# Patient Record
Sex: Female | Born: 1990
Health system: Southern US, Community
[De-identification: ages and names within clinical notes are randomized; demographics above are authoritative.]

## PROBLEM LIST (undated history)

## (undated) ENCOUNTER — Inpatient Hospital Stay (HOSPITAL_COMMUNITY): Payer: Self-pay

## (undated) DIAGNOSIS — G43909 Migraine, unspecified, not intractable, without status migrainosus: Secondary | ICD-10-CM

## (undated) DIAGNOSIS — F329 Major depressive disorder, single episode, unspecified: Secondary | ICD-10-CM

## (undated) DIAGNOSIS — R51 Headache: Secondary | ICD-10-CM

## (undated) DIAGNOSIS — D219 Benign neoplasm of connective and other soft tissue, unspecified: Secondary | ICD-10-CM

## (undated) DIAGNOSIS — R160 Hepatomegaly, not elsewhere classified: Secondary | ICD-10-CM

## (undated) DIAGNOSIS — O139 Gestational [pregnancy-induced] hypertension without significant proteinuria, unspecified trimester: Secondary | ICD-10-CM

## (undated) DIAGNOSIS — F32A Depression, unspecified: Secondary | ICD-10-CM

## (undated) DIAGNOSIS — E0500 Thyrotoxicosis with diffuse goiter without thyrotoxic crisis or storm: Secondary | ICD-10-CM

## (undated) DIAGNOSIS — R519 Headache, unspecified: Secondary | ICD-10-CM

## (undated) DIAGNOSIS — F419 Anxiety disorder, unspecified: Secondary | ICD-10-CM

## (undated) HISTORY — DX: Gestational (pregnancy-induced) hypertension without significant proteinuria, unspecified trimester: O13.9

## (undated) HISTORY — DX: Depression, unspecified: F32.A

## (undated) HISTORY — DX: Benign neoplasm of connective and other soft tissue, unspecified: D21.9

## (undated) HISTORY — PX: INDUCED ABORTION: SHX677

## (undated) HISTORY — DX: Major depressive disorder, single episode, unspecified: F32.9

## (undated) HISTORY — PX: CHOLECYSTECTOMY: SHX55

## (undated) HISTORY — DX: Anxiety disorder, unspecified: F41.9

## (undated) HISTORY — PX: RESECTION LIVER: SUR1244

## (undated) HISTORY — DX: Headache: R51

## (undated) HISTORY — DX: Hepatomegaly, not elsewhere classified: R16.0

## (undated) HISTORY — DX: Headache, unspecified: R51.9

---
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
## 2011-03-25 ENCOUNTER — Emergency Department (HOSPITAL_COMMUNITY)
Admission: EM | Admit: 2011-03-25 | Discharge: 2011-03-25 | Disposition: A | Discharge status: Home / Self Care | Payer: Medicaid - Out of State | Attending: Emergency Medicine | Admitting: Emergency Medicine

## 2011-03-25 ENCOUNTER — Encounter (HOSPITAL_COMMUNITY): Payer: Self-pay | Admitting: *Deleted

## 2011-03-25 DIAGNOSIS — K5289 Other specified noninfective gastroenteritis and colitis: Secondary | ICD-10-CM | POA: Insufficient documentation

## 2011-03-25 DIAGNOSIS — R51 Headache: Secondary | ICD-10-CM | POA: Insufficient documentation

## 2011-03-25 DIAGNOSIS — R000 Tachycardia, unspecified: Secondary | ICD-10-CM | POA: Insufficient documentation

## 2011-03-25 DIAGNOSIS — K529 Noninfective gastroenteritis and colitis, unspecified: Secondary | ICD-10-CM

## 2011-03-25 DIAGNOSIS — E860 Dehydration: Secondary | ICD-10-CM | POA: Insufficient documentation

## 2011-03-25 LAB — POCT I-STAT, CHEM 8
BUN: 7 mg/dL (ref 6–23)
Calcium, Ion: 1.21 mmol/L (ref 1.12–1.32)
HCT: 40 % (ref 36.0–46.0)
Hemoglobin: 13.6 g/dL (ref 12.0–15.0)
Sodium: 139 mEq/L (ref 135–145)
TCO2: 24 mmol/L (ref 0–100)

## 2011-03-25 LAB — URINALYSIS, ROUTINE W REFLEX MICROSCOPIC
Bilirubin Urine: NEGATIVE
Glucose, UA: NEGATIVE mg/dL
Ketones, ur: NEGATIVE mg/dL
Leukocytes, UA: NEGATIVE
Nitrite: NEGATIVE
Protein, ur: NEGATIVE mg/dL
Specific Gravity, Urine: 1.013 (ref 1.005–1.030)
Urobilinogen, UA: 0.2 mg/dL (ref 0.0–1.0)
pH: 5.5 (ref 5.0–8.0)

## 2011-03-25 LAB — URINE MICROSCOPIC-ADD ON

## 2011-03-25 LAB — POCT PREGNANCY, URINE: Preg Test, Ur: NEGATIVE

## 2011-03-25 MED ORDER — ACETAMINOPHEN 500 MG PO TABS
1000.0000 mg | ORAL_TABLET | Freq: Once | ORAL | Status: AC
Start: 2011-03-25 — End: 2011-03-25
  Administered 2011-03-25: 1000 mg via ORAL
  Filled 2011-03-25: qty 2

## 2011-03-25 MED ORDER — SODIUM CHLORIDE 0.9 % IV BOLUS (SEPSIS)
2000.0000 mL | Freq: Once | INTRAVENOUS | Status: AC
  Administered 2011-03-25: 2000 mL via INTRAVENOUS

## 2011-03-25 MED ORDER — ONDANSETRON HCL 4 MG PO TABS: 8.0000 mg | ORAL_TABLET | Freq: Two times a day (BID) | ORAL | Status: AC | PRN

## 2011-03-25 NOTE — ED Provider Notes (Signed)
History     CSN: [id number]  Arrival date & time 03/25/11  1224   First MD Initiated Contact with Patient 03/25/11 1306      Chief Complaint  Patient presents with  . Emesis  . Weakness  . Dizziness  . Headache    (Consider location/radiation/quality/duration/timing/severity/associated sxs/prior treatment) HPI Complains of vomiting and diarrhea diffuse myalgias lightheadedness onset 3 days ago. No known fever. Admits to mild headache no other complaint patient reports "I think I have a stomach virus". No treatment prior to coming here. No known fever admits to mild headache and slight diffuse crampy abdominal pain. Patient vomited one time in the emergency department not nauseated as I examine her. Vomitus is yellowish she denies blood per him    History reviewed. No pertinent past medical history. Past medical history negative History reviewed. No pertinent past surgical history.  No family history on file.  History  Substance Use Topics  . Smoking status: Never Smoker   . Smokeless tobacco: Not on file  . Alcohol Use: No    OB History    Grav Para Term Preterm Abortions TAB SAB Ect Mult Living                  Review of Systems  Constitutional: Positive for fatigue.  Respiratory: Negative.   Cardiovascular: Negative.   Gastrointestinal: Positive for nausea, vomiting, abdominal pain and diarrhea. Negative for blood in stool.  Musculoskeletal: Negative.   Skin: Negative.   Neurological: Positive for light-headedness and headaches.  Hematological: Negative.   Psychiatric/Behavioral: Negative.   All other systems reviewed and are negative.    Allergies  Review of patient's allergies indicates no known allergies.  Home Medications  No current outpatient prescriptions on file.  BP 139/66  Pulse 123  Temp(Src) 99.7 F (37.6 C) (Oral)  Resp 24  Wt 191 lb (86.637 kg)  SpO2 99%  LMP 03/22/2011  Physical Exam  Nursing note and vitals  reviewed. Constitutional: She appears well-developed and well-nourished.  HENT:  Head: Normocephalic and atraumatic.  Eyes: Conjunctivae are normal. Pupils are equal, round, and reactive to light.  Neck: Neck supple. No tracheal deviation present. No thyromegaly present.  Cardiovascular: Normal rate and regular rhythm.   No murmur heard.      Tachycardic  Pulmonary/Chest: Effort normal and breath sounds normal.  Abdominal: Soft. Bowel sounds are normal. She exhibits no distension. There is no tenderness.  Musculoskeletal: Normal range of motion. She exhibits no edema and no tenderness.  Neurological: She is alert. Coordination normal.  Skin: Skin is warm and dry. No rash noted.  Psychiatric: She has a normal mood and affect.    ED Course  Procedures (including critical care time) 4:30 PM patient alert ambulates without difficulty feels well not lightheaded on standing no longer nauseated Is able to drink without difficulty.  Labs Reviewed - No data to display No results found.   No diagnosis found. Results for orders placed during the hospital encounter of 03/25/11  URINALYSIS, ROUTINE W REFLEX MICROSCOPIC      Component Value Range   Color, Urine YELLOW  YELLOW    APPearance CLEAR  CLEAR    Specific Gravity, Urine 1.013  1.005 - 1.030    pH 5.5  5.0 - 8.0    Glucose, UA NEGATIVE  NEGATIVE (mg/dL)   Hgb urine dipstick SMALL (*) NEGATIVE    Bilirubin Urine NEGATIVE  NEGATIVE    Ketones, ur NEGATIVE  NEGATIVE (mg/dL)   Protein, ur  NEGATIVE  NEGATIVE (mg/dL)   Urobilinogen, UA 0.2  0.0 - 1.0 (mg/dL)   Nitrite NEGATIVE  NEGATIVE    Leukocytes, UA NEGATIVE  NEGATIVE   POCT I-STAT, CHEM 8      Component Value Range   Sodium 139  135 - 145 (mEq/L)   Potassium 3.8  3.5 - 5.1 (mEq/L)   Chloride 103  96 - 112 (mEq/L)   BUN 7  6 - 23 (mg/dL)   Creatinine, Ser 1.61  0.50 - 1.10 (mg/dL)   Glucose, Bld 99  70 - 99 (mg/dL)   Calcium, Ion 0.96  0.45 - 1.32 (mmol/L)   TCO2 24  0 -  100 (mmol/L)   Hemoglobin 13.6  12.0 - 15.0 (g/dL)   HCT 40.9  81.1 - 91.4 (%)  POCT PREGNANCY, URINE      Component Value Range   Preg Test, Ur NEGATIVE  NEGATIVE   URINE MICROSCOPIC-ADD ON      Component Value Range   Squamous Epithelial / LPF FEW (*) RARE    WBC, UA 0-2  <3 (WBC/hpf)   RBC / HPF 3-6  <3 (RBC/hpf)   Bacteria, UA FEW (*) RARE    Urine-Other MUCOUS PRESENT     No results found.    MDM  Resting tachycardia likely due to mild dehydration and or fever Plan prescriptions Zofran Imodium right ear as needed for diarrhea encouraged by mouth fluids; return if condition worsens or if unable to hold down fluids without vomiting Diagnosis #1 gastroenteritis #2 dehydration        Doug Sou, MD 03/25/11 [phone]

## 2011-03-25 NOTE — ED Notes (Signed)
Dr. Shela Commons at bedside speaking with pt. Reports that he is okay to discharge pt home with elevated hr.

## 2011-03-25 NOTE — Discharge Instructions (Signed)
Diet for Diarrhea, Adult Having frequent, runny stools (diarrhea) has many causes. Diarrhea may be caused or worsened by food or drink. Diarrhea may be relieved by changing your diet. IF YOU ARE NOT TOLERATING SOLID FOODS:  Drink enough water and fluids to keep your urine clear or pale yellow.   Avoid sugary drinks and sodas as well as milk-based beverages.   Avoid beverages containing caffeine and alcohol.   You may try rehydrating beverages. You can make your own by following this recipe:    tsp table salt.    tsp baking soda.   ? tsp salt substitute (potassium chloride).   1 tbs + 1 tsp sugar.   1 qt water.  As your stools become more solid, you can start eating solid foods. Add foods one at a time. If a certain food causes your diarrhea to get worse, avoid that food and try other foods. A low fiber, low-fat, and lactose-free diet is recommended. Small, frequent meals may be better tolerated.  Starches  Allowed:  White, French, and pita breads, plain rolls, buns, bagels. Plain muffins, matzo. Soda, saltine, or graham crackers. Pretzels, melba toast, zwieback. Cooked cereals made with water: cornmeal, farina, cream cereals. Dry cereals: refined corn, wheat, rice. Potatoes prepared any way without skins, refined macaroni, spaghetti, noodles, refined rice.   Avoid:  Bread, rolls, or crackers made with whole wheat, multi-grains, rye, bran seeds, nuts, or coconut. Corn tortillas or taco shells. Cereals containing whole grains, multi-grains, bran, coconut, nuts, or raisins. Cooked or dry oatmeal. Coarse wheat cereals, granola. Cereals advertised as "high-fiber." Potato skins. Whole grain pasta, wild or brown rice. Popcorn. Sweet potatoes/yams. Sweet rolls, doughnuts, waffles, pancakes, sweet breads.  Vegetables  Allowed: Strained tomato and vegetable juices. Most well-cooked and canned vegetables without seeds. Fresh: Tender lettuce, cucumber without the skin, cabbage, spinach, bean  sprouts.   Avoid: Fresh, cooked, or canned: Artichokes, baked beans, beet greens, broccoli, Brussels sprouts, corn, kale, legumes, peas, sweet potatoes. Cooked: Green or red cabbage, spinach. Avoid large servings of any vegetables, because vegetables shrink when cooked, and they contain more fiber per serving than fresh vegetables.  Fruit  Allowed: All fruit juices except prune juice. Cooked or canned: Apricots, applesauce, cantaloupe, cherries, fruit cocktail, grapefruit, grapes, kiwi, mandarin oranges, peaches, pears, plums, watermelon. Fresh: Apples without skin, ripe banana, grapes, cantaloupe, cherries, grapefruit, peaches, oranges, plums. Keep servings limited to  cup or 1 piece.   Avoid: Fresh: Apple with skin, apricots, mango, pears, raspberries, strawberries. Prune juice, stewed or dried prunes. Dried fruits, raisins, dates. Large servings of all fresh fruits.  Meat and Meat Substitutes  Allowed: Ground or well-cooked tender beef, ham, veal, lamb, pork, or poultry. Eggs, plain cheese. Fish, oysters, shrimp, lobster, other seafoods. Liver, organ meats.   Avoid: Tough, fibrous meats with gristle. Peanut butter, smooth or chunky. Cheese, nuts, seeds, legumes, dried peas, beans, lentils.  Milk  Allowed: Yogurt, lactose-free milk, kefir, drinkable yogurt, buttermilk, soy milk.   Avoid: Milk, chocolate milk, beverages made with milk, such as milk shakes.  Soups  Allowed: Bouillon, broth, or soups made from allowed foods. Any strained soup.   Avoid: Soups made from vegetables that are not allowed, cream or milk-based soups.  Desserts and Sweets  Allowed: Sugar-free gelatin, sugar-free frozen ice pops made without sugar alcohol.   Avoid: Plain cakes and cookies, pie made with allowed fruit, pudding, custard, cream pie. Gelatin, fruit, ice, sherbet, frozen ice pops. Ice cream, ice milk without nuts. Plain hard candy,   honey, jelly, molasses, syrup, sugar, chocolate syrup, gumdrops,  marshmallows.  Fats and Oils  Allowed: Avoid any fats and oils.   Avoid: Seeds, nuts, olives, avocados. Margarine, butter, cream, mayonnaise, salad oils, plain salad dressings made from allowed foods. Plain gravy, crisp bacon without rind.  Beverages  Allowed: Water, decaffeinated teas, oral rehydration solutions, sugar-free beverages.   Avoid: Fruit juices, caffeinated beverages (coffee, tea, soda or pop), alcohol, sports drinks, or lemon-lime soda or pop.  Condiments  Allowed: Ketchup, mustard, horseradish, vinegar, cream sauce, cheese sauce, cocoa powder. Spices in moderation: allspice, basil, bay leaves, celery powder or leaves, cinnamon, cumin powder, curry powder, ginger, mace, marjoram, onion or garlic powder, oregano, paprika, parsley flakes, ground pepper, rosemary, sage, savory, tarragon, thyme, turmeric.   Avoid: Coconut, honey.  Weight Monitoring: Weigh yourself every day. You should weigh yourself in the morning after you urinate and before you eat breakfast. Wear the same amount of clothing when you weigh yourself. Record your weight daily. Bring your recorded weights to your clinic visits. Tell your caregiver right away if you have gained 3 lb/1.4 kg or more in 1 day, 5 lb/2.3 kg in a week, or whatever amount you were told to report. SEEK IMMEDIATE MEDICAL CARE IF:   You are unable to keep fluids down.   You start to throw up (vomit) or diarrhea keeps coming back (persistent).   Abdominal pain develops, increases, or can be felt in one place (localizes).   You have an oral temperature above 102 F (38.9 C), not controlled by medicine.   Diarrhea contains blood or mucus.   You develop excessive weakness, dizziness, fainting, or extreme thirst.  MAKE SURE YOU:   Understand these instructions.   Will watch your condition.   Will get help right away if you are not doing well or get worse.  Document Released: 03/29/2003 Document Revised: 12/26/2010 Document Reviewed:  07/20/2008 [facility] Patient Information 2012 [place], [place].  Take Imodium right ear for diarrhea. Make sure you drink lots of fluids we feel that you're mildly dehydrated today. Return if  your condition worsens or if you're unable to hold down fluids without vomiting

## 2011-03-25 NOTE — ED Notes (Signed)
Pt states "went to [facility], been vomiting, feeling weak, having headache, dizziness x 2 days, they told me to come here & get IV fluids"

## 2011-03-25 NOTE — ED Notes (Signed)
MD at bedside. 

## 2011-03-25 NOTE — ED Notes (Signed)
Dr. Shela Commons notified of pt vomiting.

## 2011-12-19 ENCOUNTER — Encounter (HOSPITAL_COMMUNITY): Payer: Self-pay | Admitting: Emergency Medicine

## 2011-12-19 ENCOUNTER — Emergency Department (HOSPITAL_COMMUNITY): Payer: Self-pay

## 2011-12-19 ENCOUNTER — Emergency Department (HOSPITAL_COMMUNITY)
Admission: EM | Admit: 2011-12-19 | Discharge: 2011-12-19 | Disposition: A | Discharge status: Home / Self Care | Payer: Self-pay | Attending: Emergency Medicine | Admitting: Emergency Medicine

## 2011-12-19 DIAGNOSIS — S99929A Unspecified injury of unspecified foot, initial encounter: Secondary | ICD-10-CM | POA: Insufficient documentation

## 2011-12-19 DIAGNOSIS — Z87828 Personal history of other (healed) physical injury and trauma: Secondary | ICD-10-CM | POA: Insufficient documentation

## 2011-12-19 DIAGNOSIS — S8990XA Unspecified injury of unspecified lower leg, initial encounter: Secondary | ICD-10-CM | POA: Insufficient documentation

## 2011-12-19 DIAGNOSIS — Y9389 Activity, other specified: Secondary | ICD-10-CM | POA: Insufficient documentation

## 2011-12-19 DIAGNOSIS — S99912A Unspecified injury of left ankle, initial encounter: Secondary | ICD-10-CM

## 2011-12-19 DIAGNOSIS — Y929 Unspecified place or not applicable: Secondary | ICD-10-CM | POA: Insufficient documentation

## 2011-12-19 DIAGNOSIS — X58XXXA Exposure to other specified factors, initial encounter: Secondary | ICD-10-CM | POA: Insufficient documentation

## 2011-12-19 MED ORDER — OXYCODONE-ACETAMINOPHEN 5-325 MG PO TABS: 1.0000 | ORAL_TABLET | Freq: Four times a day (QID) | ORAL | Status: DC | PRN

## 2011-12-19 MED ORDER — IBUPROFEN 400 MG PO TABS
800.0000 mg | ORAL_TABLET | Freq: Once | ORAL | Status: AC
  Administered 2011-12-19: 800 mg via ORAL
  Filled 2011-12-19: qty 2

## 2011-12-19 MED ORDER — TRAMADOL HCL 50 MG PO TABS
50.0000 mg | ORAL_TABLET | Freq: Once | ORAL | Status: AC
Start: 2011-12-19 — End: 2011-12-19
  Administered 2011-12-19: 50 mg via ORAL
  Filled 2011-12-19: qty 1

## 2011-12-19 NOTE — ED Notes (Signed)
PA at bedside.

## 2011-12-19 NOTE — ED Notes (Addendum)
C/o L ankle pain and swelling for years.  States she tore ligaments in L ankle several years ago and has continued to have problems when standing on feet for a long time.  Denies recent injury. CMS intact.

## 2011-12-19 NOTE — ED Notes (Signed)
Ortho at bedside.

## 2011-12-19 NOTE — Progress Notes (Signed)
Orthopedic Tech Progress Note Patient Details:  Misty Murillo 1990/06/29 [id number]  Ortho Devices Type of Ortho Device: Ace wrap;Crutches Ortho Device/Splint Location: left ankle Ortho Device/Splint Interventions: Application   [name] 12/19/2011, 9:47 PM

## 2011-12-19 NOTE — ED Provider Notes (Signed)
History     CSN: [id number]  Arrival date & time 12/19/11  1907   First MD Initiated Contact with Patient 12/19/11 2016      Chief Complaint  Patient presents with  . Ankle Pain    (Consider location/radiation/quality/duration/timing/severity/associated sxs/prior treatment) HPI Comments: Patient presents with complaint of left ankle pain. Patient states that she hurt her ankle in middle school, approximately 10 years ago. She was told during that time that she needed surgery due to a torn ligament. Patient did not receive adequate follow up due to lack of finance. Today she states that the pain in 8.5/10 without radiation or transmission. Associated symptoms include joint swelling. Denies fever or chills.   The history is provided by the patient. No language interpreter was used.    History reviewed. No pertinent past medical history.  History reviewed. No pertinent past surgical history.  No family history on file.  History  Substance Use Topics  . Smoking status: Never Smoker   . Smokeless tobacco: Not on file  . Alcohol Use: No    OB History    Grav Para Term Preterm Abortions TAB SAB Ect Mult Living                  Review of Systems  Constitutional: Negative for fever and chills.  Musculoskeletal: Positive for joint swelling and arthralgias.    Allergies  Review of patient's allergies indicates no known allergies.  Home Medications  No current outpatient prescriptions on file.  BP 158/83  Pulse 107  Temp 98.7 F (37.1 C) (Oral)  Resp 18  SpO2 98%  LMP 12/16/2011  Physical Exam  Nursing note and vitals reviewed. Constitutional: She appears well-developed and well-nourished.  HENT:  Head: Normocephalic and atraumatic.  Mouth/Throat: Oropharynx is clear and moist.  Eyes: Conjunctivae normal and EOM are normal. No scleral icterus.  Neck: Normal range of motion. Neck supple.  Cardiovascular: Normal rate, regular rhythm and normal heart sounds.     Pulmonary/Chest: Effort normal and breath sounds normal.  Abdominal: Soft. Bowel sounds are normal. There is no tenderness.  Musculoskeletal: She exhibits edema and tenderness.       Patient unable to perform flexion or dorsiflexion. Marked swelling at the medial malleolus. Strong pedal pulse. Good cap refill.  Neurological: She is alert.  Skin: Skin is warm and dry.    ED Course  Procedures (including critical care time)  Labs Reviewed - No data to display Dg Ankle Complete Left  12/19/2011  *RADIOLOGY REPORT*  Clinical Data: Chronic left ankle pain and deformity from a remote injury many years ago.  LEFT ANKLE COMPLETE - 3+ VIEW  Comparison: None.  Findings: No evidence of acute or subacute fracture or dislocation. Ankle mortise intact with well-preserved joint space.  No intrinsic osseous abnormality.  Calcification or ossification adjacent to the lateral talus is likely dystrophic and related to the prior injury.  IMPRESSION: No acute or subacute osseous abnormality.   Original Report Authenticated By: Hulan Saas, M.D.      1. Left ankle injury       MDM  Patient presented with left ankle pain s/p athletic injury 10 years ago. Imaging unremarkable. Patient given ace wrap, pain medication, instructions on rest, ice, elevation. Discharged with return precautions. No red flags for fracture, dislocation, or subluxation.         Pixie Casino, PA-C 12/19/11 2214

## 2011-12-20 NOTE — ED Provider Notes (Signed)
Medical screening examination/treatment/procedure(s) were performed by non-physician practitioner and as supervising physician I was immediately available for consultation/collaboration.   Carleene Cooper III, MD 12/20/11 [phone]

## 2012-02-14 ENCOUNTER — Inpatient Hospital Stay (HOSPITAL_COMMUNITY): Payer: Self-pay

## 2012-02-14 ENCOUNTER — Inpatient Hospital Stay (HOSPITAL_COMMUNITY)
Admission: AD | Admit: 2012-02-14 | Discharge: 2012-02-14 | Disposition: A | Discharge status: Home / Self Care | Payer: Self-pay | Source: Ambulatory Visit | Attending: Obstetrics and Gynecology | Admitting: Obstetrics and Gynecology

## 2012-02-14 ENCOUNTER — Encounter (HOSPITAL_COMMUNITY): Payer: Self-pay | Admitting: *Deleted

## 2012-02-14 DIAGNOSIS — Z3490 Encounter for supervision of normal pregnancy, unspecified, unspecified trimester: Secondary | ICD-10-CM

## 2012-02-14 DIAGNOSIS — O418X90 Other specified disorders of amniotic fluid and membranes, unspecified trimester, not applicable or unspecified: Secondary | ICD-10-CM

## 2012-02-14 DIAGNOSIS — O2390 Unspecified genitourinary tract infection in pregnancy, unspecified trimester: Secondary | ICD-10-CM | POA: Insufficient documentation

## 2012-02-14 DIAGNOSIS — B9689 Other specified bacterial agents as the cause of diseases classified elsewhere: Secondary | ICD-10-CM | POA: Insufficient documentation

## 2012-02-14 DIAGNOSIS — O209 Hemorrhage in early pregnancy, unspecified: Secondary | ICD-10-CM | POA: Insufficient documentation

## 2012-02-14 DIAGNOSIS — N760 Acute vaginitis: Secondary | ICD-10-CM | POA: Insufficient documentation

## 2012-02-14 DIAGNOSIS — A499 Bacterial infection, unspecified: Secondary | ICD-10-CM | POA: Insufficient documentation

## 2012-02-14 LAB — CBC WITH DIFFERENTIAL/PLATELET
Eosinophils Absolute: 0.1 10*3/uL (ref 0.0–0.7)
Eosinophils Relative: 1 % (ref 0–5)
Hemoglobin: 10.3 g/dL — ABNORMAL LOW (ref 12.0–15.0)
Lymphs Abs: 1.7 10*3/uL (ref 0.7–4.0)
MCH: 25.6 pg — ABNORMAL LOW (ref 26.0–34.0)
MCHC: 35.3 g/dL (ref 30.0–36.0)
MCV: 72.5 fL — ABNORMAL LOW (ref 78.0–100.0)
Monocytes Relative: 8 % (ref 3–12)
Platelets: 247 10*3/uL (ref 150–400)
RBC: 4.03 MIL/uL (ref 3.87–5.11)

## 2012-02-14 LAB — URINALYSIS, ROUTINE W REFLEX MICROSCOPIC
Specific Gravity, Urine: 1.015 (ref 1.005–1.030)
Urobilinogen, UA: 0.2 mg/dL (ref 0.0–1.0)
pH: 5.5 (ref 5.0–8.0)

## 2012-02-14 LAB — WET PREP, GENITAL
Trich, Wet Prep: NONE SEEN
Yeast Wet Prep HPF POC: NONE SEEN

## 2012-02-14 LAB — URINE MICROSCOPIC-ADD ON

## 2012-02-14 LAB — POCT PREGNANCY, URINE: Preg Test, Ur: POSITIVE — AB

## 2012-02-14 MED ORDER — METRONIDAZOLE 500 MG PO TABS: 500.0000 mg | ORAL_TABLET | Freq: Two times a day (BID) | ORAL | Status: DC

## 2012-02-14 NOTE — MAU Provider Note (Signed)
History     CSN: [id number]  Arrival date and time: 02/14/12 1203   None     Chief Complaint  Patient presents with  . Vaginal Bleeding   HPIBryana RENEKA Murillo is 22 y.o. G2P1001 [redacted]w[redacted]d weeks presenting with vaginal bleeding for 1 week with only 1 clot (last week). She states the bleeding is now dark brown ? Tissue and sometimes there is a yellow mucous.  Reports cramping that at its worse 7-8/10 and now 0/10.  LMP 12/04/11.  Had U/S at [facility] 3-4 weeks ago and they did not see embryo at that time.  1 sexual partner X 1 year.     History reviewed. No pertinent past medical history.  History reviewed. No pertinent past surgical history.  History reviewed. No pertinent family history.  History  Substance Use Topics  . Smoking status: Never Smoker   . Smokeless tobacco: Not on file  . Alcohol Use: No    Allergies: No Known Allergies  Prescriptions prior to admission  Medication Sig Dispense Refill  . Aspirin-Acetaminophen-Caffeine (GOODY HEADACHE PO) Take 1 packet by mouth daily as needed. For migraine        Review of Systems  Constitutional: Negative for fever and chills.  Gastrointestinal: Positive for abdominal pain. Negative for nausea and vomiting.  Genitourinary:       + for vaginal bleeding  Neurological: Negative for headaches.   Physical Exam   Blood pressure 153/57, pulse 98, temperature 98.2 F (36.8 C), temperature source Oral, resp. rate 18, height 5\' 4"  (1.626 m), weight 195 lb (88.451 kg), last menstrual period 12/04/2011, unknown if currently breastfeeding.  Physical Exam  Vitals reviewed. Constitutional: She is oriented to person, place, and time. She appears well-developed and well-nourished. No distress.  HENT:  Head: Normocephalic.  Neck: Normal range of motion.  Cardiovascular: Normal rate.   Respiratory: Effort normal.  GI: Soft. She exhibits no distension and no mass. There is no tenderness. There is no rebound and no  guarding.  Genitourinary: There is no rash, tenderness or lesion on the right labia. There is no rash, tenderness or lesion on the left labia. Uterus is enlarged (slightly). Uterus is not tender. Cervix exhibits no discharge and no friability. Right adnexum displays no mass, no tenderness and no fullness. Left adnexum displays no mass, no tenderness and no fullness. No bleeding around the vagina. Vaginal discharge (yellowish with odor) found.  Neurological: She is alert and oriented to person, place, and time.  Skin: Skin is warm and dry.  Psychiatric: She has a normal mood and affect. Her behavior is normal.   Clinical Data: 22 year old G2 P1, unknown LMP, presenting with  pelvic cramping and vaginal bleeding. Quantitative beta HCG  49,447.  OBSTETRIC <14 WK ULTRASOUND  Technique: Transabdominal ultrasound was performed for evaluation  of the gestation as well as the maternal uterus and adnexal  regions.  Comparison: None.  Intrauterine gestational sac: Single, normal in shape.  Yolk sac: Visualized.  Embryo: Visualized.  Cardiac Activity: Visualized.  Heart Rate: 179 bpm  CRL: 24.4 mm 9 w 2 d [place] EDC: 09/16/2012.  Maternal uterus/Adnexae:  Small subchorionic hemorrhage which measures approximately 1.8 x  0.8 x 1.7 cm. Normal-appearing right ovary which measures  approximately 4.3 x 2.7 x 3.8 cm, containing a simple corpus luteum  cyst which approximates 2.5 cm. Normal-appearing left ovary  measuring approximately 2.7 x 2.1 x 1.9 cm. No adnexal masses or  free pelvic fluid.  IMPRESSION:  1. Single  live intrauterine embryo with estimated gestational age  [redacted] weeks 2 days by crown-rump length. Ultrasound [facility] 09/08/2012.  2. Small subchorionic hemorrhage.  3. Approximate 2.5 cm right ovary corpus luteum cyst.  Original Report Authenticated By: Hulan Saas, M.D.    Results for orders placed during the hospital encounter of 02/14/12 (from the past 24 hour(s))  URINALYSIS, ROUTINE W  REFLEX MICROSCOPIC     Status: Abnormal   Collection Time   02/14/12 12:20 PM      Component Value Range   Color, Urine YELLOW  YELLOW   APPearance CLEAR  CLEAR   Specific Gravity, Urine 1.015  1.005 - 1.030   pH 5.5  5.0 - 8.0   Glucose, UA NEGATIVE  NEGATIVE mg/dL   Hgb urine dipstick LARGE (*) NEGATIVE   Bilirubin Urine NEGATIVE  NEGATIVE   Ketones, ur NEGATIVE  NEGATIVE mg/dL   Protein, ur NEGATIVE  NEGATIVE mg/dL   Urobilinogen, UA 0.2  0.0 - 1.0 mg/dL   Nitrite NEGATIVE  NEGATIVE   Leukocytes, UA TRACE (*) NEGATIVE  URINE MICROSCOPIC-ADD ON     Status: Abnormal   Collection Time   02/14/12 12:20 PM      Component Value Range   Squamous Epithelial / LPF FEW (*) RARE   WBC, UA 3-6  <3 WBC/hpf   RBC / HPF 3-6  <3 RBC/hpf   Bacteria, UA MANY (*) RARE  POCT PREGNANCY, URINE     Status: Abnormal   Collection Time   02/14/12 12:30 PM      Component Value Range   Preg Test, Ur POSITIVE (*) NEGATIVE  CBC WITH DIFFERENTIAL     Status: Abnormal   Collection Time   02/14/12 12:46 PM      Component Value Range   WBC 7.1  4.0 - 10.5 K/uL   RBC 4.03  3.87 - 5.11 MIL/uL   Hemoglobin 10.3 (*) 12.0 - 15.0 g/dL   HCT 11.9 (*) 14.7 - 82.9 %   MCV 72.5 (*) 78.0 - 100.0 fL   MCH 25.6 (*) 26.0 - 34.0 pg   MCHC 35.3  30.0 - 36.0 g/dL   RDW 56.2  13.0 - 86.5 %   Platelets 247  150 - 400 K/uL   Neutrophils Relative 66  43 - 77 %   Neutro Abs 4.7  1.7 - 7.7 K/uL   Lymphocytes Relative 25  12 - 46 %   Lymphs Abs 1.7  0.7 - 4.0 K/uL   Monocytes Relative 8  3 - 12 %   Monocytes Absolute 0.6  0.1 - 1.0 K/uL   Eosinophils Relative 1  0 - 5 %   Eosinophils Absolute 0.1  0.0 - 0.7 K/uL   Basophils Relative 0  0 - 1 %   Basophils Absolute 0.0  0.0 - 0.1 K/uL   Smear Review MORPHOLOGY UNREMARKABLE    HCG, QUANTITATIVE, PREGNANCY     Status: Abnormal   Collection Time   02/14/12 12:48 PM      Component Value Range   hCG, Beta Chain, Quant, S 49447 (*) <5 mIU/mL  WET PREP, GENITAL     Status:  Abnormal   Collection Time   02/14/12  1:10 PM      Component Value Range   Yeast Wet Prep HPF POC NONE SEEN  NONE SEEN   Trich, Wet Prep NONE SEEN  NONE SEEN   Clue Cells Wet Prep HPF POC MANY (*) NONE SEEN   WBC, Wet Prep HPF  POC FEW (*) NONE SEEN   MAU Course  Procedures  GC.CHL cultures to lab  MDM Reviewed lab and ultrasound findings with the patient.  Informed she is 9wk2days by dates  Assessment and Plan  A:  Intrauterine pregnancy at [redacted]w[redacted]d gestation      Vaginal bleeding      Small subchorionic hemorrhage     Bacterial vaginosis  P:  Pelvic rest until bleeding completely stops      Rx for Flagyl to pharmacy     Begin prenatal care with doctor of her choice--Dr. Nolberto Hanlon 02/14/2012, 1:00 PM

## 2012-02-14 NOTE — MAU Note (Signed)
Pt states she is having some vaginal bleeding since last week and is [redacted] weeks pregnant from her last period. States bleeding is more like a period today.

## 2012-02-14 NOTE — MAU Provider Note (Signed)
Attestation of Attending Supervision of Advanced Practitioner (CNM/NP): Evaluation and management procedures were performed by the Advanced Practitioner under my supervision and collaboration.  I have reviewed the Advanced Practitioner's note and chart, and I agree with the management and plan.  Kashish Yglesias 02/14/2012 9:21 PM

## 2012-02-15 LAB — URINE CULTURE

## 2012-04-21 ENCOUNTER — Encounter (HOSPITAL_COMMUNITY): Payer: Self-pay | Admitting: *Deleted

## 2012-04-21 ENCOUNTER — Emergency Department (HOSPITAL_COMMUNITY)
Admission: EM | Admit: 2012-04-21 | Discharge: 2012-04-21 | Disposition: A | Discharge status: Home / Self Care | Payer: Self-pay | Attending: Emergency Medicine | Admitting: Emergency Medicine

## 2012-04-21 DIAGNOSIS — T7840XA Allergy, unspecified, initial encounter: Secondary | ICD-10-CM

## 2012-04-21 DIAGNOSIS — R21 Rash and other nonspecific skin eruption: Secondary | ICD-10-CM | POA: Insufficient documentation

## 2012-04-21 MED ORDER — PREDNISONE 20 MG PO TABS
60.0000 mg | ORAL_TABLET | Freq: Every day | ORAL | Status: DC
  Administered 2012-04-21: 60 mg via ORAL
  Filled 2012-04-21: qty 3

## 2012-04-21 MED ORDER — HYDROXYZINE HCL 25 MG PO TABS: 25.0000 mg | ORAL_TABLET | Freq: Four times a day (QID) | ORAL | Status: DC

## 2012-04-21 MED ORDER — PREDNISONE 10 MG PO TABS: 20.0000 mg | ORAL_TABLET | Freq: Every day | ORAL | Status: DC

## 2012-04-21 NOTE — ED Provider Notes (Signed)
History     CSN: [id number]  Arrival date & time 04/21/12  1933   First MD Initiated Contact with Patient 04/21/12 2008      Chief Complaint  Patient presents with  . Allergic Reaction    (Consider location/radiation/quality/duration/timing/severity/associated sxs/prior treatment) Patient is a 22 y.o. female presenting with allergic reaction. The history is provided by the patient.  Allergic Reaction   patient here with 2 weeks of pruritic rash with no preceding event. Denies any dyspnea or trouble breathing. Has been using Benadryl without relief. Has had this multiple times without a definitive cause. No new chemical exposures. Systems have been persistent and nothing makes them worse  History reviewed. No pertinent past medical history.  History reviewed. No pertinent past surgical history.  History reviewed. No pertinent family history.  History  Substance Use Topics  . Smoking status: Never Smoker   . Smokeless tobacco: Not on file  . Alcohol Use: Yes    OB History   Grav Para Term Preterm Abortions TAB SAB Ect Mult Living   2 1 1  0 0 0 0 0 0 1      Review of Systems  All other systems reviewed and are negative.    Allergies  Review of patient's allergies indicates no known allergies.  Home Medications   Current Outpatient Rx  Name  Route  Sig  Dispense  Refill  . diphenhydrAMINE (BENADRYL) 12.5 MG/5ML liquid   Oral   Take 25 mg by mouth as needed for itching or allergies.           BP 144/76  Pulse 106  Temp(Src) 98.6 F (37 C) (Oral)  Resp 18  SpO2 100%  LMP 04/16/2012  Breastfeeding? Unknown  Physical Exam  Nursing note and vitals reviewed. Constitutional: She is oriented to person, place, and time. She appears well-developed and well-nourished.  Non-toxic appearance. No distress.  HENT:  Head: Normocephalic and atraumatic.  Eyes: Conjunctivae, EOM and lids are normal. Pupils are equal, round, and reactive to light.  Neck: Normal range  of motion. Neck supple. No tracheal deviation present. No mass present.  Cardiovascular: Normal rate, regular rhythm and normal heart sounds.  Exam reveals no gallop.   No murmur heard. Pulmonary/Chest: Effort normal and breath sounds normal. No stridor. No respiratory distress. She has no decreased breath sounds. She has no wheezes. She has no rhonchi. She has no rales.  Abdominal: Soft. Normal appearance and bowel sounds are normal. She exhibits no distension. There is no tenderness. There is no rebound and no CVA tenderness.  Musculoskeletal: Normal range of motion. She exhibits no edema and no tenderness.  Neurological: She is alert and oriented to person, place, and time. She has normal strength. No cranial nerve deficit or sensory deficit. GCS eye subscore is 4. GCS verbal subscore is 5. GCS motor subscore is 6.  Skin: Skin is warm and dry. No abrasion and no rash noted.  Psychiatric: She has a normal mood and affect. Her speech is normal and behavior is normal.    ED Course  Procedures (including critical care time)  Labs Reviewed - No data to display No results found.   No diagnosis found.    MDM  Patient given prednisone here and will be placed on a prednisone taper and given referral to an allergist        Toy Baker, MD 04/21/12 2035

## 2012-04-21 NOTE — ED Notes (Signed)
Pt c/o a itchy rash starting 2 weeks ago, pt reports hx of the same that she experiences every summer. Pt states she gets big red itchy bumps all over including her lips and eyes. Pt's airway is intact, no resp distress noted, sats 100% ra

## 2012-04-21 NOTE — ED Notes (Signed)
Pt c/o itchy rash that began 2 weeks ago, states it is an "allergic reaction that happens only in the summer".  States that before she has had her eyes swell shut and lips swell.  No airway obstruction at this time, pt alert and oriented, speaking in full sentences.

## 2012-04-21 NOTE — ED Notes (Addendum)
Pt dc'd home w/all belongings, alert and ambulatory upon dc, pt verbalizes understanding of dc instructions 

## 2012-07-23 ENCOUNTER — Encounter (HOSPITAL_COMMUNITY): Payer: Self-pay | Admitting: *Deleted

## 2012-07-23 ENCOUNTER — Emergency Department (HOSPITAL_COMMUNITY)
Admission: EM | Admit: 2012-07-23 | Discharge: 2012-07-23 | Discharge status: Against Medical Advice | Payer: Self-pay | Attending: Emergency Medicine | Admitting: Emergency Medicine

## 2012-07-23 DIAGNOSIS — S0993XA Unspecified injury of face, initial encounter: Secondary | ICD-10-CM | POA: Insufficient documentation

## 2012-07-23 DIAGNOSIS — S0510XA Contusion of eyeball and orbital tissues, unspecified eye, initial encounter: Secondary | ICD-10-CM | POA: Insufficient documentation

## 2012-07-23 DIAGNOSIS — S199XXA Unspecified injury of neck, initial encounter: Secondary | ICD-10-CM | POA: Insufficient documentation

## 2012-07-23 MED ORDER — OXYCODONE-ACETAMINOPHEN 5-325 MG PO TABS: 2.0000 | ORAL_TABLET | Freq: Once | ORAL | Status: DC
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
## 2012-07-24 NOTE — ED Provider Notes (Signed)
Medical screening examination/treatment/procedure(s) were performed by non-physician practitioner and as supervising physician I was immediately available for consultation/collaboration.    Vida Roller, MD 07/24/12 [phone]

## 2013-01-10 ENCOUNTER — Emergency Department (HOSPITAL_COMMUNITY)
Admission: EM | Admit: 2013-01-10 | Discharge: 2013-01-11 | Disposition: A | Discharge status: Home / Self Care | Payer: Medicaid - Out of State | Attending: Emergency Medicine | Admitting: Emergency Medicine

## 2013-01-10 ENCOUNTER — Encounter (HOSPITAL_COMMUNITY): Payer: Self-pay | Admitting: Emergency Medicine

## 2013-01-10 DIAGNOSIS — Y929 Unspecified place or not applicable: Secondary | ICD-10-CM | POA: Insufficient documentation

## 2013-01-10 DIAGNOSIS — T161XXA Foreign body in right ear, initial encounter: Secondary | ICD-10-CM

## 2013-01-10 DIAGNOSIS — IMO0002 Reserved for concepts with insufficient information to code with codable children: Secondary | ICD-10-CM | POA: Insufficient documentation

## 2013-01-10 DIAGNOSIS — T169XXA Foreign body in ear, unspecified ear, initial encounter: Secondary | ICD-10-CM | POA: Insufficient documentation

## 2013-01-10 DIAGNOSIS — Y9389 Activity, other specified: Secondary | ICD-10-CM | POA: Insufficient documentation

## 2013-01-11 NOTE — ED Provider Notes (Signed)
CSN: [id number]     Arrival date & time 01/10/13  2342 History   First MD Initiated Contact with Patient 01/11/13 0013     Chief Complaint  Patient presents with  . Foreign Body in Ear   (Consider location/radiation/quality/duration/timing/severity/associated sxs/prior Treatment) HPI  22 year old female presents for evaluation of retain fb in R ear.  Pt report using qtip to clean ear on a regular basis.  Tonight while cleaning the head of the qtip broke off and stuck in her R ear canal.  Incident happended 1 hr ago.  Denies pain, discharge, hearing changes.  No other complaint.    History reviewed. No pertinent past medical history. History reviewed. No pertinent past surgical history. History reviewed. No pertinent family history. History  Substance Use Topics  . Smoking status: Never Smoker   . Smokeless tobacco: Not on file  . Alcohol Use: Yes   OB History   Grav Para Term Preterm Abortions TAB SAB Ect Mult Living   2 1 1  0 0 0 0 0 0 1     Review of Systems  Constitutional: Negative for fever.  HENT: Negative for ear discharge and ear pain.   Skin: Negative for rash and wound.  Neurological: Negative for numbness.    Allergies  Review of patient's allergies indicates no known allergies.  Home Medications  No current outpatient prescriptions on file. BP 135/75  Pulse 96  Temp(Src) 97.1 F (36.2 C) (Oral)  Resp 18  Wt 220 lb (99.791 kg)  SpO2 100%  LMP 12/20/2012 Physical Exam  Nursing note and vitals reviewed. Constitutional: She appears well-developed and well-nourished. No distress.  HENT:  Head: Atraumatic.  Left Ear: External ear normal.  R ear: head of q-tip visualized within ear canal.  Ear lobe nontender.    Eyes: Conjunctivae are normal.  Neck: Neck supple.  Neurological: She is alert.  Skin: No rash noted.  Psychiatric: She has a normal mood and affect.    ED Course  FOREIGN BODY REMOVAL Date/Time: 01/11/2013 12:41 AM Performed by: Fayrene Helper Authorized by: Fayrene Helper Consent: Verbal consent obtained. written consent not obtained. Risks and benefits: risks, benefits and alternatives were discussed Consent given by: patient Patient understanding: patient states understanding of the procedure being performed Patient consent: the patient's understanding of the procedure matches consent given Patient identity confirmed: verbally with patient and arm band Time out: Immediately prior to procedure a "time out" was called to verify the correct patient, procedure, equipment, support staff and site/side marked as required. Body area: ear Location details: right ear Patient sedated: no Patient restrained: no Patient cooperative: yes Localization method: ENT speculum, magnification and visualized Removal mechanism: forceps Complexity: simple 1 objects recovered. Objects recovered: head of q-tip Post-procedure assessment: foreign body removed Patient tolerance: Patient tolerated the procedure well with no immediate complications.   (including critical care time)  12:40 AM Retained q-tip head in R ear canal, successfully removed by me.  Pt tolerates well.  TM is intact.      Labs Review Labs Reviewed - No data to display Imaging Review No results found.  EKG Interpretation   None       MDM   1. Foreign body of right ear, initial encounter    BP 135/75  Pulse 96  Temp(Src) 97.1 F (36.2 C) (Oral)  Resp 18  Wt 220 lb (99.791 kg)  SpO2 100%  LMP 12/20/2012     Fayrene Helper, PA-C 01/11/13 [phone]

## 2013-01-11 NOTE — ED Provider Notes (Signed)
Medical screening examination/treatment/procedure(s) were performed by non-physician practitioner and as supervising physician I was immediately available for consultation/collaboration.  EKG Interpretation   None         Enid Skeens, MD 01/11/13 [phone]

## 2013-01-11 NOTE — ED Notes (Signed)
Pt sts she was cleaning her ears with a q-tip when she felt it break in her right ear.  White q-tip noted in right ear during assessment.  Pt denies any other complaints.

## 2013-01-17 ENCOUNTER — Emergency Department (HOSPITAL_COMMUNITY)
Admission: EM | Admit: 2013-01-17 | Discharge: 2013-01-17 | Discharge status: Against Medical Advice | Payer: Medicaid - Out of State | Attending: Emergency Medicine | Admitting: Emergency Medicine

## 2013-01-17 ENCOUNTER — Encounter (HOSPITAL_COMMUNITY): Payer: Self-pay | Admitting: Emergency Medicine

## 2013-01-17 DIAGNOSIS — Z5321 Procedure and treatment not carried out due to patient leaving prior to being seen by health care provider: Secondary | ICD-10-CM

## 2013-01-17 DIAGNOSIS — R52 Pain, unspecified: Secondary | ICD-10-CM | POA: Insufficient documentation

## 2013-01-17 DIAGNOSIS — R1110 Vomiting, unspecified: Secondary | ICD-10-CM | POA: Insufficient documentation

## 2013-01-17 DIAGNOSIS — J3489 Other specified disorders of nose and nasal sinuses: Secondary | ICD-10-CM | POA: Insufficient documentation

## 2013-01-17 NOTE — ED Notes (Signed)
Pt called from waiting room for a room in the back with no response.

## 2013-01-17 NOTE — ED Notes (Signed)
Pt presents with c/o flu-like symptoms. Pt says she began feeling bad on 12/22. Pt presents with chills, body aches, difficulty breathing, congestion, and vomiting. Pt says the vomiting began last night. Pt unsure of whether she has had a fever or not. Pt is no acute distress at this time.

## 2013-01-27 NOTE — ED Provider Notes (Signed)
CSN: [id number]     Arrival date & time 01/17/13  1424 History   First MD Initiated Contact with Patient 01/17/13 1714     Chief Complaint  Patient presents with  . Influenza   (Consider location/radiation/quality/duration/timing/severity/associated sxs/prior Treatment) HPI Comments: Patient left prior to being seen    Patient is a 23 y.o. female presenting with flu symptoms.  Influenza   History reviewed. No pertinent past medical history. History reviewed. No pertinent past surgical history. No family history on file. History  Substance Use Topics  . Smoking status: Never Smoker   . Smokeless tobacco: Not on file  . Alcohol Use: Yes     Comment: rarely    OB History   Grav Para Term Preterm Abortions TAB SAB Ect Mult Living   2 1 1  0 0 0 0 0 0 1     Review of Systems  Unable to perform ROS: Other    Allergies  Review of patient's allergies indicates no known allergies.  Home Medications  No current outpatient prescriptions on file. BP 157/95  Pulse 113  Temp(Src) 97.8 F (36.6 C) (Oral)  Resp 18  SpO2 98%  LMP 12/20/2012 Physical Exam  ED Course  Procedures (including critical care time) Labs Review Labs Reviewed - No data to display Imaging Review No results found.  EKG Interpretation   None       MDM   1. Patient left without being seen    Patient was not in room when entered. Asked nursing staff about patient. Patient was searched for but nowhere to be found.     Sherrie George, PA-C 01/27/13 1357

## 2013-04-23 ENCOUNTER — Emergency Department (HOSPITAL_COMMUNITY)
Admission: EM | Admit: 2013-04-23 | Discharge: 2013-04-24 | Disposition: A | Discharge status: Home / Self Care | Payer: Medicaid Other | Attending: Emergency Medicine | Admitting: Emergency Medicine

## 2013-04-23 ENCOUNTER — Encounter (HOSPITAL_COMMUNITY): Payer: Self-pay | Admitting: Emergency Medicine

## 2013-04-23 ENCOUNTER — Emergency Department (HOSPITAL_COMMUNITY): Payer: Medicaid Other

## 2013-04-23 ENCOUNTER — Emergency Department (HOSPITAL_COMMUNITY)
Admission: EM | Admit: 2013-04-23 | Discharge: 2013-04-23 | Discharge status: Against Medical Advice | Payer: Medicaid Other | Attending: Emergency Medicine | Admitting: Emergency Medicine

## 2013-04-23 DIAGNOSIS — S63502A Unspecified sprain of left wrist, initial encounter: Secondary | ICD-10-CM

## 2013-04-23 DIAGNOSIS — Y929 Unspecified place or not applicable: Secondary | ICD-10-CM | POA: Insufficient documentation

## 2013-04-23 DIAGNOSIS — Y939 Activity, unspecified: Secondary | ICD-10-CM | POA: Insufficient documentation

## 2013-04-23 DIAGNOSIS — M25539 Pain in unspecified wrist: Secondary | ICD-10-CM | POA: Insufficient documentation

## 2013-04-23 DIAGNOSIS — S63509A Unspecified sprain of unspecified wrist, initial encounter: Secondary | ICD-10-CM | POA: Insufficient documentation

## 2013-04-23 DIAGNOSIS — X58XXXA Exposure to other specified factors, initial encounter: Secondary | ICD-10-CM | POA: Insufficient documentation

## 2013-04-23 MED ORDER — OXYCODONE-ACETAMINOPHEN 5-325 MG PO TABS
1.0000 | ORAL_TABLET | Freq: Once | ORAL | Status: AC
  Administered 2013-04-23: 1 via ORAL
  Filled 2013-04-23: qty 1

## 2013-04-23 NOTE — ED Notes (Signed)
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

## 2013-04-23 NOTE — ED Notes (Signed)
Patient vague in regards to how she hurt her left wrist.  States she could have possibly fallen out of the bed and landed on her hand.

## 2013-04-23 NOTE — ED Provider Notes (Signed)
CSN: [id number]     Arrival date & time 04/23/13  2256 History   First MD Initiated Contact with Patient 04/23/13 2317     Chief Complaint  Patient presents with  . Wrist Pain     (Consider location/radiation/quality/duration/timing/severity/associated sxs/prior Treatment) Patient is a 23 y.o. female presenting with wrist pain. The history is provided by the patient. No language interpreter was used.  Wrist Pain This is a new problem. The current episode started yesterday. Pertinent negatives include no chills, fever, neck pain or numbness. Associated symptoms comments: Left wrist pain without known injury since yesterday. Pain extends from mid-forearm to hand with associated swelling, with greatest pain at dorsal, radial wrist. No numbness or tingling. No neck pain. Marland Kitchen    History reviewed. No pertinent past medical history. History reviewed. No pertinent past surgical history. No family history on file. History  Substance Use Topics  . Smoking status: Never Smoker   . Smokeless tobacco: Not on file  . Alcohol Use: Yes     Comment: rarely    OB History   Grav Para Term Preterm Abortions TAB SAB Ect Mult Living   2 1 1  0 0 0 0 0 0 1     Review of Systems  Constitutional: Negative for fever and chills.  Musculoskeletal: Negative for neck pain.       See HPI.  Skin: Negative.   Neurological: Negative.  Negative for numbness.      Allergies  Review of patient's allergies indicates no known allergies.  Home Medications  No current outpatient prescriptions on file. BP 159/82  Pulse 113  Temp(Src) 98.4 F (36.9 C) (Oral)  Resp 18  Ht 5\' 4"  (1.626 m)  Wt 219 lb (99.338 kg)  BMI 37.57 kg/m2  SpO2 99%  LMP 03/26/2013 Physical Exam  Constitutional: She is oriented to person, place, and time. She appears well-developed and well-nourished.  Neck: Normal range of motion.  Cardiovascular: Intact distal pulses.   Pulmonary/Chest: Effort normal.  Musculoskeletal:  Swelling  of left hand dorsally. No discoloration or bony deformity. FROM digits 2-5 with ROM limited by pain of thumb. Tender to radial wrist. No warmth or redness. Elbow, upper extremity nontender without swelling.   Neurological: She is alert and oriented to person, place, and time.  Skin: Skin is warm and dry.    ED Course  Procedures (including critical care time) Labs Review Labs Reviewed - No data to display Imaging Review No results found.   EKG Interpretation None      MDM   Final diagnoses:  None   1. Left wrist sprain  Uncomplicated wrist sprain injury.     Dewaine Oats, PA-C 04/24/13 [phone]

## 2013-04-23 NOTE — ED Notes (Signed)
Pt not in room x 3  

## 2013-04-23 NOTE — ED Notes (Signed)
Pt not in room and appears to have left AMA

## 2013-04-23 NOTE — ED Notes (Signed)
+   pulses bilaterally

## 2013-04-23 NOTE — ED Notes (Signed)
Pt. reports left wrist pain and left hand pain onset yesterday denies injury .

## 2013-04-23 NOTE — ED Notes (Signed)
Pt not in room x 2

## 2013-04-23 NOTE — ED Notes (Signed)
Pt not in room.

## 2013-04-24 MED ORDER — IBUPROFEN 800 MG PO TABS: 800.0000 mg | ORAL_TABLET | Freq: Three times a day (TID) | ORAL | Status: DC

## 2013-04-24 MED ORDER — OXYCODONE-ACETAMINOPHEN 5-325 MG PO TABS: 1.0000 | ORAL_TABLET | ORAL | Status: DC | PRN

## 2013-04-24 NOTE — Discharge Instructions (Signed)
Joint Sprain A sprain is a tear or stretch in the ligaments that hold a joint together. Severe sprains may need as long as 3-6 weeks of immobilization and/or exercises to heal completely. Sprained joints should be rested and protected. If not, they can become unstable and prone to re-injury. Proper treatment can reduce your pain, shorten the period of disability, and reduce the risk of repeated injuries. TREATMENT   Rest and elevate the injured joint to reduce pain and swelling.  Apply ice packs to the injury for 20-30 minutes every 2-3 hours for the next 2-3 days.  Keep the injury wrapped in a compression bandage or splint as long as the joint is painful or as instructed by your caregiver.  Do not use the injured joint until it is completely healed to prevent re-injury and chronic instability. Follow the instructions of your caregiver.  Long-term sprain management may require exercises and/or treatment by a physical therapist. Taping or special braces may help stabilize the joint until it is completely better. SEEK MEDICAL CARE IF:   You develop increased pain or swelling of the joint.  You develop increasing redness and warmth of the joint.  You develop a fever.  It becomes stiff.  Your hand or foot gets cold or numb. Document Released: 02/14/2004 Document Revised: 03/31/2011 Document Reviewed: 01/24/2008 [facility] [employer] Patient Information 2014 [place], [place].  Cryotherapy Cryotherapy means treatment with cold. Ice or gel packs can be used to reduce both pain and swelling. Ice is the most helpful within the first 24 to 48 hours after an injury or flareup from overusing a muscle or joint. Sprains, strains, spasms, burning pain, shooting pain, and aches can all be eased with ice. Ice can also be used when recovering from surgery. Ice is effective, has very few side effects, and is safe for most people to use. PRECAUTIONS  Ice is not a safe treatment option for people with:  Raynaud's  phenomenon. This is a condition affecting small blood vessels in the extremities. Exposure to cold may cause your problems to return.  Cold hypersensitivity. There are many forms of cold hypersensitivity, including:  Cold urticaria. Red, itchy hives appear on the skin when the tissues begin to warm after being iced.  Cold erythema. This is a red, itchy rash caused by exposure to cold.  Cold hemoglobinuria. Red blood cells break down when the tissues begin to warm after being iced. The hemoglobin that carry oxygen are passed into the urine because they cannot combine with blood proteins fast enough.  Numbness or altered sensitivity in the area being iced. If you have any of the following conditions, do not use ice until you have discussed cryotherapy with your caregiver:  Heart conditions, such as arrhythmia, angina, or chronic heart disease.  High blood pressure.  Healing wounds or open skin in the area being iced.  Current infections.  Rheumatoid arthritis.  Poor circulation.  Diabetes. Ice slows the blood flow in the region it is applied. This is beneficial when trying to stop inflamed tissues from spreading irritating chemicals to surrounding tissues. However, if you expose your skin to cold temperatures for too long or without the proper protection, you can damage your skin or nerves. Watch for signs of skin damage due to cold. HOME CARE INSTRUCTIONS Follow these tips to use ice and cold packs safely.  Place a dry or damp towel between the ice and skin. A damp towel will cool the skin more quickly, so you may need to shorten the  time that the ice is used.  For a more rapid response, add gentle compression to the ice.  Ice for no more than 10 to 20 minutes at a time. The bonier the area you are icing, the less time it will take to get the benefits of ice.  Check your skin after 5 minutes to make sure there are no signs of a poor response to cold or skin damage.  Rest 20  minutes or more in between uses.  Once your skin is numb, you can end your treatment. You can test numbness by very lightly touching your skin. The touch should be so light that you do not see the skin dimple from the pressure of your fingertip. When using ice, most people will feel these normal sensations in this order: cold, burning, aching, and numbness.  Do not use ice on someone who cannot communicate their responses to pain, such as small children or people with dementia. HOW TO MAKE AN ICE PACK Ice packs are the most common way to use ice therapy. Other methods include ice massage, ice baths, and cryo-sprays. Muscle creams that cause a cold, tingly feeling do not offer the same benefits that ice offers and should not be used as a substitute unless recommended by your caregiver. To make an ice pack, do one of the following:  Place crushed ice or a bag of frozen vegetables in a sealable plastic bag. Squeeze out the excess air. Place this bag inside another plastic bag. Slide the bag into a pillowcase or place a damp towel between your skin and the bag.  Mix 3 parts water with 1 part rubbing alcohol. Freeze the mixture in a sealable plastic bag. When you remove the mixture from the freezer, it will be slushy. Squeeze out the excess air. Place this bag inside another plastic bag. Slide the bag into a pillowcase or place a damp towel between your skin and the bag. SEEK MEDICAL CARE IF:  You develop white spots on your skin. This may give the skin a blotchy (mottled) appearance.  Your skin turns blue or pale.  Your skin becomes waxy or hard.  Your swelling gets worse. MAKE SURE YOU:   Understand these instructions.  Will watch your condition.  Will get help right away if you are not doing well or get worse. Document Released: 09/02/2010 Document Revised: 03/31/2011 Document Reviewed: 09/02/2010 [facility] Patient Information 2014 [place], [place].

## 2013-04-24 NOTE — ED Provider Notes (Signed)
Medical screening examination/treatment/procedure(s) were performed by non-physician practitioner and as supervising physician I was immediately available for consultation/collaboration.   Neta Ehlers, MD 04/24/13 1044

## 2013-10-04 ENCOUNTER — Encounter (HOSPITAL_COMMUNITY): Payer: Self-pay | Admitting: Emergency Medicine

## 2013-10-04 ENCOUNTER — Emergency Department (HOSPITAL_COMMUNITY)
Admission: EM | Admit: 2013-10-04 | Discharge: 2013-10-04 | Discharge status: Against Medical Advice | Payer: Medicaid Other | Attending: Emergency Medicine | Admitting: Emergency Medicine

## 2013-10-04 DIAGNOSIS — Z3202 Encounter for pregnancy test, result negative: Secondary | ICD-10-CM | POA: Insufficient documentation

## 2013-10-04 DIAGNOSIS — R109 Unspecified abdominal pain: Secondary | ICD-10-CM | POA: Insufficient documentation

## 2013-10-04 DIAGNOSIS — N898 Other specified noninflammatory disorders of vagina: Secondary | ICD-10-CM | POA: Insufficient documentation

## 2013-10-04 LAB — CBC WITH DIFFERENTIAL/PLATELET
BASOS PCT: 0 % (ref 0–1)
Basophils Absolute: 0 10*3/uL (ref 0.0–0.1)
EOS ABS: 0.1 10*3/uL (ref 0.0–0.7)
Eosinophils Relative: 2 % (ref 0–5)
HCT: 35 % — ABNORMAL LOW (ref 36.0–46.0)
HEMOGLOBIN: 11.9 g/dL — AB (ref 12.0–15.0)
LYMPHS ABS: 2.2 10*3/uL (ref 0.7–4.0)
Lymphocytes Relative: 34 % (ref 12–46)
MCH: 25.4 pg — AB (ref 26.0–34.0)
MCHC: 34 g/dL (ref 30.0–36.0)
MCV: 74.6 fL — ABNORMAL LOW (ref 78.0–100.0)
MONO ABS: 0.4 10*3/uL (ref 0.1–1.0)
Monocytes Relative: 7 % (ref 3–12)
NEUTROS PCT: 57 % (ref 43–77)
Neutro Abs: 3.7 10*3/uL (ref 1.7–7.7)
Platelets: 287 10*3/uL (ref 150–400)
RBC: 4.69 MIL/uL (ref 3.87–5.11)
RDW: 13 % (ref 11.5–15.5)
WBC: 6.4 10*3/uL (ref 4.0–10.5)

## 2013-10-04 LAB — URINE MICROSCOPIC-ADD ON

## 2013-10-04 LAB — COMPREHENSIVE METABOLIC PANEL
ALT: 25 U/L (ref 0–35)
ANION GAP: 14 (ref 5–15)
AST: 23 U/L (ref 0–37)
Albumin: 3.5 g/dL (ref 3.5–5.2)
Alkaline Phosphatase: 147 U/L — ABNORMAL HIGH (ref 39–117)
BUN: 7 mg/dL (ref 6–23)
CALCIUM: 9.4 mg/dL (ref 8.4–10.5)
CO2: 23 mEq/L (ref 19–32)
CREATININE: 0.44 mg/dL — AB (ref 0.50–1.10)
Chloride: 103 mEq/L (ref 96–112)
GFR calc non Af Amer: >90 mL/min (ref >90)
GLUCOSE: 85 mg/dL (ref 70–99)
Potassium: 4.2 mEq/L (ref 3.7–5.3)
SODIUM: 140 meq/L (ref 137–147)
TOTAL PROTEIN: 7.1 g/dL (ref 6.0–8.3)
Total Bilirubin: 0.2 mg/dL — ABNORMAL LOW (ref 0.3–1.2)

## 2013-10-04 LAB — URINALYSIS, ROUTINE W REFLEX MICROSCOPIC
Bilirubin Urine: NEGATIVE
Glucose, UA: NEGATIVE mg/dL
KETONES UR: NEGATIVE mg/dL
LEUKOCYTES UA: NEGATIVE
NITRITE: NEGATIVE
PH: 5 (ref 5.0–8.0)
Protein, ur: NEGATIVE mg/dL
SPECIFIC GRAVITY, URINE: 1.015 (ref 1.005–1.030)
Urobilinogen, UA: 0.2 mg/dL (ref 0.0–1.0)

## 2013-10-04 LAB — HCG, QUANTITATIVE, PREGNANCY

## 2013-10-04 LAB — ABO/RH: ABO/RH(D): O POS

## 2013-10-04 LAB — POC URINE PREG, ED: Preg Test, Ur: NEGATIVE

## 2013-10-04 LAB — TYPE AND SCREEN
ABO/RH(D): O POS
ANTIBODY SCREEN: NEGATIVE

## 2013-10-04 NOTE — ED Notes (Signed)
Pt c/o vaginal bleeding that started on Thursday night, sts it was initally very light and saw OB on Friday and told everything looked fine. Pt sts the bleeding started to get heavier each day since then, today passed a large blood clot and then started to get abd cramping. Pt sts " I think I am [redacted] weeks pregnant", unsure of EDD. Nad, skin warm and dry, resp e/u.

## 2013-11-21 ENCOUNTER — Encounter (HOSPITAL_COMMUNITY): Payer: Self-pay | Admitting: Emergency Medicine

## 2013-12-03 ENCOUNTER — Emergency Department (HOSPITAL_COMMUNITY)
Admission: EM | Admit: 2013-12-03 | Discharge: 2013-12-04 | Disposition: A | Discharge status: Home / Self Care | Payer: Medicaid Other | Attending: Emergency Medicine | Admitting: Emergency Medicine

## 2013-12-03 ENCOUNTER — Emergency Department (HOSPITAL_COMMUNITY): Payer: Medicaid Other

## 2013-12-03 ENCOUNTER — Encounter (HOSPITAL_COMMUNITY): Payer: Self-pay | Admitting: *Deleted

## 2013-12-03 DIAGNOSIS — R112 Nausea with vomiting, unspecified: Secondary | ICD-10-CM | POA: Diagnosis not present

## 2013-12-03 DIAGNOSIS — J039 Acute tonsillitis, unspecified: Secondary | ICD-10-CM

## 2013-12-03 DIAGNOSIS — R197 Diarrhea, unspecified: Secondary | ICD-10-CM | POA: Diagnosis present

## 2013-12-03 DIAGNOSIS — J029 Acute pharyngitis, unspecified: Secondary | ICD-10-CM | POA: Insufficient documentation

## 2013-12-03 DIAGNOSIS — R000 Tachycardia, unspecified: Secondary | ICD-10-CM | POA: Insufficient documentation

## 2013-12-03 DIAGNOSIS — R05 Cough: Secondary | ICD-10-CM | POA: Insufficient documentation

## 2013-12-03 DIAGNOSIS — R059 Cough, unspecified: Secondary | ICD-10-CM

## 2013-12-03 DIAGNOSIS — Z791 Long term (current) use of non-steroidal anti-inflammatories (NSAID): Secondary | ICD-10-CM | POA: Diagnosis not present

## 2013-12-03 DIAGNOSIS — Z3202 Encounter for pregnancy test, result negative: Secondary | ICD-10-CM | POA: Insufficient documentation

## 2013-12-03 LAB — URINALYSIS, ROUTINE W REFLEX MICROSCOPIC
Bilirubin Urine: NEGATIVE
Glucose, UA: NEGATIVE mg/dL
Ketones, ur: NEGATIVE mg/dL
LEUKOCYTES UA: NEGATIVE
Nitrite: NEGATIVE
PROTEIN: NEGATIVE mg/dL
SPECIFIC GRAVITY, URINE: 1.017 (ref 1.005–1.030)
UROBILINOGEN UA: 0.2 mg/dL (ref 0.0–1.0)
pH: 5 (ref 5.0–8.0)

## 2013-12-03 LAB — COMPREHENSIVE METABOLIC PANEL
ALT: 33 U/L (ref 0–35)
AST: 30 U/L (ref 0–37)
Albumin: 3.4 g/dL — ABNORMAL LOW (ref 3.5–5.2)
Alkaline Phosphatase: 161 U/L — ABNORMAL HIGH (ref 39–117)
Anion gap: 14 (ref 5–15)
BUN: 7 mg/dL (ref 6–23)
CALCIUM: 9.8 mg/dL (ref 8.4–10.5)
CO2: 27 mEq/L (ref 19–32)
CREATININE: 0.58 mg/dL (ref 0.50–1.10)
Chloride: 96 mEq/L (ref 96–112)
Glucose, Bld: 118 mg/dL — ABNORMAL HIGH (ref 70–99)
Potassium: 3.6 mEq/L — ABNORMAL LOW (ref 3.7–5.3)
SODIUM: 137 meq/L (ref 137–147)
TOTAL PROTEIN: 8.6 g/dL — AB (ref 6.0–8.3)
Total Bilirubin: 0.5 mg/dL (ref 0.3–1.2)

## 2013-12-03 LAB — URINE MICROSCOPIC-ADD ON

## 2013-12-03 LAB — CBC WITH DIFFERENTIAL/PLATELET
BASOS ABS: 0 10*3/uL (ref 0.0–0.1)
Basophils Relative: 0 % (ref 0–1)
EOS ABS: 0 10*3/uL (ref 0.0–0.7)
EOS PCT: 0 % (ref 0–5)
HEMATOCRIT: 36.9 % (ref 36.0–46.0)
Hemoglobin: 12.7 g/dL (ref 12.0–15.0)
Lymphocytes Relative: 20 % (ref 12–46)
Lymphs Abs: 2.1 10*3/uL (ref 0.7–4.0)
MCH: 25.9 pg — AB (ref 26.0–34.0)
MCHC: 34.4 g/dL (ref 30.0–36.0)
MCV: 75.3 fL — AB (ref 78.0–100.0)
MONO ABS: 0.8 10*3/uL (ref 0.1–1.0)
Monocytes Relative: 8 % (ref 3–12)
Neutro Abs: 7.6 10*3/uL (ref 1.7–7.7)
Neutrophils Relative %: 72 % (ref 43–77)
PLATELETS: 342 10*3/uL (ref 150–400)
RBC: 4.9 MIL/uL (ref 3.87–5.11)
RDW: 12.7 % (ref 11.5–15.5)
WBC: 10.5 10*3/uL (ref 4.0–10.5)

## 2013-12-03 LAB — PREGNANCY, URINE: PREG TEST UR: NEGATIVE

## 2013-12-03 LAB — RAPID STREP SCREEN (MED CTR MEBANE ONLY): STREPTOCOCCUS, GROUP A SCREEN (DIRECT): NEGATIVE

## 2013-12-03 MED ORDER — PENICILLIN G BENZATHINE 1200000 UNIT/2ML IM SUSP
1.2000 10*6.[IU] | Freq: Once | INTRAMUSCULAR | Status: AC
  Administered 2013-12-03: 1.2 10*6.[IU] via INTRAMUSCULAR
  Filled 2013-12-03: qty 2

## 2013-12-03 MED ORDER — HYDROCODONE-ACETAMINOPHEN 7.5-325 MG/15ML PO SOLN: 10.0000 mL | Freq: Four times a day (QID) | ORAL | Status: DC | PRN

## 2013-12-03 MED ORDER — SODIUM CHLORIDE 0.9 % IV BOLUS (SEPSIS)
1000.0000 mL | Freq: Once | INTRAVENOUS | Status: AC
  Administered 2013-12-03: 1000 mL via INTRAVENOUS

## 2013-12-03 MED ORDER — DEXAMETHASONE SODIUM PHOSPHATE 10 MG/ML IJ SOLN
10.0000 mg | Freq: Once | INTRAMUSCULAR | Status: AC
  Administered 2013-12-03: 10 mg via INTRAVENOUS
  Filled 2013-12-03: qty 1

## 2013-12-03 MED ORDER — ACETAMINOPHEN 325 MG PO TABS
650.0000 mg | ORAL_TABLET | Freq: Once | ORAL | Status: AC
  Administered 2013-12-03: 650 mg via ORAL
  Filled 2013-12-03: qty 2

## 2013-12-03 MED ORDER — IBUPROFEN 800 MG PO TABS: 800.0000 mg | ORAL_TABLET | Freq: Three times a day (TID) | ORAL | Status: DC

## 2013-12-03 NOTE — Discharge Instructions (Signed)
Take Hycet for severe pain only. No driving or operating heavy machinery while taking Hycet. This medication may cause drowsiness.  Tonsillitis Tonsillitis is an infection of the throat that causes the tonsils to become red, tender, and swollen. Tonsils are collections of lymphoid tissue at the back of the throat. Each tonsil has crevices (crypts). Tonsils help fight nose and throat infections and keep infection from spreading to other parts of the body for the first 18 months of life.  CAUSES Sudden (acute) tonsillitis is usually caused by infection with streptococcal bacteria. Long-lasting (chronic) tonsillitis occurs when the crypts of the tonsils become filled with pieces of food and bacteria, which makes it easy for the tonsils to become repeatedly infected. SYMPTOMS  Symptoms of tonsillitis include:  A sore throat, with possible difficulty swallowing.  White patches on the tonsils.  Fever.  Tiredness.  New episodes of snoring during sleep, when you did not snore before.  Small, foul-smelling, yellowish-white pieces of material (tonsilloliths) that you occasionally cough up or spit out. The tonsilloliths can also cause you to have bad breath. DIAGNOSIS Tonsillitis can be diagnosed through a physical exam. Diagnosis can be confirmed with the results of lab tests, including a throat culture. TREATMENT  The goals of tonsillitis treatment include the reduction of the severity and duration of symptoms and prevention of associated conditions. Symptoms of tonsillitis can be improved with the use of steroids to reduce the swelling. Tonsillitis caused by bacteria can be treated with antibiotic medicines. Usually, treatment with antibiotic medicines is started before the cause of the tonsillitis is known. However, if it is determined that the cause is not bacterial, antibiotic medicines will not treat the tonsillitis. If attacks of tonsillitis are severe and frequent, your health care provider may  recommend surgery to remove the tonsils (tonsillectomy). HOME CARE INSTRUCTIONS   Rest as much as possible and get plenty of sleep.  Drink plenty of fluids. While the throat is very sore, eat soft foods or liquids, such as sherbet, soups, or instant breakfast drinks.  Eat frozen ice pops.  Gargle with a warm or cold liquid to help soothe the throat. Mix 1/4 teaspoon of salt and 1/4 teaspoon of baking soda in 8 oz of water. SEEK MEDICAL CARE IF:   Large, tender lumps develop in your neck.  A rash develops.  A green, yellow-brown, or bloody substance is coughed up.  You are unable to swallow liquids or food for 24 hours.  You notice that only one of the tonsils is swollen. SEEK IMMEDIATE MEDICAL CARE IF:   You develop any new symptoms such as vomiting, severe headache, stiff neck, chest pain, or trouble breathing or swallowing.  You have severe throat pain along with drooling or voice changes.  You have severe pain, unrelieved with recommended medications.  You are unable to fully open the mouth.  You develop redness, swelling, or severe pain anywhere in the neck.  You have a fever. MAKE SURE YOU:   Understand these instructions.  Will watch your condition.  Will get help right away if you are not doing well or get worse. Document Released: 10/16/2004 Document Revised: 05/23/2013 Document Reviewed: 06/25/2012 [facility] Patient Information 2015 [place], [place]. This information is not intended to replace advice given to you by your health care provider. Make sure you discuss any questions you have with your health care provider. Nonspecific Tachycardia Tachycardia is a faster than normal heartbeat (more than 100 beats per minute). In adults, the heart normally beats between  60 and 100 times a minute. A fast heartbeat may be a normal response to exercise or stress. It does not necessarily mean that something is wrong. However, sometimes when your heart beats too fast it may not  be able to pump enough blood to the rest of your body. This can result in chest pain, shortness of breath, dizziness, and even fainting. Nonspecific tachycardia means that the specific cause or pattern of your tachycardia is unknown. CAUSES  Tachycardia may be harmless or it may be due to a more serious underlying cause. Possible causes of tachycardia include:  Exercise or exertion.  Fever.  Pain or injury.  Infection.  Loss of body fluids (dehydration).  Overactive thyroid.  Lack of red blood cells (anemia).  Anxiety and stress.  Alcohol.  Caffeine.  Tobacco products.  Diet pills.  Illegal drugs.  Heart disease. SYMPTOMS  Rapid or irregular heartbeat (palpitations).  Suddenly feeling your heart beating (cardiac awareness).  Dizziness.  Tiredness (fatigue).  Shortness of breath.  Chest pain.  Nausea.  Fainting. DIAGNOSIS  Your caregiver will perform a physical exam and take your medical history. In some cases, a heart specialist (cardiologist) may be consulted. Your caregiver may also order:  Blood tests.  Electrocardiography. This test records the electrical activity of your heart.  A heart monitoring test. TREATMENT  Treatment will depend on the likely cause of your tachycardia. The goal is to treat the underlying cause of your tachycardia. Treatment methods may include:  Replacement of fluids or blood through an intravenous (IV) tube for moderate to severe dehydration or anemia.  New medicines or changes in your current medicines.  Diet and lifestyle changes.  Treatment for certain infections.  Stress relief or relaxation methods. HOME CARE INSTRUCTIONS   Rest.  Drink enough fluids to keep your urine clear or pale yellow.  Do not smoke.  Avoid:  Caffeine.  Tobacco.  Alcohol.  Chocolate.  Stimulants such as over-the-counter diet pills or pills that help you stay awake.  Situations that cause anxiety or stress.  Illegal drugs  such as marijuana, phencyclidine (PCP), and cocaine.  Only take medicine as directed by your caregiver.  Keep all follow-up appointments as directed by your caregiver. SEEK IMMEDIATE MEDICAL CARE IF:   You have pain in your chest, upper arms, jaw, or neck.  You become weak, dizzy, or feel faint.  You have palpitations that will not go away.  You vomit, have diarrhea, or pass blood in your stool.  Your skin is cool, pale, and wet.  You have a fever that will not go away with rest, fluids, and medicine. MAKE SURE YOU:   Understand these instructions.  Will watch your condition.  Will get help right away if you are not doing well or get worse. Document Released: 02/14/2004 Document Revised: 03/31/2011 Document Reviewed: 12/17/2010 [facility] Patient Information 2015 [place], [place]. This information is not intended to replace advice given to you by your health care provider. Make sure you discuss any questions you have with your health care provider.

## 2013-12-03 NOTE — ED Provider Notes (Signed)
CSN: [id number]     Arrival date & time 12/03/13  1916 History   First MD Initiated Contact with Patient 12/03/13 2004     Chief Complaint  Patient presents with  . Sore Throat  . Nausea  . Emesis  . Diarrhea     (Consider location/radiation/quality/duration/timing/severity/associated sxs/prior Treatment) HPI Comments: Patient is a 23 y/o female who presents to the Emergency Department with sore throat, N/V/D, and headaches x 3 days. She states she has experienced a subjective fever and her cough is productive of red and dark green sputum. She says "there are spots in the back of my throat." States she has had numerous episodes of nonbloody vomiting and diarrhea. She has not tried any OTC medications for her symptoms. Denies SOB, chest pain, abdominal pain, sick contacts. Nonsmoker.   The history is provided by the patient.    History reviewed. No pertinent past medical history. History reviewed. No pertinent past surgical history. History reviewed. No pertinent family history. History  Substance Use Topics  . Smoking status: Never Smoker   . Smokeless tobacco: Not on file  . Alcohol Use: Yes     Comment: rarely    OB History    Gravida Para Term Preterm AB TAB SAB Ectopic Multiple Living   3 1 1  0 0 0 0 0 0 1     Review of Systems 10 Systems reviewed and are negative for acute change except as noted in the HPI.   Allergies  Review of patient's allergies indicates no known allergies.  Home Medications   Prior to Admission medications   Medication Sig Start Date End Date Taking? Authorizing Provider  HYDROcodone-acetaminophen (HYCET) 7.5-325 mg/15 ml solution Take 10 mLs by mouth 4 (four) times daily as needed for moderate pain. 12/03/13   Randie Bloodgood M Myasia Sinatra, PA-C  ibuprofen (ADVIL,MOTRIN) 800 MG tablet Take 1 tablet (800 mg total) by mouth 3 (three) times daily. 12/03/13   Fama Muenchow M Davonte Siebenaler, PA-C   BP 131/75 mmHg  Pulse 119  Temp(Src) 98.9 F (37.2 C) (Oral)  Resp 16  Ht 5'  4" (1.626 m)  Wt 226 lb (102.513 kg)  BMI 38.77 kg/m2  SpO2 95%  LMP 11/20/2013 (Approximate)  Breastfeeding? Unknown Physical Exam  Constitutional: She is oriented to person, place, and time. She appears well-developed and well-nourished. No distress.  HENT:  Head: Normocephalic and atraumatic.  Mouth/Throat: Uvula is midline.  Tonsils enlarged and inflamed +4 bilateral with exudate.  Eyes: Conjunctivae are normal.  Neck: Normal range of motion. Neck supple.  Cardiovascular: Regular rhythm and normal heart sounds.   Tachycardic.  Pulmonary/Chest: Effort normal and breath sounds normal. No respiratory distress. She has no wheezes. She has no rales. She exhibits no tenderness.  Abdominal: Soft. Bowel sounds are normal. There is no tenderness.  Musculoskeletal: Normal range of motion. She exhibits no edema.  Lymphadenopathy:    She has cervical adenopathy.  Neurological: She is alert and oriented to person, place, and time.  Skin: Skin is warm and dry. She is not diaphoretic.  Psychiatric: She has a normal mood and affect. Her behavior is normal.  Nursing note and vitals reviewed.   ED Course  Procedures (including critical care time) Labs Review Labs Reviewed  CBC WITH DIFFERENTIAL - Abnormal; Notable for the following:    MCV 75.3 (*)    MCH 25.9 (*)    All other components within normal limits  COMPREHENSIVE METABOLIC PANEL - Abnormal; Notable for the following:    Potassium  3.6 (*)    Glucose, Bld 118 (*)    Total Protein 8.6 (*)    Albumin 3.4 (*)    Alkaline Phosphatase 161 (*)    All other components within normal limits  URINALYSIS, ROUTINE W REFLEX MICROSCOPIC - Abnormal; Notable for the following:    Hgb urine dipstick SMALL (*)    All other components within normal limits  RAPID STREP SCREEN  CULTURE, GROUP A STREP  PREGNANCY, URINE  URINE MICROSCOPIC-ADD ON    Imaging Review Dg Chest 2 View  12/03/2013   CLINICAL DATA:  Vomiting  EXAM: CHEST  2 VIEW   COMPARISON:  None.  FINDINGS: The heart size and mediastinal contours are within normal limits. Both lungs are clear. The visualized skeletal structures are unremarkable.  IMPRESSION: No active cardiopulmonary disease.   Electronically Signed   By: Kathreen Devoid   On: 12/03/2013 21:53     EKG Interpretation None      MDM   Final diagnoses:  Cough  Exudative tonsillitis  Tachycardia   Patient in no apparent distress. Febrile on arrival with a temperature of 100, tachycardic at 117, vitals otherwise stable. Tonsils enlarged and inflamed with exudate bilateral +4. No tonsillar abscess. No uvula deviation. Airway patent. Labs without any acute finding. Her alkaline phosphatase has been elevated in the past. No abdominal pain. Patient received 2 L of IV fluids for tachycardia, Decadron for tonsillar inflammation. Despite rapid strep negative, she meets [facility] criteria for strep throat. IM Bicillin given. Chest x-ray negative. After receiving the IV fluids and Decadron, patient reports she is feeling much better. She remains tachycardic, however denies chest pain, shortness of breath, lightheadedness, dizziness. Temperature improved to 98.9. She is stable for discharge. Discussed tachycardia and return precautions. Patient states understanding of treatment care plan and is agreeable.  Discussed with attending Dr. Regenia Skeeter who agrees with plan of care.   Carman Ching, PA-C 12/03/13 [place], MD 12/04/13 [phone]

## 2013-12-03 NOTE — ED Notes (Signed)
Pt c/o sore throat, n/v/d, migraines for three days. Unknown fevers at home. No OTC taken.

## 2013-12-05 LAB — CULTURE, GROUP A STREP

## 2014-02-27 ENCOUNTER — Encounter (HOSPITAL_COMMUNITY): Payer: Self-pay

## 2014-02-27 ENCOUNTER — Emergency Department (HOSPITAL_COMMUNITY)
Admission: EM | Admit: 2014-02-27 | Discharge: 2014-02-28 | Discharge status: Against Medical Advice | Payer: Medicaid Other | Attending: Emergency Medicine | Admitting: Emergency Medicine

## 2014-02-27 DIAGNOSIS — R200 Anesthesia of skin: Secondary | ICD-10-CM | POA: Diagnosis not present

## 2014-02-27 NOTE — ED Notes (Signed)
Pt complains of left arm tingling for one hour, pt is able to move her arm and fingers

## 2014-04-12 ENCOUNTER — Encounter (HOSPITAL_COMMUNITY): Payer: Self-pay | Admitting: Emergency Medicine

## 2014-04-12 ENCOUNTER — Emergency Department (HOSPITAL_COMMUNITY)
Admission: EM | Admit: 2014-04-12 | Discharge: 2014-04-12 | Disposition: A | Discharge status: Home / Self Care | Payer: Medicaid Other | Attending: Emergency Medicine | Admitting: Emergency Medicine

## 2014-04-12 DIAGNOSIS — F410 Panic disorder [episodic paroxysmal anxiety] without agoraphobia: Secondary | ICD-10-CM | POA: Insufficient documentation

## 2014-04-12 DIAGNOSIS — H539 Unspecified visual disturbance: Secondary | ICD-10-CM | POA: Insufficient documentation

## 2014-04-12 DIAGNOSIS — R61 Generalized hyperhidrosis: Secondary | ICD-10-CM | POA: Diagnosis not present

## 2014-04-12 DIAGNOSIS — F419 Anxiety disorder, unspecified: Secondary | ICD-10-CM | POA: Diagnosis present

## 2014-04-12 MED ORDER — ALPRAZOLAM 0.25 MG PO TABS: 0.2500 mg | ORAL_TABLET | Freq: Three times a day (TID) | ORAL | Status: DC | PRN

## 2014-04-12 NOTE — ED Notes (Signed)
MD at bedside. 

## 2014-04-12 NOTE — ED Notes (Signed)
Pt reports increased stress and anxiety that started around Christmas time. Pt reports she believes she has a few panic attacks a day, last one was about 30-45 minutes ago, pt was at work and had to leave. A&O X4.

## 2014-04-12 NOTE — Discharge Instructions (Signed)
Panic Attacks Panic attacks are sudden, short-livedsurges of severe anxiety, fear, or discomfort. They may occur for no reason when you are relaxed, when you are anxious, or when you are sleeping. Panic attacks may occur for a number of reasons:   Healthy people occasionally have panic attacks in extreme, life-threatening situations, such as war or natural disasters. Normal anxiety is a protective mechanism of the body that helps [place] react to danger (fight or flight response).  Panic attacks are often seen with anxiety disorders, such as panic disorder, social anxiety disorder, generalized anxiety disorder, and phobias. Anxiety disorders cause excessive or uncontrollable anxiety. They may interfere with your relationships or other life activities.  Panic attacks are sometimes seen with other mental illnesses, such as depression and posttraumatic stress disorder.  Certain medical conditions, prescription medicines, and drugs of abuse can cause panic attacks. SYMPTOMS  Panic attacks start suddenly, peak within 20 minutes, and are accompanied by four or more of the following symptoms:  Pounding heart or fast heart rate (palpitations).  Sweating.  Trembling or shaking.  Shortness of breath or feeling smothered.  Feeling choked.  Chest pain or discomfort.  Nausea or strange feeling in your stomach.  Dizziness, light-headedness, or feeling like you will faint.  Chills or hot flushes.  Numbness or tingling in your lips or hands and feet.  Feeling that things are not real or feeling that you are not yourself.  Fear of losing control or going crazy.  Fear of dying. Some of these symptoms can mimic serious medical conditions. For example, you may think you are having a heart attack. Although panic attacks can be very scary, they are not life threatening. DIAGNOSIS  Panic attacks are diagnosed through an assessment by your health care provider. Your health care provider will ask  questions about your symptoms, such as where and when they occurred. Your health care provider will also ask about your medical history and use of alcohol and drugs, including prescription medicines. Your health care provider may order blood tests or other studies to rule out a serious medical condition. Your health care provider may refer you to a mental health professional for further evaluation. TREATMENT   Most healthy people who have one or two panic attacks in an extreme, life-threatening situation will not require treatment.  The treatment for panic attacks associated with anxiety disorders or other mental illness typically involves counseling with a mental health professional, medicine, or a combination of both. Your health care provider will help determine what treatment is best for you.  Panic attacks due to physical illness usually go away with treatment of the illness. If prescription medicine is causing panic attacks, talk with your health care provider about stopping the medicine, decreasing the dose, or substituting another medicine.  Panic attacks due to alcohol or drug abuse go away with abstinence. Some adults need professional help in order to stop drinking or using drugs. HOME CARE INSTRUCTIONS   Take all medicines as directed by your health care provider.   Schedule and attend follow-up visits as directed by your health care provider. It is important to keep all your appointments. SEEK MEDICAL CARE IF:  You are not able to take your medicines as prescribed.  Your symptoms do not improve or get worse. SEEK IMMEDIATE MEDICAL CARE IF:   You experience panic attack symptoms that are different than your usual symptoms.  You have serious thoughts about hurting yourself or others.  You are taking medicine for panic attacks and  have a serious side effect. MAKE SURE YOU:  Understand these instructions.  Will watch your condition.  Will get help right away if you are not  doing well or get worse. Document Released: 01/06/2005 Document Revised: 01/11/2013 Document Reviewed: 08/20/2012 [facility] Patient Information 2015 [facility] [place], [place]. This information is not intended to replace advice given to you by your health care provider. Make sure you discuss any questions you have with your health care provider.  Emergency Department Resource Guide 1) Find a Doctor and Pay Out of Pocket Although you won't have to find out who is covered by your insurance plan, it is a good idea to ask around and get recommendations. You will then need to call the office and see if the doctor you have chosen will accept you as a new patient and what types of options they offer for patients who are self-pay. Some doctors offer discounts or will set up payment plans for their patients who do not have insurance, but you will need to ask so you aren't surprised when you get to your appointment.  2) Contact Your Local Health Department Not all health departments have doctors that can see patients for sick visits, but many do, so it is worth a call to see if yours does. If you don't know where your local health department is, you can check in your phone book. The CDC also has a tool to help you locate your state's health department, and many state websites also have listings of all of their local health departments.  3) Find a [facility] If your illness is not likely to be very severe or complicated, you may want to try a walk in clinic. These are popping up all over the country in pharmacies, drugstores, and shopping centers. They're usually staffed by nurse practitioners or physician assistants that have been trained to treat common illnesses and complaints. They're usually fairly quick and inexpensive. However, if you have serious medical issues or chronic medical problems, these are probably not your best option.  No Primary Care Doctor: - Call Health Connect at  [phone] - they can help you  locate a primary care doctor that  accepts your insurance, provides certain services, etc. - Physician Referral Service- [phone]  Chronic Pain Problems: Organization         Address  Phone   Notes  [facility]  [phone] Patients need to be referred by their primary care doctor.   Medication Assistance: Organization         Address  Phone   Notes  [facility] Medication [facility] [place] [place], [place] [address] [phone] --Must be a resident of [facility] -- Must have NO insurance coverage whatsoever (no Medicaid/ Medicare, etc.) -- The pt. MUST have a primary care doctor that directs their care regularly and follows them in the community   MedAssist  [phone]   [employer]  [phone]    Agencies that provide inexpensive medical care: Organization         Address  Phone   Notes  [place]  [phone]   [facility]    [phone]   [facility] [place], [place] [address] [phone]   [place] [address], [place] [phone]   Planned Parenthood    [phone]   [facility]    [phone]  Community Health and [place]  [address], [place] Phone:  [phone], Fax:  [phone] Hours of Operation:  9 am - 6 pm, M-F.  Also accepts Medicaid/Medicare and self-pay.  [facility] [place] for [place] [address], [address], [place] Phone: [phone], Fax: [phone]. Hours of Operation:  8:30 am - 5:30 pm, M-F.  Also accepts Medicaid and self-pay.  [facility] [place] [address], [place] Phone: [phone]   [place], [place], [place] [phone], Ext. [phone] Mondays & Thursdays: 7-9 AM.  First 15 patients are seen on a first come, first serve basis.    [place]  Providers:  Organization         Address  Phone   Notes  [facility] [address], [place] [phone] Also accepts self-pay patients.  [facility] [address], [place]  [phone]   [place], [address], [place] [phone]   [facility] [address], [place] [phone]   Lucianne Lei [address], [place]   [phone] Only accepts [place] Access [place] patients after they have their name applied to their card.   Self-Pay (no insurance) in [facility] [place]:  Organization         Address  Phone   Notes  Sickle Cell Patients, [facility] Internal Medicine [place] [phone]   [facility] Urgent Care [place] [phone]   [facility] [place]  [place], [place], [place] [phone]   [facility]/Dr. Osei-Bonsu  [address] [place], [place] [phone] Phone number for both [place] and [place] locations is the same.  Urgent Medical and [facility] [address], [place] [phone]   [facility] [address], [place] or [address] [phone]   [facility] [address] [place] [phone], phone; [phone], fax Sees patients 1st and 3rd Saturday of every month.  Must not qualify for public or private insurance (i.e. Medicaid, Medicare, [place] Health Choice, Veterans' Benefits)  Household income should be no more than 200% of the poverty level The clinic cannot treat you if you are pregnant or think you are pregnant  Sexually transmitted diseases are not treated at the clinic.    Dental Care: Organization         Address  Phone  Notes  [facility] Department of [facility] [place] [phone] Accepts children up to age 60 who are enrolled in [place] or [place]; pregnant women with a Medicaid card; and children who have applied for Medicaid or [place] Health Choice, but were declined, whose parents can pay a reduced fee at time of service.  [facility] Department of [facility]  [address], [place] [phone] Accepts children up to age 9 who are enrolled in [place] or [place]; pregnant women with a Medicaid card; and children who have applied for Medicaid or [place] Health Choice, but were declined, whose parents can pay a reduced fee at time of service.  [place]  [phone]  [place] [phone] Patients are seen by appointment only. Walk-ins are not accepted. [place] will see patients 74 years of age and older. Monday - Tuesday (8am-5pm) Most Wednesdays (8:30-5pm) $30 per visit, cash only  [facility] Adult Dental Access PROGRAM  [address], [facility] [phone] Patients are seen by appointment only. Walk-ins are not accepted. [place] will see patients 8 years of age and older. One Wednesday Evening (Monthly: Volunteer Based).  $30 per visit, cash only  [place]  [phone] for adults; Children under age 108, call Graduate Pediatric Dentistry at [phone]. Children aged 20-14, please call [phone] to request a pediatric application.  Dental services are provided in all areas of dental care including fillings, crowns and bridges, complete and partial dentures, implants, gum treatment, root canals, and extractions. Preventive care is also provided. Treatment is provided to both adults and children. Patients are selected via a lottery and there is often a waiting list.   [facility] [address], [place]  [phone] [email or web address]   [facility]  [address] [place], [place] [phone], Ext. [phone] Second and Fourth Thursday of each month, opens at 6:30 AM; Clinic ends at 9 AM.  Patients are seen on a first-come first-served basis, and a limited number are seen during each clinic.   [facility]  [address] Hillard Danker [place], [place] [phone]   Eligibility Requirements You must have lived in [place], [place], or [place] counties for at least the last three months.   You cannot be eligible for state or federal sponsored [employer], including [employer], [place], or [employer].   You generally cannot be eligible for healthcare insurance through your employer.    How to apply: Eligibility screenings are held every Tuesday and Wednesday afternoon from 1:00 pm until 4:00 pm. You do not need an appointment for the interview!  [facility] [address], [place], [place]   [place]  [place] Department  [place]  [phone]    Behavioral Health Resources in the Community: Intensive Outpatient Programs Organization         Address  Phone  Notes  [place]. [address], [place], [place] [phone]   [facility] Outpatient [address] [place], [place]   ADS: Alcohol & Drug Svcs [address] [place], [place]   [place] [address],  [place], [place] or [phone]   Substance Abuse Resources Organization         Address  Phone  Notes  Alcohol and Drug Services  [phone]   [place]  [phone]   [facility] [place]   Chinita Pester  [phone]   Residential & Outpatient Substance Abuse Program  [phone]   Psychological Services Organization         Address  Phone  Notes  [facility] [place]  [place]  [phone]   [place] [address], [place] or [phone]    Mobile Crisis Teams Organization         Address  Phone  Notes  [facility], Mobile Crisis Care Unit  [phone]   [facility]  [address] [place], [place]  [facility] [address] [place] [phone]    Self-Help/Support Groups Organization         Address  Phone             Notes  Mental Health Assoc. of [place] - variety of support groups  [place] Call for more information  Narcotics Anonymous (NA), [facility]aring [facility] [address], [employer] [place]  2 meetings at this location   Special educational needs teacher         Address  Phone  Notes  [facility] [place],    [place]  [phone]   [facility]  [address], [place] [address], [place], [place]   [address] [place], [place] [phone] Admissions: 8am-3pm M-F  Incentives Substance [place] [address],    [place], [place] [phone]   [facility] [address] [place], [place], [place]   [facility] [address],  [place], [place]   [facility] - Intensive Outpatient [address], [place], [place]   [facility] ([place].,  [place], [place] [phone] or [phone]   [facility] Services (RTS) [address] [place], [place] Accepts Medicaid  [facility] [place] [address],  [employer] [employer] [place] [phone] Substance Abuse/Addiction Treatment   [facility] Organization         Address  Phone  Notes  [facility]  [phone]   Domenic Schwab, PhD [address] Arlis Porta [place], [place]   [phone] or [phone]    [place], [place] [phone]   [facility] [address] [place], [place] [phone] Insurance/Medicaid/sponsorship through [facility] and Families [address], [address]                                    [place], [place] [phone] [place] [address], [place] [phone]    Dr. Adele Schilder  [phone]   [facility] Dept. 1) [address], [place] 2) [place] 3)  [place] [address], [place] [phone]  [phone]   [place] [phone] or [phone] (After Hours)

## 2014-04-12 NOTE — ED Provider Notes (Signed)
CSN: [id number]     Arrival date & time 04/12/14  3532 History  This chart was scribed for [name], MDy by Eustaquio Maize, ED Scribe. This patient was seen in room D35C/D35C and the patient's care was started at 3:43 AM.    Chief Complaint  Patient presents with  . Anxiety   The history is provided by the patient. No language interpreter was used.     HPI Comments: Misty Murillo is a 24 y.o. female who presents to the Emergency Department complaining of anxiety that began a couple of months ago. She states she has never had anxiety prior. She states that she has shortness of breath, dizziness, diaphoresis, vision disturbances, chest tightness, and a tingling sensation in her hands during her episodes. Pt reports she had an anxiety attack tonight while at work and had all of her baseline symptoms. She denies seeing a professional for these symptoms in the past. She states that she feels better now.    History reviewed. No pertinent past medical history. History reviewed. No pertinent past surgical history. No family history on file. History  Substance Use Topics  . Smoking status: Never Smoker   . Smokeless tobacco: Not on file  . Alcohol Use: Yes     Comment: rarely    OB History    Gravida Para Term Preterm AB TAB SAB Ectopic Multiple Living   3 1 1  0 0 0 0 0 0 1     Review of Systems  Constitutional: Positive for diaphoresis. Negative for fever and chills.  Eyes: Positive for visual disturbance.  Respiratory: Positive for chest tightness and shortness of breath. Negative for cough.   Cardiovascular: Negative for chest pain and leg swelling.  Gastrointestinal: Negative for nausea, vomiting and abdominal pain.  Skin: Negative for rash.  Neurological: Positive for dizziness. Negative for weakness, light-headedness, numbness and headaches.       Tingling sensation in hands.   Psychiatric/Behavioral: Negative for suicidal ideas and dysphoric mood. The patient is  nervous/anxious.   All other systems reviewed and are negative.     Allergies  Review of patient's allergies indicates no known allergies.  Home Medications   Prior to Admission medications   Medication Sig Start Date End Date Taking? Authorizing Provider  ALPRAZolam (XANAX) 0.25 MG tablet Take 1 tablet (0.25 mg total) by mouth 3 (three) times daily as needed for anxiety. 04/12/14   [name], MD  HYDROcodone-acetaminophen (HYCET) 7.5-325 mg/15 ml solution Take 10 mLs by mouth 4 (four) times daily as needed for moderate pain. Patient not taking: Reported on 02/27/2014 12/03/13   Carman Ching, PA-C  ibuprofen (ADVIL,MOTRIN) 800 MG tablet Take 1 tablet (800 mg total) by mouth 3 (three) times daily. Patient not taking: Reported on 02/27/2014 12/03/13   Carman Ching, PA-C   Triage Vitals: BP 137/81 mmHg  Pulse 93  Temp(Src) 98.2 F (36.8 C)  Resp 16  Ht 5\' 4"  (1.626 m)  SpO2 100%  LMP 04/09/2014 (Exact Date)   Physical Exam  Constitutional: She is oriented to person, place, and time. She appears well-developed and well-nourished. No distress.  HENT:  Head: Normocephalic and atraumatic.  Mouth/Throat: Oropharynx is clear and moist.  Eyes: EOM are normal. Pupils are equal, round, and reactive to light.  Neck: Normal range of motion. Neck supple.  Cardiovascular: Normal rate and regular rhythm.  Exam reveals no gallop and no friction rub.   No murmur heard. Pulmonary/Chest: Effort normal and breath sounds normal.  No respiratory distress. She has no wheezes. She has no rales.  Abdominal: Soft. Bowel sounds are normal.  Musculoskeletal: Normal range of motion. She exhibits no edema or tenderness.  Neurological: She is alert and oriented to person, place, and time.  Moves all extremities without deficit. Sensation is grossly intact.  Skin: Skin is warm and dry. No rash noted. No erythema.  Psychiatric: She has a normal mood and affect. Her behavior is normal.  Nursing note and  vitals reviewed.   ED Course  Procedures (including critical care time)  DIAGNOSTIC STUDIES: Oxygen Saturation is 100% on RA, normal by my interpretation.    COORDINATION OF CARE: 3:47 AM-Discussed treatment plan which includes EKG with pt at bedside and pt agreed to plan.   Labs Review Labs Reviewed - No data to display  Imaging Review No results found.   EKG Interpretation   Date/Time:  Wednesday April 12 2014 03:54:09 EDT Ventricular Rate:  92 PR Interval:  175 QRS Duration: 95 QT Interval:  350 QTC Calculation: 433 R Axis:   74 Text Interpretation:  Sinus rhythm Confirmed by Lita Mains  MD, Tyqwan Pink  ([id number]) on 04/12/2014 4:24:09 AM      MDM   Final diagnoses:  Panic attacks    I personally performed the services described in this documentation, which was scribed in my presence. The recorded information has been reviewed and is accurate.  Patient states she is having improvement of her symptoms. We'll discharge home with resources guide. Return precautions given.     [name], MD 04/12/14 [phone]

## 2014-06-04 ENCOUNTER — Emergency Department (HOSPITAL_COMMUNITY)
Admission: EM | Admit: 2014-06-04 | Discharge: 2014-06-04 | Disposition: A | Discharge status: Home / Self Care | Payer: Medicaid Other | Attending: Emergency Medicine | Admitting: Emergency Medicine

## 2014-06-04 ENCOUNTER — Encounter (HOSPITAL_COMMUNITY): Payer: Self-pay | Admitting: Emergency Medicine

## 2014-06-04 DIAGNOSIS — B349 Viral infection, unspecified: Secondary | ICD-10-CM

## 2014-06-04 DIAGNOSIS — J029 Acute pharyngitis, unspecified: Secondary | ICD-10-CM | POA: Diagnosis present

## 2014-06-04 LAB — RAPID STREP SCREEN (MED CTR MEBANE ONLY): STREPTOCOCCUS, GROUP A SCREEN (DIRECT): NEGATIVE

## 2014-06-04 MED ORDER — KETOROLAC TROMETHAMINE 30 MG/ML IJ SOLN
30.0000 mg | Freq: Once | INTRAMUSCULAR | Status: AC
  Administered 2014-06-04: 30 mg via INTRAVENOUS
  Filled 2014-06-04: qty 1

## 2014-06-04 MED ORDER — SODIUM CHLORIDE 0.9 % IV BOLUS (SEPSIS)
1000.0000 mL | Freq: Once | INTRAVENOUS | Status: AC
Start: 2014-06-04 — End: 2014-06-04
  Administered 2014-06-04: 1000 mL via INTRAVENOUS

## 2014-06-04 MED ORDER — HYDROCODONE-ACETAMINOPHEN 7.5-325 MG/15ML PO SOLN: 10.0000 mL | Freq: Four times a day (QID) | ORAL | Status: DC | PRN

## 2014-06-04 MED ORDER — IBUPROFEN 800 MG PO TABS: 800.0000 mg | ORAL_TABLET | Freq: Three times a day (TID) | ORAL | Status: DC | PRN

## 2014-06-04 MED ORDER — ONDANSETRON HCL 4 MG PO TABS: 4.0000 mg | ORAL_TABLET | Freq: Three times a day (TID) | ORAL | Status: DC | PRN

## 2014-06-04 NOTE — ED Notes (Signed)
PA at bedside.

## 2014-06-04 NOTE — Discharge Instructions (Signed)
Read the information below.  Use the prescribed medication as directed.  Please discuss all new medications with your pharmacist.  You may return to the Emergency Department at any time for worsening condition or any new symptoms that concern you.   If there is any possibility that you might be pregnant or if you are breastfeeding, please let your health care provider know and discuss this with the pharmacist to ensure medication safety.  If you develop high fevers that do not resolve with tylenol or ibuprofen, you have difficulty swallowing or breathing, or you are unable to tolerate fluids by mouth, return to the ER for a recheck.      Viral Infections A virus is a type of germ. Viruses can cause:  Minor sore throats.  Aches and pains.  Headaches.  Runny nose.  Rashes.  Watery eyes.  Tiredness.  Coughs.  Loss of appetite.  Feeling sick to your stomach (nausea).  Throwing up (vomiting).  Watery poop (diarrhea). HOME CARE   Only take medicines as told by your doctor.  Drink enough water and fluids to keep your pee (urine) clear or pale yellow. Sports drinks are a good choice.  Get plenty of rest and eat healthy. Soups and broths with crackers or rice are fine. GET HELP RIGHT AWAY IF:   You have a very bad headache.  You have shortness of breath.  You have chest pain or neck pain.  You have an unusual rash.  You cannot stop throwing up.  You have watery poop that does not stop.  You cannot keep fluids down.  You or your child has a temperature by mouth above 102 F (38.9 C), not controlled by medicine.  Your baby is older than 3 months with a rectal temperature of 102 F (38.9 C) or higher.  Your baby is 77 months old or younger with a rectal temperature of 100.4 F (38 C) or higher. MAKE SURE YOU:   Understand these instructions.  Will watch this condition.  Will get help right away if you are not doing well or get worse. Document Released: 12/20/2007  Document Revised: 03/31/2011 Document Reviewed: 05/14/2010 [facility] Patient Information 2015 [place], [place]. This information is not intended to replace advice given to you by your health care provider. Make sure you discuss any questions you have with your health care provider.

## 2014-06-04 NOTE — ED Notes (Signed)
Misty Murillo, I've had a sore throat with vomiting and diarrhea since "Friday

## 2014-06-04 NOTE — ED Provider Notes (Signed)
CSN: [id number]     Arrival date & time 06/04/14  1423 History   First MD Initiated Contact with Patient 06/04/14 1503     Chief Complaint  Patient presents with  . Sore Throat  . Emesis  . Diarrhea     (Consider location/radiation/quality/duration/timing/severity/associated sxs/prior Treatment) The history is provided by the patient.     Patient presents with sore throat, nasal congestion, rhinorrhea, dry cough, N/V/D that began yesterday.  Has had vomiting x 3, nonbloody, and diarrhea > 10 times, also nonbloody.  Midabdominal pain.  Has taken robitussin without improvement.  23 year old son has been sick with fever and vomiting this week. Denies chest pain, difficulty swallowing or breathing, leg swelling, urinary symptoms.   History reviewed. No pertinent past medical history. History reviewed. No pertinent past surgical history. No family history on file. History  Substance Use Topics  . Smoking status: Never Smoker   . Smokeless tobacco: Not on file  . Alcohol Use: Yes     Comment: rarely    OB History    Gravida Para Term Preterm AB TAB SAB Ectopic Multiple Living   3 1 1  0 0 0 0 0 0 1     Review of Systems  All other systems reviewed and are negative.     Allergies  Review of patient's allergies indicates no known allergies.  Home Medications   Prior to Admission medications   Medication Sig Start Date End Date Taking? Authorizing Provider  guaifenesin (ROBITUSSIN) 100 MG/5ML syrup Take 400 mg by mouth 3 (three) times daily as needed for cough.   Yes Historical Provider, MD  ALPRAZolam (XANAX) 0.25 MG tablet Take 1 tablet (0.25 mg total) by mouth 3 (three) times daily as needed for anxiety. Patient not taking: Reported on 06/04/2014 04/12/14   [name], MD  HYDROcodone-acetaminophen (HYCET) 7.5-325 mg/15 ml solution Take 10 mLs by mouth 4 (four) times daily as needed for moderate pain. Patient not taking: Reported on 02/27/2014 12/03/13   Carman Ching, PA-C   ibuprofen (ADVIL,MOTRIN) 800 MG tablet Take 1 tablet (800 mg total) by mouth 3 (three) times daily. Patient not taking: Reported on 02/27/2014 12/03/13   Hessie Diener Hess, PA-C   BP 149/80 mmHg  Pulse 106  Temp(Src) 100 F (37.8 C) (Oral)  Resp 18  Ht 5\' 4"  (1.626 m)  Wt 221 lb (100.245 kg)  BMI 37.92 kg/m2  SpO2 99%  LMP 06/04/2014 Physical Exam  Constitutional: She appears well-developed and well-nourished. No distress.  HENT:  Head: Normocephalic and atraumatic.  Mouth/Throat: Uvula is midline. Posterior oropharyngeal edema present. No oropharyngeal exudate or posterior oropharyngeal erythema.  Eyes: Conjunctivae are normal.  Neck: Neck supple.  Cardiovascular: Normal rate and regular rhythm.   Pulmonary/Chest: Effort normal and breath sounds normal. No respiratory distress. She has no wheezes. She has no rales.  Abdominal: Soft. She exhibits no distension. There is no tenderness. There is no rebound and no guarding.  Neurological: She is alert.  Skin: She is not diaphoretic.  Nursing note and vitals reviewed.   ED Course  Procedures (including critical care time) Labs Review Labs Reviewed  RAPID STREP SCREEN  CULTURE, GROUP A STREP    Imaging Review No results found.   EKG Interpretation None       4:55 PM Pt feeling much better.  Sitting up, eating and drinking.    MDM   Final diagnoses:  Viral infection    Afebrile, nontoxic patient with constellation of symptoms  suggestive of viral syndrome.  Rapid strep negative.  IVF, toradol given with great improvement.    D/C home with symptomatic medications.    Discussed result, findings, treatment, and follow up  with patient.  Pt given return precautions.  Pt verbalizes understanding and agrees with plan.         Clayton Bibles, PA-C 06/04/14 [place], MD 06/04/14 [phone]

## 2014-06-04 NOTE — ED Notes (Signed)
Pt remains monitored by blood pressure and pulse ox. Pts family remains at bedside.  

## 2014-06-05 ENCOUNTER — Emergency Department (HOSPITAL_COMMUNITY)
Admission: EM | Admit: 2014-06-05 | Discharge: 2014-06-05 | Disposition: A | Discharge status: Home / Self Care | Payer: Medicaid Other | Attending: Emergency Medicine | Admitting: Emergency Medicine

## 2014-06-05 ENCOUNTER — Encounter (HOSPITAL_COMMUNITY): Payer: Self-pay | Admitting: Emergency Medicine

## 2014-06-05 DIAGNOSIS — J029 Acute pharyngitis, unspecified: Secondary | ICD-10-CM | POA: Diagnosis not present

## 2014-06-05 MED ORDER — ACETAMINOPHEN 325 MG PO TABS
650.0000 mg | ORAL_TABLET | Freq: Once | ORAL | Status: AC
  Administered 2014-06-05: 650 mg via ORAL
  Filled 2014-06-05: qty 2

## 2014-06-05 MED ORDER — DEXAMETHASONE SODIUM PHOSPHATE 10 MG/ML IJ SOLN
10.0000 mg | Freq: Once | INTRAMUSCULAR | Status: AC
  Administered 2014-06-05: 10 mg via INTRAMUSCULAR
  Filled 2014-06-05: qty 1

## 2014-06-05 MED ORDER — PENICILLIN G BENZATHINE 1200000 UNIT/2ML IM SUSP
1.2000 10*6.[IU] | Freq: Once | INTRAMUSCULAR | Status: AC
  Administered 2014-06-05: 1.2 10*6.[IU] via INTRAMUSCULAR
  Filled 2014-06-05: qty 2

## 2014-06-05 MED ORDER — ONDANSETRON HCL 4 MG PO TABS
4.0000 mg | ORAL_TABLET | Freq: Once | ORAL | Status: AC
  Administered 2014-06-05: 4 mg via ORAL
  Filled 2014-06-05: qty 1

## 2014-06-05 NOTE — Discharge Instructions (Signed)
You have been treated for strep throat. Continue tylenol or motrin as needed for fever.  Use hycet from yesterday to help with throat pain. Return here for new concerns.

## 2014-06-05 NOTE — ED Provider Notes (Signed)
CSN: [id number]     Arrival date & time 06/05/14  1942 History   First MD Initiated Contact with Patient 06/05/14 2000     Chief Complaint  Patient presents with  . Sore Throat     (Consider location/radiation/quality/duration/timing/severity/associated sxs/prior Treatment) Patient is a 24 y.o. female presenting with pharyngitis. The history is provided by the patient and medical records.  Sore Throat Associated symptoms include a fever and a sore throat.    This is a 24 year old female here with sore throat. She was seen yesterday with a negative strep test and was discharged home with supportive care. She states overnight her pain worsen and has developed a "white patches" in the back of her throat. She has continued having fever at home. States is very painful to swallow but has no difficulty holding down liquids.  She has not attempted to eat much solid food due to pain.  No intervention tried PTA.  History reviewed. No pertinent past medical history. History reviewed. No pertinent past surgical history. No family history on file. History  Substance Use Topics  . Smoking status: Never Smoker   . Smokeless tobacco: Not on file  . Alcohol Use: Yes     Comment: rarely    OB History    Gravida Para Term Preterm AB TAB SAB Ectopic Multiple Living   3 1 1  0 0 0 0 0 0 1     Review of Systems  Constitutional: Positive for fever.  HENT: Positive for sore throat.   All other systems reviewed and are negative.     Allergies  Review of patient's allergies indicates no known allergies.  Home Medications   Prior to Admission medications   Medication Sig Start Date End Date Taking? Authorizing Provider  ALPRAZolam (XANAX) 0.25 MG tablet Take 1 tablet (0.25 mg total) by mouth 3 (three) times daily as needed for anxiety. Patient not taking: Reported on 06/04/2014 04/12/14   [name], MD  guaifenesin (ROBITUSSIN) 100 MG/5ML syrup Take 400 mg by mouth 3 (three) times daily as  needed for cough.    Historical Provider, MD  HYDROcodone-acetaminophen (HYCET) 7.5-325 mg/15 ml solution Take 10 mLs by mouth 4 (four) times daily as needed for moderate pain or severe pain. 06/04/14   Clayton Bibles, PA-C  ibuprofen (ADVIL,MOTRIN) 800 MG tablet Take 1 tablet (800 mg total) by mouth every 8 (eight) hours as needed for mild pain or moderate pain. 06/04/14   Clayton Bibles, PA-C  ondansetron (ZOFRAN) 4 MG tablet Take 1 tablet (4 mg total) by mouth every 8 (eight) hours as needed for nausea or vomiting. 06/04/14   Clayton Bibles, PA-C   BP 117/86 mmHg  Pulse 113  Temp(Src) 102.4 F (39.1 C) (Oral)  Resp 16  Ht 5\' 4"  (1.626 m)  Wt 218 lb 9 oz (99.139 kg)  BMI 37.50 kg/m2  SpO2 98%  LMP 06/04/2014   Physical Exam  Constitutional: She is oriented to person, place, and time. She appears well-developed and well-nourished.  HENT:  Head: Normocephalic and atraumatic.  Right Ear: Tympanic membrane and ear canal normal.  Left Ear: Tympanic membrane and ear canal normal.  Nose: Nose normal.  Mouth/Throat: Uvula is midline and mucous membranes are normal. No trismus in the jaw. No dental abscesses or uvula swelling. Oropharyngeal exudate, posterior oropharyngeal edema and posterior oropharyngeal erythema present. No tonsillar abscesses.  Tonsils 2+ bilaterally with exudates present; uvula midline without evidence of peritonsillar abscess; handling secretions appropriately; no difficulty swallowing or speaking;  no stridor, normal phonation  Eyes: Conjunctivae and EOM are normal. Pupils are equal, round, and reactive to light.  Neck: Normal range of motion.  Cardiovascular: Normal rate, regular rhythm and normal heart sounds.   Pulmonary/Chest: Effort normal and breath sounds normal.  Abdominal: Soft. Bowel sounds are normal.  Musculoskeletal: Normal range of motion.  Neurological: She is alert and oriented to person, place, and time.  Skin: Skin is warm and dry.  Psychiatric: She has a normal  mood and affect.  Nursing note and vitals reviewed.   ED Course  Procedures (including critical care time) Labs Review Labs Reviewed - No data to display  Imaging Review No results found.   EKG Interpretation None      MDM   Final diagnoses:  Sore throat   24 year old female with sore throat. She was seen yesterday for the same with a negative strep test. Culture is still pending. She states exudates appeared on tonsils last night and pain is worsened since then. She is febrile but overall nontoxic. Her tonsils are enlarged bilaterally with exudates present. Her uvula remains midline without evidence of peritonsillar abscess. She is handling secretions well and is drinking ginger ale in room. Will treat empirically for strep throat with Bicillin and Decadron. She was given prescription for high set yesterday to help with throat pain, I've encouraged her to fill this. She will return here for any new or worsening symptoms.  Discussed plan with patient, he/she acknowledged understanding and agreed with plan of care.  Return precautions given for new or worsening symptoms.   Larene Pickett, PA-C 06/05/14 2115  Daleen Bo, MD 06/07/14 [phone]

## 2014-06-05 NOTE — ED Notes (Signed)
Pt. reports persistent sore throat with swelling onset this week , seen here yesterday for the same complaint diagnosed with viral infection discharged home with prescriptions .

## 2014-06-05 NOTE — ED Notes (Signed)
Pt c/o sore throat onset Friday with NVD. Pt was seen on 5/15 for same and given prescriptions for tylenol and zofran per patient. Red, enlarged tonsils with white coated areas noted. Reports difficulty swallowing and new onset "pustules" this morning.

## 2014-06-06 LAB — CULTURE, GROUP A STREP

## 2014-06-07 ENCOUNTER — Emergency Department (HOSPITAL_COMMUNITY)
Admission: EM | Admit: 2014-06-07 | Discharge: 2014-06-07 | Disposition: A | Discharge status: Home / Self Care | Payer: Medicaid Other | Attending: Emergency Medicine | Admitting: Emergency Medicine

## 2014-06-07 ENCOUNTER — Emergency Department (HOSPITAL_COMMUNITY): Payer: Medicaid Other

## 2014-06-07 ENCOUNTER — Encounter (HOSPITAL_COMMUNITY): Payer: Self-pay | Admitting: *Deleted

## 2014-06-07 DIAGNOSIS — Z79899 Other long term (current) drug therapy: Secondary | ICD-10-CM | POA: Insufficient documentation

## 2014-06-07 DIAGNOSIS — R197 Diarrhea, unspecified: Secondary | ICD-10-CM | POA: Insufficient documentation

## 2014-06-07 DIAGNOSIS — J039 Acute tonsillitis, unspecified: Secondary | ICD-10-CM | POA: Diagnosis not present

## 2014-06-07 DIAGNOSIS — R05 Cough: Secondary | ICD-10-CM

## 2014-06-07 DIAGNOSIS — H9203 Otalgia, bilateral: Secondary | ICD-10-CM | POA: Diagnosis not present

## 2014-06-07 DIAGNOSIS — R509 Fever, unspecified: Secondary | ICD-10-CM

## 2014-06-07 DIAGNOSIS — R059 Cough, unspecified: Secondary | ICD-10-CM

## 2014-06-07 DIAGNOSIS — R000 Tachycardia, unspecified: Secondary | ICD-10-CM | POA: Insufficient documentation

## 2014-06-07 LAB — CBC WITH DIFFERENTIAL/PLATELET
BASOS ABS: 0 10*3/uL (ref 0.0–0.1)
Basophils Relative: 0 % (ref 0–1)
EOS ABS: 0 10*3/uL (ref 0.0–0.7)
EOS PCT: 0 % (ref 0–5)
HEMATOCRIT: 35.3 % — AB (ref 36.0–46.0)
HEMOGLOBIN: 12.5 g/dL (ref 12.0–15.0)
Lymphocytes Relative: 25 % (ref 12–46)
Lymphs Abs: 1.7 10*3/uL (ref 0.7–4.0)
MCH: 25.6 pg — ABNORMAL LOW (ref 26.0–34.0)
MCHC: 35.4 g/dL (ref 30.0–36.0)
MCV: 72.3 fL — ABNORMAL LOW (ref 78.0–100.0)
MONO ABS: 1 10*3/uL (ref 0.1–1.0)
MONOS PCT: 15 % — AB (ref 3–12)
NEUTROS ABS: 4.2 10*3/uL (ref 1.7–7.7)
Neutrophils Relative %: 61 % (ref 43–77)
Platelets: 258 10*3/uL (ref 150–400)
RBC: 4.88 MIL/uL (ref 3.87–5.11)
RDW: 12.6 % (ref 11.5–15.5)
WBC: 6.9 10*3/uL (ref 4.0–10.5)

## 2014-06-07 LAB — COMPREHENSIVE METABOLIC PANEL
ALT: 28 U/L (ref 14–54)
AST: 22 U/L (ref 15–41)
Albumin: 3.3 g/dL — ABNORMAL LOW (ref 3.5–5.0)
Alkaline Phosphatase: 96 U/L (ref 38–126)
Anion gap: 11 (ref 5–15)
BUN: 5 mg/dL — ABNORMAL LOW (ref 6–20)
CALCIUM: 8.9 mg/dL (ref 8.9–10.3)
CO2: 25 mmol/L (ref 22–32)
Chloride: 99 mmol/L — ABNORMAL LOW (ref 101–111)
Creatinine, Ser: 0.59 mg/dL (ref 0.44–1.00)
GFR calc Af Amer: >60 mL/min (ref >60)
GFR calc non Af Amer: >60 mL/min (ref >60)
GLUCOSE: 97 mg/dL (ref 65–99)
Potassium: 3.2 mmol/L — ABNORMAL LOW (ref 3.5–5.1)
SODIUM: 135 mmol/L (ref 135–145)
Total Bilirubin: 0.7 mg/dL (ref 0.3–1.2)
Total Protein: 7.4 g/dL (ref 6.5–8.1)

## 2014-06-07 LAB — URINE MICROSCOPIC-ADD ON

## 2014-06-07 LAB — URINALYSIS, ROUTINE W REFLEX MICROSCOPIC
Bilirubin Urine: NEGATIVE
Glucose, UA: NEGATIVE mg/dL
Ketones, ur: NEGATIVE mg/dL
LEUKOCYTES UA: NEGATIVE
NITRITE: NEGATIVE
Protein, ur: NEGATIVE mg/dL
Specific Gravity, Urine: 1.015 (ref 1.005–1.030)
UROBILINOGEN UA: 0.2 mg/dL (ref 0.0–1.0)
pH: 5 (ref 5.0–8.0)

## 2014-06-07 MED ORDER — OXYCODONE-ACETAMINOPHEN 5-325 MG PO TABS
1.0000 | ORAL_TABLET | Freq: Once | ORAL | Status: AC
  Administered 2014-06-07: 1 via ORAL
  Filled 2014-06-07: qty 1

## 2014-06-07 MED ORDER — PREDNISONE 10 MG PO TABS: 20.0000 mg | ORAL_TABLET | Freq: Every day | ORAL | Status: DC

## 2014-06-07 NOTE — Discharge Instructions (Signed)
Take the LIQUID MEDICATION AS DIRECTED FOR PAIN.  RETURN AS NEEDED.   Cool Mist Vaporizers Vaporizers may help relieve the symptoms of a cough and cold. They add moisture to the air, which helps mucus to become thinner and less sticky. This makes it easier to breathe and cough up secretions. Cool mist vaporizers do not cause serious burns like hot mist vaporizers, which may also be called steamers or humidifiers. Vaporizers have not been proven to help with colds. You should not use a vaporizer if you are allergic to mold. HOME CARE INSTRUCTIONS  Follow the package instructions for the vaporizer.  Do not use anything other than distilled water in the vaporizer.  Do not run the vaporizer all of the time. This can cause mold or bacteria to grow in the vaporizer.  Clean the vaporizer after each time it is used.  Clean and dry the vaporizer well before storing it.  Stop using the vaporizer if worsening respiratory symptoms develop. Document Released: 10/04/2003 Document Revised: 01/11/2013 Document Reviewed: 05/26/2012 [facility] Patient Information 2015 [place], [place]. This information is not intended to replace advice given to you by your health care provider. Make sure you discuss any questions you have with your health care provider.  Cough, Adult  A cough is a reflex. It helps you clear your throat and airways. A cough can help heal your body. A cough can last 2 or 3 weeks (acute) or may last more than 8 weeks (chronic). Some common causes of a cough can include an infection, allergy, or a cold. HOME CARE  Only take medicine as told by your doctor.  If given, take your medicines (antibiotics) as told. Finish them even if you start to feel better.  Use a cold steam vaporizer or humidifier in your home. This can help loosen thick spit (secretions).  Sleep so you are almost sitting up (semi-upright). Use pillows to do this. This helps reduce coughing.  Rest as needed.  Stop smoking if  you smoke. GET HELP RIGHT AWAY IF:  You have yellowish-white fluid (pus) in your thick spit.  Your cough gets worse.  Your medicine does not reduce coughing, and you are losing sleep.  You cough up blood.  You have trouble breathing.  Your pain gets worse and medicine does not help.  You have a fever. MAKE SURE YOU:   Understand these instructions.  Will watch your condition.  Will get help right away if you are not doing well or get worse. Document Released: 09/19/2010 Document Revised: 05/23/2013 Document Reviewed: 09/19/2010 [facility] Patient Information 2015 [place], [place]. This information is not intended to replace advice given to you by your health care provider. Make sure you discuss any questions you have with your health care provider.

## 2014-06-07 NOTE — ED Notes (Signed)
Patient presents today (3rd time) for c/o sore throat, fever, cough.  Has been dx with viral illness and streph throat.

## 2014-06-07 NOTE — ED Provider Notes (Signed)
CSN: [id number]     Arrival date & time 06/07/14  1859 History  This chart was scribed for Misty Baller, NP working with Misty Chapel, MD by Misty Murillo, ED Scribe. This patient was seen in room TR03C/TR03C and the patient's care was started at 8:36 PM.   Chief Complaint  Patient presents with  . Sore Throat  . Cough  . Fever   The history is provided by the patient. No language interpreter was used.   HPI Comments: Misty Murillo is a 24 y.o. female who presents to the Emergency Department complaining of unresolved URI symptoms including sore throat, fever and cough. Patient evaluated twice this week for similar complaints. On initial encounter, 5/15, patient diagnosed with viral infection and discharged with Hycet, ibuprofen and Zofran. At her second visit patient treated empirically for Strep throat with injection Bicillin and Decadron. Today patient reports that her symptoms have not improved; in additional to her sore, swollen throat she reports, bilateral ear pain, productive cough, diarrhea and fever. Patient reports treatment of her pain with Tylenol at home. She has not filled her Rx for pain yet.   History reviewed. No pertinent past medical history. History reviewed. No pertinent past surgical history. No family history on file. History  Substance Use Topics  . Smoking status: Never Smoker   . Smokeless tobacco: Never Used  . Alcohol Use: Yes     Comment: rarely    OB History    Gravida Para Term Preterm AB TAB SAB Ectopic Multiple Living   3 1 1  0 0 0 0 0 0 1     Review of Systems  Constitutional: Positive for fever and chills.  HENT: Positive for ear pain and sore throat. Negative for congestion, rhinorrhea and trouble swallowing.   Eyes: Negative for visual disturbance.  Respiratory: Positive for cough. Negative for wheezing.   Cardiovascular: Positive for chest pain (with cough).  Gastrointestinal: Positive for diarrhea. Negative for nausea, vomiting and abdominal pain.   Endocrine: Negative for polyuria.  Genitourinary: Negative for dysuria and hematuria.  Musculoskeletal: Negative for back pain and joint swelling.  Skin: Negative for rash.  Allergic/Immunologic: Negative for immunocompromised state.  Neurological: Negative for headaches.  Hematological: Does not bruise/bleed easily.  Psychiatric/Behavioral: Negative for confusion.      Allergies  Review of patient's allergies indicates no known allergies.  Home Medications   Prior to Admission medications   Medication Sig Start Date End Date Taking? Authorizing Provider  dextromethorphan-guaiFENesin (MUCINEX DM) 30-600 MG per 12 hr tablet Take 1 tablet by mouth 2 (two) times daily as needed for cough.   Yes Historical Provider, MD  ibuprofen (ADVIL,MOTRIN) 800 MG tablet Take 1 tablet (800 mg total) by mouth every 8 (eight) hours as needed for mild pain or moderate pain. 06/04/14  Yes Clayton Bibles, PA-C  ALPRAZolam (XANAX) 0.25 MG tablet Take 1 tablet (0.25 mg total) by mouth 3 (three) times daily as needed for anxiety. Patient not taking: Reported on 06/04/2014 04/12/14   [name], MD  HYDROcodone-acetaminophen (HYCET) 7.5-325 mg/15 ml solution Take 10 mLs by mouth 4 (four) times daily as needed for moderate pain or severe pain. 06/04/14   Clayton Bibles, PA-C  ondansetron (ZOFRAN) 4 MG tablet Take 1 tablet (4 mg total) by mouth every 8 (eight) hours as needed for nausea or vomiting. 06/04/14   Clayton Bibles, PA-C  predniSONE (DELTASONE) 10 MG tablet Take 2 tablets (20 mg total) by mouth daily. 06/07/14   [place],  NP   Triage Vitals: BP 145/87 mmHg  Pulse 114  Temp(Src) 101 F (38.3 C) (Oral)  Resp 16  SpO2 97%  LMP 06/04/2014 Physical Exam  Constitutional: She is oriented to person, place, and time. She appears well-developed and well-nourished. No distress.  HENT:  Head: Normocephalic.  Right Ear: Tympanic membrane normal.  Left Ear: Tympanic membrane normal.  Nose: Mucosal edema present.   Mouth/Throat: Uvula is midline. Posterior oropharyngeal erythema present.  Tonsils enlarged with exuadate bilaterally. TMs are clear.   Eyes: EOM are normal.  Neck: Neck supple.  Cardiovascular: Regular rhythm.  Tachycardia present.   Pulmonary/Chest: Effort normal and breath sounds normal. No respiratory distress. She has no wheezes. She has no rales. She exhibits no tenderness.  Abdominal: Soft. Bowel sounds are normal. There is no tenderness.  Musculoskeletal: Normal range of motion.  Lymphadenopathy:    She has cervical adenopathy.  Neurological: She is alert and oriented to person, place, and time. No cranial nerve deficit.  Skin: Skin is warm and dry.  Psychiatric: She has a normal mood and affect. Her behavior is normal.  Nursing note and vitals reviewed.   ED Course  Procedures (including critical care time) Labs, cxr, pain management Patient able to swallow hydrocodone without difficulty.    COORDINATION OF CARE: 8:43 PM- Discussed treatment plan with patient at bedside and patient agreed to plan.  Results for orders placed or performed during the hospital encounter of 06/07/14 (from the past 24 hour(s))  CBC WITH DIFFERENTIAL     Status: Abnormal   Collection Time: 06/07/14  7:21 PM  Result Value Ref Range   WBC 6.9 4.0 - 10.5 K/uL   RBC 4.88 3.87 - 5.11 MIL/uL   Hemoglobin 12.5 12.0 - 15.0 g/dL   HCT 35.3 (L) 36.0 - 46.0 %   MCV 72.3 (L) 78.0 - 100.0 fL   MCH 25.6 (L) 26.0 - 34.0 pg   MCHC 35.4 30.0 - 36.0 g/dL   RDW 12.6 11.5 - 15.5 %   Platelets 258 150 - 400 K/uL   Neutrophils Relative % 61 43 - 77 %   Neutro Abs 4.2 1.7 - 7.7 K/uL   Lymphocytes Relative 25 12 - 46 %   Lymphs Abs 1.7 0.7 - 4.0 K/uL   Monocytes Relative 15 (H) 3 - 12 %   Monocytes Absolute 1.0 0.1 - 1.0 K/uL   Eosinophils Relative 0 0 - 5 %   Eosinophils Absolute 0.0 0.0 - 0.7 K/uL   Basophils Relative 0 0 - 1 %   Basophils Absolute 0.0 0.0 - 0.1 K/uL   Smear Review MORPHOLOGY  UNREMARKABLE   Comprehensive metabolic panel     Status: Abnormal   Collection Time: 06/07/14  7:21 PM  Result Value Ref Range   Sodium 135 135 - 145 mmol/L   Potassium 3.2 (L) 3.5 - 5.1 mmol/L   Chloride 99 (L) 101 - 111 mmol/L   CO2 25 22 - 32 mmol/L   Glucose, Bld 97 65 - 99 mg/dL   BUN 5 (L) 6 - 20 mg/dL   Creatinine, Ser 0.59 0.44 - 1.00 mg/dL   Calcium 8.9 8.9 - 10.3 mg/dL   Total Protein 7.4 6.5 - 8.1 g/dL   Albumin 3.3 (L) 3.5 - 5.0 g/dL   AST 22 15 - 41 U/L   ALT 28 14 - 54 U/L   Alkaline Phosphatase 96 38 - 126 U/L   Total Bilirubin 0.7 0.3 - 1.2 mg/dL   GFR  calc non Af Amer >60 >60 mL/min   GFR calc Af Amer >60 >60 mL/min   Anion gap 11 5 - 15  Urinalysis with microscopic     Status: Abnormal   Collection Time: 06/07/14  8:25 PM  Result Value Ref Range   Color, Urine AMBER (A) YELLOW   APPearance TURBID (A) CLEAR   Specific Gravity, Urine 1.015 1.005 - 1.030   pH 5.0 5.0 - 8.0   Glucose, UA NEGATIVE NEGATIVE mg/dL   Hgb urine dipstick SMALL (A) NEGATIVE   Bilirubin Urine NEGATIVE NEGATIVE   Ketones, ur NEGATIVE NEGATIVE mg/dL   Protein, ur NEGATIVE NEGATIVE mg/dL   Urobilinogen, UA 0.2 0.0 - 1.0 mg/dL   Nitrite NEGATIVE NEGATIVE   Leukocytes, UA NEGATIVE NEGATIVE  Urine microscopic-add on     Status: Abnormal   Collection Time: 06/07/14  8:25 PM  Result Value Ref Range   Squamous Epithelial / LPF RARE RARE   WBC, UA 0-2 <3 WBC/hpf   RBC / HPF 3-6 <3 RBC/hpf   Bacteria, UA FEW (A) RARE   Casts GRANULAR CAST (A) NEGATIVE   Urine-Other MUCOUS PRESENT     Imaging Review Dg Chest 2 View  06/07/2014   CLINICAL DATA:  Cough, fever and sore throat for 4 days.  EXAM: CHEST  2 VIEW  COMPARISON:  PA and lateral chest 12/03/2013.  FINDINGS: Heart size and mediastinal contours are within normal limits. Both lungs are clear. Visualized skeletal structures are unremarkable.  IMPRESSION: Negative exam.   Electronically Signed   By: Inge Rise M.D.   On: 06/07/2014  21:36   Patient feeling better after pain medication.   MDM  24 y.o. female with sore throat, enlarged tonsils with exudate, cough, congestion stable for d/c without difficulty swallowing and without respiratory distress. O2 SAT 99% on R/A. Patient treated on previous visit with Bicillin 1.2 MU IM. She agrees to get her pain medication filled tonight that she received on her first visit. Will treat with short course of prednisone. She will return as needed. Discussed with the patient and all questioned fully answered.   Final diagnoses:  Tonsillitis with exudate  Cough  I personally performed the services described in this documentation, which was scribed in my presence. The recorded information has been reviewed and is accurate.    [address] [place], [place] 06/07/14 8527  Misty Chapel, MD 06/08/14 1004

## 2014-07-02 ENCOUNTER — Encounter (HOSPITAL_COMMUNITY): Payer: Self-pay | Admitting: Emergency Medicine

## 2014-07-02 ENCOUNTER — Emergency Department (INDEPENDENT_AMBULATORY_CARE_PROVIDER_SITE_OTHER)
Admission: EM | Admit: 2014-07-02 | Discharge: 2014-07-02 | Disposition: A | Discharge status: Home / Self Care | Payer: Medicaid Other | Source: Home / Self Care | Attending: Emergency Medicine | Admitting: Emergency Medicine

## 2014-07-02 DIAGNOSIS — W19XXXA Unspecified fall, initial encounter: Secondary | ICD-10-CM

## 2014-07-02 DIAGNOSIS — M25561 Pain in right knee: Secondary | ICD-10-CM | POA: Diagnosis not present

## 2014-07-02 NOTE — ED Provider Notes (Signed)
CSN: [id number]     Arrival date & time 07/02/14  1847 History   First MD Initiated Contact with Patient 07/02/14 1932     Chief Complaint  Patient presents with  . Knee Injury   (Consider location/radiation/quality/duration/timing/severity/associated sxs/prior Treatment) HPI Comments: 24 year old female states that she was walking her clients dog yesterday and fell onto her right knee. She experienced minor discomfort at the time but the is comfort spontaneously abated. She told her supervisor about it and was advised today that she needed to be evaluated. She has no complaints. Her knee is completely asymptomatic. She has a normal gait.   History reviewed. No pertinent past medical history. History reviewed. No pertinent past surgical history. No family history on file. History  Substance Use Topics  . Smoking status: Never Smoker   . Smokeless tobacco: Never Used  . Alcohol Use: Yes     Comment: rarely    OB History    Gravida Para Term Preterm AB TAB SAB Ectopic Multiple Living   3 1 1  0 0 0 0 0 0 1     Review of Systems  Constitutional: Negative.   Musculoskeletal: Negative.   Skin: Negative.   Neurological: Negative.   All other systems reviewed and are negative.   Allergies  Review of patient's allergies indicates no known allergies.  Home Medications   Prior to Admission medications   Medication Sig Start Date End Date Taking? Authorizing Provider  ALPRAZolam (XANAX) 0.25 MG tablet Take 1 tablet (0.25 mg total) by mouth 3 (three) times daily as needed for anxiety. Patient not taking: Reported on 06/04/2014 04/12/14   [name], MD  dextromethorphan-guaiFENesin [facility] DM) 30-600 MG per 12 hr tablet Take 1 tablet by mouth 2 (two) times daily as needed for cough.    Historical Provider, MD  HYDROcodone-acetaminophen (HYCET) 7.5-325 mg/15 ml solution Take 10 mLs by mouth 4 (four) times daily as needed for moderate pain or severe pain. 06/04/14   Clayton Bibles, PA-C   ibuprofen (ADVIL,MOTRIN) 800 MG tablet Take 1 tablet (800 mg total) by mouth every 8 (eight) hours as needed for mild pain or moderate pain. 06/04/14   Clayton Bibles, PA-C  ondansetron (ZOFRAN) 4 MG tablet Take 1 tablet (4 mg total) by mouth every 8 (eight) hours as needed for nausea or vomiting. 06/04/14   Clayton Bibles, PA-C  predniSONE (DELTASONE) 10 MG tablet Take 2 tablets (20 mg total) by mouth daily. 06/07/14   Hope Bunnie Pion, NP   BP 128/80 mmHg  Pulse 91  Temp(Src) 98.2 F (36.8 C) (Oral)  Resp 16  SpO2 99%  LMP 06/04/2014 Physical Exam  Constitutional: She is oriented to person, place, and time. She appears well-developed and well-nourished. No distress.  Musculoskeletal:  Right knee with no swelling, discoloration, deformity or skin lesions. No tenderness to palpation. No evidence of effusion. Full range of motion. Flexion and extension completely intact. Negative laxity. Negative drawer, negative varus, negative valgus. Ambulation smooth and balanced, no limp. Able to bear full weight. The exam is unremarkable. Function is normal.  Neurological: She is alert and oriented to person, place, and time. She exhibits normal muscle tone.  Skin: Skin is warm and dry.  Nursing note and vitals reviewed.   ED Course  Procedures (including critical care time) Labs Review Labs Reviewed - No data to display  Imaging Review No results found.   MDM   1. Fall, initial encounter   2. Knee pain, right    Unremarkable  knee exam. No abnormal findings. Function is normal. May return to work tomorrow.    Janne Napoleon, NP 07/02/14 2011

## 2014-07-02 NOTE — Discharge Instructions (Signed)
Normal knee exam. Fall Prevention and Home Safety Falls cause injuries and can affect all age groups. It is possible to prevent falls.  HOW TO PREVENT FALLS  Wear shoes with rubber soles that do not have an opening for your toes.  Keep the inside and outside of your house well lit.  Use night lights throughout your home.  Remove clutter from floors.  Clean up floor spills.  Remove throw rugs or fasten them to the floor with carpet tape.  Do not place electrical cords across pathways.  Put grab bars by your tub, shower, and toilet. Do not use towel bars as grab bars.  Put handrails on both sides of the stairway. Fix loose handrails.  Do not climb on stools or stepladders, if possible.  Do not wax your floors.  Repair uneven or unsafe sidewalks, walkways, or stairs.  Keep items you use a lot within reach.  Be aware of pets.  Keep emergency numbers next to the telephone.  Put smoke detectors in your home and near bedrooms. Ask your doctor what other things you can do to prevent falls. Document Released: 11/02/2008 Document Revised: 07/08/2011 Document Reviewed: 04/08/2011 [facility] Patient Information 2015 [place], [place]. This information is not intended to replace advice given to you by your health care provider. Make sure you discuss any questions you have with your health care provider.

## 2014-07-02 NOTE — ED Notes (Signed)
Patient reports a fall yesterday while at work.  Supervisor notified.  Patient wrapped knee, applied ice.  Patient requests a note to return to work.  Employer is requesting a note saying it is ok for patient to work. Patient reports no pain in right knee

## 2014-10-18 ENCOUNTER — Emergency Department (INDEPENDENT_AMBULATORY_CARE_PROVIDER_SITE_OTHER)
Admission: EM | Admit: 2014-10-18 | Discharge: 2014-10-18 | Disposition: A | Discharge status: Home / Self Care | Payer: Medicaid Other | Source: Home / Self Care | Attending: Family Medicine | Admitting: Family Medicine

## 2014-10-18 ENCOUNTER — Encounter (HOSPITAL_COMMUNITY): Payer: Self-pay | Admitting: Family Medicine

## 2014-10-18 DIAGNOSIS — G43019 Migraine without aura, intractable, without status migrainosus: Secondary | ICD-10-CM | POA: Diagnosis not present

## 2014-10-18 HISTORY — DX: Migraine, unspecified, not intractable, without status migrainosus: G43.909

## 2014-10-18 MED ORDER — DEXAMETHASONE 4 MG PO TABS
ORAL_TABLET | ORAL | Status: AC
  Filled 2014-10-18: qty 2

## 2014-10-18 MED ORDER — ONDANSETRON HCL 4 MG PO TABS: 4.0000 mg | ORAL_TABLET | Freq: Three times a day (TID) | ORAL | Status: DC | PRN

## 2014-10-18 MED ORDER — ACETAMINOPHEN 325 MG PO TABS
975.0000 mg | ORAL_TABLET | Freq: Once | ORAL | Status: AC
  Administered 2014-10-18: 975 mg via ORAL

## 2014-10-18 MED ORDER — ACETAMINOPHEN 325 MG PO TABS
ORAL_TABLET | ORAL | Status: AC
  Filled 2014-10-18: qty 3

## 2014-10-18 MED ORDER — SUMATRIPTAN SUCCINATE 6 MG/0.5ML ~~LOC~~ SOLN
6.0000 mg | Freq: Once | SUBCUTANEOUS | Status: AC
  Administered 2014-10-18: 6 mg via SUBCUTANEOUS

## 2014-10-18 MED ORDER — SUMATRIPTAN SUCCINATE 6 MG/0.5ML ~~LOC~~ SOLN
SUBCUTANEOUS | Status: AC
  Filled 2014-10-18: qty 0.5

## 2014-10-18 MED ORDER — DEXAMETHASONE 4 MG PO TABS
10.0000 mg | ORAL_TABLET | Freq: Once | ORAL | Status: AC
  Administered 2014-10-18: 10 mg via ORAL

## 2014-10-18 MED ORDER — DEXAMETHASONE 2 MG PO TABS
ORAL_TABLET | ORAL | Status: AC
  Filled 2014-10-18: qty 1

## 2014-10-18 NOTE — Discharge Instructions (Signed)
Your given several medicines to reverse her headache symptoms. Please go home and take the Zofran as prescribed as well as 25-50 mg of Benadryl. Please room for follow-up with a primary care physician regarding more long-term therapy.

## 2014-10-18 NOTE — ED Provider Notes (Signed)
CSN: [id number]     Arrival date & time 10/18/14  1538 History   First MD Initiated Contact with Patient 10/18/14 1720     Chief Complaint  Patient presents with  . Migraine   (Consider location/radiation/quality/duration/timing/severity/associated sxs/prior Treatment) HPI  History of migraines. Present for years. Patient states that she can have up to 4 migraines per day. Typically last just a short period time and are relieved with BC powder. Patient states that current episode started 4 days ago and has been constant. Associated with nausea, photophobia phonophobia. States that this is her typical headache located in the front to right-sided forehead but is just not been able to break it. Unsure of what started this headache. Patient has no primary care physician or neurologist who manages her migraines. States that she cut back significantly on her soda and caffeine intake approximately 2 weeks ago. Denies any confusion, loss of vision, change in sensation, unilateral weakness, fevers, shortness breath, chest pain.  Past Medical History  Diagnosis Date  . Migraines    History reviewed. No pertinent past surgical history. Family History  Problem Relation Age of Onset  . Diabetes Mother   . Migraines Father   . Cancer Other    Social History  Substance Use Topics  . Smoking status: Never Smoker   . Smokeless tobacco: Never Used  . Alcohol Use: Yes     Comment: rarely    OB History    Gravida Para Term Preterm AB TAB SAB Ectopic Multiple Living   3 1 1  0 0 0 0 0 0 1     Review of Systems Per HPI with all other pertinent systems negative.   Allergies  Review of patient's allergies indicates no known allergies.  Home Medications   Prior to Admission medications   Medication Sig Start Date End Date Taking? Authorizing Provider  ibuprofen (ADVIL,MOTRIN) 800 MG tablet Take 1 tablet (800 mg total) by mouth every 8 (eight) hours as needed for mild pain or moderate pain. 06/04/14    Clayton Bibles, PA-C  ondansetron (ZOFRAN) 4 MG tablet Take 1-2 tablets (4-8 mg total) by mouth every 8 (eight) hours as needed for nausea or vomiting. 10/18/14   Waldemar Dickens, MD   Meds Ordered and Administered this Visit   Medications  SUMAtriptan (IMITREX) injection 6 mg (6 mg Subcutaneous Given 10/18/14 1739)  dexamethasone (DECADRON) tablet 10 mg (10 mg Oral Given 10/18/14 1738)  acetaminophen (TYLENOL) tablet 975 mg (975 mg Oral Given 10/18/14 1738)    BP 143/83 mmHg  Pulse 88  Temp(Src) 98.3 F (36.8 C) (Oral)  Resp 20  SpO2 99%  LMP 10/06/2014 (Exact Date) No data found.   Physical Exam Physical Exam  Constitutional: oriented to person, place, and time. appears well-developed and well-nourished. No distress.  HENT:  Head: Normocephalic and atraumatic.  Eyes: EOMI. PERRL.  Neck: Normal range of motion.  Cardiovascular: RRR, no m/r/g, 2+ distal pulses,  Pulmonary/Chest: Effort normal and breath sounds normal. No respiratory distress.  Abdominal: Soft. Bowel sounds are normal. NonTTP, no distension.  Musculoskeletal: Normal range of motion. Non ttp, no effusion.  Neurological: alert and oriented to person, place, and time.  Skin: Skin is warm. No rash noted. non diaphoretic.  Psychiatric: normal mood and affect. behavior is normal. Judgment and thought content normal.   ED Course  Procedures (including critical care time)  Labs Review Labs Reviewed - No data to display  Imaging Review No results found.   Visual Acuity  Review  Right Eye Distance:   Left Eye Distance:   Bilateral Distance:    Right Eye Near:   Left Eye Near:    Bilateral Near:         MDM   1. Intractable migraine without aura and without status migrainosus    Imitrex 6 mg [place], Decadron 10 mg by mouth, Tylenol 975 mg by mouth. Rx for Zofran given to be taken at home along with Benadryl. Good ED if worsening. Strongly counseled patient to follow-up with a primary care physician or  neurologist she would likely greatly benefit from migraine management on a medicine such as a beta blocker, Topamax or other agent.    Waldemar Dickens, MD 10/18/14 [phone]

## 2014-10-18 NOTE — ED Notes (Signed)
Pt has had migraine symptoms for two weeks, but in the last five days she has had a constant headache for the last 5 days that her usual medications will not relieve.

## 2014-11-08 ENCOUNTER — Encounter (HOSPITAL_COMMUNITY): Payer: Self-pay | Admitting: Neurology

## 2014-11-08 ENCOUNTER — Emergency Department (HOSPITAL_COMMUNITY)
Admission: EM | Admit: 2014-11-08 | Discharge: 2014-11-08 | Disposition: A | Discharge status: Home / Self Care | Payer: Medicaid Other | Attending: Emergency Medicine | Admitting: Emergency Medicine

## 2014-11-08 DIAGNOSIS — R21 Rash and other nonspecific skin eruption: Secondary | ICD-10-CM | POA: Diagnosis present

## 2014-11-08 DIAGNOSIS — L508 Other urticaria: Secondary | ICD-10-CM

## 2014-11-08 DIAGNOSIS — Z720 Tobacco use: Secondary | ICD-10-CM | POA: Diagnosis not present

## 2014-11-08 DIAGNOSIS — R0602 Shortness of breath: Secondary | ICD-10-CM | POA: Diagnosis not present

## 2014-11-08 DIAGNOSIS — L509 Urticaria, unspecified: Secondary | ICD-10-CM | POA: Insufficient documentation

## 2014-11-08 DIAGNOSIS — R1310 Dysphagia, unspecified: Secondary | ICD-10-CM | POA: Insufficient documentation

## 2014-11-08 DIAGNOSIS — Z8679 Personal history of other diseases of the circulatory system: Secondary | ICD-10-CM | POA: Diagnosis not present

## 2014-11-08 MED ORDER — DEXAMETHASONE SODIUM PHOSPHATE 10 MG/ML IJ SOLN
10.0000 mg | Freq: Once | INTRAMUSCULAR | Status: AC
  Administered 2014-11-08: 10 mg via INTRAMUSCULAR
  Filled 2014-11-08: qty 1

## 2014-11-08 MED ORDER — EPINEPHRINE 0.3 MG/0.3ML IJ SOAJ: 0.3000 mg | Freq: Once | INTRAMUSCULAR | Status: DC

## 2014-11-08 NOTE — Discharge Instructions (Signed)
You have been seen today for chronic hives from an unknown cause. You were given a steroid injection to help the benadryl's effects.  You are advised to follow up with an allergist or PCP as soon as possible.  You are being given an EpiPen that is to be used only as a lifesaving treatment if you can not swallow benadryl.  You must come to the ED if you use your EpiPen. Return to ED should symptoms worsen.    Hives Hives are itchy, red, swollen areas of the skin. They can vary in size and location on your body. Hives can come and go for hours or several days (acute hives) or for several weeks (chronic hives). Hives do not spread from person to person (noncontagious). They may get worse with scratching, exercise, and emotional stress. CAUSES   Allergic reaction to food, additives, or drugs.  Infections, including the common cold.  Illness, such as vasculitis, lupus, or thyroid disease.  Exposure to sunlight, heat, or cold.  Exercise.  Stress.  Contact with chemicals. SYMPTOMS   Red or white swollen patches on the skin. The patches may change size, shape, and location quickly and repeatedly.  Itching.  Swelling of the hands, feet, and face. This may occur if hives develop deeper in the skin. DIAGNOSIS  Your caregiver can usually tell what is wrong by performing a physical exam. Skin or blood tests may also be done to determine the cause of your hives. In some cases, the cause cannot be determined. TREATMENT  Mild cases usually get better with medicines such as antihistamines. Severe cases may require an emergency epinephrine injection. If the cause of your hives is known, treatment includes avoiding that trigger.  HOME CARE INSTRUCTIONS   Avoid causes that trigger your hives.  Take antihistamines as directed by your caregiver to reduce the severity of your hives. Non-sedating or low-sedating antihistamines are usually recommended. Do not drive while taking an antihistamine.  Take  any other medicines prescribed for itching as directed by your caregiver.  Wear loose-fitting clothing.  Keep all follow-up appointments as directed by your caregiver. SEEK MEDICAL CARE IF:   You have persistent or severe itching that is not relieved with medicine.  You have painful or swollen joints. SEEK IMMEDIATE MEDICAL CARE IF:   You have a fever.  Your tongue or lips are swollen.  You have trouble breathing or swallowing.  You feel tightness in the throat or chest.  You have abdominal pain. These problems may be the first sign of a life-threatening allergic reaction. Call your local emergency services (911 in U.S.). MAKE SURE YOU:   Understand these instructions.  Will watch your condition.  Will get help right away if you are not doing well or get worse.   This information is not intended to replace advice given to you by your health care provider. Make sure you discuss any questions you have with your health care provider.   Document Released: 01/06/2005 Document Revised: 01/11/2013 Document Reviewed: 04/01/2011 Elsevier Interactive Patient Education 2016 Elsevier Inc.  Angioedema Angioedema is a sudden swelling of tissues, often of the skin. It can occur on the face or genitals or in the abdomen or other body parts. The swelling usually develops over a short period and gets better in 24 to 48 hours. It often begins during the night and is found when the person wakes up. The person may also get red, itchy patches of skin (hives). Angioedema can be dangerous if it involves  swelling of the air passages.  Depending on the cause, episodes of angioedema may only happen once, come back in unpredictable patterns, or repeat for several years and then gradually fade away.  CAUSES  Angioedema can be caused by an allergic reaction to various triggers. It can also result from nonallergic causes, including reactions to drugs, immune system disorders, viral infections, or an  abnormal gene that is passed to you from your parents (hereditary). For some people with angioedema, the cause is unknown.  Some things that can trigger angioedema include:   Foods.   Medicines, such as ACE inhibitors, ARBs, nonsteroidal anti-inflammatory agents, or estrogen.   Latex.   Animal saliva.   Insect stings.   Dyes used in X-rays.   Mild injury.   Dental work.  Surgery.  Stress.   Sudden changes in temperature.   Exercise. SIGNS AND SYMPTOMS   Swelling of the skin.  Hives. If these are present, there is also intense itching.  Redness in the affected area.   Pain in the affected area.  Swollen lips or tongue.  Breathing problems. This may happen if the air passages swell.  Wheezing. If internal organs are involved, there may be:   Nausea.   Abdominal pain.   Vomiting.   Difficulty swallowing.   Difficulty passing urine. DIAGNOSIS   Your health care provider will examine the affected area and take a medical and family history.  Various tests may be done to help determine the cause. Tests may include:  Allergy skin tests to see if the problem is an allergic reaction.   Blood tests to check for hereditary angioedema.   Tests to check for underlying diseases that could cause the condition.   A review of your medicines, including over-the-counter medicines, may be done. TREATMENT  Treatment will depend on the cause of the angioedema. Possible treatments include:   Removal of anything that triggered the condition (such as stopping certain medicines).   Medicines to treat symptoms or prevent attacks. Medicines given may include:   Antihistamines.   Epinephrine injection.   Steroids.   Hospitalization may be required for severe attacks. If the air passages are affected, it can be an emergency. Tubes may need to be placed to keep the airway open. HOME CARE INSTRUCTIONS   Take all medicines as directed by your  health care provider.  If you were given medicines for emergency allergy treatment, always carry them with you.  Wear a medical bracelet as directed by your health care provider.   Avoid known triggers. SEEK MEDICAL CARE IF:   You have repeat attacks of angioedema.   Your attacks are more frequent or more severe despite preventive measures.   You have hereditary angioedema and are considering having children. It is important to discuss with your health care provider the risks of passing the condition on to your children. SEEK IMMEDIATE MEDICAL CARE IF:   You have severe swelling of the mouth, tongue, or lips.  You have difficulty breathing.   You have difficulty swallowing.   You faint. MAKE SURE YOU:  Understand these instructions.  Will watch your condition.  Will get help right away if you are not doing well or get worse.   This information is not intended to replace advice given to you by your health care provider. Make sure you discuss any questions you have with your health care provider.   Document Released: 03/17/2001 Document Revised: 01/27/2014 Document Reviewed: 08/30/2012 Elsevier Interactive Patient Education [employer].  Emergency Department Resource Guide 1) Find a Doctor and Pay Out of Pocket Although you won't have to find out who is covered by your insurance plan, it is a good idea to ask around and get recommendations. You will then need to call the office and see if the doctor you have chosen will accept you as a new patient and what types of options they offer for patients who are self-pay. Some doctors offer discounts or will set up payment plans for their patients who do not have insurance, but you will need to ask so you aren't surprised when you get to your appointment.  2) Contact Your Local Health Department Not all health departments have doctors that can see patients for sick visits, but many do, so it is worth a call to see if  yours does. If you don't know where your local health department is, you can check in your phone book. The CDC also has a tool to help you locate your state's health department, and many state websites also have listings of all of their local health departments.  3) Find a [facility] If your illness is not likely to be very severe or complicated, you may want to try a walk in clinic. These are popping up all over the country in pharmacies, drugstores, and shopping centers. They're usually staffed by nurse practitioners or physician assistants that have been trained to treat common illnesses and complaints. They're usually fairly quick and inexpensive. However, if you have serious medical issues or chronic medical problems, these are probably not your best option.  No Primary Care Doctor: - Call Health Connect at  [phone] - they can help you locate a primary care doctor that  accepts your insurance, provides certain services, etc. - Physician Referral Service- [phone]  Chronic Pain Problems: Organization         Address  Phone   Notes  [facility]  [phone] Patients need to be referred by their primary care doctor.   Medication Assistance: Organization         Address  Phone   Notes  [facility] Medication [facility] [address] [place], [place] [address] [phone] --Must be a resident of [facility] -- Must have NO insurance coverage whatsoever (no Medicaid/ Medicare, etc.) -- The pt. MUST have a primary care doctor that directs their care regularly and follows them in the community   [facility]  [phone]   [employer]  [phone]    Agencies that provide inexpensive medical care: Organization         Address  Phone   Notes  [place]  [phone]   [facility]    [phone]   [facility] [address], [place] [address] [phone]    [place] [address], [place] [phone]   Planned Parenthood    [phone]   [facility]    [phone]   [place] and [address], [place] Phone:  [phone], Fax:  [phone] Hours of Operation:  9 am - 6 pm, M-F.  Also accepts Medicaid/Medicare and self-pay.  [facility] for [place], [address], [place] Phone: [phone], Fax: [phone]. Hours of Operation:  8:30 am - 5:30 pm, M-F.  Also accepts Medicaid and self-pay.  [facility]  [address], [employer] Phone: [phone]   [place], [place], [place] [phone], Ext. [phone] Mondays & Thursdays: 7-9 AM.  First 15 patients are seen on a first come, first serve basis.    [place] Providers:  Organization         Address  Phone   Notes  [facility] [address], [place] [phone] Also accepts self-pay patients.  [facility] [address], [place]  [phone]   [place], [address], [place] [phone]   [facility] [address], [place] [phone]   Lucianne Lei [address], [place]   [phone] Only accepts [place] Access [place] patients after they have their name applied to their card.   Self-Pay (no insurance) in [facility]:  Organization         Address  Phone   Notes  Sickle Cell Patients, [facility] [place] [phone]   [facility] Urgent Care [place] [phone]   [facility] [place]  [place], [place], [place] [phone]   [facility]/Dr. Osei-Bonsu  [address] [place] or [address], [place] [phone] Phone number for both [place] and [place] locations is the same.  [facility] [address] [place] [phone]   [facility] [address], [place] or [address] [phone]   [facility] [address], [place] [phone], phone; [phone], fax Sees patients 1st and 3rd Saturday of every month.  Must not qualify for public or private insurance (i.e. Medicaid, Medicare, [place] Health Choice, Veterans' Benefits)  Household income should be no more than 200% of the poverty level The clinic cannot treat you if you are pregnant or think you are pregnant  Sexually transmitted diseases are not treated at the clinic.    Dental Care: Organization         Address  Phone  Notes  [facility] Department of [facility] [place] [phone] Accepts children up to age 24 who are enrolled in [place] or [place]; pregnant women with a Medicaid card; and children who have applied for Medicaid or [place] Health Choice, but were declined, whose parents can pay a reduced fee at time of service.  [facility] Department of [facility]  [address], [place] [phone] Accepts children up to age 52 who are enrolled in [place] or [place]; pregnant women with a Medicaid card; and children who have applied for Medicaid or [place] Health Choice, but were declined, whose parents can pay a reduced fee at time of service.  [place] Adult [facility] Access PROGRAM  [place] [phone] Patients are seen by appointment only. Walk-ins are not accepted. [place] will see patients 36 years of age and older. Monday - Tuesday (8am-5pm) Most Wednesdays (8:30-5pm) $30 per visit, cash only  [facility] Adult Dental Access PROGRAM  [address], [facility] [phone] Patients are seen by  appointment only. Walk-ins are not accepted. [place] will see patients 20 years of age and older.  One Wednesday Evening (Monthly: Volunteer Based).  $30 per visit, cash only  [place]  [phone] for adults; Children under age 79, call Graduate Pediatric Dentistry at [phone]. Children aged 8-14, please call [phone] to request a pediatric application.  Dental services are provided in all areas of dental care including fillings, crowns and bridges, complete and partial dentures, implants, gum treatment, root canals, and extractions. Preventive care is also provided. Treatment is provided to both adults and children. Patients are selected via a lottery and there is often a waiting list.   [facility] [address] [place]  [phone] [email or web address]   [facility] [address] [place], [place] [phone], Ext. [phone] Second and Fourth Thursday of each month, opens at 6:30 AM; Clinic ends at 9 AM.  Patients are seen on a first-come first-served basis, and a limited number are seen during each clinic.   [facility]  [address] Hillard Danker [place], [place] [phone]   Eligibility Requirements You must have lived in [place], [place], or [place] counties for at least the last three months.   You cannot be eligible for state or federal sponsored [employer], including [employer], [place], or [employer].   You generally cannot be eligible for healthcare insurance through your employer.    How to apply: Eligibility screenings are held every Tuesday and Wednesday afternoon from 1:00 pm until 4:00 pm. You do not need an appointment for the interview!  [facility] [address], [place], [place]   [place]  [place] Department  [place]  [phone]     Behavioral Health Resources in the Community: Intensive Outpatient Programs Organization         Address  Phone  Notes  [place]. [address], [place], [place] [phone]   [facility] [address] [place], [place]   ADS: [facility] [address], [place], [place]   [place] [address]  [place], [place] or [phone]   Substance Abuse Resources Organization         Address  Phone  Notes  Al[facility] Drug Services  [phone]   [place]  [phone]   [facility] [place]   Chinita Pester  [phone]   Residential & Outpatient Substance Abuse Program  [phone]   Psychological Services Organization         Address  Phone  Notes  [facility] [place]  [place]  [phone]   [place] [address] [place] or [phone]    Mobile Crisis Teams Organization         Address  Phone  Notes  [facility], Mobile Crisis Care Unit  [phone]   [facility]  [address] [place], [place]   [place] Levels [address] [place] [phone]    Self-Help/Support Groups Organization         Address  Phone             Notes  [place]. of [place] - variety of support groups  [place] Call for more information  Narcotics Anonymous (NA), [facility] [address], [place]   2 meetings at this location  Residential Treatment Programs Organization         Address  Phone  Notes  [facility] [address],    [place]  [phone]   [facility]  [address], [place] [address], [place], [place]   [place], [place] [phone] Admissions: 8am-3pm M-F  Incentives  Substance [place] [address],    [place], [place] [phone]   [facility] [address] [place], [place], [place]   [facility] [address],  [place], [place]   [facility] - Intensive Outpatient [address], [place], [place]   [facility] ([place].) [address].,  [place], [place] [phone] or [phone]   [facility] Services (RTS) [address], [place], [place] Accepts Medicaid  [facility] [place] [address]  [place] [phone] Substance Abuse/Addiction Treatment   [facility] Organization         Address  Phone  Notes  [facility]  [phone]   Domenic Schwab, PhD [address] Arlis [place], [place]   [phone] or [phone]   [place], [place] [phone]   [facility] [address], [place], [place] [phone] Insurance/Medicaid/sponsorship through [facility] and Families [address] [place]                                    [place], [place] [phone] [place] [address], [place] [phone]    Dr. Adele Schilder  [phone]   [facility] Dept. 1) [address], [place] 2) [place] 3)  [place] [address], [place] [phone]  [phone]   [place] [phone] or [phone] (After Hours)

## 2014-11-08 NOTE — ED Provider Notes (Signed)
CSN: [id number]     Arrival date & time 11/08/14  1415 History  By signing my name below, I, Misty Murillo, attest that this documentation has been prepared under the direction and in the presence of Arlean Hopping, PA-C Electronically Signed: Ladene Artist, ED Scribe 11/08/2014 at 2:47 PM.   Chief Complaint  Patient presents with  . Urticaria   The history is provided by the patient. No language interpreter was used.   HPI Comments: Misty Murillo is a 24 y.o. female who presents to the Emergency Department complaining of acute onset of chronic pruritic rash to her neck, chest and back with an acute occurence yesterday. Pt initially noticed rash yesterday around 12 PM while at work. She reports similar rash every summer for the past 4 years. However, she states that she woke up this morning with worsened urticaria, tongue swelling, lip swelling, swelling under her left eye, difficulty swallowing and SOB at rest. She has never experienced these symptoms with previous rash. Pt self-treated with 1 Benadryl tablet (25 mg) around 10 AM this morning with complete relief of all symptoms. She denies nausea and vomiting. She also denies new medications, detergents, pet or chemical exposure. No h/o Epi-Pen use or prescription. Currently denies N/V, shortness of breath, chest pain or tightness, trouble swallowing, swollen tongue or any other signs of allergic reaction.  Past Medical History  Diagnosis Date  . Migraines    History reviewed. No pertinent past surgical history. Family History  Problem Relation Age of Onset  . Diabetes Mother   . Migraines Father   . Cancer Other    Social History  Substance Use Topics  . Smoking status: Current Every Day Smoker  . Smokeless tobacco: Never Used  . Alcohol Use: Yes     Comment: rarely    OB History    Gravida Para Term Preterm AB TAB SAB Ectopic Multiple Living   3 1 1  0 0 0 0 0 0 1     Review of Systems  HENT: Positive for facial swelling and  trouble swallowing.   Respiratory: Positive for shortness of breath. Negative for cough, chest tightness and wheezing.   Cardiovascular: Negative for palpitations.  Gastrointestinal: Negative for nausea, vomiting and abdominal pain.  Musculoskeletal: Negative for myalgias, arthralgias and neck stiffness.  Skin: Positive for rash.  Neurological: Negative for dizziness, syncope, speech difficulty, weakness, light-headedness, numbness and headaches.  All other systems reviewed and are negative.  Allergies  Review of patient's allergies indicates no known allergies.  Home Medications   Prior to Admission medications   Medication Sig Start Date End Date Taking? Authorizing Provider  EPINEPHrine 0.3 mg/0.3 mL IJ SOAJ injection Inject 0.3 mLs (0.3 mg total) into the muscle once. 11/08/14   Dorthula Bier C Anaisabel Pederson, PA-C  ibuprofen (ADVIL,MOTRIN) 800 MG tablet Take 1 tablet (800 mg total) by mouth every 8 (eight) hours as needed for mild pain or moderate pain. 06/04/14   Clayton Bibles, PA-C  ondansetron (ZOFRAN) 4 MG tablet Take 1-2 tablets (4-8 mg total) by mouth every 8 (eight) hours as needed for nausea or vomiting. 10/18/14   Waldemar Dickens, MD   BP 139/70 mmHg  Pulse 89  Temp(Src) 98.3 F (36.8 C) (Oral)  Resp 15  SpO2 98%  LMP 10/06/2014 Physical Exam  Constitutional: She appears well-developed and well-nourished. No distress.  HENT:  Head: Normocephalic and atraumatic.  No evidence of swelling or angioedema.   Eyes: Conjunctivae and EOM are normal. Pupils are equal, round,  and reactive to light.  Neck: Normal range of motion. Neck supple.  Cardiovascular: Normal rate, regular rhythm and normal heart sounds.   Pulmonary/Chest: [place] normal and breath sounds normal. No respiratory distress. She has no wheezes.  Abdominal: Soft. Bowel sounds are normal.  Musculoskeletal: Normal range of motion. She exhibits no edema or tenderness.  Neurological: She is alert.  Skin: Skin is warm and dry. She is  not diaphoretic.  No urticaria currently noted.   Psychiatric: She has a normal mood and affect. Her behavior is normal.  Nursing note and vitals reviewed.  ED Course  Procedures (including critical care time) DIAGNOSTIC STUDIES: Oxygen Saturation is 98% on RA, normal by my interpretation.    COORDINATION OF CARE: 2:37 PM-Discussed treatment plan which includes Benadryl with pt at bedside and pt agreed to plan.   Labs Review Labs Reviewed - No data to display  Imaging Review No results found. I have personally reviewed and evaluated these images and lab results as part of my medical decision-making.   EKG Interpretation None      MDM   Final diagnoses:  Urticaria, chronic    [name] presents with a resolved allergic reaction with unknown cause.  No signs of current allergic reaction. Previous reaction is concerning for a near-anaphylactic reaction. Decadron ordered. Pt advised to follow up with an allergist as soon as possible to establish long term care on this matter. Pt also prescribed an EpiPen with instructions to only use it for life threatening reactions. Pt was also told that she would need to come to the ED should she ever use her EpiPen.  I personally performed the services described in this documentation, which was scribed in my presence. The recorded information has been reviewed and is accurate.    [name], PA-C 11/08/14 [place], MD 11/08/14 [phone]

## 2014-11-08 NOTE — ED Notes (Signed)
Pt reports yesterday had hives and wheezing, today s/s are gone. Has hx of hives in summer but not during this time of year. When the hives occurred, they came in a batch to her chest. Pt denies any at current. Is a x 4. In NAD

## 2014-11-08 NOTE — ED Notes (Signed)
Declined W/C at D/C and was escorted to lobby by RN. 

## 2015-03-15 ENCOUNTER — Emergency Department (INDEPENDENT_AMBULATORY_CARE_PROVIDER_SITE_OTHER)
Admission: EM | Admit: 2015-03-15 | Discharge: 2015-03-15 | Disposition: A | Discharge status: Home / Self Care | Payer: Medicaid Other | Source: Home / Self Care | Attending: Family Medicine | Admitting: Family Medicine

## 2015-03-15 ENCOUNTER — Encounter (HOSPITAL_COMMUNITY): Payer: Self-pay | Admitting: Emergency Medicine

## 2015-03-15 DIAGNOSIS — J111 Influenza due to unidentified influenza virus with other respiratory manifestations: Secondary | ICD-10-CM | POA: Diagnosis not present

## 2015-03-15 DIAGNOSIS — R112 Nausea with vomiting, unspecified: Secondary | ICD-10-CM

## 2015-03-15 MED ORDER — ONDANSETRON 4 MG PO TBDP: 4.0000 mg | ORAL_TABLET | Freq: Three times a day (TID) | ORAL | Status: DC | PRN

## 2015-03-15 NOTE — Discharge Instructions (Signed)
You have the flu This will likely resolve in another 2-3 days Your cough may linger for an additional 2-3 weeks Please use tylenol and ibuprofen for pain and fever Please use the zofran for nausea Please use either yogurt or a probiotic to help fight the infection in your gut.  Please only use imodium if needed for severe diarrhea.

## 2015-03-15 NOTE — ED Provider Notes (Signed)
CSN: [id number]     Arrival date & time 03/15/15  1807 History   First MD Initiated Contact with Patient 03/15/15 1813     No chief complaint on file.  (Consider location/radiation/quality/duration/timing/severity/associated sxs/prior Treatment) HPI  4 days of body aches, runny nose, coughing. Vomiting and diarrhea x1 day. Vomiting is nonbilious and nonbloody. Symptoms constant w/ waxing and waning nature mucinex w/ little benefit.  Diarrhea 1 day. Nonbloody. Denies chest pain, rash, dysuria, frequency, abdominal pain, back pain, neck stiffness, chest pain, shortness of breath. Migraine started 2 days ago. Typical for pt when she gets these kinds of illnesses.   Past Medical History  Diagnosis Date  . Migraines    History reviewed. No pertinent past surgical history. Family History  Problem Relation Age of Onset  . Diabetes Mother   . Migraines Father   . Cancer Other    Social History  Substance Use Topics  . Smoking status: Current Every Day Smoker  . Smokeless tobacco: Never Used  . Alcohol Use: Yes     Comment: rarely    OB History    Gravida Para Term Preterm AB TAB SAB Ectopic Multiple Living   3 1 1  0 0 0 0 0 0 1     Review of Systems Per HPI with all other pertinent systems negative.   Allergies  Review of patient's allergies indicates no known allergies.  Home Medications   Prior to Admission medications   Medication Sig Start Date End Date Taking? Authorizing Provider  ibuprofen (ADVIL,MOTRIN) 800 MG tablet Take 1 tablet (800 mg total) by mouth every 8 (eight) hours as needed for mild pain or moderate pain. 06/04/14   Clayton Bibles, PA-C  ondansetron (ZOFRAN-ODT) 4 MG disintegrating tablet Take 1 tablet (4 mg total) by mouth every 8 (eight) hours as needed for nausea or vomiting. 03/15/15   Waldemar Dickens, MD   Meds Ordered and Administered this Visit  Medications - No data to display  BP 139/79 mmHg  Pulse 98  Temp(Src) 101.1 F (38.4 C) (Oral)  Resp  16  SpO2 99%  LMP 03/13/2015 No data found.   Physical Exam Physical Exam  Constitutional: General ill appearing. Well-nourished well-developed HENT:  Head: Normocephalic and atraumatic.  Eyes: EOMI. PERRL.  Neck: Normal range of motion.  Cardiovascular: RRR, no m/r/g, 2+ distal pulses,  Pulmonary/Chest: Effort normal and breath sounds normal. No respiratory distress.  Abdominal: Soft. Bowel sounds are normal. NonTTP, no distension.  Musculoskeletal: Normal range of motion. Non ttp, no effusion.  Neurological: alert and oriented to person, place, and time.  Skin: Skin is warm. No rash noted. non diaphoretic.  Psychiatric: normal mood and affect. behavior is normal. Judgment and thought content normal.    ED Course  Procedures (including critical care time)  Labs Review Labs Reviewed - No data to display  Imaging Review No results found.   Visual Acuity Review  Right Eye Distance:   Left Eye Distance:   Bilateral Distance:    Right Eye Near:   Left Eye Near:    Bilateral Near:         MDM   1. Flu syndrome   2. Nausea and vomiting, vomiting of unspecified type    Zofran for nausea and vomiting. Probiotic or yogurt for diarrhea. Only use Imodium for severe diarrhea. No Tamiflu for flu she's outside the window for treatment. Suspect this will resolve over the next several days. Tylenol Motrin fluids rest. Work note provided.  I  have verbally reviewed the discharge instructions with the patient. A printed AVS was given to the patient.  All questions were answered prior to discharge.      Waldemar Dickens, MD 03/15/15 [id number]

## 2015-03-15 NOTE — ED Notes (Signed)
Onset 3 days ago, cough, chest soreness vomiting, diarrhea, headache, body aches, chest is "on fire" with coughing.  Reports temperature of 101.4

## 2015-03-17 ENCOUNTER — Encounter (HOSPITAL_COMMUNITY): Payer: Self-pay | Admitting: *Deleted

## 2015-03-17 ENCOUNTER — Emergency Department (HOSPITAL_COMMUNITY)
Admission: EM | Admit: 2015-03-17 | Discharge: 2015-03-18 | Disposition: A | Discharge status: Home / Self Care | Payer: Medicaid Other | Attending: Emergency Medicine | Admitting: Emergency Medicine

## 2015-03-17 DIAGNOSIS — F17200 Nicotine dependence, unspecified, uncomplicated: Secondary | ICD-10-CM | POA: Diagnosis not present

## 2015-03-17 DIAGNOSIS — Z8679 Personal history of other diseases of the circulatory system: Secondary | ICD-10-CM | POA: Insufficient documentation

## 2015-03-17 DIAGNOSIS — R509 Fever, unspecified: Secondary | ICD-10-CM | POA: Diagnosis present

## 2015-03-17 DIAGNOSIS — R519 Headache, unspecified: Secondary | ICD-10-CM

## 2015-03-17 DIAGNOSIS — B349 Viral infection, unspecified: Secondary | ICD-10-CM | POA: Diagnosis not present

## 2015-03-17 DIAGNOSIS — R51 Headache: Secondary | ICD-10-CM

## 2015-03-17 MED ORDER — DIPHENHYDRAMINE HCL 50 MG/ML IJ SOLN
25.0000 mg | Freq: Once | INTRAMUSCULAR | Status: AC
  Administered 2015-03-17: 25 mg via INTRAVENOUS
  Filled 2015-03-17: qty 1

## 2015-03-17 MED ORDER — ALBUTEROL SULFATE HFA 108 (90 BASE) MCG/ACT IN AERS
2.0000 | INHALATION_SPRAY | Freq: Once | RESPIRATORY_TRACT | Status: AC
  Administered 2015-03-17: 2 via RESPIRATORY_TRACT
  Filled 2015-03-17: qty 6.7

## 2015-03-17 MED ORDER — KETOROLAC TROMETHAMINE 30 MG/ML IJ SOLN
30.0000 mg | Freq: Once | INTRAMUSCULAR | Status: AC
  Administered 2015-03-17: 30 mg via INTRAVENOUS
  Filled 2015-03-17: qty 1

## 2015-03-17 MED ORDER — PROCHLORPERAZINE EDISYLATE 5 MG/ML IJ SOLN
10.0000 mg | Freq: Once | INTRAMUSCULAR | Status: AC
  Administered 2015-03-17: 10 mg via INTRAVENOUS
  Filled 2015-03-17: qty 2

## 2015-03-17 MED ORDER — SODIUM CHLORIDE 0.9 % IV BOLUS (SEPSIS)
1000.0000 mL | Freq: Once | INTRAVENOUS | Status: AC
  Administered 2015-03-17: 1000 mL via INTRAVENOUS

## 2015-03-17 NOTE — ED Notes (Signed)
Pt states she has had a fever, nausea, and headaches since last week. She was seen at Urgent care and dx with the flu on Thursday, she says she is not getting any better. States she has had a migraine headache all day. Last took ibuprofen this morning and tylenol around 1pm.

## 2015-03-17 NOTE — Discharge Instructions (Signed)
Read the information below.  You may return to the Emergency Department at any time for worsening condition or any new symptoms that concern you.  If you develop high fevers that do not resolve with tylenol or ibuprofen, you have difficulty swallowing or breathing, or you are unable to tolerate fluids by mouth, return to the ER for a recheck.     Viral Infections A viral infection can be caused by different types of viruses.Most viral infections are not serious and resolve on their own. However, some infections may cause severe symptoms and may lead to further complications. SYMPTOMS Viruses can frequently cause:  Minor sore throat.  Aches and pains.  Headaches.  Runny nose.  Different types of rashes.  Watery eyes.  Tiredness.  Cough.  Loss of appetite.  Gastrointestinal infections, resulting in nausea, vomiting, and diarrhea. These symptoms do not respond to antibiotics because the infection is not caused by bacteria. However, you might catch a bacterial infection following the viral infection. This is sometimes called a "superinfection." Symptoms of such a bacterial infection may include:  Worsening sore throat with pus and difficulty swallowing.  Swollen neck glands.  Chills and a high or persistent fever.  Severe headache.  Tenderness over the sinuses.  Persistent overall ill feeling (malaise), muscle aches, and tiredness (fatigue).  Persistent cough.  Yellow, green, or brown mucus production with coughing. HOME CARE INSTRUCTIONS   Only take over-the-counter or prescription medicines for pain, discomfort, diarrhea, or fever as directed by your caregiver.  Drink enough water and fluids to keep your urine clear or pale yellow. Sports drinks can provide valuable electrolytes, sugars, and hydration.  Get plenty of rest and maintain proper nutrition. Soups and broths with crackers or rice are fine. SEEK IMMEDIATE MEDICAL CARE IF:   You have severe headaches,  shortness of breath, chest pain, neck pain, or an unusual rash.  You have uncontrolled vomiting, diarrhea, or you are unable to keep down fluids.  You or your child has an oral temperature above 102 F (38.9 C), not controlled by medicine.  Your baby is older than 3 months with a rectal temperature of 102 F (38.9 C) or higher.  Your baby is 70 months old or younger with a rectal temperature of 100.4 F (38 C) or higher. MAKE SURE YOU:   Understand these instructions.  Will watch your condition.  Will get help right away if you are not doing well or get worse.   This information is not intended to replace advice given to you by your health care provider. Make sure you discuss any questions you have with your health care provider.   Document Released: 10/16/2004 Document Revised: 03/31/2011 Document Reviewed: 06/14/2014 Elsevier Interactive Patient Education [employer].

## 2015-03-17 NOTE — ED Provider Notes (Signed)
CSN: [id number]     Arrival date & time 03/17/15  2041 History   First MD Initiated Contact with Patient 03/17/15 2103     Chief Complaint  Patient presents with  . Fever     (Consider location/radiation/quality/duration/timing/severity/associated sxs/prior Treatment) HPI   Patient with hx migraine p/w 6 days of fever, myalgias, nasal congestion, sore throat, productive cough, N/V/D.  Was seen at urgent care 2 days ago and diagnosed with flu syndrome, d/c with zofran which patient has not filled.  Last vomited yesterday.  Had loose stool x 2 days.  Nonbloody.  Denies CP, SOB.  Has noticed wheezing.   Main complaint today is that this illness has caused a headache - has throbbing pain and tightness in her occiput through the top of her head.  Denies neck stiffness, light or sound sensitivity.   Companion is also coughing in the room, newly sick.    Past Medical History  Diagnosis Date  . Migraines    History reviewed. No pertinent past surgical history. Family History  Problem Relation Age of Onset  . Diabetes Mother   . Migraines Father   . Cancer Other    Social History  Substance Use Topics  . Smoking status: Current Every Day Smoker  . Smokeless tobacco: Never Used  . Alcohol Use: Yes     Comment: rarely    OB History    Gravida Para Term Preterm AB TAB SAB Ectopic Multiple Living   3 1 1  0 0 0 0 0 0 1     Review of Systems  All other systems reviewed and are negative.     Allergies  Review of patient's allergies indicates no known allergies.  Home Medications   Prior to Admission medications   Medication Sig Start Date End Date Taking? Authorizing Provider  acetaminophen (TYLENOL) 325 MG tablet Take 650 mg by mouth every 6 (six) hours as needed for moderate pain.   Yes Historical Provider, MD  dextromethorphan (DELSYM) 30 MG/5ML liquid Take 60 mg by mouth as needed for cough.   Yes Historical Provider, MD  ibuprofen (ADVIL,MOTRIN) 200 MG tablet Take 400 mg by  mouth every 6 (six) hours as needed for moderate pain.   Yes Historical Provider, MD  Phenylephrine-APAP-Guaifenesin (MUCINEX FAST-MAX) 10-650-400 MG/20ML LIQD Take 30 mLs by mouth daily as needed (for cold).   Yes Historical Provider, MD  ibuprofen (ADVIL,MOTRIN) 800 MG tablet Take 1 tablet (800 mg total) by mouth every 8 (eight) hours as needed for mild pain or moderate pain. 06/04/14   Clayton Bibles, PA-C   BP 137/78 mmHg  Pulse 87  Temp(Src) 99.5 F (37.5 C) (Oral)  Resp 18  SpO2 99%  LMP 03/13/2015 Physical Exam  Constitutional: She appears well-developed and well-nourished. No distress.  HENT:  Head: Normocephalic and atraumatic.  Mouth/Throat: Uvula is midline. Mucous membranes are not dry. No uvula swelling. Posterior oropharyngeal edema and posterior oropharyngeal erythema present. No oropharyngeal exudate or tonsillar abscesses.  Eyes: Conjunctivae are normal.  Neck: Normal range of motion. Neck supple.  Cardiovascular: Normal rate and regular rhythm.   Pulmonary/Chest: Effort normal and breath sounds normal. No respiratory distress. She has no wheezes. She has no rales.  Abdominal: Soft. She exhibits no distension and no mass. There is no tenderness. There is no rebound and no guarding.  Neurological: She is alert. She exhibits normal muscle tone.  CN II-XII intact, EOMs intact, no pronator drift, grip strengths equal bilaterally; strength 5/5 in all extremities, sensation  intact in all extremities; finger to nose, heel to shin, rapid alternating movements normal; gait is normal.     Skin: She is not diaphoretic.  Psychiatric: She has a normal mood and affect. Her behavior is normal.  Nursing note and vitals reviewed.   ED Course  Procedures (including critical care time) Labs Review Labs Reviewed - No data to display  Imaging Review No results found. I have personally reviewed and evaluated these images and lab results as part of my medical decision-making.   EKG  Interpretation None       11:05 PM Pt reports she is feeling much better.  NO other needs at this time.  States she does not need other medications for home at this time.    MDM   Final diagnoses:  Viral syndrome  Acute nonintractable headache, unspecified headache type    Afebrile, nontoxic patient with constellation of symptoms suggestive of viral syndrome.  No concerning findings on exam. Complaining today of headache.  No nuchal rigidity, no neurologic deficits, no red flags.  Likely headache from ongoing illness, though overall illness seems to be improving.  Discharged home with supportive care, PCP follow up.  Discussed result, findings, treatment, and follow up  with patient.  Pt given return precautions.  Pt verbalizes understanding and agrees with plan.        Clayton Bibles, PA-C 03/17/15 [place], DO 03/17/15 2338

## 2015-09-18 ENCOUNTER — Encounter (HOSPITAL_COMMUNITY): Payer: Self-pay | Admitting: Emergency Medicine

## 2015-09-18 ENCOUNTER — Ambulatory Visit (HOSPITAL_COMMUNITY)
Admission: EM | Admit: 2015-09-18 | Discharge: 2015-09-18 | Disposition: A | Discharge status: Home / Self Care | Payer: Medicaid Other | Attending: Family Medicine | Admitting: Family Medicine

## 2015-09-18 DIAGNOSIS — F17200 Nicotine dependence, unspecified, uncomplicated: Secondary | ICD-10-CM | POA: Insufficient documentation

## 2015-09-18 DIAGNOSIS — Z833 Family history of diabetes mellitus: Secondary | ICD-10-CM | POA: Insufficient documentation

## 2015-09-18 DIAGNOSIS — J029 Acute pharyngitis, unspecified: Secondary | ICD-10-CM | POA: Insufficient documentation

## 2015-09-18 LAB — POCT RAPID STREP A: Streptococcus, Group A Screen (Direct): NEGATIVE

## 2015-09-18 MED ORDER — LIDOCAINE VISCOUS 2 % MT SOLN: 20.0000 mL | OROMUCOSAL | 0 refills | Status: DC | PRN

## 2015-09-18 MED ORDER — AZITHROMYCIN 250 MG PO TABS: 250.0000 mg | ORAL_TABLET | Freq: Every day | ORAL | 0 refills | Status: DC

## 2015-09-18 NOTE — ED Provider Notes (Signed)
CSN: [id number]     Arrival date & time 09/18/15  1455 History   None    Chief Complaint  Patient presents with  . Sore Throat   (Consider location/radiation/quality/duration/timing/severity/associated sxs/prior Treatment) Patient c/o sore throat for 3 days.   The history is provided by the patient.  Sore Throat  This is a new problem. The current episode started more than 2 days ago. The problem occurs constantly. The problem has not changed since onset.Nothing aggravates the symptoms. Nothing relieves the symptoms. She has tried nothing for the symptoms.    Past Medical History:  Diagnosis Date  . Migraines    History reviewed. No pertinent surgical history. Family History  Problem Relation Age of Onset  . Diabetes Mother   . Migraines Father   . Cancer Other    Social History  Substance Use Topics  . Smoking status: Current Every Day Smoker  . Smokeless tobacco: Never Used  . Alcohol use Yes     Comment: rarely    OB History    Gravida Para Term Preterm AB Living   3 1 1  0 0 1   SAB TAB Ectopic Multiple Live Births   0 0 0 0 1     Review of Systems  Constitutional: Positive for fatigue.  HENT: Positive for sore throat.   Eyes: Negative.   Respiratory: Negative.   Cardiovascular: Negative.   Gastrointestinal: Negative.   Endocrine: Negative.   Genitourinary: Negative.   Musculoskeletal: Negative.   Skin: Negative.   Allergic/Immunologic: Negative.   Neurological: Negative.   Hematological: Negative.   Psychiatric/Behavioral: Negative.     Allergies  Review of patient's allergies indicates no known allergies.  Home Medications   Prior to Admission medications   Medication Sig Start Date End Date Taking? Authorizing Provider  acetaminophen (TYLENOL) 325 MG tablet Take 650 mg by mouth every 6 (six) hours as needed for moderate pain.    Historical Provider, MD  dextromethorphan (DELSYM) 30 MG/5ML liquid Take 60 mg by mouth as needed for cough.     Historical Provider, MD  ibuprofen (ADVIL,MOTRIN) 200 MG tablet Take 400 mg by mouth every 6 (six) hours as needed for moderate pain.    Historical Provider, MD  ibuprofen (ADVIL,MOTRIN) 800 MG tablet Take 1 tablet (800 mg total) by mouth every 8 (eight) hours as needed for mild pain or moderate pain. 06/04/14   Clayton Bibles, PA-C  Phenylephrine-APAP-Guaifenesin (MUCINEX FAST-MAX) [phone] MG/20ML LIQD Take 30 mLs by mouth daily as needed (for cold).    Historical Provider, MD   Meds Ordered and Administered this Visit  Medications - No data to display  BP 136/82 (BP Location: Left Arm)   Pulse 79   Temp 98.8 F (37.1 C) (Oral)   Resp 18   LMP 09/11/2015 (Exact Date)   SpO2 100%  No data found.   Physical Exam  Constitutional: She appears well-developed and well-nourished.  HENT:  Head: Normocephalic and atraumatic.  Right Ear: External ear normal.  Left Ear: External ear normal.  OPX injected tonsils 2 plus w/o exudates.  Eyes: Conjunctivae and EOM are normal. Pupils are equal, round, and reactive to light.  Neck: Normal range of motion. Neck supple.  Cardiovascular: Normal rate, regular rhythm and normal heart sounds.   Pulmonary/Chest: Effort normal and breath sounds normal.  Abdominal: Soft. Bowel sounds are normal.  Nursing note and vitals reviewed.   Urgent Care Course   Clinical Course    Procedures (including critical care time)  Labs Review Labs Reviewed - No data to display  Imaging Review No results found.   Visual Acuity Review  Right Eye Distance:   Left Eye Distance:   Bilateral Distance:    Right Eye Near:   Left Eye Near:    Bilateral Near:         MDM  Pharyngitis - Zpak as directed Lidocaine 2% solution 20 ml po tid prn #120ml Push po fluids, rest, tylenol and motrin otc prn as directed for fever, arthralgias, and myalgias.  Follow up prn if sx's continue or persist.   Lysbeth Penner, FNP 09/18/15 1550

## 2015-09-18 NOTE — ED Triage Notes (Signed)
The patient presented to the [facility] with a complaint of a sore throat, cough, chills and general body aches x 3 days.

## 2015-09-19 ENCOUNTER — Emergency Department (HOSPITAL_COMMUNITY)
Admission: EM | Admit: 2015-09-19 | Discharge: 2015-09-19 | Disposition: A | Discharge status: Home / Self Care | Payer: Medicaid Other | Attending: Emergency Medicine | Admitting: Emergency Medicine

## 2015-09-19 ENCOUNTER — Emergency Department (HOSPITAL_COMMUNITY): Payer: Medicaid Other

## 2015-09-19 ENCOUNTER — Encounter (HOSPITAL_COMMUNITY): Payer: Self-pay

## 2015-09-19 DIAGNOSIS — J189 Pneumonia, unspecified organism: Secondary | ICD-10-CM | POA: Insufficient documentation

## 2015-09-19 DIAGNOSIS — F17200 Nicotine dependence, unspecified, uncomplicated: Secondary | ICD-10-CM | POA: Insufficient documentation

## 2015-09-19 DIAGNOSIS — R042 Hemoptysis: Secondary | ICD-10-CM | POA: Insufficient documentation

## 2015-09-19 DIAGNOSIS — Z720 Tobacco use: Secondary | ICD-10-CM

## 2015-09-19 LAB — URINALYSIS, ROUTINE W REFLEX MICROSCOPIC
Bilirubin Urine: NEGATIVE
Glucose, UA: NEGATIVE mg/dL
Hgb urine dipstick: NEGATIVE
Ketones, ur: NEGATIVE mg/dL
Leukocytes, UA: NEGATIVE
Nitrite: NEGATIVE
Protein, ur: NEGATIVE mg/dL
Specific Gravity, Urine: 1.013 (ref 1.005–1.030)
pH: 7.5 (ref 5.0–8.0)

## 2015-09-19 LAB — BASIC METABOLIC PANEL WITH GFR
Anion gap: 7 (ref 5–15)
BUN: <5 mg/dL — ABNORMAL LOW (ref 6–20)
CO2: 25 mmol/L (ref 22–32)
Calcium: 9.5 mg/dL (ref 8.9–10.3)
Chloride: 105 mmol/L (ref 101–111)
Creatinine, Ser: 0.54 mg/dL (ref 0.44–1.00)
GFR calc Af Amer: >60 mL/min
GFR calc non Af Amer: >60 mL/min
Glucose, Bld: 115 mg/dL — ABNORMAL HIGH (ref 65–99)
Potassium: 3.8 mmol/L (ref 3.5–5.1)
Sodium: 137 mmol/L (ref 135–145)

## 2015-09-19 LAB — CBC WITH DIFFERENTIAL/PLATELET
Basophils Absolute: 0 K/uL (ref 0.0–0.1)
Basophils Relative: 0 %
Eosinophils Absolute: 0.1 K/uL (ref 0.0–0.7)
Eosinophils Relative: 1 %
HCT: 37 % (ref 36.0–46.0)
Hemoglobin: 12.7 g/dL (ref 12.0–15.0)
Lymphocytes Relative: 10 %
Lymphs Abs: 1 K/uL (ref 0.7–4.0)
MCH: 26.7 pg (ref 26.0–34.0)
MCHC: 34.3 g/dL (ref 30.0–36.0)
MCV: 77.9 fL — ABNORMAL LOW (ref 78.0–100.0)
Monocytes Absolute: 0.5 K/uL (ref 0.1–1.0)
Monocytes Relative: 5 %
Neutro Abs: 8 K/uL — ABNORMAL HIGH (ref 1.7–7.7)
Neutrophils Relative %: 84 %
Platelets: 255 K/uL (ref 150–400)
RBC: 4.75 MIL/uL (ref 3.87–5.11)
RDW: 12.5 % (ref 11.5–15.5)
WBC: 9.6 K/uL (ref 4.0–10.5)

## 2015-09-19 LAB — PREGNANCY, URINE: PREG TEST UR: NEGATIVE

## 2015-09-19 LAB — D-DIMER, QUANTITATIVE (NOT AT ARMC): D DIMER QUANT: 0.69 ug{FEU}/mL — AB (ref 0.00–0.50)

## 2015-09-19 MED ORDER — AZITHROMYCIN 250 MG PO TABS: 500.0000 mg | ORAL_TABLET | Freq: Once | ORAL | Status: DC

## 2015-09-19 MED ORDER — PREDNISONE 10 MG (21) PO TBPK: 10.0000 mg | ORAL_TABLET | Freq: Every day | ORAL | 0 refills | Status: DC

## 2015-09-19 MED ORDER — ALBUTEROL SULFATE HFA 108 (90 BASE) MCG/ACT IN AERS
2.0000 | INHALATION_SPRAY | Freq: Once | RESPIRATORY_TRACT | Status: AC
  Administered 2015-09-19: 2 via RESPIRATORY_TRACT
  Filled 2015-09-19: qty 6.7

## 2015-09-19 MED ORDER — IOPAMIDOL (ISOVUE-370) INJECTION 76%
INTRAVENOUS | Status: AC
  Administered 2015-09-19: 65 mL via INTRAVENOUS
  Filled 2015-09-19: qty 100

## 2015-09-19 MED ORDER — AEROCHAMBER PLUS W/MASK MISC
1.0000 | Freq: Once | Status: AC
  Administered 2015-09-19: 1
  Filled 2015-09-19: qty 1

## 2015-09-19 MED FILL — predniSONE 10 MG TABS: 10 | 12 days supply | Qty: 42 | Fill #0

## 2015-09-19 MED FILL — AZITHROMYCIN 250 MG TABLET: 250 | 5 days supply | Qty: 6 | Fill #0

## 2015-09-19 NOTE — ED Notes (Signed)
Waiting for respiratory with aero chamber prior to patient leaving, respiratory called and made aware

## 2015-09-19 NOTE — Discharge Instructions (Signed)
Try to stop smoking.  Continue zithromax.

## 2015-09-19 NOTE — ED Provider Notes (Signed)
[facility] DEPT Provider Note   CSN: [id number] Arrival date & time: 09/19/15  0809     History   Chief Complaint Chief Complaint  Patient presents with  . Hemoptysis    HPI Misty Murillo is a 25 y.o. female.  Pt has had a cough and sob since yesterday.  She was seen at urgent care and was dx'd with bronchitis.  She was put on zithromax.  The pt said that she started coughing up blood today.  She does have a hx of tob abuse as well.      Past Medical History:  Diagnosis Date  . Migraines     There are no active problems to display for this patient.   History reviewed. No pertinent surgical history.  OB History    Gravida Para Term Preterm AB Living   3 1 1  0 0 1   SAB TAB Ectopic Multiple Live Births   0 0 0 0 1       Home Medications    Prior to Admission medications   Medication Sig Start Date End Date Taking? Authorizing Provider  Phenylephrine-Pheniramine-DM [facility] COLD & COUGH) 11-09-18 MG PACK Take 1 packet by mouth every 8 (eight) hours as needed (cold symptoms).   Yes Historical Provider, MD  azithromycin (ZITHROMAX) 250 MG tablet Take 1 tablet (250 mg total) by mouth daily. Take first 2 tablets together, then 1 every day until finished. 09/18/15   Lysbeth Penner, FNP  ibuprofen (ADVIL,MOTRIN) 800 MG tablet Take 1 tablet (800 mg total) by mouth every 8 (eight) hours as needed for mild pain or moderate pain. Patient not taking: Reported on 09/19/2015 06/04/14   Clayton Bibles, PA-C  lidocaine (XYLOCAINE) 2 % solution Use as directed 20 mLs in the mouth or throat as needed for mouth pain. Patient not taking: Reported on 09/19/2015 09/18/15   Lysbeth Penner, FNP  predniSONE (STERAPRED UNI-PAK 21 TAB) 10 MG (21) TBPK tablet Take 1 tablet (10 mg total) by mouth daily. Take 6 tabs by mouth daily  for 2 days, then 5 tabs for 2 days, then 4 tabs for 2 days, then 3 tabs for 2 days, 2 tabs for 2 days, then 1 tab by mouth daily for 2 days 09/19/15   Isla Pence,  MD    Family History Family History  Problem Relation Age of Onset  . Diabetes Mother   . Migraines Father   . Cancer Other     Social History Social History  Substance Use Topics  . Smoking status: Current Every Day Smoker  . Smokeless tobacco: Never Used  . Alcohol use No     Comment: rarely      Allergies   Review of patient's allergies indicates no known allergies.   Review of Systems Review of Systems  Respiratory: Positive for shortness of breath and wheezing.        Hemoptysis  All other systems reviewed and are negative.    Physical Exam Updated Vital Signs BP 137/74   Pulse 100   Temp 98.6 F (37 C) (Oral)   Resp (!) 32   Ht 5\' 4"  (1.626 m)   Wt 205 lb (93 kg)   LMP 09/11/2015 (Exact Date)   SpO2 100%   BMI 35.19 kg/m   Physical Exam  Constitutional: She is oriented to person, place, and time. She appears well-developed and well-nourished.  HENT:  Head: Normocephalic and atraumatic.  Right Ear: External ear normal.  Left Ear: External ear  normal.  Nose: Nose normal.  Mouth/Throat: Oropharynx is clear and moist.  Eyes: Conjunctivae and EOM are normal. Pupils are equal, round, and reactive to light.  Neck: Normal range of motion. Neck supple.  Cardiovascular: Normal rate, regular rhythm, normal heart sounds and intact distal pulses.   Pulmonary/Chest: Effort normal. She has wheezes.  Abdominal: Soft. Bowel sounds are normal.  Musculoskeletal: Normal range of motion.  Neurological: She is alert and oriented to person, place, and time.  Skin: Skin is warm and dry.  Psychiatric: She has a normal mood and affect. Her behavior is normal. Judgment and thought content normal.  Nursing note and vitals reviewed.    ED Treatments / Results  Labs (all labs ordered are listed, but only abnormal results are displayed) Labs Reviewed  BASIC METABOLIC PANEL - Abnormal; Notable for the following:       Result Value   Glucose, Bld 115 (*)    BUN <5 (*)     All other components within normal limits  CBC WITH DIFFERENTIAL/PLATELET - Abnormal; Notable for the following:    MCV 77.9 (*)    Neutro Abs 8.0 (*)    All other components within normal limits  D-DIMER, QUANTITATIVE (NOT AT [facility]) - Abnormal; Notable for the following:    D-Dimer, Quant 0.69 (*)    All other components within normal limits  PREGNANCY, URINE  URINALYSIS, ROUTINE W REFLEX MICROSCOPIC (NOT AT [facility])    EKG  EKG Interpretation None       Radiology Dg Chest 2 View  Result Date: 09/19/2015 CLINICAL DATA:  Shortness of breath, cough, current smoker. EXAM: CHEST  2 VIEW COMPARISON:  PA and lateral chest x-ray of Jun 07, 2014 FINDINGS: The lungs are adequately inflated. The interstitial markings are mildly increased but stable. The heart and pulmonary vascularity are normal. The mediastinum is normal in width. There is no pleural effusion. The bony thorax exhibits no acute abnormality. IMPRESSION: Mild interstitial prominence may reflect bronchitic change and/or the patient's smoking history. There is no alveolar pneumonia nor other acute cardiopulmonary abnormality. Electronically Signed   By: David  Martinique M.D.   On: 09/19/2015 08:48   Ct Angio Chest Pe W And/or Wo Contrast  Result Date: 09/19/2015 CLINICAL DATA:  25 year old female with chest pain, hemoptysis, abnormal D-dimer. Onset of cold symptoms 5 days ago. Initial encounter. EXAM: CT ANGIOGRAPHY CHEST WITH CONTRAST TECHNIQUE: Multidetector CT imaging of the chest was performed using the standard protocol during bolus administration of intravenous contrast. Multiplanar CT image reconstructions and MIPs were obtained to evaluate the vascular anatomy. CONTRAST:  65 mL Isovue 370 COMPARISON:  Chest radiographs 0833 hours today and earlier. FINDINGS: Suboptimal contrast bolus timing in the pulmonary arterial tree. No central or hilar pulmonary artery filling defect. In part related to mild respiratory motion the lower lobe  pulmonary artery detail is suboptimal. No upper lobe pulmonary artery filling defect identified. Major airways are patent and appear normal. There is minimal patchy opacity at the anterior basal segment of the left lower lobe. The left lung otherwise is clear. The right lung is clear. No pleural effusion. No pericardial effusion. Residual thymus in the anterior mediastinum. No mediastinal or hilar lymphadenopathy. No axillary lymphadenopathy. Negative visualized aorta. Generalized mild to moderate thyromegaly. No discrete thyroid lesion identified. Negative visualized liver, spleen, adrenal glands, renal upper poles, and stomach in the upper abdomen. No acute osseous abnormality identified. Review of the MIP images confirms the above findings. IMPRESSION: 1. Suboptimal contrast bolus timing  but no strong evidence of acute pulmonary embolus. 2. Mild patchy opacity along the diaphragm in the anterior basal segment of the left lower lobe is compatible with a small bronchopneumonia in this setting. No pleural effusion. 3. Mild to moderate generalized thyromegaly. Consider thyroid hormone level testing. Electronically Signed   By: Genevie Ann M.D.   On: 09/19/2015 11:32    Procedures Procedures (including critical care time)  Medications Ordered in ED Medications  albuterol (PROVENTIL HFA;VENTOLIN HFA) 108 (90 Base) MCG/ACT inhaler 2 puff (not administered)  aerochamber plus with mask device 1 each (not administered)  iopamidol (ISOVUE-370) 76 % injection (65 mLs Intravenous Contrast Given 09/19/15 1056)     Initial Impression / Assessment and Plan / ED Course  I have reviewed the triage vital signs and the nursing notes.  Pertinent labs & imaging results that were available during my care of the patient were reviewed by me and considered in my medical decision making (see chart for details).  Clinical Course   Pt is feeling much better.  She is given an albuterol inhaler.  She is also given prednisone  here and a rx for a prednisone taper.  She is told to stop smoking and to continue her zithromax.  She knows to return if worse.  Final Clinical Impressions(s) / ED Diagnoses   Final diagnoses:  Hemoptysis  Community acquired pneumonia  Tobacco abuse    New Prescriptions New Prescriptions   PREDNISONE (STERAPRED UNI-PAK 21 TAB) 10 MG (21) TBPK TABLET    Take 1 tablet (10 mg total) by mouth daily. Take 6 tabs by mouth daily  for 2 days, then 5 tabs for 2 days, then 4 tabs for 2 days, then 3 tabs for 2 days, 2 tabs for 2 days, then 1 tab by mouth daily for 2 days     Isla Pence, MD 09/19/15 1219

## 2015-09-19 NOTE — ED Notes (Signed)
Misty Murillo; Transporter, transporting pt to x-ray.  

## 2015-09-19 NOTE — ED Triage Notes (Signed)
Per Pt, Pt was seen at [facility] yesterday for flu like symptoms. Pt reports being negative for flu. Today pt started coughing up blood.

## 2015-09-19 NOTE — ED Notes (Signed)
EDP at bedside  

## 2015-09-20 LAB — CULTURE, GROUP A STREP (THRC)

## 2015-12-07 ENCOUNTER — Ambulatory Visit (HOSPITAL_COMMUNITY)
Admission: EM | Admit: 2015-12-07 | Discharge: 2015-12-07 | Disposition: A | Discharge status: Home / Self Care | Payer: Medicaid Other | Attending: Family Medicine | Admitting: Family Medicine

## 2015-12-07 ENCOUNTER — Encounter (HOSPITAL_COMMUNITY): Payer: Self-pay

## 2015-12-07 DIAGNOSIS — A084 Viral intestinal infection, unspecified: Secondary | ICD-10-CM

## 2015-12-07 DIAGNOSIS — A09 Infectious gastroenteritis and colitis, unspecified: Secondary | ICD-10-CM

## 2015-12-07 DIAGNOSIS — R197 Diarrhea, unspecified: Secondary | ICD-10-CM

## 2015-12-07 MED ORDER — ONDANSETRON 8 MG PO TBDP: 8.0000 mg | ORAL_TABLET | Freq: Three times a day (TID) | ORAL | 0 refills | Status: DC | PRN

## 2015-12-07 NOTE — ED Triage Notes (Signed)
Started this morning around 6:30 and having nausea and diarrhea but no vomiting. Son also has symptoms. No fever and no OTC meds taken.

## 2015-12-07 NOTE — ED Provider Notes (Signed)
[facility]    CSN: [id number] Arrival date & time: 12/07/15  1054     History   Chief Complaint Chief Complaint  Patient presents with  . Nausea    HPI Misty Murillo is a 25 y.o. female.   Is a 25 year old woman who presents with nausea and diarrhea. The symptoms began early this morning. Her son has similar symptoms. She's had no vomiting or fever. She's had a few abdominal cramps. There is been no blood in stool.  Patient is in a gay relationship. She works in a [employer].      Past Medical History:  Diagnosis Date  . Migraines     There are no active problems to display for this patient.   History reviewed. No pertinent surgical history.  OB History    Gravida Para Term Preterm AB Living   3 1 1  0 0 1   SAB TAB Ectopic Multiple Live Births   0 0 0 0 1       Home Medications    Prior to Admission medications   Medication Sig Start Date End Date Taking? Authorizing Provider  azithromycin (ZITHROMAX) 250 MG tablet Take 1 tablet (250 mg total) by mouth daily. Take first 2 tablets together, then 1 every day until finished. 09/18/15   Lysbeth Penner, FNP  ondansetron (ZOFRAN-ODT) 8 MG disintegrating tablet Take 1 tablet (8 mg total) by mouth every 8 (eight) hours as needed for nausea. 12/07/15   Robyn Haber, MD  Phenylephrine-Pheniramine-DM ([facility]) 11-09-18 MG PACK Take 1 packet by mouth every 8 (eight) hours as needed (cold symptoms).    Historical Provider, MD    Family History Family History  Problem Relation Age of Onset  . Diabetes Mother   . Migraines Father   . Cancer Other     Social History Social History  Substance Use Topics  . Smoking status: Current Every Day Smoker  . Smokeless tobacco: Never Used  . Alcohol use No     Comment: rarely      Allergies   Patient has no known allergies.   Review of Systems Review of Systems  Constitutional: Negative.   HENT: Negative.   Eyes: Negative.    Respiratory: Negative.   Cardiovascular: Negative.   Gastrointestinal: Positive for diarrhea and nausea. Negative for vomiting.  Musculoskeletal: Negative.   Neurological: Negative.      Physical Exam Triage Vital Signs ED Triage Vitals  Enc Vitals Group     BP 12/07/15 1107 123/72     Pulse Rate 12/07/15 1107 90     Resp 12/07/15 1107 16     Temp 12/07/15 1107 98.2 F (36.8 C)     Temp Source 12/07/15 1107 Oral     SpO2 12/07/15 1107 100 %     Weight 12/07/15 1109 200 lb (90.7 kg)     Height 12/07/15 1109 5\' 4"  (1.626 m)     Head Circumference --      Peak Flow --      Pain Score 12/07/15 1110 0     Pain Loc --      Pain Edu? --      Excl. in [place]? --    No data found.   Updated Vital Signs BP 123/72 (BP Location: Left Arm)   Pulse 90   Temp 98.2 F (36.8 C) (Oral)   Resp 16   Ht 5\' 4"  (1.626 m)   Wt 200 lb (90.7 kg)  LMP 12/07/2015 (Exact Date)   SpO2 100%   Breastfeeding? No   BMI 34.33 kg/m   Physical Exam  Constitutional: She appears well-developed and well-nourished.  HENT:  Head: Normocephalic.  Right Ear: External ear normal.  Left Ear: External ear normal.  Mouth/Throat: Oropharynx is clear and moist.  Eyes: Conjunctivae are normal. Pupils are equal, round, and reactive to light.  Neck: Normal range of motion. Neck supple.  Cardiovascular: Normal rate, regular rhythm and normal heart sounds.   Pulmonary/Chest: Effort normal and breath sounds normal.  Abdominal: Soft. Bowel sounds are normal. She exhibits no mass. There is tenderness.  Mild diffuse tenderness  Musculoskeletal: Normal range of motion.  Neurological: She is alert.  Skin: Skin is warm and dry.  Nursing note and vitals reviewed.    UC Treatments / Results  Labs (all labs ordered are listed, but only abnormal results are displayed) Labs Reviewed - No data to display  EKG  EKG Interpretation None       Radiology No results found.  Procedures Procedures (including  critical care time)  Medications Ordered in UC Medications - No data to display   Initial Impression / Assessment and Plan / UC Course  I have reviewed the triage vital signs and the nursing notes.  Pertinent labs & imaging results that were available during my care of the patient were reviewed by me and considered in my medical decision making (see chart for details).  Clinical Course     Final Clinical Impressions(s) / UC Diagnoses   Final diagnoses:  Diarrhea of presumed infectious origin  Viral gastroenteritis    New Prescriptions New Prescriptions   ONDANSETRON (ZOFRAN-ODT) 8 MG DISINTEGRATING TABLET    Take 1 tablet (8 mg total) by mouth every 8 (eight) hours as needed for nausea.     Robyn Haber, MD 12/07/15 1128

## 2015-12-16 ENCOUNTER — Emergency Department (HOSPITAL_COMMUNITY): Payer: Self-pay

## 2015-12-16 ENCOUNTER — Emergency Department (HOSPITAL_COMMUNITY)
Admission: EM | Admit: 2015-12-16 | Discharge: 2015-12-16 | Disposition: A | Discharge status: Home / Self Care | Payer: Self-pay | Attending: Emergency Medicine | Admitting: Emergency Medicine

## 2015-12-16 ENCOUNTER — Encounter (HOSPITAL_COMMUNITY): Payer: Self-pay

## 2015-12-16 DIAGNOSIS — F17200 Nicotine dependence, unspecified, uncomplicated: Secondary | ICD-10-CM | POA: Insufficient documentation

## 2015-12-16 DIAGNOSIS — B349 Viral infection, unspecified: Secondary | ICD-10-CM | POA: Insufficient documentation

## 2015-12-16 LAB — CBC WITH DIFFERENTIAL/PLATELET
BASOS ABS: 0 10*3/uL (ref 0.0–0.1)
Basophils Relative: 0 %
EOS PCT: 3 %
Eosinophils Absolute: 0.2 10*3/uL (ref 0.0–0.7)
HEMATOCRIT: 35.3 % — AB (ref 36.0–46.0)
Hemoglobin: 12.4 g/dL (ref 12.0–15.0)
LYMPHS PCT: 33 %
Lymphs Abs: 1.5 10*3/uL (ref 0.7–4.0)
MCH: 27 pg (ref 26.0–34.0)
MCHC: 35.1 g/dL (ref 30.0–36.0)
MCV: 76.7 fL — AB (ref 78.0–100.0)
MONO ABS: 0.5 10*3/uL (ref 0.1–1.0)
MONOS PCT: 10 %
NEUTROS ABS: 2.5 10*3/uL (ref 1.7–7.7)
Neutrophils Relative %: 54 %
PLATELETS: 261 10*3/uL (ref 150–400)
RBC: 4.6 MIL/uL (ref 3.87–5.11)
RDW: 12.9 % (ref 11.5–15.5)
WBC: 4.6 10*3/uL (ref 4.0–10.5)

## 2015-12-16 LAB — LIPASE, BLOOD: LIPASE: 19 U/L (ref 11–51)

## 2015-12-16 LAB — COMPREHENSIVE METABOLIC PANEL
ALBUMIN: 3.5 g/dL (ref 3.5–5.0)
ALT: 28 U/L (ref 14–54)
AST: 28 U/L (ref 15–41)
Alkaline Phosphatase: 95 U/L (ref 38–126)
Anion gap: 7 (ref 5–15)
BILIRUBIN TOTAL: 0.2 mg/dL — AB (ref 0.3–1.2)
BUN: 5 mg/dL — AB (ref 6–20)
CHLORIDE: 105 mmol/L (ref 101–111)
CO2: 25 mmol/L (ref 22–32)
CREATININE: 0.6 mg/dL (ref 0.44–1.00)
Calcium: 8.9 mg/dL (ref 8.9–10.3)
GFR calc Af Amer: >60 mL/min (ref >60)
GLUCOSE: 120 mg/dL — AB (ref 65–99)
POTASSIUM: 3.7 mmol/L (ref 3.5–5.1)
Sodium: 137 mmol/L (ref 135–145)
TOTAL PROTEIN: 7 g/dL (ref 6.5–8.1)

## 2015-12-16 LAB — I-STAT BETA HCG BLOOD, ED (MC, WL, AP ONLY): I-stat hCG, quantitative: <5 m[IU]/mL (ref <5)

## 2015-12-16 MED ORDER — GUAIFENESIN-DM 100-10 MG/5ML PO SYRP
5.0000 mL | ORAL_SOLUTION | ORAL | Status: DC | PRN
  Administered 2015-12-16: 5 mL via ORAL
  Filled 2015-12-16: qty 5

## 2015-12-16 MED ORDER — ONDANSETRON HCL 4 MG/2ML IJ SOLN
4.0000 mg | Freq: Once | INTRAMUSCULAR | Status: AC
  Administered 2015-12-16: 4 mg via INTRAVENOUS
  Filled 2015-12-16: qty 2

## 2015-12-16 MED ORDER — ONDANSETRON HCL 8 MG PO TABS: 8.0000 mg | ORAL_TABLET | Freq: Three times a day (TID) | ORAL | 0 refills | Status: DC | PRN

## 2015-12-16 MED ORDER — SODIUM CHLORIDE 0.9 % IV BOLUS (SEPSIS)
1000.0000 mL | Freq: Once | INTRAVENOUS | Status: AC
  Administered 2015-12-16: 1000 mL via INTRAVENOUS

## 2015-12-16 NOTE — ED Notes (Signed)
Patient was provided with water per PO/fluid challenge and reports "everything is still coming back up and she is still nauseated." MD made aware.

## 2015-12-16 NOTE — Discharge Instructions (Signed)
Use Robitussin-DM, for cough  Start with clear liquids, then gradually advance your diet over the next 1 or 2 days.  For pain or fever, use Tylenol every 4 hours.

## 2015-12-16 NOTE — ED Notes (Signed)
Patient verbalized understanding of d/c instructions and denies any further needs or questions at this time. VS stable, pt escorted to ED entrance in wheelchair.

## 2015-12-16 NOTE — ED Notes (Signed)
Patient returned to room and placed back on monitor.  

## 2015-12-16 NOTE — ED Notes (Signed)
Pt transported to xray 

## 2015-12-16 NOTE — ED Provider Notes (Signed)
[facility] DEPT Provider Note   CSN: [id number] Arrival date & time: 12/16/15  1209     History   Chief Complaint Chief Complaint  Patient presents with  . vomiting, diarrhea    HPI Misty Murillo is a 25 y.o. female.  She is here for evaluation of an illness, spending 3 weeks, with nasal congestion, nausea, vomiting, diarrhea. She had transient improvement following treatment 2 weeks ago, Zofran, but now has increasing Toprol with nasal congestion, cough, fever and chills. She denies focal weakness, paresthesias, neck, back pain. There are no other known modifying factors.  HPI  Past Medical History:  Diagnosis Date  . Migraines     There are no active problems to display for this patient.   History reviewed. No pertinent surgical history.  OB History    Gravida Para Term Preterm AB Living   3 1 1  0 0 1   SAB TAB Ectopic Multiple Live Births   0 0 0 0 1       Home Medications    Prior to Admission medications   Medication Sig Start Date End Date Taking? Authorizing Provider  ondansetron (ZOFRAN) 8 MG tablet Take 1 tablet (8 mg total) by mouth every 8 (eight) hours as needed for nausea or vomiting. 12/16/15   Daleen Bo, MD  ondansetron (ZOFRAN-ODT) 8 MG disintegrating tablet Take 1 tablet (8 mg total) by mouth every 8 (eight) hours as needed for nausea. Patient not taking: Reported on 12/16/2015 12/07/15   Robyn Haber, MD    Family History Family History  Problem Relation Age of Onset  . Diabetes Mother   . Migraines Father   . Cancer Other     Social History Social History  Substance Use Topics  . Smoking status: Current Every Day Smoker  . Smokeless tobacco: Never Used  . Alcohol use No     Comment: rarely      Allergies   Patient has no known allergies.   Review of Systems Review of Systems  All other systems reviewed and are negative.    Physical Exam Updated Vital Signs BP 129/83   Pulse 97   Temp 98.3 F (36.8 C)  (Oral)   Resp 18   LMP 12/07/2015 (Approximate)   SpO2 100%   Physical Exam  Constitutional: She is oriented to person, place, and time. She appears well-developed and well-nourished.  HENT:  Head: Normocephalic and atraumatic.  Right Ear: External ear normal.  Left Ear: External ear normal.  Mouth/Throat: Oropharynx is clear and moist.  Eyes: Conjunctivae and EOM are normal. Pupils are equal, round, and reactive to light.  Neck: Normal range of motion and phonation normal. Neck supple.  Cardiovascular: Normal rate and regular rhythm.   Pulmonary/Chest: Effort normal and breath sounds normal. She exhibits no tenderness.  Abdominal: Soft. She exhibits no distension. There is no tenderness. There is no guarding.  Musculoskeletal: Normal range of motion.  Neurological: She is alert and oriented to person, place, and time. She exhibits normal muscle tone.  Skin: Skin is warm and dry.  Psychiatric: She has a normal mood and affect. Her behavior is normal. Judgment and thought content normal.  Nursing note and vitals reviewed.    ED Treatments / Results  Labs (all labs ordered are listed, but only abnormal results are displayed) Labs Reviewed  COMPREHENSIVE METABOLIC PANEL - Abnormal; Notable for the following:       Result Value   Glucose, Bld 120 (*)    BUN  5 (*)    Total Bilirubin 0.2 (*)    All other components within normal limits  CBC WITH DIFFERENTIAL/PLATELET - Abnormal; Notable for the following:    HCT 35.3 (*)    MCV 76.7 (*)    All other components within normal limits  LIPASE, BLOOD  I-STAT BETA HCG BLOOD, ED ([facility], [facility], [facility] ONLY)    EKG  EKG Interpretation None       Radiology Dg Chest 2 View  Result Date: 12/16/2015 CLINICAL DATA:  Patient with shortness of breath and productive cough. EXAM: CHEST  2 VIEW COMPARISON:  Chest radiograph 09/19/2015. FINDINGS: Normal cardiac and mediastinal contours. No consolidative pulmonary opacities. No pleural effusion or  pneumothorax. Mid thoracic spine degenerative changes. IMPRESSION: No active cardiopulmonary disease. Electronically Signed   By: Lovey Newcomer M.D.   On: 12/16/2015 16:25    Procedures Procedures (including critical care time)  Medications Ordered in ED Medications  guaiFENesin-dextromethorphan (ROBITUSSIN DM) 100-10 MG/5ML syrup 5 mL (5 mLs Oral Given 12/16/15 1527)  sodium chloride 0.9 % bolus 1,000 mL (0 mLs Intravenous Stopped 12/16/15 1516)  ondansetron (ZOFRAN) injection 4 mg (4 mg Intravenous Given 12/16/15 1528)  sodium chloride 0.9 % bolus 1,000 mL (0 mLs Intravenous Stopped 12/16/15 1637)     Initial Impression / Assessment and Plan / ED Course  I have reviewed the triage vital signs and the nursing notes.  Pertinent labs & imaging results that were available during my care of the patient were reviewed by me and considered in my medical decision making (see chart for details).  Clinical Course as of Dec 15 1636  Nancy Fetter Dec 16, 2015  1523 After fluid challenge with water, the patient coughed and vomited. Robitussin and Zofran ordered  [EW]    Clinical Course User Index [EW] Daleen Bo, MD    Medications  guaiFENesin-dextromethorphan (ROBITUSSIN DM) 100-10 MG/5ML syrup 5 mL (5 mLs Oral Given 12/16/15 1527)  sodium chloride 0.9 % bolus 1,000 mL (0 mLs Intravenous Stopped 12/16/15 1516)  ondansetron (ZOFRAN) injection 4 mg (4 mg Intravenous Given 12/16/15 1528)  sodium chloride 0.9 % bolus 1,000 mL (0 mLs Intravenous Stopped 12/16/15 1637)    Patient Vitals for the past 24 hrs:  BP Temp Temp src Pulse Resp SpO2  12/16/15 1545 129/83 - - 97 - 100 %  12/16/15 1530 126/86 - - 94 - 97 %  12/16/15 1500 134/79 - - 103 - 98 %  12/16/15 1445 131/71 - - 94 - 99 %  12/16/15 1430 123/73 - - 96 - 100 %  12/16/15 1415 127/76 - - 92 - 100 %  12/16/15 1400 127/80 - - 90 - 98 %  12/16/15 1345 127/81 - - 92 - 99 %  12/16/15 1300 132/82 - - 90 - 97 %  12/16/15 1245 115/66 - - 95 - 99  %  12/16/15 1217 144/80 98.3 F (36.8 C) Oral 88 18 99 %    4:38 PM Reevaluation with update and discussion. After initial assessment and treatment, an updated evaluation reveals She states that she is more comfortable at this time. Findings discussed with patient and all questions answered. Anaiza Behrens L    Final Clinical Impressions(s) / ED Diagnoses   Final diagnoses:  Viral syndrome    Ongoing symptoms for several weeks, without findings for serious illness. Suspect viral syndrome, waxing and waning versus back-to-back viral infections. Doubt serious bacterial infection. Metabolic instability or impending vascular collapse   Nursing Notes Reviewed/ Care Coordinated Applicable  Imaging Reviewed Interpretation of Laboratory Data incorporated into ED treatment  The patient appears reasonably screened and/or stabilized for discharge and I doubt any other medical condition or other [facility] requiring further screening, evaluation, or treatment in the ED at this time prior to discharge.  Plan: Home Medications- OTC meds prn,continue; Home Treatments- rest, gradually advance diet; return here if the recommended treatment, does not improve the symptoms; Recommended follow up- PCP prn  New Prescriptions New Prescriptions   ONDANSETRON (ZOFRAN) 8 MG TABLET    Take 1 tablet (8 mg total) by mouth every 8 (eight) hours as needed for nausea or vomiting.     Daleen Bo, MD 12/16/15 [phone]

## 2015-12-16 NOTE — ED Triage Notes (Signed)
Patient here with multiple complaints. Facial pain, congestion, cough, vomiting, diarrhea, chills and diaphoresis, NAD

## 2016-02-13 ENCOUNTER — Emergency Department (HOSPITAL_COMMUNITY)
Admission: EM | Admit: 2016-02-13 | Discharge: 2016-02-13 | Disposition: A | Discharge status: Home / Self Care | Payer: Medicaid Other | Attending: Emergency Medicine | Admitting: Emergency Medicine

## 2016-02-13 ENCOUNTER — Emergency Department (HOSPITAL_COMMUNITY): Payer: Medicaid Other

## 2016-02-13 ENCOUNTER — Encounter (HOSPITAL_COMMUNITY): Payer: Self-pay | Admitting: *Deleted

## 2016-02-13 DIAGNOSIS — J111 Influenza due to unidentified influenza virus with other respiratory manifestations: Secondary | ICD-10-CM | POA: Insufficient documentation

## 2016-02-13 DIAGNOSIS — R197 Diarrhea, unspecified: Secondary | ICD-10-CM

## 2016-02-13 DIAGNOSIS — R112 Nausea with vomiting, unspecified: Secondary | ICD-10-CM | POA: Insufficient documentation

## 2016-02-13 DIAGNOSIS — R69 Illness, unspecified: Secondary | ICD-10-CM

## 2016-02-13 DIAGNOSIS — F17200 Nicotine dependence, unspecified, uncomplicated: Secondary | ICD-10-CM | POA: Insufficient documentation

## 2016-02-13 LAB — I-STAT BETA HCG BLOOD, ED (MC, WL, AP ONLY)

## 2016-02-13 LAB — I-STAT CG4 LACTIC ACID, ED: LACTIC ACID, VENOUS: 1.65 mmol/L (ref 0.5–1.9)

## 2016-02-13 MED ORDER — ONDANSETRON 4 MG PO TBDP
4.0000 mg | ORAL_TABLET | Freq: Once | ORAL | Status: AC
  Administered 2016-02-13: 4 mg via ORAL
  Filled 2016-02-13: qty 1

## 2016-02-13 MED ORDER — ONDANSETRON HCL 4 MG PO TABS: 4.0000 mg | ORAL_TABLET | Freq: Four times a day (QID) | ORAL | 0 refills | Status: DC | PRN

## 2016-02-13 NOTE — ED Notes (Signed)
CBC ordered, no lavender top sent to main lab.

## 2016-02-13 NOTE — ED Provider Notes (Signed)
[facility] DEPT Provider Note   CSN: [id number] Arrival date & time: 02/13/16  1519   By signing my name below, I, Misty Murillo, attest that this documentation has been prepared under the direction and in the presence of Forde Dandy, MD. Electronically Signed: Soijett Murillo, ED Scribe. 02/13/16. 4:50 PM.  History   Chief Complaint Chief Complaint  Patient presents with  . Cough  . Emesis  . Fatigue    HPI Misty Murillo is a 26 y.o. female with a PMHx of migraine, who presents to the Emergency Department complaining of productive cough x yellow sputum onset yesterday. She is having associated symptoms of chest wall tenderness due to cough, back pain due to cough, sore throat, HA, generalized body aches, chills, diarrhea, nausea, and vomiting. Pt has sick contacts of her son who has had similar symptoms. She hasn't tried any medications for the relief of her symptoms. She denies fever and any other symptoms. Pt reports that she works at a [employer].   The history is provided by the patient. No language interpreter was used.    Past Medical History:  Diagnosis Date  . Migraines     There are no active problems to display for this patient.   History reviewed. No pertinent surgical history.  OB History    Gravida Para Term Preterm AB Living   3 1 1  0 0 1   SAB TAB Ectopic Multiple Live Births   0 0 0 0 1       Home Medications    Prior to Admission medications   Medication Sig Start Date End Date Taking? Authorizing Provider  ondansetron (ZOFRAN) 8 MG tablet Take 1 tablet (8 mg total) by mouth every 8 (eight) hours as needed for nausea or vomiting. 12/16/15   Daleen Bo, MD  ondansetron (ZOFRAN-ODT) 8 MG disintegrating tablet Take 1 tablet (8 mg total) by mouth every 8 (eight) hours as needed for nausea. Patient not taking: Reported on 12/16/2015 12/07/15   Robyn Haber, MD    Family History Family History  Problem Relation Age of Onset  . Diabetes  Mother   . Migraines Father   . Cancer Other     Social History Social History  Substance Use Topics  . Smoking status: Current Every Day Smoker  . Smokeless tobacco: Never Used  . Alcohol use No     Comment: rarely      Allergies   Patient has no known allergies.   Review of Systems Review of Systems 10/14 systems reviewed and are negative other than those stated in the HPI   Physical Exam Updated Vital Signs BP 147/70 (BP Location: Right Arm)   Pulse 106   Temp 99.5 F (37.5 C) (Oral)   Resp 22   Ht 5\' 4"  (1.626 m)   Wt 200 lb (90.7 kg)   LMP 01/30/2016   SpO2 100%   BMI 34.33 kg/m   Physical Exam Physical Exam  Nursing note and vitals reviewed. Constitutional: Appears low energy and fatigued. Well nourished, non-toxic, and in no acute distress Head: Normocephalic and atraumatic.  Mouth/Throat: Oropharynx is clear and moist.  Neck: Normal range of motion. Neck supple.  Cardiovascular: Normal rate and regular rhythm.   Pulmonary/Chest: Effort normal and breath sounds normal.  Abdominal: Soft. There is no tenderness. There is no rebound and no guarding.  Musculoskeletal: Normal range of motion.  Neurological: Alert, no facial droop, fluent speech, moves all extremities symmetrically Skin: Skin is warm and  dry.  Psychiatric: Cooperative   ED Treatments / Results  DIAGNOSTIC STUDIES: Oxygen Saturation is 100% on RA, nl by my interpretation.    COORDINATION OF CARE: 4:47 PM Discussed treatment plan with pt at bedside which includes CXR, labs, zofran, and pt agreed to plan.   Labs (all labs ordered are listed, but only abnormal results are displayed) Labs Reviewed  I-STAT BETA HCG BLOOD, ED ([facility], [facility], [facility] ONLY)  I-STAT CG4 LACTIC ACID, ED    Radiology Dg Chest 2 View  Result Date: 02/13/2016 CLINICAL DATA:  Productive cough, shortness of breath, midsternal chest pain, chills, and nausea beginning last night. History of pneumonia in September 2017. Un  clear smoking history. EXAM: CHEST  2 VIEW COMPARISON:  PA and lateral chest x-ray of December 16, 2015 FINDINGS: The lungs are well-expanded. There is no focal infiltrate. The interstitial markings are coarse bilaterally but this is not new. The heart and pulmonary vascularity are normal. The mediastinum is normal in width. The trachea is midline. The bony thorax exhibits no acute abnormality. IMPRESSION: There is no pneumonia nor other acute cardiopulmonary abnormality. Electronically Signed   By: David  Martinique M.D.   On: 02/13/2016 16:23    Procedures Procedures (including critical care time)  Medications Ordered in ED Medications  ondansetron (ZOFRAN-ODT) disintegrating tablet 4 mg (not administered)     Initial Impression / Assessment and Plan / ED Course  I have reviewed the triage vital signs and the nursing notes.  Pertinent labs & imaging results that were available during my care of the patient were reviewed by me and considered in my medical decision making (see chart for details).     Presenting with 1 day of cough, congestion, nausea, vomiting and diarrhea. Presentation consistent with that of a influenza-like illness. Chest x-ray visualized and shows no acute cardiopulmonary processes.  Exam otherwise reassuring. Discussed continued supportive care instructions. Strict return and follow-up instructions reviewed. She expressed understanding of all discharge instructions and felt comfortable with the plan of care.    Final Clinical Impressions(s) / ED Diagnoses   Final diagnoses:  Influenza-like illness    New Prescriptions New Prescriptions   No medications on file   I personally performed the services described in this documentation, which was scribed in my presence. The recorded information has been reviewed and is accurate.     Forde Dandy, MD 02/13/16 [phone]

## 2016-02-13 NOTE — ED Triage Notes (Signed)
Pt reports onset of cough yesterday. Has chest wall pain and back pain when coughing and breathing. Has chills but denies any fever. Has headache, sore throat and bodyaches. Reports cough is productive with yellow sputum and had episode of n/v today.

## 2016-02-13 NOTE — ED Notes (Signed)
Pt given Ginger Ale upon discharge per Nicole(RN)

## 2016-02-13 NOTE — Discharge Instructions (Signed)
Your chest x-ray does not show pneumonia. You have a flu like illness. This can take 1-2 weeks to fully get better, and you will probably feel the first for the first 3-4 days of the illness.   Take nausea medication as needed for vomiting. Take ibuprofen and tylenol for aches and pains. Drink plenty of fluids and get plenty of rest.  Return without fail for worsening symptoms, including persistent fever > 5-6 days, confusion, intractable vomiting, escalating pains, or any other symptoms concerning to you.

## 2016-02-13 NOTE — ED Notes (Signed)
Pt is in stable condition upon d/c and is escorted from ED via wheelchair. 

## 2016-07-28 ENCOUNTER — Emergency Department (HOSPITAL_COMMUNITY)
Admission: EM | Admit: 2016-07-28 | Discharge: 2016-07-28 | Disposition: A | Discharge status: Home / Self Care | Payer: Medicaid Other | Attending: Emergency Medicine | Admitting: Emergency Medicine

## 2016-07-28 ENCOUNTER — Encounter (HOSPITAL_COMMUNITY): Payer: Self-pay | Admitting: Emergency Medicine

## 2016-07-28 DIAGNOSIS — R197 Diarrhea, unspecified: Secondary | ICD-10-CM | POA: Insufficient documentation

## 2016-07-28 DIAGNOSIS — F17210 Nicotine dependence, cigarettes, uncomplicated: Secondary | ICD-10-CM | POA: Diagnosis not present

## 2016-07-28 DIAGNOSIS — R112 Nausea with vomiting, unspecified: Secondary | ICD-10-CM

## 2016-07-28 LAB — COMPREHENSIVE METABOLIC PANEL
ALBUMIN: 3.5 g/dL (ref 3.5–5.0)
ALT: 33 U/L (ref 14–54)
AST: 27 U/L (ref 15–41)
Alkaline Phosphatase: 108 U/L (ref 38–126)
Anion gap: 8 (ref 5–15)
CHLORIDE: 105 mmol/L (ref 101–111)
CO2: 25 mmol/L (ref 22–32)
CREATININE: 0.49 mg/dL (ref 0.44–1.00)
Calcium: 9.2 mg/dL (ref 8.9–10.3)
GFR calc Af Amer: >60 mL/min (ref >60)
GLUCOSE: 106 mg/dL — AB (ref 65–99)
POTASSIUM: 3.8 mmol/L (ref 3.5–5.1)
Sodium: 138 mmol/L (ref 135–145)
Total Bilirubin: 0.6 mg/dL (ref 0.3–1.2)
Total Protein: 7 g/dL (ref 6.5–8.1)

## 2016-07-28 LAB — LIPASE, BLOOD: LIPASE: 18 U/L (ref 11–51)

## 2016-07-28 LAB — CBC
HCT: 34.6 % — ABNORMAL LOW (ref 36.0–46.0)
Hemoglobin: 12 g/dL (ref 12.0–15.0)
MCH: 26.4 pg (ref 26.0–34.0)
MCHC: 34.7 g/dL (ref 30.0–36.0)
MCV: 76.2 fL — AB (ref 78.0–100.0)
PLATELETS: 264 10*3/uL (ref 150–400)
RBC: 4.54 MIL/uL (ref 3.87–5.11)
RDW: 12.7 % (ref 11.5–15.5)
WBC: 5.2 10*3/uL (ref 4.0–10.5)

## 2016-07-28 LAB — URINALYSIS, ROUTINE W REFLEX MICROSCOPIC
BILIRUBIN URINE: NEGATIVE
GLUCOSE, UA: NEGATIVE mg/dL
HGB URINE DIPSTICK: NEGATIVE
KETONES UR: NEGATIVE mg/dL
Leukocytes, UA: NEGATIVE
Nitrite: NEGATIVE
PH: 5 (ref 5.0–8.0)
PROTEIN: NEGATIVE mg/dL
Specific Gravity, Urine: 1.01 (ref 1.005–1.030)

## 2016-07-28 LAB — PREGNANCY, URINE: PREG TEST UR: NEGATIVE

## 2016-07-28 MED ORDER — ONDANSETRON HCL 4 MG/2ML IJ SOLN
4.0000 mg | Freq: Once | INTRAMUSCULAR | Status: AC
  Administered 2016-07-28: 4 mg via INTRAVENOUS
  Filled 2016-07-28: qty 2

## 2016-07-28 MED ORDER — DICYCLOMINE HCL 20 MG PO TABS
20.0000 mg | ORAL_TABLET | Freq: Once | ORAL | Status: AC
  Administered 2016-07-28: 20 mg via ORAL
  Filled 2016-07-28: qty 1

## 2016-07-28 MED ORDER — SODIUM CHLORIDE 0.9 % IV BOLUS (SEPSIS)
1000.0000 mL | Freq: Once | INTRAVENOUS | Status: AC
  Administered 2016-07-28: 1000 mL via INTRAVENOUS

## 2016-07-28 MED ORDER — DICYCLOMINE HCL 20 MG PO TABS: 20.0000 mg | ORAL_TABLET | Freq: Two times a day (BID) | ORAL | 0 refills | Status: DC | PRN

## 2016-07-28 MED ORDER — ONDANSETRON 4 MG PO TBDP: 4.0000 mg | ORAL_TABLET | Freq: Three times a day (TID) | ORAL | 0 refills | Status: DC | PRN

## 2016-07-28 NOTE — Discharge Instructions (Signed)
Follow up with your primary care doctor to discuss your hospital visit. Continue to hydrate orally with small sips of fluids throughout the day. Use Zofran as directed for nausea & vomiting. Bentyl as needed for abdominal cramping.  The 'BRAT' diet is suggested, then progress to diet as tolerated as symptoms abate.  Bananas.  Rice.  Applesauce.  Toast (and other simple starches such as crackers, potatoes, noodles).   SEEK IMMEDIATE MEDICAL ATTENTION IF: You begin having localized abdominal pain that does not go away or becomes severe A temperature above 101 develops Repeated vomiting occurs (multiple uncontrollable episodes) or you are unable to keep fluids down Blood is being passed in stools or vomit (bright red or black tarry stools).  New or worsening symptoms develop, any additional concerns.

## 2016-07-28 NOTE — ED Provider Notes (Signed)
[facility] DEPT Provider Note   CSN: [id number] Arrival date & time: 07/28/16  0806     History   Chief Complaint Chief Complaint  Patient presents with  . Abdominal Cramping  . Emesis  . Nausea  . Diarrhea    HPI Misty Murillo is a 26 y.o. female.  The history is provided by the patient and medical records. No language interpreter was used.  Abdominal Cramping  Associated symptoms include abdominal pain.  Emesis   Associated symptoms include abdominal pain and diarrhea.  Diarrhea   Associated symptoms include abdominal pain and vomiting.   Misty Murillo is a 26 y.o. female  with a PMH of migraines who presents to the Emergency Department complaining of lower abdominal pain described as cramping which began 2-3 days ago. The following day, she developed nausea and had 4-5 episodes of emesis. This morning, she awoke with nonbloody diarrhea. Of note, her six-year-old son came home from summer camp last week with similar symptoms. Her mother gave her use Zofran on Saturday which did slow down emesis, however she did not have anymore of this medication. No fever or chills. No dysuria, vaginal discharge, back pain, chest pain or trouble breathing.   Past Medical History:  Diagnosis Date  . Migraines     There are no active problems to display for this patient.   History reviewed. No pertinent surgical history.  OB History    Gravida Para Term Preterm AB Living   3 1 1  0 0 1   SAB TAB Ectopic Multiple Live Births   0 0 0 0 1       Home Medications    Prior to Admission medications   Medication Sig Start Date End Date Taking? Authorizing Provider  dicyclomine (BENTYL) 20 MG tablet Take 1 tablet (20 mg total) by mouth 2 (two) times daily as needed (abdominal cramping). 07/28/16   Haizley Cannella, Ozella Almond, PA-C  ondansetron (ZOFRAN ODT) 4 MG disintegrating tablet Take 1 tablet (4 mg total) by mouth every 8 (eight) hours as needed for nausea or vomiting. 07/28/16   Gabbriella Presswood, Ozella Almond, PA-C    Family History Family History  Problem Relation Age of Onset  . Diabetes Mother   . Migraines Father   . Cancer Other     Social History Social History  Substance Use Topics  . Smoking status: Current Every Day Smoker  . Smokeless tobacco: Never Used  . Alcohol use No     Comment: rarely      Allergies   Patient has no known allergies.   Review of Systems Review of Systems  Gastrointestinal: Positive for abdominal pain, diarrhea, nausea and vomiting. Negative for blood in stool and constipation.  All other systems reviewed and are negative.    Physical Exam Updated Vital Signs BP 130/73 (BP Location: Left Arm)   Pulse 78   Temp 98.3 F (36.8 C) (Oral)   Resp 16   LMP 07/18/2016   SpO2 100%   Physical Exam  Constitutional: She is oriented to person, place, and time. She appears well-developed and well-nourished. No distress.  Non-toxic appearing.  HENT:  Head: Normocephalic and atraumatic.  Cardiovascular: Normal rate, regular rhythm and normal heart sounds.   No murmur heard. Pulmonary/Chest: Effort normal and breath sounds normal. No respiratory distress.  Abdominal: Soft. Bowel sounds are normal. She exhibits no distension.  Mild generalized abdominal tenderness with no rebound or guarding. No focal tenderness at McBurney's. No CVA tenderness.  Neurological:  She is alert and oriented to person, place, and time.  Skin: Skin is warm and dry.  Nursing note and vitals reviewed.    ED Treatments / Results  Labs (all labs ordered are listed, but only abnormal results are displayed) Labs Reviewed  COMPREHENSIVE METABOLIC PANEL - Abnormal; Notable for the following:       Result Value   Glucose, Bld 106 (*)    BUN <5 (*)    All other components within normal limits  CBC - Abnormal; Notable for the following:    HCT 34.6 (*)    MCV 76.2 (*)    All other components within normal limits  URINALYSIS, ROUTINE W REFLEX MICROSCOPIC -  Abnormal; Notable for the following:    APPearance HAZY (*)    All other components within normal limits  LIPASE, BLOOD  PREGNANCY, URINE    EKG  EKG Interpretation None       Radiology No results found.  Procedures Procedures (including critical care time)  Medications Ordered in ED Medications  sodium chloride 0.9 % bolus 1,000 mL (0 mLs Intravenous Stopped 07/28/16 1506)  ondansetron (ZOFRAN) injection 4 mg (4 mg Intravenous Given 07/28/16 1329)  dicyclomine (BENTYL) tablet 20 mg (20 mg Oral Given 07/28/16 1421)     Initial Impression / Assessment and Plan / ED Course  I have reviewed the triage vital signs and the nursing notes.  Pertinent labs & imaging results that were available during my care of the patient were reviewed by me and considered in my medical decision making (see chart for details).    Misty Murillo is a 26 y.o. female who presents to ED for nausea, vomiting and non-bloody diarrhea for 2-3 days. 9 year-old son at home with similar sxs. On exam, patient is afebrile, non-toxic appearing with a non-surgical abdominal exam. IV fluids, zofran and bentyl given. Labs reviewed and reassuring. On repeat examination, patient is now tolerating PO with no episodes of emesis since medication administration. Repeat abdominal exam reassuring. Sxs c/w viral etiology. Evaluation does not show pathology that would require ongoing emergent intervention or inpatient treatment. Rx for zofran and bentyl given. PCP follow up encouraged. Spoke at length with patient about signs or symptoms that should prompt return to emergency Department including inability to tolerate PO, blood in the stools, fevers, focal localization of abdominal pain, new/worsening symptoms or any additional concerns. Patient understands diagnosis and plan of care as dictated above. All questions answered.   Final Clinical Impressions(s) / ED Diagnoses   Final diagnoses:  Nausea vomiting and diarrhea    New  Prescriptions New Prescriptions   DICYCLOMINE (BENTYL) 20 MG TABLET    Take 1 tablet (20 mg total) by mouth 2 (two) times daily as needed (abdominal cramping).   ONDANSETRON (ZOFRAN ODT) 4 MG DISINTEGRATING TABLET    Take 1 tablet (4 mg total) by mouth every 8 (eight) hours as needed for nausea or vomiting.     Zhoey Blackstock, Ozella Almond, PA-C 07/28/16 1507    Milton Ferguson, MD 07/28/16 1536

## 2016-07-28 NOTE — ED Triage Notes (Signed)
Pt. Stated, I started having abdominal cramping, and then nausea, vomiting, and diarrhea started last night.

## 2016-07-28 NOTE — ED Notes (Signed)
Called to add on urine preg

## 2016-08-21 ENCOUNTER — Ambulatory Visit (HOSPITAL_COMMUNITY)
Admission: EM | Admit: 2016-08-21 | Discharge: 2016-08-21 | Disposition: A | Discharge status: Home / Self Care | Payer: Medicaid Other | Attending: Family Medicine | Admitting: Family Medicine

## 2016-08-21 ENCOUNTER — Encounter (HOSPITAL_COMMUNITY): Payer: Self-pay | Admitting: Family Medicine

## 2016-08-21 DIAGNOSIS — M25561 Pain in right knee: Secondary | ICD-10-CM | POA: Diagnosis not present

## 2016-08-21 DIAGNOSIS — M7050 Other bursitis of knee, unspecified knee: Secondary | ICD-10-CM

## 2016-08-21 MED ORDER — DICLOFENAC SODIUM 75 MG PO TBEC: 75.0000 mg | DELAYED_RELEASE_TABLET | Freq: Two times a day (BID) | ORAL | 0 refills | Status: DC

## 2016-08-21 NOTE — ED Triage Notes (Signed)
Pt here for right knee pain and tenderness with swelling. sts that she hit it on a box spring a few weeks ago.

## 2016-08-24 NOTE — ED Provider Notes (Signed)
  [facility]   [id number] 08/21/16 Arrival Time: 0093  ASSESSMENT & PLAN:  1. Acute pain of right knee   2. Pes anserine bursitis     Meds ordered this encounter  Medications  . diclofenac (VOLTAREN) 75 MG EC tablet    Sig: Take 1 tablet (75 mg total) by mouth 2 (two) times daily.    Dispense:  14 tablet    Refill:  0    Reviewed expectations re: course of current medical issues. Questions answered. Outlined signs and symptoms indicating need for more acute intervention. Patient verbalized understanding. After Visit Summary given.   SUBJECTIVE:  Misty Murillo is a 26 y.o. female who presents with complaint of R knee discomfort and tenderness for 2 weeks. Did "jam" it on a box spring a few weeks ago. No immediate pain. Gradual onset of symptoms. Pain worse in evening but able to sleep. OTC with mild help. No LE weakness or sensation changes. No knee swelling. Describes dull and sharp pains randomly.  ROS: As per HPI.   OBJECTIVE:  Vitals:   08/21/16 1745  BP: (!) 142/83  Pulse: 80  Resp: 18  Temp: 98.6 F (37 C)  SpO2: 100%     General appearance: alert; no distress Back: no CVA tenderness Extremities: R knee with point tenderness over pes anserine bursa; no knee swelling; FROM; normal strength Skin: warm and dry   No Known Allergies  PMHx, SurgHx, SocialHx, Medications, and Allergies were reviewed in the Visit Navigator and updated as appropriate.      Vanessa Kick, MD 08/24/16 [phone]

## 2016-11-13 ENCOUNTER — Encounter (HOSPITAL_COMMUNITY): Payer: Self-pay | Admitting: Emergency Medicine

## 2016-11-13 ENCOUNTER — Ambulatory Visit (HOSPITAL_COMMUNITY)
Admission: EM | Admit: 2016-11-13 | Discharge: 2016-11-13 | Disposition: A | Discharge status: Home / Self Care | Payer: Medicaid Other | Attending: Family Medicine | Admitting: Family Medicine

## 2016-11-13 DIAGNOSIS — B349 Viral infection, unspecified: Secondary | ICD-10-CM

## 2016-11-13 DIAGNOSIS — R059 Cough, unspecified: Secondary | ICD-10-CM

## 2016-11-13 DIAGNOSIS — R05 Cough: Secondary | ICD-10-CM

## 2016-11-13 DIAGNOSIS — J029 Acute pharyngitis, unspecified: Secondary | ICD-10-CM

## 2016-11-13 MED ORDER — HYDROCODONE-HOMATROPINE 5-1.5 MG/5ML PO SYRP
5.0000 mL | ORAL_SOLUTION | Freq: Four times a day (QID) | ORAL | 0 refills | Status: DC | PRN
Start: 2016-11-13 — End: 2017-02-16

## 2016-11-13 NOTE — ED Triage Notes (Signed)
Onset Wednesday at 3:00pm.  Complains of sob, sore throat, roof of mouth is sore.  Ears hurt with swallowing.  Vomited twice.  Frequent coughing , sneezing, swallowing

## 2016-11-13 NOTE — ED Provider Notes (Signed)
  [facility]   [id number] 11/13/16 Arrival Time: 3536  ASSESSMENT & PLAN:  1. Viral syndrome   2. Sore throat   3. Cough     Meds ordered this encounter  Medications  . HYDROcodone-homatropine (HYCODAN) 5-1.5 MG/5ML syrup    Sig: Take 5 mLs by mouth every 6 (six) hours as needed for cough.    Dispense:  90 mL    Refill:  0    OTC symptom care as needed. Medication sedation precautions. Work note given. She will f/u with any new or concerning symptoms.  Reviewed expectations re: course of current medical issues. Questions answered. Outlined signs and symptoms indicating need for more acute intervention. Patient verbalized understanding. After Visit Summary given.   SUBJECTIVE:  Misty Murillo is a 26 y.o. female who presents with complaint of nasal congestion, post-nasal drainage, and a persistent cough. Also with mild sore throat. One episode of post-tussive emesis. Tolerating normal PO intake. Afebrile. Mild body aches. Onset abrupt approximately 1 day ago. Overall fatigued. SOB: none. Wheezing: none. Coworkers with similar illness. Smokes cigarettes. OTC treatment: None.  ROS: As per HPI.   OBJECTIVE:  Vitals:   11/13/16 1353  BP: 134/86  Pulse: 97  Resp: (!) 22  Temp: 99.1 F (37.3 C)  TempSrc: Oral  SpO2: 97%    Recheck RR: 18 General appearance: alert; no distress HEENT: nasal congestion; clear runny nose; throat irritation secondary to post-nasal drainage Neck: supple without LAD Lungs: clear to auscultation bilaterally; active cough Skin: warm and dry Psychological: alert and cooperative; normal mood and affect  No Known Allergies  Past Medical History:  Diagnosis Date  . Migraines    Social History   Social History  . Marital status: Single    Spouse name: N/A  . Number of children: N/A  . Years of education: N/A   Occupational History  . Not on file.   Social History Main Topics  . Smoking status: Current Every Day Smoker  .  Smokeless tobacco: Never Used  . Alcohol use No     Comment: rarely   . Drug use: No  . Sexual activity: Not Currently   Other Topics Concern  . Not on file   Social History Narrative  . No narrative on file   Family History  Problem Relation Age of Onset  . Diabetes Mother   . Migraines Father   . Cancer Other            Vanessa Kick, MD 11/13/16 [phone]

## 2017-01-28 ENCOUNTER — Encounter (HOSPITAL_COMMUNITY): Payer: Self-pay | Admitting: Emergency Medicine

## 2017-01-28 ENCOUNTER — Ambulatory Visit (HOSPITAL_COMMUNITY)
Admission: EM | Admit: 2017-01-28 | Discharge: 2017-01-28 | Disposition: A | Discharge status: Home / Self Care | Payer: Medicaid Other | Attending: Family Medicine | Admitting: Family Medicine

## 2017-01-28 ENCOUNTER — Other Ambulatory Visit: Payer: Self-pay

## 2017-01-28 DIAGNOSIS — L509 Urticaria, unspecified: Secondary | ICD-10-CM

## 2017-01-28 MED ORDER — PREDNISONE 10 MG (48) PO TBPK: ORAL_TABLET | ORAL | 0 refills | Status: DC

## 2017-01-28 NOTE — Discharge Instructions (Signed)
In addition to the prednisone prescribed you may use over the counter Benadryl. Follow up with your primary doctor or here if you are not seeing significant improvement over the next 24-48 hours.

## 2017-01-28 NOTE — ED Triage Notes (Signed)
Reports random rash and hives on different parts of body.  Noticed this for a week.  Patient not sure what is triggering this response

## 2017-01-28 NOTE — ED Provider Notes (Signed)
  [facility]   [id number] 01/28/17 Arrival Time: 1003  ASSESSMENT & PLAN:  1. Hives     Meds ordered this encounter  Medications  . predniSONE (STERAPRED UNI-PAK 48 TAB) 10 MG (48) TBPK tablet    Sig: Take as directed.    Dispense:  48 tablet    Refill:  0   In addition to the prednisone prescribed you may use over the counter Benadryl. Follow up with your primary doctor or here if you are not seeing significant improvement over the next 24-48 hours.  Reviewed expectations re: course of current medical issues. Questions answered. Outlined signs and symptoms indicating need for more acute intervention. Patient verbalized understanding. After Visit Summary given.   SUBJECTIVE:  Misty Murillo is a 27 y.o. female who presents with complaint of:   Rash Patient presents for evaluation of a rash involving the torso mainly; some on extremities. Onset abrupt several days ago. Lesions are red and skin colored and raised in texture. Reports that the rash comes and goes changed over time and is pruritic. Associated symptoms: none. She denies: abdominal pain, arthralgia, fever, myalgia and sore throat. Reports that she has not had contacts with similar rash. She has not had new exposures (soaps, lotions, laundry detergents, foods, medications, plants, insects or animals). She has not identified precipitant. Environmental exposures or allergies: none No recent travel. Benadryl with very mild, temporary help.   ROS: As per HPI.  OBJECTIVE: Vitals:   01/28/17 1032  BP: 124/72  Pulse: 84  Resp: 20  Temp: 98.2 F (36.8 C)  TempSrc: Oral  SpO2: 100%    General appearance: alert; no distress Lungs: clear to auscultation bilaterally Heart: regular rate and rhythm Extremities: no edema Skin: warm and dry; diffuse wheals over torso and extremities Psychological: alert and cooperative; normal mood and affect  No Known Allergies  Past Medical History:  Diagnosis Date  .  Migraines    Social History   Socioeconomic History  . Marital status: Single    Spouse name: Not on file  . Number of children: Not on file  . Years of education: Not on file  . Highest education level: Not on file  Social Needs  . Financial resource strain: Not on file  . Food insecurity - worry: Not on file  . Food insecurity - inability: Not on file  . Transportation needs - medical: Not on file  . Transportation needs - non-medical: Not on file  Occupational History  . Not on file  Tobacco Use  . Smoking status: Current Every Day Smoker  . Smokeless tobacco: Never Used  Substance and Sexual Activity  . Alcohol use: No    Comment: rarely   . Drug use: No  . Sexual activity: Not Currently  Other Topics Concern  . Not on file  Social History Narrative  . Not on file   Family History  Problem Relation Age of Onset  . Diabetes Mother   . Migraines Father   . Cancer Other       Vanessa Kick, MD 01/28/17 1144

## 2017-02-16 ENCOUNTER — Encounter (HOSPITAL_COMMUNITY): Payer: Self-pay | Admitting: Emergency Medicine

## 2017-02-16 ENCOUNTER — Ambulatory Visit (HOSPITAL_COMMUNITY)
Admission: EM | Admit: 2017-02-16 | Discharge: 2017-02-16 | Disposition: A | Discharge status: Home / Self Care | Payer: Medicaid Other | Attending: Family Medicine | Admitting: Family Medicine

## 2017-02-16 DIAGNOSIS — R21 Rash and other nonspecific skin eruption: Secondary | ICD-10-CM

## 2017-02-16 DIAGNOSIS — L509 Urticaria, unspecified: Secondary | ICD-10-CM | POA: Diagnosis not present

## 2017-02-16 MED ORDER — METHYLPREDNISOLONE SODIUM SUCC 125 MG IJ SOLR
INTRAMUSCULAR | Status: AC
Start: 2017-02-16 — End: ?
  Filled 2017-02-16: qty 2

## 2017-02-16 MED ORDER — METHYLPREDNISOLONE SODIUM SUCC 125 MG IJ SOLR
125.0000 mg | Freq: Once | INTRAMUSCULAR | Status: AC
  Administered 2017-02-16: 125 mg via INTRAMUSCULAR

## 2017-02-16 MED ORDER — EPINEPHRINE 0.3 MG/0.3ML IJ SOAJ: 0.3000 mg | Freq: Once | INTRAMUSCULAR | 1 refills | Status: AC

## 2017-02-16 MED ORDER — HYDROXYZINE PAMOATE 25 MG PO CAPS: ORAL_CAPSULE | ORAL | 0 refills | Status: DC

## 2017-02-16 NOTE — ED Provider Notes (Signed)
[facility]    CSN: [id number] Arrival date & time: 02/16/17  1112     History   Chief Complaint Chief Complaint  Patient presents with  . Urticaria    HPI Misty Murillo is a 27 y.o. female.   27 year old female presents with continued difficulty with recurrent hives/rash. Was seen on 01/28/17 (about 3 weeks ago) for same symptoms and placed on Prednisone 12 day taper and Benadryl. She took 3 days of Prednisone at first and stopped since it did not seem to be helping. She started the Prednisone again yesterday and has taken 20mg  for the past 2 days. Her hives continue to appear in varying locations but mainly on her face, neck, trunk, upper extremities and upper legs. No hives have occurred on her lower legs or feet. They appear as red or dark skin colored, raised, slightly tender and itchy lesions. She indicates that she has had "hives" off and on for the past 7 years. Recently her lips have started swelling, as well as her eyelids and her throat has been tight and she has experienced some wheezing with these reactions. She still does not know any triggers. She denies any fever, sore throat, nausea, vomiting, diarrhea or abdominal pain. She also denies any exposure to new soaps, detergents, foods, or sleeping environment. She has not seen an Allergist or ENT specialist. She has no other chronic health issues except migraine headaches and has tried Zyrtec, Claritin and Benadryl in the past with no success.    The history is provided by the patient.    Past Medical History:  Diagnosis Date  . Migraines     There are no active problems to display for this patient.   History reviewed. No pertinent surgical history.  OB History    Gravida Para Term Preterm AB Living   3 1 1  0 0 1   SAB TAB Ectopic Multiple Live Births   0 0 0 0 1       Home Medications    Prior to Admission medications   Medication Sig Start Date End Date Taking? Authorizing Provider    diphenhydrAMINE (BENADRYL) 25 mg capsule Take 25 mg by mouth every 6 (six) hours as needed.   Yes [provider]  predniSONE (STERAPRED UNI-PAK 48 TAB) 10 MG (48) TBPK tablet Take as directed. 01/28/17  Yes Vanessa Kick, MD  hydrOXYzine (VISTARIL) 25 MG capsule Take 1 to 2 capsules every 8 hours as needed for hives/itching. 02/16/17   Katy Apo, NP    Family History Family History  Problem Relation Age of Onset  . Diabetes Mother   . Migraines Father   . Cancer Other     Social History Social History   Tobacco Use  . Smoking status: Current Every Day Smoker  . Smokeless tobacco: Never Used  Substance Use Topics  . Alcohol use: No    Comment: rarely   . Drug use: No     Allergies   Patient has no known allergies.   Review of Systems Review of Systems  Constitutional: Negative for activity change, appetite change, chills, fatigue and fever.  HENT: Positive for facial swelling. Negative for congestion, mouth sores, postnasal drip, sneezing, sore throat and trouble swallowing.   Eyes: Negative for pain, discharge, redness and itching.  Respiratory: Positive for cough, chest tightness and wheezing. Negative for shortness of breath and stridor.   Gastrointestinal: Negative for abdominal pain, diarrhea, nausea and vomiting.  Musculoskeletal: Negative for  arthralgias, myalgias, neck pain and neck stiffness.  Skin: Positive for color change and rash. Negative for wound.  Allergic/Immunologic: Positive for environmental allergies.  Neurological: Negative for dizziness, tremors, seizures, syncope, speech difficulty, weakness, light-headedness, numbness and headaches.  Hematological: Negative for adenopathy. Does not bruise/bleed easily.  Psychiatric/Behavioral: Negative.      Physical Exam Triage Vital Signs ED Triage Vitals  Enc Vitals Group     BP 02/16/17 1256 124/67     Pulse Rate 02/16/17 1256 75     Resp 02/16/17 1256 20     Temp 02/16/17 1256 98.4 F  (36.9 C)     Temp Source 02/16/17 1256 Oral     SpO2 02/16/17 1256 100 %     Weight --      Height --      Head Circumference --      Peak Flow --      Pain Score 02/16/17 1257 7     Pain Loc --      Pain Edu? --      Excl. in [place]? --    No data found.  Updated Vital Signs BP 124/67 (BP Location: Left Arm)   Pulse 75   Temp 98.4 F (36.9 C) (Oral)   Resp 20   LMP 02/09/2017   SpO2 100%   Visual Acuity Right Eye Distance:   Left Eye Distance:   Bilateral Distance:    Right Eye Near:   Left Eye Near:    Bilateral Near:     Physical Exam  Constitutional: She is oriented to person, place, and time. She appears well-developed and well-nourished. No distress.  Patient sitting comfortably on exam table in no acute distress.   HENT:  Head: Normocephalic and atraumatic.  Right Ear: External ear normal.  Left Ear: External ear normal.  Nose: Nose normal.  Mouth/Throat: Uvula is midline, oropharynx is clear and moist and mucous membranes are normal. No uvula swelling. No oropharyngeal exudate, posterior oropharyngeal edema or posterior oropharyngeal erythema.  Eyes: Conjunctivae and EOM are normal.  Neck: Normal range of motion. Neck supple.  Cardiovascular: Normal rate, regular rhythm and normal heart sounds.  No murmur heard. Pulmonary/Chest: Effort normal and breath sounds normal. No stridor. No respiratory distress. She has no decreased breath sounds. She has no wheezes. She has no rhonchi. She has no rales.  Musculoskeletal: Normal range of motion.  Neurological: She is alert and oriented to person, place, and time.  Skin: Skin is warm, dry and intact. Capillary refill takes less than 2 seconds. Rash noted. Rash is urticarial.     Scattered wheals present on neck, upper trunk, abdomen, lower arms bilaterally and upper thighs, varying in size from about 106mm to over 21mm. Usually red or dark brown, raised with no discharge or crusting. No surrounding erythema or irritation.    Psychiatric: She has a normal mood and affect. Her behavior is normal. Judgment and thought content normal.     UC Treatments / Results  Labs (all labs ordered are listed, but only abnormal results are displayed) Labs Reviewed - No data to display  EKG  EKG Interpretation None       Radiology No results found.  Procedures Procedures (including critical care time)  Medications Ordered in UC Medications  methylPREDNISolone sodium succinate (SOLU-MEDROL) 125 mg/2 mL injection 125 mg (125 mg Intramuscular Given 02/16/17 1348)     Initial Impression / Assessment and Plan / UC Course  I have reviewed the triage vital signs and  the nursing notes.  Pertinent labs & imaging results that were available during my care of the patient were reviewed by me and considered in my medical decision making (see chart for details).    Discussed with patient still uncertain of cause of urticaria. Will trial higher dose steroid injection today. Gave SoluMedrol 125mg  once IM. Hold off on taking additional oral Prednisone. Recommend start Vistaril (Atarax) 25mg  1 to 2 capsules every 8 hours as needed for hives and itching. Recommend apply cool compresses to area as needed for comfort. Discussed need and use of Epi-pen since now having more anaphylactic-type of reactions. Note written for work for today. Recommend call either the [facility] [place] or [facility] office today for an appointment for further evaluation. If swelling of throat, lips or any difficulty breathing occurs, use Epi-pen and call 911. Otherwise, follow-up with the specialists as planned or go to ER if symptoms worsen.    Final Clinical Impressions(s) / UC Diagnoses   Final diagnoses:  Urticaria of unknown origin    ED Discharge Orders        Ordered    hydrOXYzine (VISTARIL) 25 MG capsule     02/16/17 1349    EPINEPHrine 0.3 mg/0.3 mL IJ SOAJ injection   Once     02/16/17 1349        Controlled Substance Prescriptions [place] Controlled Substance Registry consulted? Not Applicable   Katy Apo, NP 02/17/17 [phone]

## 2017-02-16 NOTE — Discharge Instructions (Addendum)
You were given a shot of Prednisone (SoluMedrol) today to help with your hives/itching. Recommend start Vistaril (Atarax) 1 to 2 capsules every 8 hours as needed for hives and itching. Recommend apply cool compresses to area as needed for comfort. Recommend call either the [facility] [place] or [facility] office today for an appointment for further evaluation. If swelling of throat, lips or any difficulty breathing occurs, use Epi-pen and call 911. Otherwise, follow-up with the specialists as planned or go to ER if symptoms worsen.

## 2017-02-16 NOTE — ED Triage Notes (Signed)
PT C/O: rash/hives onset last night associated w/wheezing  Seen her eon 01/28/17 for similar sx  Was given prednisone and benadryl w/no relief.   DENIES: fevers  TAKING MEDS: prednisone   A&O x4... NAD... Ambulatory

## 2017-04-18 ENCOUNTER — Encounter (HOSPITAL_COMMUNITY): Payer: Self-pay | Admitting: Emergency Medicine

## 2017-04-18 ENCOUNTER — Emergency Department (HOSPITAL_COMMUNITY)
Admission: EM | Admit: 2017-04-18 | Discharge: 2017-04-18 | Disposition: A | Discharge status: Home / Self Care | Payer: Medicaid Other | Attending: Emergency Medicine | Admitting: Emergency Medicine

## 2017-04-18 ENCOUNTER — Emergency Department (HOSPITAL_COMMUNITY): Payer: Medicaid Other

## 2017-04-18 DIAGNOSIS — J111 Influenza due to unidentified influenza virus with other respiratory manifestations: Secondary | ICD-10-CM | POA: Diagnosis present

## 2017-04-18 DIAGNOSIS — R1011 Right upper quadrant pain: Secondary | ICD-10-CM | POA: Insufficient documentation

## 2017-04-18 DIAGNOSIS — Z79899 Other long term (current) drug therapy: Secondary | ICD-10-CM | POA: Diagnosis not present

## 2017-04-18 DIAGNOSIS — R6889 Other general symptoms and signs: Secondary | ICD-10-CM

## 2017-04-18 DIAGNOSIS — R945 Abnormal results of liver function studies: Secondary | ICD-10-CM | POA: Diagnosis not present

## 2017-04-18 DIAGNOSIS — R7989 Other specified abnormal findings of blood chemistry: Secondary | ICD-10-CM

## 2017-04-18 DIAGNOSIS — R160 Hepatomegaly, not elsewhere classified: Secondary | ICD-10-CM | POA: Diagnosis not present

## 2017-04-18 DIAGNOSIS — F17210 Nicotine dependence, cigarettes, uncomplicated: Secondary | ICD-10-CM | POA: Diagnosis not present

## 2017-04-18 LAB — CBC WITH DIFFERENTIAL/PLATELET
Basophils Absolute: 0 10*3/uL (ref 0.0–0.1)
Basophils Relative: 0 %
Eosinophils Absolute: 0.2 10*3/uL (ref 0.0–0.7)
Eosinophils Relative: 2 %
HCT: 38.3 % (ref 36.0–46.0)
Hemoglobin: 13.4 g/dL (ref 12.0–15.0)
Lymphocytes Relative: 11 %
Lymphs Abs: 1 10*3/uL (ref 0.7–4.0)
MCH: 27.9 pg (ref 26.0–34.0)
MCHC: 35 g/dL (ref 30.0–36.0)
MCV: 79.6 fL (ref 78.0–100.0)
Monocytes Absolute: 0.5 10*3/uL (ref 0.1–1.0)
Monocytes Relative: 6 %
Neutro Abs: 7.7 10*3/uL (ref 1.7–7.7)
Neutrophils Relative %: 81 %
Platelets: 302 10*3/uL (ref 150–400)
RBC: 4.81 MIL/uL (ref 3.87–5.11)
RDW: 13.7 % (ref 11.5–15.5)
WBC: 9.5 10*3/uL (ref 4.0–10.5)

## 2017-04-18 LAB — COMPREHENSIVE METABOLIC PANEL
ALT: 65 U/L — ABNORMAL HIGH (ref 14–54)
AST: 42 U/L — ABNORMAL HIGH (ref 15–41)
Albumin: 3.8 g/dL (ref 3.5–5.0)
Alkaline Phosphatase: 118 U/L (ref 38–126)
Anion gap: 8 (ref 5–15)
BUN: 6 mg/dL (ref 6–20)
CO2: 23 mmol/L (ref 22–32)
Calcium: 9.2 mg/dL (ref 8.9–10.3)
Chloride: 107 mmol/L (ref 101–111)
Creatinine, Ser: 0.56 mg/dL (ref 0.44–1.00)
GFR calc Af Amer: >60 mL/min (ref >60)
GFR calc non Af Amer: >60 mL/min (ref >60)
Glucose, Bld: 105 mg/dL — ABNORMAL HIGH (ref 65–99)
Potassium: 3.8 mmol/L (ref 3.5–5.1)
Sodium: 138 mmol/L (ref 135–145)
Total Bilirubin: 0.5 mg/dL (ref 0.3–1.2)
Total Protein: 7.7 g/dL (ref 6.5–8.1)

## 2017-04-18 LAB — URINALYSIS, ROUTINE W REFLEX MICROSCOPIC
Bacteria, UA: NONE SEEN
Bilirubin Urine: NEGATIVE
Glucose, UA: NEGATIVE mg/dL
Ketones, ur: NEGATIVE mg/dL
Leukocytes, UA: NEGATIVE
Nitrite: NEGATIVE
Protein, ur: NEGATIVE mg/dL
Specific Gravity, Urine: 1.011 (ref 1.005–1.030)
pH: 6 (ref 5.0–8.0)

## 2017-04-18 LAB — LIPASE, BLOOD: Lipase: 18 U/L (ref 11–51)

## 2017-04-18 LAB — POC URINE PREG, ED: Preg Test, Ur: NEGATIVE

## 2017-04-18 LAB — RAPID STREP SCREEN (MED CTR MEBANE ONLY): Streptococcus, Group A Screen (Direct): NEGATIVE

## 2017-04-18 MED ORDER — SODIUM CHLORIDE 0.9 % IV BOLUS
1000.0000 mL | Freq: Once | INTRAVENOUS | Status: AC
  Administered 2017-04-18: 1000 mL via INTRAVENOUS

## 2017-04-18 MED ORDER — IBUPROFEN 800 MG PO TABS
800.0000 mg | ORAL_TABLET | Freq: Once | ORAL | Status: AC
  Administered 2017-04-18: 800 mg via ORAL
  Filled 2017-04-18: qty 1

## 2017-04-18 MED ORDER — PROMETHAZINE HCL 25 MG/ML IJ SOLN
25.0000 mg | Freq: Once | INTRAMUSCULAR | Status: AC
  Administered 2017-04-18: 25 mg via INTRAVENOUS
  Filled 2017-04-18: qty 1

## 2017-04-18 MED ORDER — ONDANSETRON HCL 4 MG/2ML IJ SOLN
4.0000 mg | Freq: Once | INTRAMUSCULAR | Status: AC
  Administered 2017-04-18: 4 mg via INTRAVENOUS
  Filled 2017-04-18: qty 2

## 2017-04-18 MED ORDER — ONDANSETRON 4 MG PO TBDP: 4.0000 mg | ORAL_TABLET | Freq: Three times a day (TID) | ORAL | 0 refills | Status: AC | PRN

## 2017-04-18 NOTE — ED Provider Notes (Signed)
[facility] DEPT Provider Note   CSN: [id number] Arrival date & time: 04/18/17  9735     History   Chief Complaint Chief Complaint  Patient presents with  . Influenza    flu like signs    HPI Misty Murillo is a 27 y.o. female with history of migraines presents today accompanied by mother with chief complaint of gradual onset, progressively worsening flulike symptoms for the last 3 days.  She states that the day before yesterday around the afternoon she began developing generalized myalgias, sore throat, and subjective fevers and chills.  She has not taken her temperature at home.  She notes nasal congestion, sinus pressure, and cough productive of yellow-brown sputum.  She has been taking TheraFlu and NyQuil with relief of her symptoms temporarily.  This morning at around 5 AM she awoke feeling short of breath with a throbbing pain of the thoracic back as well as sharp aching pain of the anterior chest wall with cough only.  She is a non-smoker.  No aggravating or alleviating factors noted.  She does note intermittent generalized abdominal cramping with nausea and 3 episodes of nonbloody nonbilious emesis.  She denies diarrhea, urinary symptoms, vaginal itching, bleeding, or discharge.  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  He has not had a flu shot this year.  No recent travel or surgeries, no prior history of DVT or PE.  She is not taking OCPs. The history is provided by the patient.    Past Medical History:  Diagnosis Date  . Migraines     There are no active problems to display for this patient.   History reviewed. No pertinent surgical history.   OB History    Gravida  3   Para  1   Term  1   Preterm  0   AB  0   Living  1     SAB  0   TAB  0   Ectopic  0   Multiple  0   Live Births  1            Home Medications    Prior to Admission medications   Medication Sig Start Date End  Date Taking? Authorizing Provider  DM-Doxylamine-Acetaminophen (NYQUIL COLD & FLU PO) Take 2 tablets by mouth at bedtime as needed (cold symptoms).   Yes [provider]  EPINEPHrine 0.3 mg/0.3 mL IJ SOAJ injection Inject 0.3 mg into the muscle as needed (allergic reaction).  02/16/17  Yes [provider]  pseudoephedrine-acetaminophen (TYLENOL SINUS) 30-500 MG TABS tablet Take 1 tablet by mouth every 6 (six) hours as needed (cold symptoms).   Yes [provider]  hydrOXYzine (VISTARIL) 25 MG capsule Take 1 to 2 capsules every 8 hours as needed for hives/itching. Patient not taking: Reported on 04/18/2017 02/16/17   Katy Apo, NP  ondansetron (ZOFRAN ODT) 4 MG disintegrating tablet Take 1 tablet (4 mg total) by mouth every 8 (eight) hours as needed for up to 3 days for nausea or vomiting. 04/18/17 04/21/17  Rodell Perna A, PA-C  predniSONE (STERAPRED UNI-PAK 48 TAB) 10 MG (48) TBPK tablet Take as directed. Patient not taking: Reported on 04/18/2017 01/28/17   Vanessa Kick, MD    Family History Family History  Problem Relation Age of Onset  . Diabetes Mother   . Migraines Father   . Cancer Other     Social History Social History   Tobacco Use  .  Smoking status: Current Every Day Smoker  . Smokeless tobacco: Never Used  Substance Use Topics  . Alcohol use: No    Comment: rarely   . Drug use: No     Allergies   Patient has no known allergies.   Review of Systems Review of Systems  Constitutional: Positive for chills and fever.  HENT: Positive for congestion, sinus pressure and sore throat. Negative for drooling and trouble swallowing.   Respiratory: Positive for cough and shortness of breath.   Cardiovascular: Negative for chest pain.  Gastrointestinal: Positive for abdominal pain, nausea and vomiting. Negative for blood in stool and diarrhea.  Genitourinary: Negative for dysuria, hematuria, vaginal bleeding, vaginal discharge and vaginal pain.    Musculoskeletal: Negative for neck stiffness.  Neurological: Negative for syncope, light-headedness and headaches.  All other systems reviewed and are negative.    Physical Exam Updated Vital Signs BP 127/73   Pulse 99   Temp (!) 102.1 F (38.9 C) (Oral)   Resp 18   Ht 5\' 5"  (1.651 m)   Wt 90.7 kg (200 lb)   SpO2 100%   BMI 33.28 kg/m   Physical Exam  Constitutional: She appears well-developed and well-nourished. No distress.  HENT:  Head: Normocephalic and atraumatic.  Right Ear: Tympanic membrane, external ear and ear canal normal.  Left Ear: Tympanic membrane, external ear and ear canal normal.  Nose: Mucosal edema present. No nasal deformity or septal deviation. Right sinus exhibits no maxillary sinus tenderness and no frontal sinus tenderness. Left sinus exhibits no maxillary sinus tenderness and no frontal sinus tenderness.  Mouth/Throat: Uvula is midline and mucous membranes are normal. No trismus in the jaw. Normal dentition. Posterior oropharyngeal erythema present. Tonsils are 3+ on the right. Tonsils are 3+ on the left. No tonsillar exudate.  Tolerating secretions without difficulty.  No submandibular or sublingual abnormalities  Eyes: Conjunctivae are normal. Right eye exhibits no discharge. Left eye exhibits no discharge.  Neck: Normal range of motion and full passive range of motion without pain. Neck supple. No JVD present. No tracheal deviation present.  Cardiovascular: Normal rate, regular rhythm, normal heart sounds and intact distal pulses.  2+ radial and DP/PT pulses bl, negative Homan's bl, no LE edema  Pulmonary/Chest: Effort normal and breath sounds normal. No stridor. No respiratory distress. She has no wheezes. She has no rales. She exhibits tenderness.  Equal rise and fall of chest, no increased work of breathing.  Diffuse anterior chest wall tenderness to palpation with no deformity, crepitus, paradoxical wall motion, or flail segment.  Speaking in full  sentences without difficulty.  Abdominal: Soft. Bowel sounds are normal. She exhibits no distension. There is tenderness. There is no guarding.  Mild generalized tenderness to palpation of the abdomen, worst in the left upper quadrant.  Murphy sign absent, Rovsing's absent, no CVA tenderness  Musculoskeletal: She exhibits no edema.  No midline spine TTP, no paraspinal muscle tenderness, no deformity, crepitus, or step-off noted   Lymphadenopathy:    She has no cervical adenopathy.  Neurological: She is alert.  Skin: Skin is warm and dry. No erythema.  Psychiatric: She has a normal mood and affect. Her behavior is normal.  Nursing note and vitals reviewed.    ED Treatments / Results  Labs (all labs ordered are listed, but only abnormal results are displayed) Labs Reviewed  COMPREHENSIVE METABOLIC PANEL - Abnormal; Notable for the following components:      Result Value   Glucose, Bld 105 (*)  AST 42 (*)    ALT 65 (*)    All other components within normal limits  URINALYSIS, ROUTINE W REFLEX MICROSCOPIC - Abnormal; Notable for the following components:   Hgb urine dipstick SMALL (*)    Squamous Epithelial / LPF 0-5 (*)    All other components within normal limits  RAPID STREP SCREEN (NOT AT [facility])  CULTURE, GROUP A STREP ([place])  CBC WITH DIFFERENTIAL/PLATELET  LIPASE, BLOOD  POC URINE PREG, ED    EKG None  Radiology Dg Chest 2 View  Result Date: 04/18/2017 CLINICAL DATA:  Cough and congestion EXAM: CHEST - 2 VIEW COMPARISON:  February 13, 2016 FINDINGS: The heart size and mediastinal contours are within normal limits. Both lungs are clear. The visualized skeletal structures are unremarkable. IMPRESSION: No active cardiopulmonary disease. Electronically Signed   By: Dorise Bullion III M.D   On: 04/18/2017 07:59   US Abdomen Limited Ruq  Result Date: 04/18/2017 CLINICAL DATA:  Right upper quadrant pain, increased LFTs EXAM: ULTRASOUND ABDOMEN LIMITED RIGHT UPPER QUADRANT  COMPARISON:  None. FINDINGS: Gallbladder: No gallstones or wall thickening visualized. No sonographic Murphy sign noted by sonographer. Common bile duct: Diameter: 3.2 mm Liver: 10.5 x 8.1 x 9.8 cm right hepatic mass of uncertain etiology. Normal hepatic parenchymal echogenicity. Portal vein is patent on color Doppler imaging with normal direction of blood flow towards the liver. IMPRESSION: 10.5 x 8.1 x 9.8 cm right hepatic mass of uncertain etiology. Recommend further evaluation with a nonemergent MRI of the abdomen without and with intravenous contrast. Electronically Signed   By: Kathreen Devoid   On: 04/18/2017 12:50    Procedures Procedures (including critical care time)  Medications Ordered in ED Medications  sodium chloride 0.9 % bolus 1,000 mL (0 mLs Intravenous Stopped 04/18/17 0936)  ondansetron (ZOFRAN) injection 4 mg (4 mg Intravenous Given 04/18/17 0750)  promethazine (PHENERGAN) injection 25 mg (25 mg Intravenous Given 04/18/17 1016)  ibuprofen (ADVIL,MOTRIN) tablet 800 mg (800 mg Oral Given 04/18/17 1416)  sodium chloride 0.9 % bolus 1,000 mL (0 mLs Intravenous Stopped 04/18/17 1551)     Initial Impression / Assessment and Plan / ED Course  I have reviewed the triage vital signs and the nursing notes.  Pertinent labs & imaging results that were available during my care of the patient were reviewed by me and considered in my medical decision making (see chart for details).     Patient presents for evaluation of 3 days of flulike symptoms as well as intermittent abdominal pain, nausea, and vomiting.  She was initially afebrile but eventually became febrile to 102.1 F on reevaluation.  She improved with ibuprofen.  She is uncomfortable but nontoxic in appearance.  She has generalized tenderness to palpation of the abdomen which is worst in the left upper quadrant.  Chest x-ray reviewed by me shows no evidence of consolidation or edema.  No other acute cardiopulmonary abnormalities.  Lab  work reviewed by me shows negative rapid strep screen, lipase and creatinine within normal limits.  She has no leukocytosis or anemia.  UA is not concerning for UTI or nephrolithiasis.  She does have minimally elevated LFTs and so underwent right upper quadrant ultrasound which showed a 10.5 x 8.1 x 9.8 centimeters right hepatic mass of uncertain etiology.  Radiology recommends further evaluation with a nonemergent MRI of the abdomen.  I doubt obstruction, perforation, appendicitis, colitis, TOA, ovarian torsion, ectopic pregnancy, or other acute surgical abdominal pathology.  Low suspicion of hepatitis given symptoms  appear more consistent with influenza.  She is not a candidate for Tamiflu as symptoms have been ongoing for 3 days.  On reevaluation, she is resting comfortably and states she is feeling better.  Serial abdominal examinations are benign.  She is tolerating p.o. fluids without difficulty.  She is ambulatory without difficulty.  Will discharge with Zofran for nausea and instructions to advance diet slowly.  Discussed symptomatic treatment.  Advised patient to avoid Tylenol given her of her mass.  She will follow-up with her primary care physician for reevaluation of her symptoms and for reevaluation of her hepatic mass.  Discussed indications for return to the ED. Pt verbalized understanding of and agreement with plan and is safe for discharge home at this time.   Final Clinical Impressions(s) / ED Diagnoses   Final diagnoses:  LFT elevation  Flu-like symptoms  Mass of right lobe of liver    ED Discharge Orders        Ordered    ondansetron (ZOFRAN ODT) 4 MG disintegrating tablet  Every 8 hours PRN     04/18/17 1521       Renita Papa, PA-C 04/18/17 1616    Ward, Delice Bison, DO 04/18/17 1649

## 2017-04-18 NOTE — Discharge Instructions (Addendum)
Take 600-800 mg of ibuprofen every 6-8 hours for fever and pain.  Drink plenty of fluids and get plenty of rest.  Take Zofran as needed for nausea and vomiting.  Wait around 20 minutes before having anything to eat or drink with this medication.  Advance your diet slowly, eating bland foods that will not upset your stomach.  Drink plenty of fluids and get plenty of rest.  You may use warm water salt gargles, warm teas, and Chloraseptic spray over-the-counter for your sore throat.  You may also use over-the-counter cold medications.  Follow-up with your primary care physician for reevaluation of your symptoms and also for reevaluation of the results of your ultrasound.  I have attached the impression to your paperwork.  The recommendation is for a follow-up MRI.  Return to the emergency department if any concerning signs or symptoms develop such as high fevers even after ibuprofen, persistent nausea and vomiting, or worsening pain.

## 2017-04-21 ENCOUNTER — Encounter (HOSPITAL_COMMUNITY): Payer: Self-pay | Admitting: Emergency Medicine

## 2017-04-21 ENCOUNTER — Ambulatory Visit (HOSPITAL_COMMUNITY)
Admission: EM | Admit: 2017-04-21 | Discharge: 2017-04-21 | Disposition: A | Discharge status: Home / Self Care | Payer: Medicaid Other | Attending: Family Medicine | Admitting: Family Medicine

## 2017-04-21 DIAGNOSIS — B9789 Other viral agents as the cause of diseases classified elsewhere: Secondary | ICD-10-CM

## 2017-04-21 DIAGNOSIS — J069 Acute upper respiratory infection, unspecified: Secondary | ICD-10-CM

## 2017-04-21 DIAGNOSIS — H9202 Otalgia, left ear: Secondary | ICD-10-CM

## 2017-04-21 LAB — CULTURE, GROUP A STREP (THRC)

## 2017-04-21 MED ORDER — IPRATROPIUM BROMIDE 0.06 % NA SOLN: 2.0000 | Freq: Four times a day (QID) | NASAL | 0 refills | Status: DC

## 2017-04-21 MED ORDER — PREDNISONE 20 MG PO TABS: 40.0000 mg | ORAL_TABLET | Freq: Every day | ORAL | 0 refills | Status: AC

## 2017-04-21 MED ORDER — LORATADINE-PSEUDOEPHEDRINE ER 10-240 MG PO TB24: 1.0000 | ORAL_TABLET | Freq: Every day | ORAL | 0 refills | Status: DC

## 2017-04-21 MED ORDER — FLUTICASONE PROPIONATE 50 MCG/ACT NA SUSP: 2.0000 | Freq: Every day | NASAL | 0 refills | Status: DC

## 2017-04-21 MED ORDER — BENZONATATE 100 MG PO CAPS: 100.0000 mg | ORAL_CAPSULE | Freq: Three times a day (TID) | ORAL | 0 refills | Status: DC

## 2017-04-21 MED ORDER — AMOXICILLIN-POT CLAVULANATE 875-125 MG PO TABS: 1.0000 | ORAL_TABLET | Freq: Two times a day (BID) | ORAL | 0 refills | Status: DC

## 2017-04-21 NOTE — ED Provider Notes (Signed)
[place]    CSN: [id number] Arrival date & time: 04/21/17  3154     History   Chief Complaint Chief Complaint  Patient presents with  . URI  . Otalgia    left    HPI Misty Murillo is a 27 y.o. female.   27 year old female comes in for worsening symptoms after being seen at the ED on 3/30 for flu like illness. She was given Zofran for her symptoms. States that better controlled nausea. Symptoms first started 1 week ago, currently include sore throat, congestion, rhinorrhea, productive cough. States with significant left ear pain and burning sensation of the chest when she coughs. Tmax 102 at the ED visit. She was at first taking theraflu, nyquil, mucinex, tylenol with some relief, has not taken these medicines in 3 days since visit at the ED. Current every day smoker.      Past Medical History:  Diagnosis Date  . Migraines     There are no active problems to display for this patient.   History reviewed. No pertinent surgical history.  OB History    Gravida  3   Para  1   Term  1   Preterm  0   AB  0   Living  1     SAB  0   TAB  0   Ectopic  0   Multiple  0   Live Births  1            Home Medications    Prior to Admission medications   Medication Sig Start Date End Date Taking? Authorizing Provider  ondansetron (ZOFRAN ODT) 4 MG disintegrating tablet Take 1 tablet (4 mg total) by mouth every 8 (eight) hours as needed for up to 3 days for nausea or vomiting. 04/18/17 04/21/17 Yes Fawze, Mina A, PA-C  amoxicillin-clavulanate (AUGMENTIN) 875-125 MG tablet Take 1 tablet by mouth every 12 (twelve) hours. Fill on 4/5 if symptoms not improving 04/24/17   Tasia Catchings, Amy V, PA-C  benzonatate (TESSALON) 100 MG capsule Take 1 capsule (100 mg total) by mouth every 8 (eight) hours. 04/21/17   Ok Edwards, PA-C  DM-Doxylamine-Acetaminophen (NYQUIL COLD & FLU PO) Take 2 tablets by mouth at bedtime as needed (cold symptoms).    [provider]    EPINEPHrine 0.3 mg/0.3 mL IJ SOAJ injection Inject 0.3 mg into the muscle as needed (allergic reaction).  02/16/17   [provider]  fluticasone (FLONASE) 50 MCG/ACT nasal spray Place 2 sprays into both nostrils daily. 04/21/17   Tasia Catchings, Amy V, PA-C  ipratropium (ATROVENT) 0.06 % nasal spray Place 2 sprays into both nostrils 4 (four) times daily. 04/21/17   Tasia Catchings, Amy V, PA-C  loratadine-pseudoephedrine (CLARITIN-D 24-HOUR) 10-240 MG 24 hr tablet Take 1 tablet by mouth daily. 04/21/17   Tasia Catchings, Amy V, PA-C  predniSONE (DELTASONE) 20 MG tablet Take 2 tablets (40 mg total) by mouth daily for 4 days. 04/21/17 04/25/17  Ok Edwards, PA-C  pseudoephedrine-acetaminophen (TYLENOL SINUS) 30-500 MG TABS tablet Take 1 tablet by mouth every 6 (six) hours as needed (cold symptoms).    [provider]    Family History Family History  Problem Relation Age of Onset  . Diabetes Mother   . Migraines Father   . Cancer Other     Social History Social History   Tobacco Use  . Smoking status: Current Every Day Smoker  . Smokeless tobacco: Never Used  Substance Use Topics  .  Alcohol use: No    Comment: rarely   . Drug use: No     Allergies   Patient has no known allergies.   Review of Systems Review of Systems  Reason unable to perform ROS: See HPI as above.     Physical Exam Triage Vital Signs ED Triage Vitals  Enc Vitals Group     BP 04/21/17 1015 122/83     Pulse Rate 04/21/17 1015 94     Resp --      Temp 04/21/17 1015 98.5 F (36.9 C)     Temp Source 04/21/17 1015 Oral     SpO2 04/21/17 1015 99 %     Weight --      Height --      Head Circumference --      Peak Flow --      Pain Score 04/21/17 1014 7     Pain Loc --      Pain Edu? --      Excl. in [place]? --    No data found.  Updated Vital Signs BP 122/83 (BP Location: Left Arm)   Pulse 94   Temp 98.5 F (36.9 C) (Oral)   SpO2 99%   Physical Exam  Constitutional: She is oriented to person, place, and time. She appears  well-developed and well-nourished. No distress.  HENT:  Head: Normocephalic and atraumatic.  Right Ear: Tympanic membrane, external ear and ear canal normal. Tympanic membrane is not erythematous and not bulging.  Left Ear: External ear and ear canal normal. Tympanic membrane is not erythematous and not bulging.  Nose: Mucosal edema and rhinorrhea present. Right sinus exhibits maxillary sinus tenderness. Right sinus exhibits no frontal sinus tenderness. Left sinus exhibits maxillary sinus tenderness. Left sinus exhibits no frontal sinus tenderness.  Mouth/Throat: Uvula is midline, oropharynx is clear and moist and mucous membranes are normal.  Left ear TM opaque without erythema, bulging.   Eyes: Pupils are equal, round, and reactive to light. Conjunctivae are normal.  Neck: Normal range of motion. Neck supple.  Cardiovascular: Normal rate, regular rhythm and normal heart sounds. Exam reveals no gallop and no friction rub.  No murmur heard. Pulmonary/Chest: Effort normal and breath sounds normal. She has no decreased breath sounds. She has no wheezes. She has no rhonchi. She has no rales.  Lymphadenopathy:    She has no cervical adenopathy.  Neurological: She is alert and oriented to person, place, and time.  Skin: Skin is warm and dry.  Psychiatric: She has a normal mood and affect. Her behavior is normal. Judgment normal.     UC Treatments / Results  Labs (all labs ordered are listed, but only abnormal results are displayed) Labs Reviewed - No data to display  EKG None Radiology No results found.  Procedures Procedures (including critical care time)  Medications Ordered in UC Medications - No data to display   Initial Impression / Assessment and Plan / UC Course  I have reviewed the triage vital signs and the nursing notes.  Pertinent labs & imaging results that were available during my care of the patient were reviewed by me and considered in my medical decision making  (see chart for details).    Discussed with patient history and exam most consistent with viral URI. Prednisone to help with sinus pressure. Symptomatic treatment as needed. Push fluids. Rx of Augmentin sent to pharmacy, to fill in 3 days if symptoms not improving. Return precautions given. Patient expresses understanding and agrees to plan.  Final Clinical Impressions(s) / UC Diagnoses   Final diagnoses:  Left ear pain  Viral URI with cough    ED Discharge Orders        Ordered    amoxicillin-clavulanate (AUGMENTIN) 875-125 MG tablet  Every 12 hours     04/21/17 1031    fluticasone (FLONASE) 50 MCG/ACT nasal spray  Daily     04/21/17 1031    ipratropium (ATROVENT) 0.06 % nasal spray  4 times daily     04/21/17 1031    loratadine-pseudoephedrine (CLARITIN-D 24-HOUR) 10-240 MG 24 hr tablet  Daily     04/21/17 1031    predniSONE (DELTASONE) 20 MG tablet  Daily     04/21/17 1031    benzonatate (TESSALON) 100 MG capsule  Every 8 hours     04/21/17 1031        Ok Edwards, PA-C 04/21/17 1040

## 2017-04-21 NOTE — ED Triage Notes (Signed)
Pt seen March 30 in ED for flu.  Pt states she is having continued left ear pain and UR congestion.  Pt was only prescribed Zofran in the ED.

## 2017-04-21 NOTE — Discharge Instructions (Signed)
Tessalon for cough. Prednisone for sinus pressure/ear pain. Start flonase, atrovent nasal spray, Claritin-D for nasal congestion/drainage. You can use over the counter nasal saline rinse such as neti pot for nasal congestion. Keep hydrated, your urine should be clear to pale yellow in color. Tylenol/motrin for fever and pain. Monitor for any worsening of symptoms, chest pain, shortness of breath, wheezing, swelling of the throat, follow up for reevaluation.   Can fill Augmentin on 4/5 if symptoms not improving for sinus infection.  For sore throat try using a honey-based tea. Use 3 teaspoons of honey with juice squeezed from half lemon. Place shaved pieces of ginger into 1/2-1 cup of water and warm over stove top. Then mix the ingredients and repeat every 4 hours as needed.

## 2017-04-27 ENCOUNTER — Other Ambulatory Visit: Payer: Self-pay | Admitting: Physician Assistant

## 2017-04-27 ENCOUNTER — Other Ambulatory Visit: Payer: Self-pay

## 2017-04-27 ENCOUNTER — Ambulatory Visit
Admission: RE | Admit: 2017-04-27 | Discharge: 2017-04-27 | Disposition: A | Discharge status: Home / Self Care | Payer: Medicaid Other | Source: Ambulatory Visit | Attending: Physician Assistant | Admitting: Physician Assistant

## 2017-04-27 DIAGNOSIS — R160 Hepatomegaly, not elsewhere classified: Secondary | ICD-10-CM

## 2017-04-27 MED ORDER — GADOBENATE DIMEGLUMINE 529 MG/ML IV SOLN
19.0000 mL | Freq: Once | INTRAVENOUS | Status: AC | PRN
  Administered 2017-04-27: 19 mL via INTRAVENOUS

## 2017-05-04 ENCOUNTER — Encounter (HOSPITAL_COMMUNITY): Payer: Self-pay | Admitting: Emergency Medicine

## 2017-05-04 ENCOUNTER — Emergency Department (HOSPITAL_COMMUNITY)
Admission: EM | Admit: 2017-05-04 | Discharge: 2017-05-04 | Disposition: A | Discharge status: Home / Self Care | Payer: Medicaid Other | Attending: Physician Assistant | Admitting: Physician Assistant

## 2017-05-04 ENCOUNTER — Other Ambulatory Visit: Payer: Self-pay

## 2017-05-04 ENCOUNTER — Emergency Department (HOSPITAL_COMMUNITY): Payer: Medicaid Other

## 2017-05-04 DIAGNOSIS — Z3A00 Weeks of gestation of pregnancy not specified: Secondary | ICD-10-CM | POA: Insufficient documentation

## 2017-05-04 DIAGNOSIS — O200 Threatened abortion: Secondary | ICD-10-CM | POA: Insufficient documentation

## 2017-05-04 DIAGNOSIS — R109 Unspecified abdominal pain: Secondary | ICD-10-CM | POA: Diagnosis present

## 2017-05-04 DIAGNOSIS — R1013 Epigastric pain: Secondary | ICD-10-CM

## 2017-05-04 DIAGNOSIS — Z3A01 Less than 8 weeks gestation of pregnancy: Secondary | ICD-10-CM

## 2017-05-04 DIAGNOSIS — F17210 Nicotine dependence, cigarettes, uncomplicated: Secondary | ICD-10-CM | POA: Diagnosis not present

## 2017-05-04 DIAGNOSIS — O99330 Smoking (tobacco) complicating pregnancy, unspecified trimester: Secondary | ICD-10-CM | POA: Insufficient documentation

## 2017-05-04 DIAGNOSIS — Z79899 Other long term (current) drug therapy: Secondary | ICD-10-CM | POA: Diagnosis not present

## 2017-05-04 LAB — CBC WITH DIFFERENTIAL/PLATELET
BASOS ABS: 0 10*3/uL (ref 0.0–0.1)
Basophils Relative: 0 %
EOS PCT: 1 %
Eosinophils Absolute: 0.1 10*3/uL (ref 0.0–0.7)
HCT: 35.4 % — ABNORMAL LOW (ref 36.0–46.0)
Hemoglobin: 12.4 g/dL (ref 12.0–15.0)
LYMPHS PCT: 30 %
Lymphs Abs: 2.3 10*3/uL (ref 0.7–4.0)
MCH: 27.7 pg (ref 26.0–34.0)
MCHC: 35 g/dL (ref 30.0–36.0)
MCV: 79 fL (ref 78.0–100.0)
MONO ABS: 0.3 10*3/uL (ref 0.1–1.0)
MONOS PCT: 3 %
Neutro Abs: 5.3 10*3/uL (ref 1.7–7.7)
Neutrophils Relative %: 66 %
PLATELETS: 348 10*3/uL (ref 150–400)
RBC: 4.48 MIL/uL (ref 3.87–5.11)
RDW: 14 % (ref 11.5–15.5)
WBC: 7.9 10*3/uL (ref 4.0–10.5)

## 2017-05-04 LAB — COMPREHENSIVE METABOLIC PANEL
ALBUMIN: 3.6 g/dL (ref 3.5–5.0)
ALK PHOS: 99 U/L (ref 38–126)
ALT: 43 U/L (ref 14–54)
AST: 41 U/L (ref 15–41)
Anion gap: 10 (ref 5–15)
BUN: 6 mg/dL (ref 6–20)
CO2: 21 mmol/L — AB (ref 22–32)
Calcium: 8.8 mg/dL — ABNORMAL LOW (ref 8.9–10.3)
Chloride: 106 mmol/L (ref 101–111)
Creatinine, Ser: 0.61 mg/dL (ref 0.44–1.00)
GFR calc Af Amer: >60 mL/min (ref >60)
GFR calc non Af Amer: >60 mL/min (ref >60)
GLUCOSE: 94 mg/dL (ref 65–99)
POTASSIUM: 3.5 mmol/L (ref 3.5–5.1)
Sodium: 137 mmol/L (ref 135–145)
Total Bilirubin: 0.5 mg/dL (ref 0.3–1.2)
Total Protein: 6.9 g/dL (ref 6.5–8.1)

## 2017-05-04 LAB — URINALYSIS, ROUTINE W REFLEX MICROSCOPIC
BILIRUBIN URINE: NEGATIVE
GLUCOSE, UA: NEGATIVE mg/dL
HGB URINE DIPSTICK: NEGATIVE
KETONES UR: NEGATIVE mg/dL
Leukocytes, UA: NEGATIVE
Nitrite: NEGATIVE
PH: 6 (ref 5.0–8.0)
Protein, ur: NEGATIVE mg/dL
Specific Gravity, Urine: 1.011 (ref 1.005–1.030)

## 2017-05-04 LAB — HCG, QUANTITATIVE, PREGNANCY: hCG, Beta Chain, Quant, S: 2517 m[IU]/mL — ABNORMAL HIGH (ref <5)

## 2017-05-04 LAB — PREGNANCY, URINE: Preg Test, Ur: POSITIVE — AB

## 2017-05-04 LAB — LIPASE, BLOOD: Lipase: 20 U/L (ref 11–51)

## 2017-05-04 MED ORDER — PRENATAL COMPLETE 14-0.4 MG PO TABS: ORAL_TABLET | ORAL | 0 refills | Status: DC

## 2017-05-04 MED ORDER — GI COCKTAIL ~~LOC~~
30.0000 mL | Freq: Once | ORAL | Status: AC
  Administered 2017-05-04: 30 mL via ORAL
  Filled 2017-05-04: qty 30

## 2017-05-04 MED ORDER — SODIUM CHLORIDE 0.9 % IV BOLUS
1000.0000 mL | Freq: Once | INTRAVENOUS | Status: AC
  Administered 2017-05-04: 1000 mL via INTRAVENOUS

## 2017-05-04 NOTE — Discharge Instructions (Addendum)
Your pregnancy is very early, around 5 weeks.  But there is no heart rate seen.  This is concerning that this pregnancy may not be a normal pregnancy.  We need you to follow-up within 1 week with an OB/GYN.  Return immediately if you have any abdominal pain, or bleeding. You should call a phone number above, or report to [facility] with complaints.  Even if you do not desire to keep this pregnancy, you should follow-up.

## 2017-05-04 NOTE — ED Provider Notes (Signed)
[place] EMERGENCY DEPARTMENT Provider Note   CSN: [id number] Arrival date & time: 05/04/17  4315     History   Chief Complaint Chief Complaint  Patient presents with  . Flank Pain    HPI Misty Murillo is a 27 y.o. female.  HPI   Patient is a 27 year old female presenting with diffuse abdominal pain.  Patient concerned because she had a the flu a week and half ago at [facility], the labs showed elevated liver enzymes.  Ultrasound showed a possible mass.  She had an outpatient MRI which she said she needed follow-up with a surgeon.  But she was unsure what the MRI actually showed.  She reports that occasionally sitting down for long period of time she is abdominal pain.  It is both in the epigastric area sometimes in her flank sometimes in her lower abdomen.  No vomiting.  After reassurance about the MRI, patient was able to focus on a better history of her present illness.  She reports that it really is epigastric pain.  It was made worse with hot she does yesterday.  And then made worse with the pizza the day before and at burger the day before that.  Patient reports is really right after eating and scant of a burning sensation.  She also has reports that she is worried she may be pregnant and would like a pregnancy test.  Since it is just certain foods that makes her feel sick. Past Medical History:  Diagnosis Date  . Migraines     There are no active problems to display for this patient.   History reviewed. No pertinent surgical history.   OB History    Gravida  3   Para  1   Term  1   Preterm  0   AB  0   Living  1     SAB  0   TAB  0   Ectopic  0   Multiple  0   Live Births  1            Home Medications    Prior to Admission medications   Medication Sig Start Date End Date Taking? Authorizing Provider  amoxicillin-clavulanate (AUGMENTIN) 875-125 MG tablet Take 1 tablet by mouth every 12 (twelve) hours. Fill on 4/5 if  symptoms not improving 04/24/17   Tasia Catchings, Amy V, PA-C  benzonatate (TESSALON) 100 MG capsule Take 1 capsule (100 mg total) by mouth every 8 (eight) hours. 04/21/17   Ok Edwards, PA-C  DM-Doxylamine-Acetaminophen (NYQUIL COLD & FLU PO) Take 2 tablets by mouth at bedtime as needed (cold symptoms).    [provider]  EPINEPHrine 0.3 mg/0.3 mL IJ SOAJ injection Inject 0.3 mg into the muscle as needed (allergic reaction).  02/16/17   [provider]  fluticasone (FLONASE) 50 MCG/ACT nasal spray Place 2 sprays into both nostrils daily. 04/21/17   Tasia Catchings, Amy V, PA-C  ipratropium (ATROVENT) 0.06 % nasal spray Place 2 sprays into both nostrils 4 (four) times daily. 04/21/17   Tasia Catchings, Amy V, PA-C  loratadine-pseudoephedrine (CLARITIN-D 24-HOUR) 10-240 MG 24 hr tablet Take 1 tablet by mouth daily. 04/21/17   Tasia Catchings, Amy V, PA-C  pseudoephedrine-acetaminophen (TYLENOL SINUS) 30-500 MG TABS tablet Take 1 tablet by mouth every 6 (six) hours as needed (cold symptoms).    [provider]    Family History Family History  Problem Relation Age of Onset  . Diabetes Mother   . Migraines Father   .  Cancer Other     Social History Social History   Tobacco Use  . Smoking status: Current Every Day Smoker  . Smokeless tobacco: Never Used  Substance Use Topics  . Alcohol use: No    Comment: rarely   . Drug use: No     Allergies   Patient has no known allergies.   Review of Systems Review of Systems  Constitutional: Negative for activity change.  Respiratory: Negative for shortness of breath.   Cardiovascular: Negative for chest pain.  Gastrointestinal: Positive for nausea. Negative for abdominal pain, diarrhea and vomiting.  All other systems reviewed and are negative.    Physical Exam Updated Vital Signs BP 131/77 (BP Location: Right Arm)   Pulse 76   Temp 98.3 F (36.8 C) (Oral)   Resp 16   LMP 03/30/2017   SpO2 100%   Physical Exam  Constitutional: She is oriented to person, place,  and time. She appears well-developed and well-nourished.  HENT:  Head: Normocephalic and atraumatic.  Eyes: Right eye exhibits no discharge. Left eye exhibits no discharge.  Cardiovascular: Normal rate.  Pulmonary/Chest: Effort normal and breath sounds normal.  Abdominal: Soft. She exhibits no distension. There is no tenderness.  No tenderness to exam  Neurological: She is oriented to person, place, and time.  Skin: Skin is warm and dry. She is not diaphoretic.  Psychiatric: She has a normal mood and affect.  Nursing note and vitals reviewed.    ED Treatments / Results  Labs (all labs ordered are listed, but only abnormal results are displayed) Labs Reviewed  COMPREHENSIVE METABOLIC PANEL  CBC WITH DIFFERENTIAL/PLATELET  LIPASE, BLOOD  URINALYSIS, ROUTINE W REFLEX MICROSCOPIC  POC URINE PREG, ED    EKG None  Radiology No results found.  Procedures Procedures (including critical care time)  Medications Ordered in ED Medications  sodium chloride 0.9 % bolus 1,000 mL (has no administration in time range)  gi cocktail (Maalox,Lidocaine,Donnatal) (has no administration in time range)     Initial Impression / Assessment and Plan / ED Course  I have reviewed the triage vital signs and the nursing notes.  Pertinent labs & imaging results that were available during my care of the patient were reviewed by me and considered in my medical decision making (see chart for details).    Patient is a 27 year old female presenting with diffuse abdominal pain.  Patient concerned because she had a the flu a week and half ago at [facility], the labs showed elevated liver enzymes.  Ultrasound showed a possible mass.  She had an outpatient MRI which she said she needed follow-up with a surgeon.  But she was unsure what the MRI actually showed.  She reports that occasionally sitting down for long period of time she is abdominal pain.  It is both in the epigastric area sometimes in her flank  sometimes in her lower abdomen.  No vomiting.  After reassurance about the MRI, patient was able to focus on a better history of her present illness.  She reports that it really is epigastric pain.  It was made worse with hot she does yesterday.  And then made worse with the pizza the day before and at burger the day before that.  Patient reports is really right after eating and scant of a burning sensation.  She also has reports that she is worried she may be pregnant and would like a pregnancy test.  Since it is just certain foods that makes her feel sick.  9:25 AM Will do labs, urine, urine pregnancy.  Will treat pain as of his gastritis.  I think it is likely gastritis given that it with certain foods and only after eating.  Patient had normal right upper quadrant ultrasound recently.   2:59 PM Patient is pregnant.  This is news to patient.  Ultrasound shows 5-week 5-day pregnancy IUP.  But with no heartbeat.  Concerned that this will be a failed pregnancy.  Instructions given to patient about how worrisome this finding is and how she needs immediate follow-up.  [facility] follow-up given to patient.  Patient unsure whether she went to keep this prior pregnancy, encouraged that she needs follow-up regardless.  Final Clinical Impressions(s) / ED Diagnoses   Final diagnoses:  None    ED Discharge Orders    None       Mackuen, Fredia Sorrow, MD 05/04/17 1500

## 2017-05-04 NOTE — ED Triage Notes (Signed)
Pt is having R flank pain for the last 3-4 days. Pt states pain goes into RLQ as well has RUQ. No n/v/d.

## 2017-05-05 ENCOUNTER — Encounter (HOSPITAL_COMMUNITY): Payer: Self-pay | Admitting: *Deleted

## 2017-05-05 ENCOUNTER — Inpatient Hospital Stay (HOSPITAL_COMMUNITY)
Admission: AD | Admit: 2017-05-05 | Discharge: 2017-05-05 | Disposition: A | Discharge status: Home / Self Care | Payer: Medicaid Other | Source: Ambulatory Visit | Attending: Obstetrics and Gynecology | Admitting: Obstetrics and Gynecology

## 2017-05-05 ENCOUNTER — Other Ambulatory Visit: Payer: Self-pay

## 2017-05-05 DIAGNOSIS — O26891 Other specified pregnancy related conditions, first trimester: Secondary | ICD-10-CM | POA: Insufficient documentation

## 2017-05-05 DIAGNOSIS — O039 Complete or unspecified spontaneous abortion without complication: Secondary | ICD-10-CM | POA: Insufficient documentation

## 2017-05-05 DIAGNOSIS — O99331 Smoking (tobacco) complicating pregnancy, first trimester: Secondary | ICD-10-CM | POA: Diagnosis not present

## 2017-05-05 DIAGNOSIS — F17210 Nicotine dependence, cigarettes, uncomplicated: Secondary | ICD-10-CM | POA: Diagnosis not present

## 2017-05-05 DIAGNOSIS — O200 Threatened abortion: Secondary | ICD-10-CM

## 2017-05-05 DIAGNOSIS — O26899 Other specified pregnancy related conditions, unspecified trimester: Secondary | ICD-10-CM

## 2017-05-05 DIAGNOSIS — O3680X0 Pregnancy with inconclusive fetal viability, not applicable or unspecified: Secondary | ICD-10-CM

## 2017-05-05 DIAGNOSIS — R109 Unspecified abdominal pain: Secondary | ICD-10-CM | POA: Diagnosis present

## 2017-05-05 NOTE — MAU Note (Signed)
Has liver masses.  Went to ER yesterday for severe abd pain, thought it was due to that.  Found out she was preg.  Today, is having cramping more like menstrual cramps.  No bleeding.

## 2017-05-05 NOTE — Discharge Instructions (Signed)
-  Keep appt at [facility] for Lab Draw at 1:30 pm on Wednesday, April 17   Miscarriage A miscarriage is the loss of an unborn baby (fetus) before the 20th week of pregnancy. The cause is often unknown. Follow these instructions at home:  You may need to stay in bed (bed rest), or you may be able to do light activity. Go about activity as told by your doctor.  Have help at home.  Write down how many pads you use each day. Write down how soaked they are.  Do not use tampons. Do not wash out your vagina (douche) or have sex (intercourse) until your doctor approves.  Only take medicine as told by your doctor.  Do not take aspirin.  Keep all doctor visits as told.  If you or your partner have problems with grieving, talk to your doctor. You can also try counseling. Give yourself time to grieve before trying to get pregnant again. Get help right away if:  You have bad cramps or pain in your back or belly (abdomen).  You have a fever.  You pass large clumps of blood (clots) from your vagina that are walnut-sized or larger. Save the clumps for your doctor to see.  You pass large amounts of tissue from your vagina. Save the tissue for your doctor to see.  You have more bleeding.  You have thick, bad-smelling fluid (discharge) coming from the vagina.  You get lightheaded, weak, or you pass out (faint).  You have chills. This information is not intended to replace advice given to you by your health care provider. Make sure you discuss any questions you have with your health care provider. Document Released: 03/31/2011 Document Revised: 06/14/2015 Document Reviewed: 02/06/2011 Elsevier Interactive Patient Education  2017 [employer].

## 2017-05-05 NOTE — MAU Provider Note (Addendum)
Patient Misty Murillo is a 27 y.o. G4P1001 At 5 weeks and 5 days here with complaints of abdominal pain. Patient was seen yesterday in the [place] and a SIUP without cardiac activity was discovered. Her bhcg was 2500.   Patient denied vaginal bleeding yesterday.  Patient was told to follow-up with ob-gyn provider.    History     CSN: [id number]  Arrival date and time: 05/05/17 1732   First Provider Initiated Contact with Patient 05/05/17 1807      Chief Complaint  Patient presents with  . Abdominal Pain   Abdominal Pain  This is a new problem. The current episode started yesterday. The onset quality is sudden. The problem occurs intermittently. The pain is located in the generalized abdominal region. The pain is at a severity of 3/10. The quality of the pain is cramping. The abdominal pain radiates to the back. Pertinent negatives include no constipation, diarrhea, dysuria, nausea or vomiting.   Patient states that the pain radiates to her back. The pain "comes and goes" for a few seconds and "feels like menstrual cramps".   OB History    Gravida  4   Para  1   Term  1   Preterm  0   AB  0   Living  1     SAB  0   TAB  0   Ectopic  0   Multiple  0   Live Births  1           Past Medical History:  Diagnosis Date  . Migraines     No past surgical history on file.  Family History  Problem Relation Age of Onset  . Diabetes Mother   . Migraines Father   . Cancer Other     Social History   Tobacco Use  . Smoking status: Current Every Day Smoker  . Smokeless tobacco: Never Used  Substance Use Topics  . Alcohol use: No    Comment: rarely   . Drug use: No    Allergies: No Known Allergies  Medications Prior to Admission  Medication Sig Dispense Refill Last Dose  . amoxicillin-clavulanate (AUGMENTIN) 875-125 MG tablet Take 1 tablet by mouth every 12 (twelve) hours. Fill on 4/5 if symptoms not improving 14 tablet 0   . benzonatate  (TESSALON) 100 MG capsule Take 1 capsule (100 mg total) by mouth every 8 (eight) hours. 21 capsule 0   . DM-Doxylamine-Acetaminophen (NYQUIL COLD & FLU PO) Take 2 tablets by mouth at bedtime as needed (cold symptoms).   04/17/2017 at Unknown time  . EPINEPHrine 0.3 mg/0.3 mL IJ SOAJ injection Inject 0.3 mg into the muscle as needed (allergic reaction).   1 prn at unk  . fluticasone (FLONASE) 50 MCG/ACT nasal spray Place 2 sprays into both nostrils daily. 1 g 0   . ipratropium (ATROVENT) 0.06 % nasal spray Place 2 sprays into both nostrils 4 (four) times daily. 15 mL 0   . loratadine-pseudoephedrine (CLARITIN-D 24-HOUR) 10-240 MG 24 hr tablet Take 1 tablet by mouth daily. 15 tablet 0 05/02/2017  . Prenatal Vit-Fe Fumarate-FA (PRENATAL COMPLETE) 14-0.4 MG TABS Take daily 60 each 0   . pseudoephedrine-acetaminophen (TYLENOL SINUS) 30-500 MG TABS tablet Take 1 tablet by mouth every 6 (six) hours as needed (cold symptoms).   04/18/2017 at Unknown time    Review of Systems  Constitutional: Negative.   HENT: Negative.   Respiratory: Negative.   Cardiovascular: Negative.   Gastrointestinal: Positive for  abdominal pain. Negative for constipation, diarrhea, nausea and vomiting.  Genitourinary: Negative for dysuria, vaginal bleeding and vaginal discharge.  Neurological: Negative.   Hematological: Negative.   Psychiatric/Behavioral: Negative.    Physical Exam   Blood pressure 124/66, pulse 80, temperature 99 F (37.2 C), temperature source Oral, resp. rate 16, height 5\' 5"  (1.651 m), weight 236 lb 4 oz (107.2 kg), last menstrual period 03/30/2017, SpO2 100 %.  Physical Exam  Constitutional: She is oriented to person, place, and time. She appears well-developed.  HENT:  Head: Normocephalic.  Neck: Normal range of motion.  Respiratory: Effort normal.  GI: Soft.  Musculoskeletal: Normal range of motion.  Neurological: She is alert and oriented to person, place, and time.  Skin: Skin is warm and dry.     MAU Course  Procedures  MDM -Discussed with patient the details of her [place] from yesterday (possible miscarriage and presence of fibroid and cyst, all of which could be cause for abdominal pain).   Patient very anxious about pregnancy; will repeat quant tomorrow (non-stat).    Assessment and Plan   1. Abdominal pain affecting pregnancy   2. Threatened miscarriage in early pregnancy    2. Patient stable for discharge with plan for non-stat bhcg tomorrow at 1:30 pm.   3. Outpatient viability [place] ordered for next Friday.  4. Bleeding and return precautions reviewed.   Mervyn Skeeters Kambre Messner 05/05/2017, 6:16 PM

## 2017-05-06 ENCOUNTER — Ambulatory Visit: Payer: Medicaid Other | Admitting: General Practice

## 2017-05-06 DIAGNOSIS — O200 Threatened abortion: Secondary | ICD-10-CM

## 2017-05-06 NOTE — Progress Notes (Signed)
Scheduled patient ultrasound for 4/26 @ 815 per [name]. bhcg not stat today per Curt Bears.

## 2017-05-07 ENCOUNTER — Other Ambulatory Visit: Payer: Self-pay

## 2017-05-07 ENCOUNTER — Telehealth: Payer: Self-pay | Admitting: Student

## 2017-05-07 ENCOUNTER — Inpatient Hospital Stay (HOSPITAL_COMMUNITY): Payer: Medicaid Other

## 2017-05-07 ENCOUNTER — Encounter (HOSPITAL_COMMUNITY): Payer: Self-pay | Admitting: *Deleted

## 2017-05-07 ENCOUNTER — Inpatient Hospital Stay (HOSPITAL_COMMUNITY)
Admission: AD | Admit: 2017-05-07 | Discharge: 2017-05-07 | Disposition: A | Discharge status: Home / Self Care | Payer: Medicaid Other | Source: Ambulatory Visit | Attending: Family Medicine | Admitting: Family Medicine

## 2017-05-07 DIAGNOSIS — O3481 Maternal care for other abnormalities of pelvic organs, first trimester: Secondary | ICD-10-CM | POA: Diagnosis not present

## 2017-05-07 DIAGNOSIS — O99331 Smoking (tobacco) complicating pregnancy, first trimester: Secondary | ICD-10-CM | POA: Diagnosis not present

## 2017-05-07 DIAGNOSIS — Z3A01 Less than 8 weeks gestation of pregnancy: Secondary | ICD-10-CM | POA: Insufficient documentation

## 2017-05-07 DIAGNOSIS — D259 Leiomyoma of uterus, unspecified: Secondary | ICD-10-CM | POA: Diagnosis not present

## 2017-05-07 DIAGNOSIS — R1031 Right lower quadrant pain: Secondary | ICD-10-CM | POA: Insufficient documentation

## 2017-05-07 DIAGNOSIS — N83201 Unspecified ovarian cyst, right side: Secondary | ICD-10-CM | POA: Diagnosis not present

## 2017-05-07 DIAGNOSIS — O26891 Other specified pregnancy related conditions, first trimester: Secondary | ICD-10-CM | POA: Diagnosis present

## 2017-05-07 DIAGNOSIS — R109 Unspecified abdominal pain: Secondary | ICD-10-CM

## 2017-05-07 DIAGNOSIS — O26899 Other specified pregnancy related conditions, unspecified trimester: Secondary | ICD-10-CM

## 2017-05-07 DIAGNOSIS — N83209 Unspecified ovarian cyst, unspecified side: Secondary | ICD-10-CM

## 2017-05-07 LAB — HCG, QUANTITATIVE, PREGNANCY: HCG, BETA CHAIN, QUANT, S: 5598 m[IU]/mL — AB (ref <5)

## 2017-05-07 LAB — BETA HCG QUANT (REF LAB): hCG Quant: 3591 m[IU]/mL

## 2017-05-07 NOTE — MAU Note (Signed)
Liver mass found about a month ago after having the flu. Was seen at [facility] ED on Monday for abdominal pain, abdominal swelling, found out pregnant, had GI cocktail Was seen at MAU on Tuesday - told she has ovarian cyst and fibroid.   Today began having intermittent severe RLQ pain at work.  Pain begins in RUQ, skips the middle, goes into RLQ,then around hip and up right back.

## 2017-05-07 NOTE — MAU Provider Note (Signed)
History     CSN: [id number]  Arrival date and time: 05/07/17 1228   First Provider Initiated Contact with Patient 05/07/17 1304      Chief Complaint  Patient presents with  . Abdominal Pain    RLQ   Misty Murillo is a 27 y.o. G3P1001 at [redacted]w[redacted]d by [place] 3 days ago presenting via EMS with right lower quadrant abdominal pain.  She describes the pain as menstrual-like cramping located right groin radiating to right lower back.  The pain is intermittent and waxes and wanes. It is similar to the pain she had 05/04/2017 when she was seen at [facility] ED. Her ultrasound that day showed IUP 5-week 5-day with gestational sac, yolk sac and fetal pole with no fetal heart beat. Ultrasound also showed right ovarian cyst with clot and fibroid.   No home treatment for the pain.  Last bowel movement 2 days ago.  No vomiting but slight nausea today, now hungry.  Appetite is normal.  No fever. No vaginal bleeding.  Quantitative beta-hCG was  2500 on 05/04/2017. She was next seen here 05/05/2017 with continued abdominal pain and on 05/06/2017 had a follow-up quant which was 3590.  Of note, she also has intermittent right upper quadrant sharp pains.  She was diagnosed with a liver mass by MRI on 04/27/2017. She had flu symptoms and slightly elevated LFTs at that time.  On 05/04/2017 LFTs were normal.  She is awaiting surgical consult.    OB History  Gravida Para Term Preterm AB Living  3 1 1  0 0 1  SAB TAB Ectopic Multiple Live Births  0 0 0 0 1    # Outcome Date GA Lbr Len/2nd Weight Sex Delivery Anes PTL Lv  3 Current           2 Term 02/02/10    M Vag-Spont EPI  LIV  1 Gravida              Past Medical History:  Diagnosis Date  . Migraines     History reviewed. No pertinent surgical history.  Family History  Problem Relation Age of Onset  . Diabetes Mother   . Migraines Father   . Cancer Other     Social History   Tobacco Use  . Smoking status: Current Every Day Smoker  . Smokeless tobacco: Never  Used  Substance Use Topics  . Alcohol use: No    Comment: rarely   . Drug use: No    Allergies: No Known Allergies  Medications Prior to Admission  Medication Sig Dispense Refill Last Dose  . amoxicillin-clavulanate (AUGMENTIN) 875-125 MG tablet Take 1 tablet by mouth every 12 (twelve) hours. Fill on 4/5 if symptoms not improving 14 tablet 0   . benzonatate (TESSALON) 100 MG capsule Take 1 capsule (100 mg total) by mouth every 8 (eight) hours. 21 capsule 0   . EPINEPHrine 0.3 mg/0.3 mL IJ SOAJ injection Inject 0.3 mg into the muscle as needed (allergic reaction).   1 prn at unk  . fluticasone (FLONASE) 50 MCG/ACT nasal spray Place 2 sprays into both nostrils daily. 1 g 0   . Prenatal Vit-Fe Fumarate-FA (PRENATAL COMPLETE) 14-0.4 MG TABS Take daily 60 each 0     Review of Systems  Constitutional: Negative for fever.  Gastrointestinal: Positive for abdominal distention, abdominal pain and nausea. Negative for constipation, diarrhea and vomiting.       Feels bloated and swollen  Genitourinary: Positive for flank pain and pelvic pain. Negative  for hematuria, vaginal bleeding and vaginal discharge.  Skin: Negative for color change.  Neurological: Negative for dizziness.  Psychiatric/Behavioral: The patient is nervous/anxious.    Physical Exam   Blood pressure 119/68, pulse 68, temperature 98.8 F (37.1 C), temperature source Oral, resp. rate 18, height 5\' 5"  (1.651 m), last menstrual period 03/30/2017, SpO2 97 %.  Physical Exam  Nursing note and vitals reviewed. Constitutional: She is oriented to person, place, and time. She appears well-developed and well-nourished.  HENT:  Head: Normocephalic.  Eyes: No scleral icterus.  Neck: Neck supple. No thyromegaly present.  Cardiovascular: Normal rate.  Respiratory: Effort normal.  GI: Soft. There is tenderness. There is no rebound and no guarding.  Mild TTP right costal margin and suprapubic midline area  Musculoskeletal: Normal range  of motion.  Neurological: She is alert and oriented to person, place, and time.  Skin: Skin is warm and dry.  Psychiatric: She has a normal mood and affect.    MAU Course  Procedures Results for orders placed or performed during the hospital encounter of 05/07/17 (from the past 24 hour(s))  hCG, quantitative, pregnancy     Status: Abnormal   Collection Time: 05/07/17  1:23 PM  Result Value Ref Range   hCG, Beta Chain, Quant, S 5,598 (H) <5 mIU/mL  US Ob Transvaginal  Result Date: 05/07/2017 CLINICAL DATA:  Assess viability EXAM: TRANSVAGINAL OB ULTRASOUND TECHNIQUE: Transvaginal ultrasound was performed for complete evaluation of the gestation as well as the maternal uterus, adnexal regions, and pelvic cul-de-sac. COMPARISON:  05/04/2017 FINDINGS: Intrauterine gestational sac: Present, single Yolk sac:  Present Embryo:  Present Cardiac Activity: Absent Heart Rate: N/A bpm CRL:   2.6 mm   5 w 5 d                  [place] EDC: 01/02/2018 Subchorionic hemorrhage:  None visualized. Maternal uterus/adnexae: LEFT ovary normal size and morphology 3.2 x 1.9 x 2.4 cm. RIGHT ovary measures 4.6 x 3.0 x 2.8 cm and contains a corpus luteal cyst. Exophytic leiomyoma arising from posterior aspect of uterus to the LEFT 3.5 x 3.1 x 3.0 cm. No free pelvic fluid or adnexal masses. IMPRESSION: Gestational sac containing a yolk sac and fetal pole is identified within the uterus. No fetal cardiac activity is detected. Findings are suspicious but not yet definitive for failed pregnancy. Recommend follow-up US in 10-14 days for definitive diagnosis. This recommendation follows SRU consensus guidelines: Diagnostic Criteria for Nonviable Pregnancy Early in the First Trimester. [employer] Med 2013; 258:5277-82. Electronically Signed   By: Lavonia Dana M.D.   On: 05/07/2017 15:49    MDM Discussed US findings and impression with patient and mother. All questions answered and they understand suspicious but not yet definitive for EPF.   Advised to keep her appointment for ultrasound on 05/16/2014 and she is given precautions to return for heavy bleeding or severe abdominal pain.  Assessment and Plan  G3P1011 at 6 wks, IUP viability not determined 1. Abdominal pain complicating pregnancy   2. Uterine leiomyoma, unspecified location   3. Ovarian cyst during pregnancy in first trimester    Allergies as of 05/07/2017   No Known Allergies     Medication List    STOP taking these medications   amoxicillin-clavulanate 875-125 MG tablet Commonly known as:  AUGMENTIN   benzonatate 100 MG capsule Commonly known as:  TESSALON   fluticasone 50 MCG/ACT nasal spray Commonly known as:  FLONASE   PRENATAL COMPLETE 14-0.4  MG Tabs     TAKE these medications   EPINEPHrine 0.3 mg/0.3 mL Soaj injection Commonly known as:  EPI-PEN Inject 0.3 mg into the muscle as needed (allergic reaction).      Follow-up [facility] for [place] Follow up on 05/15/2017.   Specialty:  Obstetrics and Gynecology Why:  Keep your ultrasound appointment Contact information: [address] [address] [place] 05/07/2017, 1:11 PM

## 2017-05-07 NOTE — Telephone Encounter (Signed)
Left message for Misty Murillo stating that her pregnancy hormone had increased but without an [place] we can't say for sure if the pregnancy is healthy or not. Informed her the rise in pregnancy hormone was a little less than we would expect, and that may or may not be concerning. If she has any increase in pain or she starts bleeding, she should come to MAU. Reminded her of the date and time of her [place] next week and asked her to call me back at the clinic today if she has any questions.  [name]

## 2017-05-07 NOTE — Discharge Instructions (Signed)

## 2017-05-15 ENCOUNTER — Ambulatory Visit (HOSPITAL_COMMUNITY)
Admission: RE | Admit: 2017-05-15 | Discharge: 2017-05-15 | Disposition: A | Discharge status: Home / Self Care | Payer: Medicaid Other | Source: Ambulatory Visit | Attending: Student | Admitting: Student

## 2017-05-15 ENCOUNTER — Ambulatory Visit (INDEPENDENT_AMBULATORY_CARE_PROVIDER_SITE_OTHER): Payer: Medicaid Other | Admitting: *Deleted

## 2017-05-15 DIAGNOSIS — Z712 Person consulting for explanation of examination or test findings: Secondary | ICD-10-CM

## 2017-05-15 DIAGNOSIS — O26899 Other specified pregnancy related conditions, unspecified trimester: Secondary | ICD-10-CM | POA: Diagnosis not present

## 2017-05-15 DIAGNOSIS — R109 Unspecified abdominal pain: Secondary | ICD-10-CM | POA: Insufficient documentation

## 2017-05-15 DIAGNOSIS — O3680X0 Pregnancy with inconclusive fetal viability, not applicable or unspecified: Secondary | ICD-10-CM | POA: Diagnosis not present

## 2017-05-15 DIAGNOSIS — Z369 Encounter for antenatal screening, unspecified: Secondary | ICD-10-CM | POA: Diagnosis present

## 2017-05-15 NOTE — Progress Notes (Addendum)
[place] results reviewed by Earlie Server, NP. Pt informed of normal IUP and FHR 124 bpm. Pt has New Ob appt scheduled on 4/29 @ 1355. Pt voiced understanding of all information given.    I have reviewed the note and agree with the documentation and plan of care.  Earlie Server, RN, MSN, NP-BC Nurse Practitioner, [facility] for [employer], [place] [facility] 05/15/2017 9:56 AM

## 2017-05-18 ENCOUNTER — Other Ambulatory Visit: Payer: Self-pay

## 2017-05-18 ENCOUNTER — Encounter: Payer: Self-pay | Admitting: Obstetrics and Gynecology

## 2017-05-18 ENCOUNTER — Ambulatory Visit (INDEPENDENT_AMBULATORY_CARE_PROVIDER_SITE_OTHER): Payer: Medicaid Other | Admitting: Obstetrics and Gynecology

## 2017-05-18 ENCOUNTER — Other Ambulatory Visit (HOSPITAL_COMMUNITY)
Admission: RE | Admit: 2017-05-18 | Discharge: 2017-05-18 | Disposition: A | Discharge status: Home / Self Care | Payer: Medicaid Other | Source: Ambulatory Visit | Attending: Obstetrics and Gynecology | Admitting: Obstetrics and Gynecology

## 2017-05-18 VITALS — BP 128/68 | HR 75 | Wt 234.0 lb

## 2017-05-18 DIAGNOSIS — Z3A00 Weeks of gestation of pregnancy not specified: Secondary | ICD-10-CM | POA: Insufficient documentation

## 2017-05-18 DIAGNOSIS — O0991 Supervision of high risk pregnancy, unspecified, first trimester: Secondary | ICD-10-CM

## 2017-05-18 DIAGNOSIS — Z87891 Personal history of nicotine dependence: Secondary | ICD-10-CM | POA: Insufficient documentation

## 2017-05-18 DIAGNOSIS — O0990 Supervision of high risk pregnancy, unspecified, unspecified trimester: Secondary | ICD-10-CM | POA: Diagnosis present

## 2017-05-18 DIAGNOSIS — R160 Hepatomegaly, not elsewhere classified: Secondary | ICD-10-CM

## 2017-05-18 LAB — POCT URINALYSIS DIP (DEVICE)
BILIRUBIN URINE: NEGATIVE
GLUCOSE, UA: NEGATIVE mg/dL
Hgb urine dipstick: NEGATIVE
KETONES UR: NEGATIVE mg/dL
LEUKOCYTES UA: NEGATIVE
Nitrite: NEGATIVE
PROTEIN: NEGATIVE mg/dL
SPECIFIC GRAVITY, URINE: 1.015 (ref 1.005–1.030)
Urobilinogen, UA: 0.2 mg/dL (ref 0.0–1.0)
pH: 5.5 (ref 5.0–8.0)

## 2017-05-18 NOTE — Progress Notes (Signed)
INITIAL PRENATAL VISIT NOTE  Subjective:  Misty Murillo is a 27 y.o. G3P1001 at [redacted]w[redacted]d by early [place] being seen today for her initial prenatal visit. This is a planned pregnancy. She and partner are planning to keep pregnancy. She was using nothing for birth control previously, has never used anything for contraception. She has an obstetric history significant for 1 x SVD. She has a medical history significant for migraine.  Patient reports occasional nausea, some cramping.   . Vag. Bleeding: None.   . Denies leaking of fluid.    Past Medical History:  Diagnosis Date  . Liver mass   . Migraines   . Preterm labor    preterm contractions    History reviewed. No pertinent surgical history.  OB History  Gravida Para Term Preterm AB Living  2 1 1  0 0 1  SAB TAB Ectopic Multiple Live Births  0 0 0 0 1    # Outcome Date GA Lbr Len/2nd Weight Sex Delivery Anes PTL Lv  2 Current           1 Term 02/02/10    M Vag-Spont EPI  LIV    Social History   Socioeconomic History  . Marital status: Single    Spouse name: Not on file  . Number of children: Not on file  . Years of education: Not on file  . Highest education level: Not on file  Occupational History  . Not on file  Social Needs  . Financial resource strain: Not on file  . Food insecurity:    Worry: Not on file    Inability: Not on file  . Transportation needs:    Medical: Not on file    Non-medical: Not on file  Tobacco Use  . Smoking status: Former Smoker    Types: Cigarettes    Last attempt to quit: 05/04/2017    Years since quitting: 0.0  . Smokeless tobacco: Never Used  Substance and Sexual Activity  . Alcohol use: No    Comment: rarely   . Drug use: No  . Sexual activity: Not Currently    Birth control/protection: None  Lifestyle  . Physical activity:    Days per week: Not on file    Minutes per session: Not on file  . Stress: Not on file  Relationships  . Social connections:    Talks on phone: Not on  file    Gets together: Not on file    Attends religious service: Not on file    Active member of club or organization: Not on file    Attends meetings of clubs or organizations: Not on file    Relationship status: Not on file  Other Topics Concern  . Not on file  Social History Narrative  . Not on file    Family History  Problem Relation Age of Onset  . Diabetes Mother   . Migraines Father   . Cancer Other      (Not in a hospital admission)  No Known Allergies  Review of Systems: Negative except for what is mentioned in HPI.  Objective:   Vitals:   05/18/17 1447  BP: 128/68  Pulse: 75  Weight: 234 lb (106.1 kg)    Fetal Status:         no FHT heard on doppler, however had [place] with cardiac activity 05/15/17  Physical Exam: BP 128/68   Pulse 75   Wt 234 lb (106.1 kg)   LMP 03/30/2017  BMI 38.94 kg/m  CONSTITUTIONAL: Well-developed, well-nourished female in no acute distress.  NEUROLOGIC: Alert and oriented to person, place, and time. Normal reflexes, muscle tone coordination. No cranial nerve deficit noted. PSYCHIATRIC: Normal mood and affect. Normal behavior. Normal judgment and thought content. SKIN: Skin is warm and dry. No rash noted. Not diaphoretic. No erythema. No pallor. HENT:  Normocephalic, atraumatic, External right and left ear normal. Oropharynx is clear and moist EYES: Conjunctivae and EOM are n ormal. Pupils are equal, round, and reactive to light. No scleral icterus.  NECK: Normal range of motion, supple, no masses CARDIOVASCULAR: Normal heart rate noted, regular rhythm RESPIRATORY: Effort normal, no problems with respiration noted BREASTS: symmetric, non-tender, no masses palpable ABDOMEN: Soft, mildly tender to palpation in epigastric area, nondistended. GU: normal appearing external female genitalia, nulliparous normal appearing cervix, scant white discharge in vagina, no lesions noted Bimanual: 8 weeks sized uterus, no adnexal tenderness or  palpable lesions noted MUSCULOSKELETAL: Normal range of motion. EXT:  No edema and no tenderness. 2+ distal pulses.   Assessment and Plan:  Pregnancy: G3P1001 at [redacted]w[redacted]d by early [place]  1. Supervision of high risk pregnancy, antepartum -undecideed about genetic testing - Culture, OB Urine - Cystic fibrosis gene test - Cytology - PAP - Hemoglobinopathy Evaluation - Obstetric Panel, Including HIV - SMN1 COPY NUMBER ANALYSIS (SMA Carrier Screen)  2. Liver mass - 7x10 cm in left lobe, favor benign adenoma - has been evaluated by general surgery, who do not want to operate at this time, but re-eval mass in 6-8 weeks with MRI - to have consult with MFM  Preterm labor symptoms and general obstetric precautions including but not limited to vaginal bleeding, contractions, leaking of fluid and fetal movement were reviewed in detail with the patient.  Please refer to After Visit Summary for other counseling recommendations.   Return in about 1 month (around 06/15/2017) for OB visit (MD).  Sloan Leiter 05/18/2017 3:18 PM

## 2017-05-18 NOTE — Patient Instructions (Signed)
The tests for genetic testing offered are nuchal translucency or Panorama. You may look up information

## 2017-05-19 LAB — GC/CHLAMYDIA PROBE AMP (~~LOC~~) NOT AT ARMC
CHLAMYDIA, DNA PROBE: NEGATIVE
NEISSERIA GONORRHEA: NEGATIVE

## 2017-05-20 ENCOUNTER — Encounter: Payer: Self-pay | Admitting: Obstetrics and Gynecology

## 2017-05-21 ENCOUNTER — Encounter: Payer: Self-pay | Admitting: *Deleted

## 2017-05-21 LAB — CULTURE, OB URINE

## 2017-05-21 LAB — URINE CULTURE, OB REFLEX

## 2017-05-22 ENCOUNTER — Encounter (HOSPITAL_COMMUNITY): Payer: Self-pay

## 2017-05-22 ENCOUNTER — Ambulatory Visit (HOSPITAL_COMMUNITY)
Admission: RE | Admit: 2017-05-22 | Discharge: 2017-05-22 | Disposition: A | Discharge status: Home / Self Care | Payer: Medicaid Other | Source: Ambulatory Visit | Attending: Student | Admitting: Student

## 2017-05-22 DIAGNOSIS — O0991 Supervision of high risk pregnancy, unspecified, first trimester: Secondary | ICD-10-CM

## 2017-05-22 DIAGNOSIS — Z3169 Encounter for other general counseling and advice on procreation: Secondary | ICD-10-CM | POA: Diagnosis present

## 2017-05-22 DIAGNOSIS — Z6836 Body mass index (BMI) 36.0-36.9, adult: Secondary | ICD-10-CM | POA: Insufficient documentation

## 2017-05-22 DIAGNOSIS — R160 Hepatomegaly, not elsewhere classified: Secondary | ICD-10-CM | POA: Diagnosis not present

## 2017-05-22 DIAGNOSIS — Z87891 Personal history of nicotine dependence: Secondary | ICD-10-CM

## 2017-05-22 DIAGNOSIS — O99211 Obesity complicating pregnancy, first trimester: Secondary | ICD-10-CM | POA: Insufficient documentation

## 2017-05-22 NOTE — Progress Notes (Signed)
MFM consult, Staff Note:  I spoke to Misty Murillo  in regard to the liver mass that is causing her symptoms of abdominal discomfort.  This mass is thought to be a hemangioma and if general surgery feels it needs removal, the ideal time would be in the second trimester, specifically 14-20 weeks of gestation to generate the lowest risk for maternal complication of bleeding, preterm labor, and pregnancy loss.  I will copy her surgical consultant at [facility] for clearance by obstetrics for removal during that time should they feel it should be removed with recommendation for timing 14-20 weeks of pregnancy.    In the interim, I would recommend monthly LFT/CMP, coagulation profile, and CBC to ensure she is stable from a laboratory standpoint in addition to no acute deterioration (not expected) in maternal status.  If the liver mass removal is not pursued, I would continue to recommend that surveillance throughout the pregnancy along with additional follow up by GI medicine to manage symptoms in association with the mass as described by the patient to involve expertise in the management of this condition other than MFM or obstetrics.  I then shifted my discussion to her morbid obesity in pregnancy as defined by her BMI of 36.6.  I discussed risks and management strategies that are germane to obesity in pregnancy. Risk to pregnancy increases as the age and BMI of the patient increases.  Risks inherent to such pregnancies include, but are not limited to: miscarriage, intrauterine fetal demise, gestational diabetes, preeclampsia, and gestational hypertension.   Currently, I recommend that >20 weeks the patient should (in concert with prenatal visits) maintain close surveillance for symptoms to facilitate early detection of preeclampsia is necessary and closely monitor maternal blood pressure and renal function.  Due to increased risk for preeclampsia,  I recommend an early 24 hour urine collection for  baseline renal function.  Renal function will be assessed again if there is uncontrolled HTN or symptoms/signs of preeclampsia.    Because there is increased risk for gestational diabetes as well as occult or previously undiagnosed pregestational diabetes, I explained to her that we recommend a screening HgbA1c to assess background insulin resistance prior to exposing her to a 1 hour GTT.  Provided HgbA1c is less than 6.4%, I recommend early 1 hour GTT to screen for evidence of diabetes.  There is a potential for inadequate ultrasound screening for fetal anomalies and fetal well-being due to the obesity thus making it difficult to diagnose fetal abnormalities and or monitor the pregnancy.Interval growth assessments should be arranged by way of formal fetal biometry on ultrasound.    Also, there is an increased need for induction of labor, increased labor dystocia, increased incidents of larger gestational age neonates, increased risk for cesarean delivery and postpartum wound complications.  All of your patient's questions were answered.  While this was a lot of information for her to digest, she seemed to have an adequate understanding.    Summary of Recommendations: 1. Until removal of the liver mass, recommend monthly surveillance of CBC, CMP, coagulation profile in collaboration with GI medicine to manage symptomatology associated with this mass (ie, this is a non-obstetric condition that given her described discomfort, she should have a specialist with some degree of expertise in large, symptomatic hemangiomas/liver masses). 2. If General surgery feels resection is appropriate during pregnancy then the ideal time is 14-20 weeks to reduce risks to both mom and fetus 3. Given obesity,   A. Baseline preeclampsia labs  B. HgbA1c and TSH screen; early 1 hour GTT if HgbA1c <6.4%   C. Fetal survey at 18 weeks to be followed by serial growth q6 weeks  D. 11-20 lb gestational weight gain  E.  Precautions for preeclampsia after 20 weeks   Time Spent: I spent in excess of 30 minutes in consultation with this patient to review records, evaluate her case, and provide her with an adequate discussion and education.  More than 50% of this time was spent in direct face-to-face counseling.  It was a pleasure seeing your patient in the office today.  Thank you for consultation. Please do not hesitate to contact our service for any further questions.   Thank you,  Delman Cheadle Harl Favor, Delman Cheadle, MD, MS, FACOG Assistant Professor Section of [place]

## 2017-05-22 NOTE — Consult Note (Signed)
MFM consult, Staff Note:  I spoke to Misty Murillo  in regard to the liver mass that is causing her symptoms of abdominal discomfort.  This mass is thought to be a hemangioma and if general surgery feels it needs removal, the ideal time would be in the second trimester, specifically 14-20 weeks of gestation to generate the lowest risk for maternal complication of bleeding, preterm labor, and pregnancy loss.  I will copy her surgical consultant at [facility] for clearance by obstetrics for removal during that time should they feel it should be removed with recommendation for timing 14-20 weeks of pregnancy.    In the interim, I would recommend monthly LFT/CMP, coagulation profile, and CBC to ensure she is stable from a laboratory standpoint in addition to no acute deterioration (not expected) in maternal status.  If the liver mass removal is not pursued, I would continue to recommend that surveillance throughout the pregnancy along with additional follow up by GI medicine to manage symptoms in association with the mass as described by the patient to involve expertise in the management of this condition other than MFM or obstetrics.  I then shifted my discussion to her morbid obesity in pregnancy as defined by her BMI of 36.6.  I discussed risks and management strategies that are germane to obesity in pregnancy. Risk to pregnancy increases as the age and BMI of the patient increases.  Risks inherent to such pregnancies include, but are not limited to: miscarriage, intrauterine fetal demise, gestational diabetes, preeclampsia, and gestational hypertension.   Currently, I recommend that >20 weeks the patient should (in concert with prenatal visits) maintain close surveillance for symptoms to facilitate early detection of preeclampsia is necessary and closely monitor maternal blood pressure and renal function.  Due to increased risk for preeclampsia,  I recommend an early 24 hour urine collection for  baseline renal function.  Renal function will be assessed again if there is uncontrolled HTN or symptoms/signs of preeclampsia.    Because there is increased risk for gestational diabetes as well as occult or previously undiagnosed pregestational diabetes, I explained to her that we recommend a screening HgbA1c to assess background insulin resistance prior to exposing her to a 1 hour GTT.  Provided HgbA1c is less than 6.4%, I recommend early 1 hour GTT to screen for evidence of diabetes.  There is a potential for inadequate ultrasound screening for fetal anomalies and fetal well-being due to the obesity thus making it difficult to diagnose fetal abnormalities and or monitor the pregnancy.Interval growth assessments should be arranged by way of formal fetal biometry on ultrasound.    Also, there is an increased need for induction of labor, increased labor dystocia, increased incidents of larger gestational age neonates, increased risk for cesarean delivery and postpartum wound complications.  All of your patient's questions were answered.  While this was a lot of information for her to digest, she seemed to have an adequate understanding.    Summary of Recommendations: 1. Until removal of the liver mass, recommend monthly surveillance of CBC, CMP, coagulation profile in collaboration with GI medicine to manage symptomatology associated with this mass (ie, this is a non-obstetric condition that given her described discomfort, she should have a specialist with some degree of expertise in large, symptomatic hemangiomas/liver masses). 2. If General surgery feels resection is appropriate during pregnancy then the ideal time is 14-20 weeks to reduce risks to both mom and fetus 3. Given obesity,   A. Baseline preeclampsia labs  B. HgbA1c and TSH screen; early 1 hour GTT if HgbA1c <6.4%   C. Fetal survey at 18 weeks to be followed by serial growth q6 weeks  D. 11-20 lb gestational weight gain  E.  Precautions for preeclampsia after 20 weeks   Time Spent: I spent in excess of 30 minutes in consultation with this patient to review records, evaluate her case, and provide her with an adequate discussion and education.  More than 50% of this time was spent in direct face-to-face counseling.  It was a pleasure seeing your patient in the office today.  Thank you for consultation. Please do not hesitate to contact our service for any further questions.   Thank you,  Delman Cheadle Harl Favor, Delman Cheadle, MD, MS, FACOG Assistant Professor Section of [place]

## 2017-05-25 ENCOUNTER — Encounter (HOSPITAL_COMMUNITY): Payer: Self-pay | Admitting: *Deleted

## 2017-05-25 ENCOUNTER — Inpatient Hospital Stay (HOSPITAL_COMMUNITY)
Admission: AD | Admit: 2017-05-25 | Discharge: 2017-05-25 | Disposition: A | Discharge status: Home / Self Care | Payer: Medicaid Other | Source: Ambulatory Visit | Attending: Obstetrics & Gynecology | Admitting: Obstetrics & Gynecology

## 2017-05-25 DIAGNOSIS — O9989 Other specified diseases and conditions complicating pregnancy, childbirth and the puerperium: Secondary | ICD-10-CM | POA: Insufficient documentation

## 2017-05-25 DIAGNOSIS — R197 Diarrhea, unspecified: Secondary | ICD-10-CM | POA: Diagnosis not present

## 2017-05-25 DIAGNOSIS — O210 Mild hyperemesis gravidarum: Secondary | ICD-10-CM | POA: Insufficient documentation

## 2017-05-25 DIAGNOSIS — O219 Vomiting of pregnancy, unspecified: Secondary | ICD-10-CM

## 2017-05-25 DIAGNOSIS — J029 Acute pharyngitis, unspecified: Secondary | ICD-10-CM

## 2017-05-25 DIAGNOSIS — Z87891 Personal history of nicotine dependence: Secondary | ICD-10-CM | POA: Diagnosis not present

## 2017-05-25 DIAGNOSIS — O0991 Supervision of high risk pregnancy, unspecified, first trimester: Secondary | ICD-10-CM

## 2017-05-25 DIAGNOSIS — O26899 Other specified pregnancy related conditions, unspecified trimester: Secondary | ICD-10-CM

## 2017-05-25 DIAGNOSIS — R160 Hepatomegaly, not elsewhere classified: Secondary | ICD-10-CM

## 2017-05-25 DIAGNOSIS — Z3A08 8 weeks gestation of pregnancy: Secondary | ICD-10-CM | POA: Diagnosis not present

## 2017-05-25 LAB — SMN1 COPY NUMBER ANALYSIS (SMA CARRIER SCREENING)

## 2017-05-25 LAB — OBSTETRIC PANEL, INCLUDING HIV
Antibody Screen: NEGATIVE
BASOS ABS: 0 10*3/uL (ref 0.0–0.2)
Basos: 0 %
EOS (ABSOLUTE): 0.1 10*3/uL (ref 0.0–0.4)
Eos: 1 %
HEMATOCRIT: 34.2 % (ref 34.0–46.6)
HIV Screen 4th Generation wRfx: NONREACTIVE
Hemoglobin: 11.6 g/dL (ref 11.1–15.9)
Hepatitis B Surface Ag: NEGATIVE
IMMATURE GRANULOCYTES: 0 %
Immature Grans (Abs): 0 10*3/uL (ref 0.0–0.1)
LYMPHS ABS: 2.2 10*3/uL (ref 0.7–3.1)
Lymphs: 29 %
MCH: 27.4 pg (ref 26.6–33.0)
MCHC: 33.9 g/dL (ref 31.5–35.7)
MCV: 81 fL (ref 79–97)
MONOS ABS: 0.4 10*3/uL (ref 0.1–0.9)
Monocytes: 5 %
NEUTROS PCT: 65 %
Neutrophils Absolute: 4.9 10*3/uL (ref 1.4–7.0)
PLATELETS: 325 10*3/uL (ref 150–379)
RBC: 4.23 x10E6/uL (ref 3.77–5.28)
RDW: 14.6 % (ref 12.3–15.4)
RPR Ser Ql: NONREACTIVE
Rh Factor: POSITIVE
Rubella Antibodies, IGG: 2.5 index (ref >0.99)
WBC: 7.6 10*3/uL (ref 3.4–10.8)

## 2017-05-25 LAB — CYSTIC FIBROSIS GENE TEST

## 2017-05-25 LAB — URINALYSIS, ROUTINE W REFLEX MICROSCOPIC
BILIRUBIN URINE: NEGATIVE
GLUCOSE, UA: NEGATIVE mg/dL
HGB URINE DIPSTICK: NEGATIVE
KETONES UR: NEGATIVE mg/dL
Leukocytes, UA: NEGATIVE
Nitrite: NEGATIVE
PROTEIN: NEGATIVE mg/dL
Specific Gravity, Urine: 1.011 (ref 1.005–1.030)
pH: 8 (ref 5.0–8.0)

## 2017-05-25 LAB — HEMOGLOBINOPATHY EVALUATION
Ferritin: 63 ng/mL (ref 15–150)
HGB A2 QUANT: 3.8 % — AB (ref 1.8–3.2)
HGB C: 0 %
HGB F QUANT: 0 % (ref 0.0–2.0)
HGB S: 39.1 % — AB
Hgb A: 57.1 % — ABNORMAL LOW (ref 96.4–98.8)
Hgb Solubility: POSITIVE — AB
Hgb Variant: 0 %

## 2017-05-25 LAB — HEMOGLOBIN A1C
ESTIMATED AVERAGE GLUCOSE: 91 mg/dL
Hgb A1c MFr Bld: 4.8 % (ref 4.8–5.6)

## 2017-05-25 MED ORDER — PROMETHAZINE HCL 25 MG PO TABS: 25.0000 mg | ORAL_TABLET | Freq: Four times a day (QID) | ORAL | 0 refills | Status: DC | PRN

## 2017-05-25 NOTE — MAU Provider Note (Signed)
History     CSN: [id number]  Arrival date and time: 05/25/17 1112   First Provider Initiated Contact with Patient 05/25/17 1238      Chief Complaint  Patient presents with  . Emesis   HPI Misty Murillo 27 y.o. [redacted]w[redacted]d  Comes to MAU with Sore throat and cough since Saturday, diarrhea since Sunday and left sided mid back pain.  Has had periodic vomiting but was able to eat grits this morning and has kept down fluids today.  Also has an appointment with the surgeon as she has a mass in her liver.  Has had watery diarrhea for 2 days.   OB History    Gravida  2   Para  1   Term  1   Preterm  0   AB  0   Living  1     SAB  0   TAB  0   Ectopic  0   Multiple  0   Live Births  1           Past Medical History:  Diagnosis Date  . Liver mass   . Migraines     History reviewed. No pertinent surgical history.  Family History  Problem Relation Age of Onset  . Diabetes Mother   . Migraines Father   . Cancer Other     Social History   Tobacco Use  . Smoking status: Former Smoker    Types: Cigarettes    Last attempt to quit: 05/04/2017    Years since quitting: 0.0  . Smokeless tobacco: Never Used  Substance Use Topics  . Alcohol use: No    Comment: rarely   . Drug use: No    Allergies: No Known Allergies  Medications Prior to Admission  Medication Sig Dispense Refill Last Dose  . Prenatal Vit-Fe Fumarate-FA (PRENATAL MULTIVITAMIN) TABS tablet Take 1 tablet by mouth daily at 12 noon.   05/25/2017 at Unknown time  . EPINEPHrine 0.3 mg/0.3 mL IJ SOAJ injection Inject 0.3 mg into the muscle as needed (allergic reaction).   1 Emergency    Review of Systems  Constitutional: Negative for fever.  Gastrointestinal: Positive for abdominal pain, diarrhea, nausea and vomiting.  Genitourinary: Negative for dysuria, vaginal bleeding and vaginal discharge.  Musculoskeletal: Positive for back pain.   Physical Exam   Blood pressure 117/71, pulse 86, temperature 99  F (37.2 C), temperature source Oral, height 5\' 4"  (1.626 m), weight 230 lb (104.3 kg), last menstrual period 03/30/2017.  Physical Exam  Nursing note and vitals reviewed. Constitutional: She is oriented to person, place, and time. She appears well-developed and well-nourished.  HENT:  Head: Normocephalic.  Mouth/Throat: No oropharyngeal exudate.  Enlarged tonsils bilaterally but client feels pain in her throat only on the left side.  Pharynx pink. Very mild rhinorrhea noted likely causing postnasal drip.  Eyes: EOM are normal.  Neck: Neck supple.  GI: Soft. There is no tenderness. There is no rebound and no guarding.  Musculoskeletal: Normal range of motion.  Pain in left back from shoulder blade to to of hip bone.  Neurological: She is alert and oriented to person, place, and time.  Skin: Skin is warm and dry.  Psychiatric: She has a normal mood and affect.    MAU Course  Procedures Results for orders placed or performed during the hospital encounter of 05/25/17 (from the past 24 hour(s))  Urinalysis, Routine w reflex microscopic     Status: None   Collection Time: 05/25/17  11:50 AM  Result Value Ref Range   Color, Urine YELLOW YELLOW   APPearance CLEAR CLEAR   Specific Gravity, Urine 1.011 1.005 - 1.030   pH 8.0 5.0 - 8.0   Glucose, UA NEGATIVE NEGATIVE mg/dL   Hgb urine dipstick NEGATIVE NEGATIVE   Bilirubin Urine NEGATIVE NEGATIVE   Ketones, ur NEGATIVE NEGATIVE mg/dL   Protein, ur NEGATIVE NEGATIVE mg/dL   Nitrite NEGATIVE NEGATIVE   Leukocytes, UA NEGATIVE NEGATIVE    MDM Able to keep down food and fluids so IVF not needed today.  Will prescribe Phenergan tablets for her to use if having nausea.  Assessment and Plan  Sore throat Nausea and vomiting Diarrhea  Plan Drink at least 8 8-oz glasses of water every day. Take Tylenol 325 mg 2 tablets by mouth every 4 hours if needed for pain. Advance diet slowly but do not eat fried foods or high fat foods. Reviewed  sheet of medications she can take in pregnancy and recommended she take Claritin or Zyrtec for nasal congestion as it is likely causing postnasal drip and contributing to a sore throat. Recommended Imodium by the package directions for diarrhea. Follow up in the office with appointments as scheduled. Return if symptoms are worsening or if you develop fever.  Misty Murillo Misty Murillo 05/25/2017, 12:38 PM

## 2017-05-25 NOTE — Discharge Instructions (Signed)
Drink at least 8 8-oz glasses of water every day. Take Tylenol 325 mg 2 tablets by mouth every 4 hours if needed for pain. Call the office if you develop fever over 100.4 or have worsening symptoms. Can take Immodium by the package directions if the diarrhea is continuing.

## 2017-05-25 NOTE — Progress Notes (Signed)
Pt call transferred from the front office.  Pt informed me that she is having severe vomiting and nosebleeds.  The nosebleeds is something that she did not have in her prior pregnancy.  Pt also reports that she has a mass on her liver in which her provider informed her that could have possibly burst and she needed to be seen.  I encouraged pt to please come go to MAU for evaluation especially since she has that mass on her liver and she may need something more than what we can provide in the office.  Pt stated understanding with no further questions.

## 2017-05-25 NOTE — MAU Note (Signed)
Pt reports having a lot of diarrhea that started yesterday. Pt also c/o vomiting that she has had off and on since she was pregnant but now she is having blood tinged emesis and nose bleeds when she vomits.

## 2017-05-26 ENCOUNTER — Encounter: Payer: Self-pay | Admitting: Obstetrics and Gynecology

## 2017-05-26 DIAGNOSIS — D573 Sickle-cell trait: Secondary | ICD-10-CM | POA: Insufficient documentation

## 2017-05-26 NOTE — Progress Notes (Signed)
Reviewed case with Dr. Angeline Slim of General Surgery at [facility] where patient has been seen for liver mass. He has reviewed this case with Dr. Margurite Auerbach of MFM as well. He has reviewed extensively with patient the differential diagnosis, the risks of surgical resection for large liver mass and recommendation to wait until 2nd trimester for resection if possible. Per Dr. Angeline Slim, this is most likely hepatic adenoma however there is low risk if could be a differentiated fibrolamellar HCC which has a good prognosis.   Patient has been considering termination given the risks associated with a large liver mass during pregnancy (main concern for rupture and bleeding as well as other liver complications during pregnancy like pre-ecclampsia), will bring her back in 2 weeks for OB visit and discussion about her wishes. Will also obtain monthly CBC, CMP, 24 hr urine protein per MFM. She is to have repeat MRI with gen surg regarding mass and growth at the beginning of her 2nd trimester.   Feliz Beam, M.D. Attending [place], [facility] for [employer], [place]

## 2017-05-27 ENCOUNTER — Telehealth: Payer: Self-pay | Admitting: General Practice

## 2017-05-27 NOTE — Telephone Encounter (Signed)
Called patient to reschedule appointment on 06/26/17.  Time and provider changed on 06/26/17 d/t patient need to see an MD per Dr. Rosana Hoes.  Left message on VM for patient to give our office a call back.

## 2017-06-01 ENCOUNTER — Encounter: Payer: Self-pay | Admitting: *Deleted

## 2017-06-01 LAB — CYTOLOGY - PAP: CYTOLOGY - PAP: NEGATIVE

## 2017-06-01 LAB — HSV 2 ANTIBODY, IGG: HSV 2 IgG, Type Spec: <0.91

## 2017-06-01 LAB — HEPATITIS C ANTIBODY: Hepatitis C Ab: NEGATIVE

## 2017-06-03 ENCOUNTER — Telehealth: Payer: Self-pay | Admitting: Student

## 2017-06-03 NOTE — Telephone Encounter (Signed)
Encounter created in error

## 2017-06-26 ENCOUNTER — Encounter: Payer: Self-pay | Admitting: Family Medicine

## 2017-06-26 ENCOUNTER — Encounter: Payer: Self-pay | Admitting: Obstetrics and Gynecology

## 2017-07-11 DIAGNOSIS — L508 Other urticaria: Secondary | ICD-10-CM | POA: Insufficient documentation

## 2017-07-11 DIAGNOSIS — T783XXA Angioneurotic edema, initial encounter: Secondary | ICD-10-CM | POA: Insufficient documentation

## 2017-07-28 ENCOUNTER — Telehealth: Payer: Self-pay | Admitting: Advanced Practice Midwife

## 2017-07-28 NOTE — Telephone Encounter (Signed)
Called patient to give her an appointment. Patient stated she did not need to come to [facility] for anything else. She has had surgery on her liver, and is no longer pregnant.

## 2017-07-29 ENCOUNTER — Ambulatory Visit: Payer: Self-pay | Admitting: Obstetrics & Gynecology

## 2017-09-04 ENCOUNTER — Emergency Department (HOSPITAL_COMMUNITY): Payer: Medicaid Other

## 2017-09-04 ENCOUNTER — Encounter (HOSPITAL_COMMUNITY): Payer: Self-pay | Admitting: Emergency Medicine

## 2017-09-04 ENCOUNTER — Other Ambulatory Visit: Payer: Self-pay

## 2017-09-04 ENCOUNTER — Emergency Department (HOSPITAL_COMMUNITY)
Admission: EM | Admit: 2017-09-04 | Discharge: 2017-09-04 | Disposition: A | Discharge status: Home / Self Care | Payer: Medicaid Other | Attending: Emergency Medicine | Admitting: Emergency Medicine

## 2017-09-04 DIAGNOSIS — D573 Sickle-cell trait: Secondary | ICD-10-CM | POA: Insufficient documentation

## 2017-09-04 DIAGNOSIS — R1010 Upper abdominal pain, unspecified: Secondary | ICD-10-CM

## 2017-09-04 DIAGNOSIS — F17210 Nicotine dependence, cigarettes, uncomplicated: Secondary | ICD-10-CM | POA: Insufficient documentation

## 2017-09-04 LAB — CBC WITH DIFFERENTIAL/PLATELET
BASOS ABS: 0 10*3/uL (ref 0.0–0.1)
BASOS PCT: 0 %
EOS PCT: 1 %
Eosinophils Absolute: 0.1 10*3/uL (ref 0.0–0.7)
HEMATOCRIT: 33.8 % — AB (ref 36.0–46.0)
Hemoglobin: 11.7 g/dL — ABNORMAL LOW (ref 12.0–15.0)
Lymphocytes Relative: 36 %
Lymphs Abs: 3 10*3/uL (ref 0.7–4.0)
MCH: 26.4 pg (ref 26.0–34.0)
MCHC: 34.6 g/dL (ref 30.0–36.0)
MCV: 76.3 fL — ABNORMAL LOW (ref 78.0–100.0)
MONO ABS: 0.3 10*3/uL (ref 0.1–1.0)
Monocytes Relative: 4 %
NEUTROS ABS: 5.1 10*3/uL (ref 1.7–7.7)
Neutrophils Relative %: 59 %
PLATELETS: 323 10*3/uL (ref 150–400)
RBC: 4.43 MIL/uL (ref 3.87–5.11)
RDW: 14.1 % (ref 11.5–15.5)
WBC: 8.5 10*3/uL (ref 4.0–10.5)

## 2017-09-04 LAB — COMPREHENSIVE METABOLIC PANEL
ALT: 13 U/L (ref 0–44)
AST: 20 U/L (ref 15–41)
Albumin: 3.7 g/dL (ref 3.5–5.0)
Alkaline Phosphatase: 87 U/L (ref 38–126)
Anion gap: 6 (ref 5–15)
BUN: 9 mg/dL (ref 6–20)
CO2: 23 mmol/L (ref 22–32)
Calcium: 8.6 mg/dL — ABNORMAL LOW (ref 8.9–10.3)
Chloride: 106 mmol/L (ref 98–111)
Creatinine, Ser: 0.64 mg/dL (ref 0.44–1.00)
GFR calc Af Amer: >60 mL/min (ref >60)
GFR calc non Af Amer: >60 mL/min (ref >60)
Glucose, Bld: 86 mg/dL (ref 70–99)
POTASSIUM: 3.5 mmol/L (ref 3.5–5.1)
Sodium: 135 mmol/L (ref 135–145)
Total Bilirubin: 0.5 mg/dL (ref 0.3–1.2)
Total Protein: 7.2 g/dL (ref 6.5–8.1)

## 2017-09-04 LAB — PREGNANCY, URINE: Preg Test, Ur: NEGATIVE

## 2017-09-04 LAB — URINALYSIS, ROUTINE W REFLEX MICROSCOPIC
Bilirubin Urine: NEGATIVE
GLUCOSE, UA: NEGATIVE mg/dL
HGB URINE DIPSTICK: NEGATIVE
KETONES UR: NEGATIVE mg/dL
Leukocytes, UA: NEGATIVE
Nitrite: NEGATIVE
PROTEIN: NEGATIVE mg/dL
Specific Gravity, Urine: 1.011 (ref 1.005–1.030)
pH: 5 (ref 5.0–8.0)

## 2017-09-04 LAB — LIPASE, BLOOD: Lipase: 20 U/L (ref 11–51)

## 2017-09-04 MED ORDER — SODIUM CHLORIDE 0.9 % IV BOLUS
1000.0000 mL | Freq: Once | INTRAVENOUS | Status: AC
  Administered 2017-09-04: 1000 mL via INTRAVENOUS

## 2017-09-04 MED ORDER — IOPAMIDOL (ISOVUE-300) INJECTION 61%
100.0000 mL | Freq: Once | INTRAVENOUS | Status: AC | PRN
  Administered 2017-09-04: 100 mL via INTRAVENOUS

## 2017-09-04 MED ORDER — HYDROMORPHONE HCL 1 MG/ML IJ SOLN
1.0000 mg | Freq: Once | INTRAMUSCULAR | Status: AC
  Administered 2017-09-04: 1 mg via INTRAVENOUS
  Filled 2017-09-04: qty 1

## 2017-09-04 NOTE — ED Triage Notes (Signed)
Patient states she had liver resection 8 weeks ago and is complaining of abdominal pain and swelling x 2 days.

## 2017-09-04 NOTE — ED Provider Notes (Signed)
[facility] EMERGENCY DEPARTMENT Provider Note   CSN: [id number] Arrival date & time: 09/04/17  1836     History   Chief Complaint Chief Complaint  Patient presents with  . Abdominal Pain    HPI Misty Murillo is a 27 y.o. female.  HPI  27 year old female with a history of partial liver resection on 6/20 presents with upper abdominal pain.  It is epigastric and has been ongoing for about 2 days.  Waxes and wanes and is about a 7 out of 10.  No vomiting.  She feels like her upper abdomen is swollen.  No back pain, chest pain, shortness of breath, fever.  Has taken some Tylenol.  She states that she had to lesions and the smaller one was removed but they could not remove the larger one.  Op note states that it was found to be [place] and so the larger one, which would be riskier, was left alone.  Of note, patient was pregnant earlier this year but had an elective abortion in May. LMP 8/5.  Past Medical History:  Diagnosis Date  . Liver mass   . Migraines     Patient Active Problem List   Diagnosis Date Noted  . Sickle cell trait ([place]) 05/26/2017  . Obesity affecting pregnancy in first trimester   . Supervision of high risk pregnancy, antepartum, first trimester 05/18/2017  . Liver mass, left lobe 05/18/2017  . History of tobacco use in home 05/18/2017    Past Surgical History:  Procedure Laterality Date  . RESECTION LIVER       OB History    Gravida  2   Para  1   Term  1   Preterm  0   AB  0   Living  1     SAB  0   TAB  0   Ectopic  0   Multiple  0   Live Births  1            Home Medications    Prior to Admission medications   Medication Sig Start Date End Date Taking? Authorizing Provider  acetaminophen (TYLENOL) 325 MG tablet Take 650 mg by mouth every 6 (six) hours as needed.   Yes [provider]  EPINEPHrine 0.3 mg/0.3 mL IJ SOAJ injection Inject 0.3 mg into the muscle as needed (allergic reaction).  02/16/17  Yes [provider]    Family History Family History  Problem Relation Age of Onset  . Diabetes Mother   . Migraines Father   . Cancer Other     Social History Social History   Tobacco Use  . Smoking status: Current Every Day Smoker    Types: Cigarettes    Last attempt to quit: 05/04/2017    Years since quitting: 0.3  . Smokeless tobacco: Never Used  Substance Use Topics  . Alcohol use: No    Comment: rarely   . Drug use: No     Allergies   Patient has no known allergies.   Review of Systems Review of Systems  Constitutional: Negative for fever.  Respiratory: Negative for shortness of breath.   Cardiovascular: Negative for chest pain.  Gastrointestinal: Positive for abdominal distention and abdominal pain. Negative for nausea and vomiting.  Genitourinary: Negative for dysuria.  Musculoskeletal: Negative for back pain.  All other systems reviewed and are negative.    Physical Exam Updated Vital Signs BP 137/86   Pulse 67   Temp 98.2 F (36.8 C) (Oral)  Resp (!) 22   Ht 5\' 4"  (1.626 m)   Wt 102.5 kg   LMP 08/31/2017   SpO2 97%   Breastfeeding? Unknown   BMI 38.79 kg/m   Physical Exam  Constitutional: She is oriented to person, place, and time. She appears well-developed and well-nourished.  HENT:  Head: Normocephalic and atraumatic.  Right Ear: External ear normal.  Left Ear: External ear normal.  Nose: Nose normal.  Eyes: Right eye exhibits no discharge. Left eye exhibits no discharge.  Cardiovascular: Normal rate, regular rhythm and normal heart sounds.  Pulmonary/Chest: Effort normal and breath sounds normal.  Abdominal: Soft. There is tenderness in the epigastric area and periumbilical area. There is no CVA tenderness.  Well healed midline abdominal surgical scar.  Firmness, ?mass to the right of midline scar, possible hernia  Neurological: She is alert and oriented to person, place, and time.  Skin: Skin is warm and dry.  Nursing note and vitals  reviewed.    ED Treatments / Results  Labs (all labs ordered are listed, but only abnormal results are displayed) Labs Reviewed  COMPREHENSIVE METABOLIC PANEL - Abnormal; Notable for the following components:      Result Value   Calcium 8.6 (*)    All other components within normal limits  CBC WITH DIFFERENTIAL/PLATELET - Abnormal; Notable for the following components:   Hemoglobin 11.7 (*)    HCT 33.8 (*)    MCV 76.3 (*)    All other components within normal limits  URINALYSIS, ROUTINE W REFLEX MICROSCOPIC  PREGNANCY, URINE  LIPASE, BLOOD    EKG None  Radiology Ct Abdomen Pelvis W Contrast  Result Date: 09/04/2017 CLINICAL DATA:  Abdominal pain and swelling for the past 2 days. Status post resection of a smaller liver mass 8 weeks ago with pathology demonstrating focal nodular hyperplasia. Resection of the larger liver mass was not performed. EXAM: CT ABDOMEN AND PELVIS WITH CONTRAST TECHNIQUE: Multidetector CT imaging of the abdomen and pelvis was performed using the standard protocol following bolus administration of intravenous contrast. CONTRAST:  118mL ISOVUE-300 IOPAMIDOL (ISOVUE-300) INJECTION 61% COMPARISON:  Abdomen MRI dated 04/27/2017 and chest CTA dated 09/19/2015. FINDINGS: Lower chest: Mild bibasilar atelectasis. No pleural fluid. Hepatobiliary: The previously described large medial segment left lobe liver mass has not changed significantly since 04/27/2017, measuring 10.7 x 7.7 cm on image number 31 series 2. This measures 10.7 cm in length on sagittal image number 46. On the previous MR, this measured 10.2 x 9.8 x 7.8 cm in maximum corresponding dimensions by my measurements. The previously demonstrated smaller inferior right lobe liver mass is no longer visualized, with a wedge-shaped area of mild decreased density at that location today. No fluid collection is seen. Surgically absent gallbladder, removed at the time of recent surgery. Pancreas: Unremarkable. No  pancreatic ductal dilatation or surrounding inflammatory changes. Spleen: Normal in size without focal abnormality. Adrenals/Urinary Tract: Adrenal glands are unremarkable. Kidneys are normal, without renal calculi, focal lesion, or hydronephrosis. Bladder is unremarkable. Stomach/Bowel: Stomach is within normal limits. Appendix appears normal. No evidence of bowel wall thickening, distention, or inflammatory changes. Vascular/Lymphatic: No significant vascular findings are present. No enlarged abdominal or pelvic lymph nodes. Reproductive: 2.5 cm exophytic oval fundal uterine mass on the left. No adnexal mass. Other: Midline surgical scar the upper abdomen. Musculoskeletal: Minimal lumbar and mild lower thoracic spine degenerative changes. IMPRESSION: 1. No acute abnormality. 2. Surgical absence of the previously demonstrated smaller right lobe liver mass. 3. No significant change in  the large medial segment left lobe liver mass. Electronically Signed   By: Claudie Revering M.D.   On: 09/04/2017 21:12    Procedures Procedures (including critical care time)  Medications Ordered in ED Medications  sodium chloride 0.9 % bolus 1,000 mL (0 mLs Intravenous Stopped 09/04/17 2103)  HYDROmorphone (DILAUDID) injection 1 mg (1 mg Intravenous Given 09/04/17 2001)  iopamidol (ISOVUE-300) 61 % injection 100 mL (100 mLs Intravenous Contrast Given 09/04/17 2039)     Initial Impression / Assessment and Plan / ED Course  I have reviewed the triage vital signs and the nursing notes.  Pertinent labs & imaging results that were available during my care of the patient were reviewed by me and considered in my medical decision making (see chart for details).     CT scan is reassuring.  Lab work unremarkable besides minimal hypocalcemia that stable.  Hemoglobin stable.  I think the mass I was feeling was actually the liver mass.  There is no abdominal wall hernia or postoperative complication.  Vital signs are unremarkable.   She appears stable to follow-up with her surgeon as an outpatient.  Return precautions.  Final Clinical Impressions(s) / ED Diagnoses   Final diagnoses:  Upper abdominal pain    ED Discharge Orders    None       Sherwood Gambler, MD 09/04/17 2130

## 2017-09-04 NOTE — Discharge Instructions (Signed)
If you develop worsening abdominal pain, vomiting, fever, back pain, or any other new/concerning symptoms then return to the ER for evaluation.  Otherwise follow-up with your surgeon.

## 2018-01-27 ENCOUNTER — Encounter (HOSPITAL_COMMUNITY): Payer: Self-pay | Admitting: Emergency Medicine

## 2018-01-27 ENCOUNTER — Emergency Department (HOSPITAL_COMMUNITY)
Admission: EM | Admit: 2018-01-27 | Discharge: 2018-01-28 | Disposition: A | Discharge status: Home / Self Care | Payer: PRIVATE HEALTH INSURANCE | Attending: Emergency Medicine | Admitting: Emergency Medicine

## 2018-01-27 ENCOUNTER — Emergency Department (HOSPITAL_COMMUNITY): Payer: PRIVATE HEALTH INSURANCE

## 2018-01-27 ENCOUNTER — Other Ambulatory Visit: Payer: Self-pay

## 2018-01-27 DIAGNOSIS — R55 Syncope and collapse: Secondary | ICD-10-CM | POA: Insufficient documentation

## 2018-01-27 DIAGNOSIS — R0602 Shortness of breath: Secondary | ICD-10-CM | POA: Insufficient documentation

## 2018-01-27 DIAGNOSIS — F17210 Nicotine dependence, cigarettes, uncomplicated: Secondary | ICD-10-CM | POA: Diagnosis not present

## 2018-01-27 LAB — CBC
HEMATOCRIT: 40.9 % (ref 36.0–46.0)
Hemoglobin: 13.5 g/dL (ref 12.0–15.0)
MCH: 26.5 pg (ref 26.0–34.0)
MCHC: 33 g/dL (ref 30.0–36.0)
MCV: 80.2 fL (ref 80.0–100.0)
Platelets: 354 10*3/uL (ref 150–400)
RBC: 5.1 MIL/uL (ref 3.87–5.11)
RDW: 13.6 % (ref 11.5–15.5)
WBC: 9.8 10*3/uL (ref 4.0–10.5)
nRBC: 0 % (ref 0.0–0.2)

## 2018-01-27 LAB — BASIC METABOLIC PANEL
Anion gap: 7 (ref 5–15)
BUN: 11 mg/dL (ref 6–20)
CHLORIDE: 105 mmol/L (ref 98–111)
CO2: 23 mmol/L (ref 22–32)
CREATININE: 0.74 mg/dL (ref 0.44–1.00)
Calcium: 9 mg/dL (ref 8.9–10.3)
GFR calc Af Amer: >60 mL/min (ref >60)
GLUCOSE: 107 mg/dL — AB (ref 70–99)
Potassium: 3.8 mmol/L (ref 3.5–5.1)
SODIUM: 135 mmol/L (ref 135–145)

## 2018-01-27 LAB — RAPID URINE DRUG SCREEN, HOSP PERFORMED
AMPHETAMINES: NOT DETECTED
BENZODIAZEPINES: NOT DETECTED
Barbiturates: NOT DETECTED
Cocaine: NOT DETECTED
OPIATES: NOT DETECTED
TETRAHYDROCANNABINOL: POSITIVE — AB

## 2018-01-27 LAB — CBG MONITORING, ED: Glucose-Capillary: 108 mg/dL — ABNORMAL HIGH (ref 70–99)

## 2018-01-27 LAB — URINALYSIS, ROUTINE W REFLEX MICROSCOPIC
Bilirubin Urine: NEGATIVE
Glucose, UA: NEGATIVE mg/dL
KETONES UR: NEGATIVE mg/dL
Leukocytes, UA: NEGATIVE
NITRITE: NEGATIVE
Protein, ur: 100 mg/dL — AB
SPECIFIC GRAVITY, URINE: 1.013 (ref 1.005–1.030)
pH: 5 (ref 5.0–8.0)

## 2018-01-27 LAB — I-STAT BETA HCG BLOOD, ED (MC, WL, AP ONLY): I-stat hCG, quantitative: <5 m[IU]/mL (ref <5)

## 2018-01-27 LAB — D-DIMER, QUANTITATIVE: D-Dimer, Quant: 2.04 ug/mL-FEU — ABNORMAL HIGH (ref 0.00–0.50)

## 2018-01-27 MED ORDER — IOPAMIDOL (ISOVUE-370) INJECTION 76%
100.0000 mL | Freq: Once | INTRAVENOUS | Status: AC | PRN
  Administered 2018-01-28: 100 mL via INTRAVENOUS

## 2018-01-27 MED ORDER — SODIUM CHLORIDE 0.9 % IV BOLUS
1000.0000 mL | Freq: Once | INTRAVENOUS | Status: AC
  Administered 2018-01-27: 1000 mL via INTRAVENOUS

## 2018-01-27 NOTE — ED Provider Notes (Signed)
[facility] EMERGENCY DEPARTMENT Provider Note   CSN: [id number] Arrival date & time: 01/27/18  2200     History   Chief Complaint Chief Complaint  Patient presents with  . Loss of Consciousness    HPI Misty Murillo is a 28 y.o. female who presents to ED for syncopal episode that occurred prior to arrival.   History is provided by mother at bedside as well as patient.  Patient states that she was in her car smoking a black and mild.  She got out of her car, began walking into her house when she felt dizzy.  She does not remember falling, next thing she remembers is waking up on the floor.  States that she hit the front of her face on the concrete floor.  She tried to walk back into her house, then began feeling short of breath and like everything was spinning around her.  She then told her son to call 911.  She states that she has not eaten anything in 11 hours.  She does no history of similar symptoms in this prior summer where she again had a syncopal episode after smoking a black and mild.  She was seen and evaluated at an ED in [facility] and had negative and reassuring work-up including CT scan and lab work.  She currently complains of a frontal headache from where she hit her face.  Denies any vision changes, vomiting but does endorse nausea.  Denies any anticoagulant use, chest pain, shortness of breath, possibility of pregnancy, recent medication changes.  She denies any alcohol, other drug use. She is status post liver resection on 07/09/2017 for liver mass/hemangioma.  She does note a somewhat sedentary lifestyle especially at work.  HPI  Past Medical History:  Diagnosis Date  . Liver mass   . Migraines     Patient Active Problem List   Diagnosis Date Noted  . Sickle cell trait ([place]) 05/26/2017  . Obesity affecting pregnancy in first trimester   . Supervision of high risk pregnancy, antepartum, first trimester 05/18/2017  . Liver mass, left lobe 05/18/2017  .  History of tobacco use in home 05/18/2017    Past Surgical History:  Procedure Laterality Date  . RESECTION LIVER       OB History    Gravida  2   Para  1   Term  1   Preterm  0   AB  0   Living  1     SAB  0   TAB  0   Ectopic  0   Multiple  0   Live Births  1            Home Medications    Prior to Admission medications   Medication Sig Start Date End Date Taking? Authorizing Provider  acetaminophen (TYLENOL) 325 MG tablet Take 650 mg by mouth every 6 (six) hours as needed.    [provider]  EPINEPHrine 0.3 mg/0.3 mL IJ SOAJ injection Inject 0.3 mg into the muscle as needed (allergic reaction).  02/16/17   [provider]    Family History Family History  Problem Relation Age of Onset  . Diabetes Mother   . Migraines Father   . Cancer Other     Social History Social History   Tobacco Use  . Smoking status: Current Every Day Smoker    Types: Cigarettes    Last attempt to quit: 05/04/2017    Years since quitting: 0.7  .  Smokeless tobacco: Never Used  Substance Use Topics  . Alcohol use: No    Comment: rarely   . Drug use: No     Allergies   Patient has no known allergies.   Review of Systems Review of Systems  Constitutional: Negative for appetite change, chills and fever.  HENT: Negative for ear pain, rhinorrhea, sneezing and sore throat.   Eyes: Negative for photophobia and visual disturbance.  Respiratory: Negative for cough, chest tightness, shortness of breath and wheezing.   Cardiovascular: Negative for chest pain and palpitations.  Gastrointestinal: Negative for abdominal pain, blood in stool, constipation, diarrhea, nausea and vomiting.  Genitourinary: Negative for dysuria, hematuria and urgency.  Musculoskeletal: Negative for myalgias.  Skin: Positive for wound. Negative for rash.  Neurological: Positive for syncope and headaches. Negative for dizziness, weakness and light-headedness.     Physical  Exam Updated Vital Signs BP 110/67   Pulse 67   Temp 97.9 F (36.6 C) (Oral)   Resp 19   Ht 5\' 3"  (1.6 m)   Wt 99.8 kg   LMP 01/06/2018   SpO2 100%   BMI 38.97 kg/m   Physical Exam Vitals signs and nursing note reviewed.  Constitutional:      General: She is not in acute distress.    Appearance: She is well-developed.  HENT:     Head: Normocephalic and atraumatic.     Nose: Nose normal.  Eyes:     General: No scleral icterus.       Right eye: No discharge.        Left eye: No discharge.     Conjunctiva/sclera: Conjunctivae normal.     Pupils: Pupils are equal, round, and reactive to light.  Neck:     Musculoskeletal: Normal range of motion and neck supple.  Cardiovascular:     Rate and Rhythm: Normal rate and regular rhythm.     Heart sounds: Normal heart sounds. No murmur. No friction rub. No gallop.   Pulmonary:     Effort: Pulmonary effort is normal. No respiratory distress.     Breath sounds: Normal breath sounds.  Abdominal:     General: Bowel sounds are normal. There is no distension.     Palpations: Abdomen is soft.     Tenderness: There is no abdominal tenderness. There is no guarding.     Comments: Well-healing vertical surgical scar noted on abdomen.  Musculoskeletal: Normal range of motion.  Skin:    General: Skin is warm and dry.     Findings: No rash.     Comments: Skin abrasions noted to left forehead and cheek.  No lacerations noted.  Neurological:     General: No focal deficit present.     Mental Status: She is alert and oriented to person, place, and time.     Cranial Nerves: No cranial nerve deficit.     Sensory: No sensory deficit.     Motor: No weakness or abnormal muscle tone.     Coordination: Coordination normal.     Comments: Alert and oriented to self, place, time and situation. PERRL. No facial asymmetry noted. Cranial nerves appear grossly intact. Sensation intact to light touch on face, BUE and BLE. Strength 5/5 in BUE and BLE.  Normal  finger-to-nose coordination bilaterally.      ED Treatments / Results  Labs (all labs ordered are listed, but only abnormal results are displayed) Labs Reviewed  BASIC METABOLIC PANEL - Abnormal; Notable for the following components:  Result Value   Glucose, Bld 107 (*)    All other components within normal limits  URINALYSIS, ROUTINE W REFLEX MICROSCOPIC - Abnormal; Notable for the following components:   APPearance HAZY (*)    Hgb urine dipstick SMALL (*)    Protein, ur 100 (*)    Bacteria, UA RARE (*)    All other components within normal limits  RAPID URINE DRUG SCREEN, HOSP PERFORMED - Abnormal; Notable for the following components:   Tetrahydrocannabinol POSITIVE (*)    All other components within normal limits  D-DIMER, QUANTITATIVE (NOT AT [facility]) - Abnormal; Notable for the following components:   D-Dimer, Quant 2.04 (*)    All other components within normal limits  CBG MONITORING, ED - Abnormal; Notable for the following components:   Glucose-Capillary 108 (*)    All other components within normal limits  CBC  I-STAT BETA HCG BLOOD, ED ([facility], [facility], [facility] ONLY)    EKG EKG Interpretation  Date/Time:  Wednesday January 27 2018 22:24:27 EST Ventricular Rate:  77 PR Interval:    QRS Duration: 82 QT Interval:  369 QTC Calculation: 418 R Axis:   49 Text Interpretation:  Sinus rhythm Nonspecific T abnormalities, diffuse leads No STEMI.  Confirmed by Nanda Quinton [phone]) on 01/27/2018 10:48:33 PM   Radiology No results found.  Procedures Procedures (including critical care time)  Medications Ordered in ED Medications  iopamidol (ISOVUE-370) 76 % injection 100 mL (has no administration in time range)  acetaminophen (TYLENOL) tablet 650 mg (has no administration in time range)  sodium chloride 0.9 % bolus 1,000 mL (0 mLs Intravenous Stopped 01/28/18 0042)     Initial Impression / Assessment and Plan / ED Course  I have reviewed the triage vital signs and the nursing  notes.  Pertinent labs & imaging results that were available during my care of the patient were reviewed by me and considered in my medical decision making (see chart for details).    28 year old female presents to ED for syncopal episode that occurred prior to arrival.  States that she was in her car smoking a black and mild.  She got out of her car, tried to walk home from her driveway.  She remembers waking up on the ground.  Mother does note history of similar symptoms this past summer after smoking a black and mild and was evaluated in an ED in [place].  She had reassuring work-up done at that time.  She does note dizziness since then as well as shortness of breath.  She has not eaten anything for the past 11 hours and mother knows that she does not eat as well as she should.  No history of diabetes, PE, DVT, recent immobilization but does endorse a sedentary lifestyle.  She denies any chest pain or possibility of pregnancy.  Plan is to obtain lab work, imaging, orthostatic vital signs, give fluids and reassess. EKG shows nonspecific T wave abnormalities not present in prior tracings.  No arrhythmia or signs of ischemia noted. She continues to deny chest pain.   Clinical Course as of Jan 28 53  Wed Jan 27, 2018  2342 Tetrahydrocannabinol(!): POSITIVE [HK]  2342 Will order CT of the chest to rule out PE.  D-Dimer, Quant(!): 2.04 [HK]  2342 I-stat hCG, quantitative: <5.0 [HK]    Clinical Course User Index [HK] Laurin Paulo, PA-C   CT of the head, CTA of the chest and x-ray pending at this time. Care handed off to Dr. Stark Jock.  Final  Clinical Impressions(s) / ED Diagnoses   Final diagnoses:  Syncope, unspecified syncope type    ED Discharge Orders    None       Delia Heady, PA-C 01/28/18 0057    Margette Fast, MD 01/28/18 1310

## 2018-01-27 NOTE — ED Triage Notes (Signed)
Per mom, pt had a syncopal episode at home hitting face on concrete. Pt has scrapes above the left eye and below left eye, both knees. Pt c/o nausea.

## 2018-01-28 ENCOUNTER — Emergency Department (HOSPITAL_COMMUNITY): Payer: PRIVATE HEALTH INSURANCE

## 2018-01-28 MED ORDER — ACETAMINOPHEN 325 MG PO TABS
650.0000 mg | ORAL_TABLET | Freq: Once | ORAL | Status: AC
  Administered 2018-01-28: 650 mg via ORAL
  Filled 2018-01-28: qty 2

## 2018-01-28 NOTE — Discharge Instructions (Addendum)
Drink plenty of fluids and get plenty of rest.  Follow-up with your primary doctor if not feeling better in the next 3 to 4 days, and return to the ER if symptoms significantly worsen or change.

## 2018-01-29 ENCOUNTER — Emergency Department (HOSPITAL_COMMUNITY)
Admission: EM | Admit: 2018-01-29 | Discharge: 2018-01-29 | Disposition: A | Discharge status: Home / Self Care | Payer: PRIVATE HEALTH INSURANCE | Attending: Emergency Medicine | Admitting: Emergency Medicine

## 2018-01-29 ENCOUNTER — Encounter (HOSPITAL_COMMUNITY): Payer: Self-pay

## 2018-01-29 ENCOUNTER — Other Ambulatory Visit: Payer: Self-pay

## 2018-01-29 ENCOUNTER — Encounter (HOSPITAL_COMMUNITY): Payer: Self-pay | Admitting: *Deleted

## 2018-01-29 ENCOUNTER — Ambulatory Visit (INDEPENDENT_AMBULATORY_CARE_PROVIDER_SITE_OTHER)
Admission: EM | Admit: 2018-01-29 | Discharge: 2018-01-29 | Disposition: A | Discharge status: Home / Self Care | Payer: PRIVATE HEALTH INSURANCE | Source: Home / Self Care | Attending: Internal Medicine | Admitting: Internal Medicine

## 2018-01-29 ENCOUNTER — Telehealth: Payer: Self-pay | Admitting: General Practice

## 2018-01-29 ENCOUNTER — Telehealth (HOSPITAL_COMMUNITY): Payer: Self-pay | Admitting: Emergency Medicine

## 2018-01-29 DIAGNOSIS — H05012 Cellulitis of left orbit: Secondary | ICD-10-CM

## 2018-01-29 DIAGNOSIS — Z5321 Procedure and treatment not carried out due to patient leaving prior to being seen by health care provider: Secondary | ICD-10-CM | POA: Insufficient documentation

## 2018-01-29 DIAGNOSIS — R220 Localized swelling, mass and lump, head: Secondary | ICD-10-CM | POA: Diagnosis not present

## 2018-01-29 MED ORDER — CEPHALEXIN 250 MG/5ML PO SUSR: ORAL | 0 refills | Status: DC

## 2018-01-29 NOTE — ED Triage Notes (Signed)
Pt presents with left facial/eye swelling and bruising from a fall on Wednesday; pt states she did have a LOC and she has had some significant swelling and headaches since then.

## 2018-01-29 NOTE — ED Provider Notes (Addendum)
[facility]    CSN: [id number] Arrival date & time: 01/29/18  [place]     History   Chief Complaint Chief Complaint  Patient presents with  . Facial Swelling   HPI Misty Murillo is a 28 y.o. female.   Pt fainted 3 days ago after smoking a cigarette( not laced or MJ) that comes as single package, which she has had before and caused her to faint in the past. She landed on her L face and she recalls hitting the ground. She went to ER and had head CT which I reviewed and mentioned sinus and orbits were negative for fracture,and negative for brain bleed.     Past Medical History:  Diagnosis Date  . Liver mass   . Migraines     Patient Active Problem List   Diagnosis Date Noted  . Sickle cell trait ([place]) 05/26/2017  . Obesity affecting pregnancy in first trimester   . Supervision of high risk pregnancy, antepartum, first trimester 05/18/2017  . Liver mass, left lobe 05/18/2017  . History of tobacco use in home 05/18/2017    Past Surgical History:  Procedure Laterality Date  . CHOLECYSTECTOMY    . RESECTION LIVER      OB History    Gravida  2   Para  1   Term  1   Preterm  0   AB  0   Living  1     SAB  0   TAB  0   Ectopic  0   Multiple  0   Live Births  1            Home Medications    Prior to Admission medications   Medication Sig Start Date End Date Taking? Authorizing Provider  acetaminophen (TYLENOL) 325 MG tablet Take 650 mg by mouth every 6 (six) hours as needed.    [provider]  cephALEXin (KEFLEX) 250 MG/5ML suspension 4 tsp bid x 10 days for cellulitis 01/29/18   Rodriguez-Southworth, Sunday Spillers, PA-C  EPINEPHrine 0.3 mg/0.3 mL IJ SOAJ injection Inject 0.3 mg into the muscle as needed (allergic reaction).  02/16/17   [provider]    Family History Family History  Problem Relation Age of Onset  . Diabetes Mother   . Migraines Father   . Cancer Other     Social History Social History   Tobacco Use   . Smoking status: Former Smoker    Types: Cigarettes    Last attempt to quit: 01/27/2018  . Smokeless tobacco: Never Used  Substance Use Topics  . Alcohol use: No    Comment: rarely   . Drug use: No     Allergies   Patient has no known allergies.   Review of Systems Review of Systems  Constitutional: Negative for chills, diaphoresis and fever.  HENT: Positive for facial swelling. Negative for ear discharge and ear pain.   Eyes: Positive for discharge.       L eye has had mild tearing     Physical Exam Triage Vital Signs ED Triage Vitals  Enc Vitals Group     BP 01/29/18 1755 133/78     Pulse Rate 01/29/18 1755 70     Resp 01/29/18 1755 20     Temp 01/29/18 1755 98 F (36.7 C)     Temp Source 01/29/18 1755 Oral     SpO2 01/29/18 1755 100 %     Weight --      Height --  Head Circumference --      Peak Flow --      Pain Score 01/29/18 1756 8     Pain Loc --      Pain Edu? --      Excl. in [place]? --    No data found.  Updated Vital Signs BP 133/78 (BP Location: Right Arm)   Pulse 70   Temp 98 F (36.7 C) (Oral)   Resp 20   LMP 01/06/2018   SpO2 100%   Visual Acuity Right Eye Distance:   Left Eye Distance:   Bilateral Distance:    Right Eye Near:   Left Eye Near:    Bilateral Near:     Physical Exam Constitutional:      General: She is not in acute distress.    Appearance: She is obese. She is not toxic-appearing.  HENT:     Right Ear: Tympanic membrane and external ear normal.     Left Ear: Tympanic membrane and external ear normal.     Nose: Nose normal.  Eyes:     General: No scleral icterus.       Right eye: No discharge.        Left eye: No discharge.     Extraocular Movements: Extraocular movements intact.     Conjunctiva/sclera: Conjunctivae normal.     Pupils: Pupils are equal, round, and reactive to light.  Neck:     Musculoskeletal: Neck supple.  Pulmonary:     Effort: Pulmonary effort is normal.  Musculoskeletal: Normal range  of motion.  Skin:    Findings: Erythema present.     Comments: Has healing abrasion with scab on her L forehead over her brow, and L cheek bone. Her L upper lid is completely swollen and lid is shut, has mild erythema on it, cheek and lower orbit has mild erythema and warmth. No pain with ROM of eyes. Is tender to palpation of this areas.   Neurological:     General: No focal deficit present.     Mental Status: She is alert and oriented to person, place, and time.     Motor: No weakness.     Gait: Gait normal.  Psychiatric:        Mood and Affect: Mood normal.        Behavior: Behavior normal.        Thought Content: Thought content normal.        Judgment: Judgment normal.    UC Treatments / Results  Labs (all labs ordered are listed, but only abnormal results are displayed) Labs Reviewed - No data to display  EKG None  Radiology Dg Chest 2 View  Result Date: 01/28/2018 CLINICAL DATA:  Syncope. Shortness of breath. EXAM: CHEST - 2 VIEW COMPARISON:  Radiographs 04/18/2017 FINDINGS: The cardiomediastinal contours are normal. The lungs are clear. Pulmonary vasculature is normal. No consolidation, pleural effusion, or pneumothorax. No acute osseous abnormalities are seen. IMPRESSION: No acute chest findings. Electronically Signed   By: Keith Rake M.D.   On: 01/28/2018 00:59   Ct Head Wo Contrast  Result Date: 01/28/2018 CLINICAL DATA:  Head trauma, minor, GCS>=13, high clinical risk, initial exam. Syncope striking head. EXAM: CT HEAD WITHOUT CONTRAST TECHNIQUE: Contiguous axial images were obtained from the base of the skull through the vertex without intravenous contrast. COMPARISON:  None. FINDINGS: Brain: Slightly low lying cerebellar tonsils, likely incidental. No intracranial hemorrhage, mass effect, or midline shift. No hydrocephalus. No evidence of territorial infarct or acute  ischemia. No extra-axial or intracranial fluid collection. Vascular: No hyperdense vessel. Skull: No  fracture or focal lesion. Sinuses/Orbits: Paranasal sinuses and mastoid air cells are clear. The visualized orbits are unremarkable. Other: None. IMPRESSION: No acute intracranial abnormality.  No skull fracture. Electronically Signed   By: Keith Rake M.D.   On: 01/28/2018 00:58   Ct Angio Chest Pe W/cm &/or Wo Cm  Result Date: 01/28/2018 CLINICAL DATA:  Syncope. Pulmonary embolus suspected, intermediate probability. Positive D-dimer. EXAM: CT ANGIOGRAPHY CHEST WITH CONTRAST TECHNIQUE: Multidetector CT imaging of the chest was performed using the standard protocol during bolus administration of intravenous contrast. Multiplanar CT image reconstructions and MIPs were obtained to evaluate the vascular anatomy. CONTRAST:  134mL ISOVUE-370 IOPAMIDOL (ISOVUE-370) INJECTION 76% COMPARISON:  Chest radiographs earlier this day. FINDINGS: Cardiovascular: There are no filling defects within the pulmonary arteries to suggest pulmonary embolus. Thoracic aorta is normal caliber without dissection. The heart is normal in size. No pericardial effusion. Mediastinum/Nodes: Small mediastinal and hilar nodes not enlarged by size criteria. Mild thyromegaly without focal thyroid nodule. The esophagus is decompressed. Lungs/Pleura: Linear atelectasis in the right lower and middle lobes. No confluent airspace disease. No pulmonary edema. Pleural effusion. No pulmonary mass. Upper Abdomen: Central hyperdense hepatic masses partially included, not significantly changed from 09/04/2017 CT. Musculoskeletal: There are no acute or suspicious osseous abnormalities. Review of the MIP images confirms the above findings. IMPRESSION: 1. No pulmonary embolus. 2. Linear atelectasis in the right middle and lower lobes. Electronically Signed   By: Keith Rake M.D.   On: 01/28/2018 02:04    Procedures Procedures   Medications Ordered in UC Medications - No data to display  Initial Impression / Assessment and Plan / UC Course  I  have reviewed the triage vital signs and the nursing notes. Pertinent prior outside imaging results that were available during my care of the patient were reviewed by me. I placed her on Keflex liquid per her request as noted. I reviewed signs of orbital cellulitis and what to watch out for, and if this happens needs to go to ER. See instructions    Final Clinical Impressions(s) / UC Diagnoses   Final diagnoses:  Cellulitis of left orbital region     Discharge Instructions     If your develop pain inside your eye ball with movement and get a fever, you need to go to the ER.      ED Prescriptions    Medication Sig Dispense Auth. Provider   cephALEXin (KEFLEX) 250 MG/5ML suspension 4 tsp bid x 10 days for cellulitis 400 mL Rodriguez-Southworth, Sunday Spillers, PA-C     Controlled Substance Prescriptions [place] Controlled Substance Registry consulted?   Shelby [place], PA-C 01/29/18 2148    Rodriguez-Southworth, [place], PA-C 01/29/18 2148

## 2018-01-29 NOTE — ED Triage Notes (Signed)
Pt c/o left eye swelling that started last night. Pt was seen here at [place] on Wednesday after syncopal episode where she fell onto concrete face down. Pt reports she had scrapes around the left eye on Wednesday but no swelling. Pt reports last night the swelling started and her left eye is swollen shut. Denies use of blood thinners.

## 2018-01-29 NOTE — Telephone Encounter (Signed)
Called and spoke with patient.  Seen at [facility] ED on 1/8 for syncopal episode.  Pt states that she fell yest on 1/9 and hit her face on the concrete.  Now c/o swelling and bruising of face and left eye is swollen shut.  She is having a significant amt of pain and states that when she bends over her "head feels like it is going to explode".  Advised that we do not have any available openings today but strongly advised going back to ED or to Urgent Care center to be evaluated and xrayed.  Pt agreeable to this and states she will go the [facility]gent Care center on [facility].

## 2018-01-29 NOTE — Discharge Instructions (Addendum)
If your develop pain inside your eye ball with movement and get a fever, you need to go to the ER.

## 2018-01-29 NOTE — ED Notes (Signed)
Pt left without being seen pt told the front desk that she was leaving

## 2018-01-29 NOTE — Telephone Encounter (Signed)
Copied from [place] [id number]. Topic: Appointment Scheduling - Scheduling Inquiry for Clinic >> Jan 29, 2018 12:10 PM Misty Murillo wrote: Reason for CRM: patient is calling to state she was seen at the ED on 01-27-2018 for syncope. The patient stated she fail and hit her face on cement, and  today she has woken up with a swollen eye. She is schedule to be a new patient with Dr.Wolfe  on 02-26-2018. She is requesting to be seen before she is established. She is requesting a call back. Please advise

## 2018-02-02 ENCOUNTER — Ambulatory Visit: Payer: Self-pay

## 2018-02-02 NOTE — Telephone Encounter (Signed)
Please advise 

## 2018-02-02 NOTE — Telephone Encounter (Signed)
Called and left detailed message on patient's voicemail advising that we need to move establish care appt up sooner than originally scheduled for 2/7.    Requested call back from pt to r/s appt.  CRM created.

## 2018-02-02 NOTE — Telephone Encounter (Signed)
Pt called stating that last Wednesday she fainted with out cause and was seen in the ED for head injury but was cleared. Per patient the ED doctors say she is healthy. Today while at the gas station on her way to work she had a near syncopal episode.  She states that she got extremely weak and dizzy. She broke into a sweat. She states that the people around her gave her a snack. She is now at work but gives Hx of room spinning when she looks up from her desk.  She states she is not feeling that way now.  She feel nauseated when the spells happen but she denies vomiting.  She takes no medications. Per protocol pt will go to ED for further evaluation. Care advice read to patient. Pt verbalized understanding of all instructions.  Reason for Disposition . Patient sounds very sick or weak to the triager  Answer Assessment - Initial Assessment Questions 1. DESCRIPTION: "Describe your dizziness."     WOOZY LIGHTHEADED TREMBLING 2. LIGHTHEADED: "Do you feel lightheaded?" (e.g., somewhat faint, woozy, weak upon standing)     WEAK WHEN STANDING sometimes 3. VERTIGO: "Do you feel like either you or the room is spinning or tilting?" (i.e. vertigo)     yes 4. SEVERITY: "How bad is it?"  "Do you feel like you are going to faint?" "Can you stand and walk?"   - MILD - walking normally   - MODERATE - interferes with normal activities (e.g., work, school)    - SEVERE - unable to stand, requires support to walk, feels like passing out now.      Severe with the spells now ok 5. ONSET:  "When did the dizziness begin?"     Wednesday night with a fainting spell 6. AGGRAVATING FACTORS: "Does anything make it worse?" (e.g., standing, change in head position)     Change in head position but just at that moment 7. HEART RATE: "Can you tell me your heart rate?" "How many beats in 15 seconds?"  (Note: not all patients can do this)       No regular 8. CAUSE: "What do you think is causing the dizziness?"    No has been  seen in ED 9. RECURRENT SYMPTOM: "Have you had dizziness before?" If so, ask: "When was the last time?" "What happened that time?"     July last year 10. OTHER SYMPTOMS: "Do you have any other symptoms?" (e.g., fever, chest pain, vomiting, diarrhea, bleeding)       Nausea no vomiting  11. PREGNANCY: "Is there any chance you are pregnant?" "When was your last menstrual period?"       No checked in ED last Wednesday.  Protocols used: DIZZINESS [facility]

## 2018-02-15 ENCOUNTER — Ambulatory Visit: Payer: PRIVATE HEALTH INSURANCE | Admitting: Family Medicine

## 2018-02-26 ENCOUNTER — Ambulatory Visit: Payer: PRIVATE HEALTH INSURANCE | Admitting: Family Medicine

## 2018-03-05 ENCOUNTER — Ambulatory Visit: Payer: PRIVATE HEALTH INSURANCE | Admitting: Family Medicine

## 2018-03-05 ENCOUNTER — Encounter: Payer: Self-pay | Admitting: Family Medicine

## 2018-03-05 VITALS — BP 122/84 | HR 68 | Temp 98.0°F | Ht 63.0 in | Wt 230.4 lb

## 2018-03-05 DIAGNOSIS — R55 Syncope and collapse: Secondary | ICD-10-CM | POA: Diagnosis not present

## 2018-03-05 DIAGNOSIS — R519 Headache, unspecified: Secondary | ICD-10-CM

## 2018-03-05 DIAGNOSIS — R51 Headache: Secondary | ICD-10-CM | POA: Diagnosis not present

## 2018-03-05 DIAGNOSIS — Z0000 Encounter for general adult medical examination without abnormal findings: Secondary | ICD-10-CM | POA: Diagnosis not present

## 2018-03-05 DIAGNOSIS — F329 Major depressive disorder, single episode, unspecified: Secondary | ICD-10-CM | POA: Diagnosis not present

## 2018-03-05 DIAGNOSIS — R160 Hepatomegaly, not elsewhere classified: Secondary | ICD-10-CM | POA: Diagnosis not present

## 2018-03-05 DIAGNOSIS — F419 Anxiety disorder, unspecified: Secondary | ICD-10-CM | POA: Diagnosis not present

## 2018-03-05 DIAGNOSIS — F32A Depression, unspecified: Secondary | ICD-10-CM

## 2018-03-05 MED ORDER — HYDROXYZINE HCL 25 MG PO TABS: 25.0000 mg | ORAL_TABLET | Freq: Three times a day (TID) | ORAL | 1 refills | Status: DC | PRN

## 2018-03-05 MED ORDER — FLUOXETINE HCL 20 MG PO TABS: 20.0000 mg | ORAL_TABLET | Freq: Every day | ORAL | 1 refills | Status: DC

## 2018-03-05 NOTE — Patient Instructions (Addendum)
Starting prozac for anxiety/depression. Will see you back in one month for recheck. If any suicidal thoughts stop medication and call 911. For panic attacks sending in hydroxzyine as needed. Can take up to three times a day. The full pill may make you really sleepy so may start with half a tab.   Start exercising and look into counseling.   Keep a headache log for me. Like to see if we can figure out trigger so we can try to decrease this. Start magnesium 500mg /day and I like ibuprofen as needed for headaches. Safe for liver. Would also get your eyes checked. Will dive into this more at f/u.   Labs today for annual/thyroid check/etc.   Think about tetanus. I do recommend this!

## 2018-03-05 NOTE — Progress Notes (Signed)
Patient: Misty Murillo MRN: [id number] DOB: 1990/06/05 PCP: Orma Flaming, MD     Subjective:  Chief Complaint  Patient presents with  . Establish Care    HPI: The patient is a 28 y.o. female who presents today for establish care.   She has hx of liver mass that was resected in 2019. Found to be benign, focal nodular hyperplasia. Large mass aborted. Needed f/u MRI in 6 months and overdue for this. Also needs new GI doc.   Syncopal episodes: She has 2 episodes of this. One in July of 2019 and one in January of this year. Went to ER this year with negative work up. She thinks smoking played a major part in this and not eating. She has been fine since she adjusted life style and no longer smokes, including MJ and makes sure and eats. Feels good.   Anxiety/depression: never been officially diagnosed, but thinks it kicked it when she had her son. She feels like this is normal for her. She is a very emotional person. She states she can be angry and the anxiety can make her break down and cry. Not coping well and with the death of her brother it has gotten much worse. She is also not sleeping. She also night walks. Denies any homicidal thoughts, but has had fleeting thoughts of suicide of thinking how it would be easier, but she has never had a plan and states she is too chicken to act on it. Seh has never been on any kind of medication. She has never been in counseling, but has talked about it. Job has some options. She does not exercise, but wants to do this. She does think she has panic attacks. Mild in nature.    Family hx of diabetes and hyperthyroid in her mother and HTN in her father. Brother died from complications from Graves disease.    Also mentions headaches..   Tdap: will think about this Pap smear: 03/05/2017. Repeat in 3 years.   Review of Systems  Constitutional: Positive for fatigue. Negative for fever.  Eyes: Positive for visual disturbance.  Respiratory: Negative for cough  and shortness of breath.   Cardiovascular: Negative for chest pain and palpitations.  Gastrointestinal: Positive for constipation. Negative for abdominal pain, diarrhea and nausea.  Musculoskeletal: Positive for back pain. Negative for neck pain.  Skin: Negative.   Neurological: Positive for headaches. Negative for dizziness and syncope.  Psychiatric/Behavioral: Negative for behavioral problems, decreased concentration, sleep disturbance and suicidal ideas. The patient is not nervous/anxious.     Allergies Patient has No Known Allergies.  Past Medical History Patient  has a past medical history of Liver mass and Migraines.  Surgical History Patient  has a past surgical history that includes Resection liver and Cholecystectomy.  Family History Pateint's family history includes Cancer in an other family member; Diabetes in her mother; Migraines in her father.  Social History Patient  reports that she quit smoking about 5 weeks ago. Her smoking use included cigarettes. She has never used smokeless tobacco. She reports that she does not drink alcohol or use drugs.    Objective: Vitals:   03/05/18 1425  BP: 122/84  Pulse: 68  Temp: 98 F (36.7 C)  TempSrc: Oral  SpO2: 98%  Weight: 230 lb 6.4 oz (104.5 kg)  Height: 5\' 3"  (1.6 m)    Body mass index is 40.81 kg/m.  Physical Exam Vitals signs reviewed.  Constitutional:      Appearance: She is obese.  HENT:     Head: Normocephalic and atraumatic.     Right Ear: Tympanic membrane, ear canal and external ear normal.     Left Ear: Tympanic membrane, ear canal and external ear normal.     Nose: Nose normal.  Eyes:     Extraocular Movements: Extraocular movements intact.     Pupils: Pupils are equal, round, and reactive to light.  Cardiovascular:     Rate and Rhythm: Normal rate and regular rhythm.     Heart sounds: Normal heart sounds.  Pulmonary:     Effort: Pulmonary effort is normal.     Breath sounds: Normal breath  sounds.  Abdominal:     General: Abdomen is flat. Bowel sounds are normal.     Palpations: Abdomen is soft.  Skin:    Capillary Refill: Capillary refill takes less than 2 seconds.  Neurological:     General: No focal deficit present.     Mental Status: She is alert and oriented to person, place, and time.  Psychiatric:        Mood and Affect: Mood normal.        Behavior: Behavior normal.    Depression screen [facility] 2/9 03/05/2018 05/18/2017  Decreased Interest 3 1  Down, Depressed, Hopeless 3 0  PHQ - 2 Score 6 1  Altered sleeping 3 -  Tired, decreased energy 3 2  Change in appetite 3 0  Feeling bad or failure about yourself  3 0  Trouble concentrating 0 0  Moving slowly or fidgety/restless 0 0  Suicidal thoughts 0 0  PHQ-9 Score 18 -  Difficult doing work/chores Somewhat difficult -   GAD 7 : Generalized Anxiety Score 03/05/2018 05/18/2017  Nervous, Anxious, on Edge 3 0  Control/stop worrying 3 1  Worry too much - different things 3 0  Trouble relaxing 2 0  Restless 2 0  Easily annoyed or irritable 3 2  Afraid - awful might happen 3 0  Total GAD 7 Score 19 3  Anxiety Difficulty Somewhat difficult -        Assessment/plan: 1. Annual physical exam Checking all labs. Needs thyroid yearly with strong hx of Grave's disease. Encouraged diet/exericse and weight loss. Recommended tdap, she will think about this. utd on pap smear. Continue smoking cessation and MJ abstinence.  Patient counseling [x]    Nutrition: Stressed importance of moderation in sodium/caffeine intake, saturated fat and cholesterol, caloric balance, sufficient intake of fresh fruits, vegetables, fiber, calcium, iron, and 1 mg of folate supplement per day (for females capable of pregnancy).  [x]    Stressed the importance of regular exercise.   []    Substance Abuse: Discussed cessation/primary prevention of tobacco, alcohol, or other drug use; driving or other dangerous activities under the influence; availability  of treatment for abuse.   [x]    Injury prevention: Discussed safety belts, safety helmets, smoke detector, smoking near bedding or upholstery.   [x]    Sexuality: Discussed sexually transmitted diseases, partner selection, use of condoms, avoidance of unintended pregnancy  and contraceptive alternatives.  [x]    Dental health: Discussed importance of regular tooth brushing, flossing, and dental visits.  [x]    Health maintenance and immunizations reviewed. Please refer to Health maintenance section.    - CBC with Differential/Platelet; Future - Comprehensive metabolic panel; Future - Lipid panel; Future - TSH; Future - TSH - Lipid panel - Comprehensive metabolic panel - CBC with Differential/Platelet  2. Liver mass, left lobe Referral to GI and MRI ordered.  - Ambulatory  referral to Gastroenterology - MR LIVER W WO CONTRAST; Future  3. Anxiety and depression GAD 7 and phq9 scores are quite severe. We are going to start her on daily prozac to help these symptoms. Also hydroxyzine prn for panic attacks. Discussed they may make her drowsy. prozac side effects discussed including increased si/hi and if this happens to call 911 or go to ED. Will see her back in one month. Also may start counseling and encouraged her to start exercising.  - TSH; Future - VITAMIN D 25 Hydroxy (Vit-D Deficiency, Fractures); Future - VITAMIN D 25 Hydroxy (Vit-D Deficiency, Fractures) - TSH  4. Acute nonintractable headache, unspecified headache type Discussed need her to come back to discuss this. In the next month I want her to keep a headache log, go get her eyes checked and start magnesium daily. Also want her to try to take ibuprofen prn for headache instead of goodies. See her back in one month for f/u and to discuss this.   5. Syncope, near 2 isolated events. Er work up normal. She thinks due to lack of food/MJ/smoking. If happens again discussed we need to do monitor/echo and further cardiac work up.       Return in about 1 month (around 04/03/2018) for depression/headaches .   Orma Flaming, MD [place]   03/05/2018

## 2018-03-06 LAB — COMPREHENSIVE METABOLIC PANEL
AG RATIO: 1.4 (calc) (ref 1.0–2.5)
ALT: 15 U/L (ref 6–29)
AST: 15 U/L (ref 10–30)
Albumin: 3.9 g/dL (ref 3.6–5.1)
Alkaline phosphatase (APISO): 87 U/L (ref 31–125)
BILIRUBIN TOTAL: 0.3 mg/dL (ref 0.2–1.2)
BUN: 8 mg/dL (ref 7–25)
CO2: 24 mmol/L (ref 20–32)
Calcium: 8.9 mg/dL (ref 8.6–10.2)
Chloride: 107 mmol/L (ref 98–110)
Creat: 0.67 mg/dL (ref 0.50–1.10)
Globulin: 2.8 g/dL (calc) (ref 1.9–3.7)
Glucose, Bld: 88 mg/dL (ref 65–99)
Potassium: 4.1 mmol/L (ref 3.5–5.3)
Sodium: 138 mmol/L (ref 135–146)
Total Protein: 6.7 g/dL (ref 6.1–8.1)

## 2018-03-06 LAB — TSH: TSH: 0.01 mIU/L — ABNORMAL LOW

## 2018-03-06 LAB — VITAMIN D 25 HYDROXY (VIT D DEFICIENCY, FRACTURES): Vit D, 25-Hydroxy: 9 ng/mL — ABNORMAL LOW (ref 30–100)

## 2018-03-06 LAB — CBC WITH DIFFERENTIAL/PLATELET
Absolute Monocytes: 390 cells/uL (ref 200–950)
BASOS ABS: 0 {cells}/uL (ref 0–200)
Basophils Relative: 0 %
EOS PCT: 1.1 %
Eosinophils Absolute: 83 cells/uL (ref 15–500)
HEMATOCRIT: 36.3 % (ref 35.0–45.0)
Hemoglobin: 12.2 g/dL (ref 11.7–15.5)
Lymphs Abs: 2370 cells/uL (ref 850–3900)
MCH: 27.3 pg (ref 27.0–33.0)
MCHC: 33.6 g/dL (ref 32.0–36.0)
MCV: 81.2 fL (ref 80.0–100.0)
MPV: 10.2 fL (ref 7.5–12.5)
Monocytes Relative: 5.2 %
Neutro Abs: 4658 cells/uL (ref 1500–7800)
Neutrophils Relative %: 62.1 %
Platelets: 332 10*3/uL (ref 140–400)
RBC: 4.47 10*6/uL (ref 3.80–5.10)
RDW: 13.4 % (ref 11.0–15.0)
Total Lymphocyte: 31.6 %
WBC: 7.5 10*3/uL (ref 3.8–10.8)

## 2018-03-06 LAB — LIPID PANEL
CHOLESTEROL: 158 mg/dL (ref <200)
HDL: 39 mg/dL — ABNORMAL LOW (ref >=50)
LDL Cholesterol (Calc): 103 mg/dL (calc) — ABNORMAL HIGH
Non-HDL Cholesterol (Calc): 119 mg/dL (calc) (ref <130)
Total CHOL/HDL Ratio: 4.1 (calc) (ref <5.0)
Triglycerides: 72 mg/dL (ref <150)

## 2018-03-08 ENCOUNTER — Encounter: Payer: Self-pay | Admitting: Family Medicine

## 2018-03-08 ENCOUNTER — Other Ambulatory Visit: Payer: Self-pay | Admitting: Family Medicine

## 2018-03-08 DIAGNOSIS — E0590 Thyrotoxicosis, unspecified without thyrotoxic crisis or storm: Secondary | ICD-10-CM

## 2018-03-08 DIAGNOSIS — E559 Vitamin D deficiency, unspecified: Secondary | ICD-10-CM | POA: Insufficient documentation

## 2018-03-08 MED ORDER — VITAMIN D (ERGOCALCIFEROL) 1.25 MG (50000 UNIT) PO CAPS: ORAL_CAPSULE | ORAL | 0 refills | Status: DC

## 2018-03-10 ENCOUNTER — Encounter: Payer: Self-pay | Admitting: Gastroenterology

## 2018-03-11 ENCOUNTER — Ambulatory Visit: Payer: Self-pay

## 2018-03-11 ENCOUNTER — Telehealth: Payer: Self-pay | Admitting: Family Medicine

## 2018-03-11 MED ORDER — VITAMIN D 12.5 MCG/0.25ML PO LIQD: 3.0000 mL | Freq: Every day | ORAL | 3 refills | Status: DC

## 2018-03-11 NOTE — Telephone Encounter (Signed)
Will route to office for final disposition; pt seen by Dr Joya Gaskins, [place] 03/05/2018.

## 2018-03-11 NOTE — Telephone Encounter (Signed)
  Reason for Disposition . Caller has NON-URGENT medication question about med that PCP prescribed and triager unable to answer question  Protocols used: MEDICATION QUESTION CALL-A-AH  

## 2018-03-11 NOTE — Telephone Encounter (Signed)
Copied from [place] [id number]. Topic: Quick Communication - See Telephone Encounter >> Mar 11, 2018 10:57 AM Rutherford Nail, NT wrote: CRM for notification. See Telephone encounter for: 03/11/18.  See Nurse Triage Encounter from 03/11/2018.  Patient calling and would like a call regarding the nurse triage call from this morning. States it is about a medication. Nurse that she spoke with currently unavailable. Please advise. Would like a call betweek 11:35-12:20pm since she will be on lunch, if possible.

## 2018-03-11 NOTE — Telephone Encounter (Signed)
Sent in liquid form. Called and discussed with patient.

## 2018-03-11 NOTE — Telephone Encounter (Signed)
Left VM, did not discuss with patient. Could not add on t4 to lab work. Has appointment with dr. Dwyane Dee beginning of march so will let him draw more labs.

## 2018-03-11 NOTE — Telephone Encounter (Signed)
Please see msg and advise.  

## 2018-03-11 NOTE — Telephone Encounter (Addendum)
Patient called, left VM to return call to the office to discuss medication.  Message from Esaw Dace sent at 03/11/2018 9:13 AM EST   Summary: Medication Management   Pt called and stated she is not able to swallow her Vitamin D, Ergocalciferol, (DRISDOL) 1.25 MG (50000 UT) CAPS capsule pills. She tried several times and was unsuccessful. She wants to know what her other options are. Please advise. CB#[phone]. Okay to leave VM

## 2018-03-11 NOTE — Telephone Encounter (Signed)
Patient initial triage call routed to the office under the triage encounter. Let patient know the office will call her, if she returns the call. It has been routed to the provider.

## 2018-03-11 NOTE — Telephone Encounter (Signed)
See note

## 2018-03-11 NOTE — Addendum Note (Signed)
Addended by: Orma Flaming on: 03/11/2018 01:53 PM   Modules accepted: Orders

## 2018-03-15 ENCOUNTER — Other Ambulatory Visit: Payer: Self-pay | Admitting: *Deleted

## 2018-03-15 MED ORDER — VITAMIN D 12.5 MCG/0.25ML PO LIQD: 3.0000 mL | Freq: Every day | ORAL | 3 refills | Status: DC

## 2018-03-15 NOTE — Progress Notes (Signed)
Rx resent to pharmacy- per provider request

## 2018-03-15 NOTE — Telephone Encounter (Signed)
Call to pharmacy- verbal order given as prescribed by provider.

## 2018-03-15 NOTE — Telephone Encounter (Signed)
This liquid V-D was not called in last week. The pharmacy at least is saying they did not get it. Will you please call back in to [facility]

## 2018-03-18 ENCOUNTER — Encounter: Payer: Self-pay | Admitting: Family Medicine

## 2018-03-18 ENCOUNTER — Ambulatory Visit: Payer: PRIVATE HEALTH INSURANCE | Admitting: Family Medicine

## 2018-03-18 VITALS — BP 108/74 | HR 81 | Temp 98.6°F | Ht 63.0 in | Wt 226.6 lb

## 2018-03-18 DIAGNOSIS — K529 Noninfective gastroenteritis and colitis, unspecified: Secondary | ICD-10-CM

## 2018-03-18 LAB — POCT INFLUENZA A/B
INFLUENZA A, POC: NEGATIVE
Influenza B, POC: NEGATIVE

## 2018-03-18 MED ORDER — ONDANSETRON HCL 4 MG PO TABS: 4.0000 mg | ORAL_TABLET | Freq: Three times a day (TID) | ORAL | 0 refills | Status: DC | PRN

## 2018-03-18 NOTE — Progress Notes (Signed)
Patient: Misty Murillo MRN: [id number] DOB: 22-Sep-1990 PCP: Orma Flaming, MD     Subjective:  Chief Complaint  Patient presents with  . Headache  . Generalized Body Aches  . Fatigue    HPI: The patient is a 28 y.o. female who presents today for flu like symptoms. She started to feel bad around 10 AM. She had both vomiting and diarrhea and sent her home around 2:00pm. When she got home everything got worse. She had chills, sweats, body aches, migraines. She did check temperature and she did not have fever. She has not had cough or congestion. She is still having cramping. She is still having diarrhea. She has had about 10 episodes of diarrhea. She has only vomited once. Her son had a stomach bug last Sunday. Her son had both diarrhea and vomiting, but this lasted about 24 hours. She feels like her symptoms are worse. She is able to drink water and is urinating normally. No rashes anywhere. No blood in her diarrhea. She has not really eaten anything. She denies eating any raw meat/fish/fried rice. She feels like she is getting a little better.   Review of Systems  Constitutional: Positive for chills, fatigue and fever.  HENT: Negative for congestion, ear pain, postnasal drip, rhinorrhea, sinus pressure, sore throat and trouble swallowing.   Eyes: Negative for photophobia and pain.  Respiratory: Negative for cough and shortness of breath.   Cardiovascular: Negative for chest pain.  Gastrointestinal: Positive for abdominal pain. Negative for blood in stool, diarrhea, nausea and vomiting.  Musculoskeletal: Positive for back pain and myalgias. Negative for arthralgias, neck pain and neck stiffness.  Neurological: Positive for headaches. Negative for dizziness.  Hematological: Negative for adenopathy.  Psychiatric/Behavioral: Positive for sleep disturbance.    Allergies Patient has No Known Allergies.  Past Medical History Patient  has a past medical history of Liver mass and  Migraines.  Surgical History Patient  has a past surgical history that includes Resection liver and Cholecystectomy.  Family History Pateint's family history includes Cancer in an other family member; Diabetes in her mother; Migraines in her father.  Social History Patient  reports that she quit smoking about 7 weeks ago. Her smoking use included cigarettes. She has never used smokeless tobacco. She reports that she does not drink alcohol or use drugs.    Objective: Vitals:   03/18/18 1337  BP: 108/74  Pulse: 81  Temp: 98.6 F (37 C)  TempSrc: Oral  SpO2: 99%  Weight: 226 lb 9.6 oz (102.8 kg)  Height: 5\' 3"  (1.6 m)    Body mass index is 40.14 kg/m.  Physical Exam Vitals signs reviewed.  Constitutional:      Appearance: She is obese.  HENT:     Mouth/Throat:     Mouth: Mucous membranes are moist.     Pharynx: Oropharynx is clear.  Neck:     Musculoskeletal: Normal range of motion and neck supple.  Cardiovascular:     Rate and Rhythm: Normal rate and regular rhythm.     Heart sounds: Normal heart sounds.  Pulmonary:     Effort: Pulmonary effort is normal. No respiratory distress.     Breath sounds: Normal breath sounds. No wheezing or rales.  Abdominal:     Palpations: Abdomen is soft. There is no mass.     Tenderness: There is abdominal tenderness (diffuse). There is no guarding.     Comments: Hypoactive BS  Lymphadenopathy:     Cervical: No cervical adenopathy.  Skin:  General: Skin is warm and dry.     Capillary Refill: Capillary refill takes less than 2 seconds.  Neurological:     Mental Status: She is alert.  Psychiatric:        Mood and Affect: Mood normal.        Behavior: Behavior normal.    Flu: negative     Assessment/plan: 1. Gastroenteritis Appears to be viral in origin. Only 24 hours in. Would give this 3 days. ORS, fluids, BRAT diet. She is getting better with no diarrhea/vomiting since this AM. Dehydration precautions given as well as  fever/chills or worsening symptoms.  - POCT Influenza A/B    Return if symptoms worsen or fail to improve.   Orma Flaming, MD [place]   03/18/2018

## 2018-03-18 NOTE — Patient Instructions (Signed)
Viral Gastroenteritis, Adult    Viral gastroenteritis is also known as the stomach flu. This condition is caused by certain germs (viruses). These germs can be passed from person to person very easily (are very contagious). This condition can cause sudden watery poop (diarrhea), fever, and throwing up (vomiting).  Having watery poop and throwing up can make you feel weak and cause you to get dehydrated. Dehydration can make you tired and thirsty, make you have a dry mouth, and make it so you pee (urinate) less often. Older adults and people with other diseases or a weak defense system (immune system) are at higher risk for dehydration. It is important to replace the fluids that you lose from having watery poop and throwing up.  Follow these instructions at home:  Follow instructions from your doctor about how to care for yourself at home.  Eating and drinking  Follow these instructions as told by your doctor:   Take an oral rehydration solution (ORS). This is a drink that is sold at pharmacies and stores.   Drink clear fluids in small amounts as you are able, such as:  ? Water.  ? Ice chips.  ? Diluted fruit juice.  ? Low-calorie sports drinks.   Eat bland, easy-to-digest foods in small amounts as you are able, such as:  ? Bananas.  ? Applesauce.  ? Rice.  ? Low-fat (lean) meats.  ? Toast.  ? Crackers.   Avoid fluids that have a lot of sugar or caffeine in them.   Avoid alcohol.   Avoid spicy or fatty foods.  General instructions     Drink enough fluid to keep your pee (urine) clear or pale yellow.   Wash your hands often. If you cannot use soap and water, use hand sanitizer.   Make sure that all people in your home wash their hands well and often.   Rest at home while you get better.   Take over-the-counter and prescription medicines only as told by your doctor.   Watch your condition for any changes.   Take a warm bath to help with any burning or pain from having watery poop.   Keep all follow-up  visits as told by your doctor. This is important.  Contact a doctor if:   You cannot keep fluids down.   Your symptoms get worse.   You have new symptoms.   You feel light-headed or dizzy.   You have muscle cramps.  Get help right away if:   You have chest pain.   You feel very weak or you pass out (faint).   You see blood in your throw-up.   Your throw-up looks like coffee grounds.   You have bloody or black poop (stools) or poop that look like tar.   You have a very bad headache, a stiff neck, or both.   You have a rash.   You have very bad pain, cramping, or bloating in your belly (abdomen).   You have trouble breathing.   You are breathing very quickly.   Your heart is beating very quickly.   Your skin feels cold and clammy.   You feel confused.   You have pain when you pee.   You have signs of dehydration, such as:  ? Dark pee, hardly any pee, or no pee.  ? Cracked lips.  ? Dry mouth.  ? Sunken eyes.  ? Sleepiness.  ? Weakness.  This information is not intended to replace advice given to you by your   health care provider. Make sure you discuss any questions you have with your health care provider.  Document Released: 06/25/2007 Document Revised: 09/30/2017 Document Reviewed: 09/12/2014  Elsevier Interactive Patient Education  2019 [employer]

## 2018-03-23 ENCOUNTER — Telehealth: Payer: Self-pay

## 2018-03-23 NOTE — Telephone Encounter (Signed)
error 

## 2018-03-26 ENCOUNTER — Ambulatory Visit: Payer: Self-pay | Admitting: Endocrinology

## 2018-03-26 ENCOUNTER — Telehealth: Payer: Self-pay | Admitting: Family Medicine

## 2018-03-26 NOTE — Telephone Encounter (Signed)
Copied from [place] [id number]. Topic: Quick Communication - See Telephone Encounter >> Mar 26, 2018  3:51 PM Loma [place] wrote: CRM for notification. See Telephone encounter for: 03/26/18. PT is having an MRI done at work tomorrow, were she works and they have told her since she is trying to conceive that she needs to check with her dr, wanting a call back from Dr Shelby Mattocks nurse. [phone] Hopefully today.

## 2018-03-26 NOTE — Telephone Encounter (Signed)
Left message on voicemail to call office.  

## 2018-03-26 NOTE — Telephone Encounter (Signed)
See note

## 2018-03-27 ENCOUNTER — Other Ambulatory Visit: Payer: Self-pay

## 2018-03-29 NOTE — Telephone Encounter (Signed)
She may need urine pregnancy done today before MRI. Please also advise her I would wait on conceiving until after we fix her hyperthyroid or she at least meets with the endocrinologist.

## 2018-03-29 NOTE — Telephone Encounter (Signed)
Spoke to pt told her I tried to call Friday but unable to get you. Did you have MRI done? Pt said no she cancelled due to a cold and will reschedule. Told her okay Dr. Rogers Blocker said you need urine pregnancy test done prior to MRI. Also she advised you wait on conceiving until after she fixes your Hyperthyroid or you at least meet with the Endocrinologist. Pt verbalized understanding.

## 2018-03-29 NOTE — Telephone Encounter (Signed)
Please contact patient to advise of message as it contains clinical information.

## 2018-04-05 NOTE — Progress Notes (Deleted)
Patient: Misty Murillo MRN: [id number] DOB: 13-Nov-1990 PCP: Orma Flaming, MD     Subjective:  No chief complaint on file.   HPI: The patient is a 28 y.o. female who presents today for ***  Review of Systems  Allergies Patient has No Known Allergies.  Past Medical History Patient  has a past medical history of Anxiety, Depression, Headache, Liver mass, and Migraines.  Surgical History Patient  has a past surgical history that includes Resection liver and Cholecystectomy.  Family History Pateint's family history includes Cancer in an other family member; Diabetes in her mother; Migraines in her father.  Social History Patient  reports that she quit smoking about 2 months ago. Her smoking use included cigarettes. She has never used smokeless tobacco. She reports that she does not drink alcohol or use drugs.    Objective: There were no vitals filed for this visit.  There is no height or weight on file to calculate BMI.  Physical Exam     Assessment/plan:      No follow-ups on file.     @[facility]ME @ 04/05/2018

## 2018-04-07 ENCOUNTER — Telehealth: Payer: Self-pay | Admitting: Endocrinology

## 2018-04-07 ENCOUNTER — Other Ambulatory Visit: Payer: Self-pay

## 2018-04-07 ENCOUNTER — Ambulatory Visit (INDEPENDENT_AMBULATORY_CARE_PROVIDER_SITE_OTHER): Payer: PRIVATE HEALTH INSURANCE | Admitting: Endocrinology

## 2018-04-07 ENCOUNTER — Encounter: Payer: Self-pay | Admitting: Endocrinology

## 2018-04-07 VITALS — BP 122/68 | HR 76 | Temp 98.5°F | Ht 63.0 in | Wt 235.0 lb

## 2018-04-07 DIAGNOSIS — E0590 Thyrotoxicosis, unspecified without thyrotoxic crisis or storm: Secondary | ICD-10-CM | POA: Diagnosis not present

## 2018-04-07 NOTE — Progress Notes (Signed)
Patient ID: Misty Murillo, female   DOB: 03-Feb-1990, 28 y.o.   MRN: [id number]                                                                                                              Reason for Appointment: Probable hyperthyroidism, new consultation  Referring healthcare provider: Orma Flaming   Chief complaint: Tiredness   History of Present Illness:    The patient was being evaluated at her annual physical exam in 2/20 and her only complaint was anxiety and depression She was told that her thyroid test was abnormal and has been referred here for further evaluation  She did not complain of any symptoms of palpitations, shakiness, feeling excessively warm and sweaty, nervousnes.  She does get occasionally warm but only transiently and sometimes will feel cold also  Her weight has fluctuated and may have gained 5 pounds recently  She thinks her fatigue is more prominent in the last couple of weeks and she feels tired all the time  Wt Readings from Last 3 Encounters:  04/07/18 235 lb (106.6 kg)  03/18/18 226 lb 9.6 oz (102.8 kg)  03/05/18 230 lb 6.4 oz (104.5 kg)     Thyroid function tests as follows:     Lab Results  Component Value Date   TSH 0.01 (L) 03/05/2018    No results found for: THYROTRECAB   Allergies as of 04/07/2018   No Known Allergies     Medication List       Accurate as of April 07, 2018  4:13 PM. Always use your most recent med list.        acetaminophen 325 MG tablet Commonly known as:  TYLENOL Take 650 mg by mouth every 6 (six) hours as needed.   EPINEPHrine 0.3 mg/0.3 mL Soaj injection Commonly known as:  EPI-PEN Inject 0.3 mg into the muscle as needed (allergic reaction).   Vitamin D 12.5 MCG/0.25ML Liqd Take 3 mLs by mouth daily. For 12 weeks           Past Medical History:  Diagnosis Date  . Anxiety   . Depression   . Headache   . Liver mass   . Migraines     Past Surgical History:  Procedure Laterality Date  .  CHOLECYSTECTOMY    . RESECTION LIVER      Family History  Problem Relation Age of Onset  . Diabetes Mother   . Migraines Father   . Cancer Other     Social History:  reports that she quit smoking about 2 months ago. Her smoking use included cigarettes. She has never used smokeless tobacco. She reports that she does not drink alcohol or use drugs.  Allergies: No Known Allergies   Review of Systems  Constitutional: Positive for weight gain.  HENT:       Has periodic headaches  Respiratory: Negative for shortness of breath.   Cardiovascular: Negative for palpitations.       She is passed out in January and ER evaluation was negative.  Did have symptoms of dizziness and vertigo prior to this  Gastrointestinal: Positive for constipation.       Has had some constipation, better recently with improving her diet  Endocrine: Positive for cold intolerance and heat intolerance. Negative for menstrual changes.  Musculoskeletal: Negative for joint pain.  Skin:       She has had some dry skin and eczema  Neurological: Negative for weakness.  Psychiatric/Behavioral: Positive for nervousness.       Examination:   BP 122/68 (BP Location: Left Arm, Patient Position: Sitting, Cuff Size: Large)   Pulse 76   Temp 98.5 F (36.9 C) (Oral)   Ht 5\' 3"  (1.6 m)   Wt 235 lb (106.6 kg)   SpO2 98%   BMI 41.63 kg/m    General Appearance:  She has generalized obesity. Pleasant, not anxious or hyperkinetic.         Eyes: No abnormal prominence, lid lag or stare present.  No swelling of the eyelids   Neck: The thyroid is nonpalpable There is a fullness in the right lobe but no clear palpable nodule   There is no lymphadenopathy in the neck .           Heart: normal S1 and S2, no murmurs .          Lungs: breath sounds are normal bilaterally without added sounds  Abdomen: no hepatosplenomegaly or other palpable abnormality  Extremities: hands are warm. No ankle edema.  Neurological:  No  fine tremors are present. Deep tendon reflexes at biceps are appearing normal, better elicited on the left side.  Skin: No rash, abnormal thickening of the skin on the lower legs seen     Assessment/Plan:   Hyperthyroidism, mild and not associated with goiter  Unclear with that she has only subclinical hyperthyroidism or overt disease Her only symptoms are fatigue and nonspecific weight gain She does have a family history of hyperthyroidism but her mother has a toxic nodular goiter Thyroid exam is unremarkable without any overt signs of hyperthyroidism or tachycardia No eye signs to suggest Graves' disease  She has a suppressed TSH but free T4 and free T3 levels are not available  She will have a complete thyroid panel including T3 and T4 levels and also evaluate thyrotropin receptor antibody  Patient handout on hyperthyroidism given Patient understands the above discussion and treatment options. All questions were answered satisfactorily  Consult note sent to referring physician  Elayne Snare 04/07/2018, 4:13 PM    Note: This office note was prepared with Dragon voice recognition system technology. Any transcriptional errors that result from this process are unintentional.

## 2018-04-08 ENCOUNTER — Other Ambulatory Visit: Payer: Self-pay

## 2018-04-08 ENCOUNTER — Telehealth: Payer: Self-pay

## 2018-04-08 LAB — THYROTROPIN RECEPTOR AUTOABS: Thyrotropin Receptor Ab: 1.73 IU/L (ref 0.00–1.75)

## 2018-04-08 LAB — T4, FREE: Free T4: 1.05 ng/dL (ref 0.60–1.60)

## 2018-04-08 LAB — TSH: TSH: <0.01 u[IU]/mL — ABNORMAL LOW (ref 0.35–4.50)

## 2018-04-08 LAB — T3, FREE: T3, Free: 4.6 pg/mL — ABNORMAL HIGH (ref 2.3–4.2)

## 2018-04-08 MED ORDER — METHIMAZOLE 5 MG PO TABS: 5.0000 mg | ORAL_TABLET | Freq: Every day | ORAL | 3 refills | Status: DC

## 2018-04-08 NOTE — Telephone Encounter (Signed)
Please call her with the instructions from the result note.  She will need to be on methimazole and will need to absolutely avoid pregnancy for the next 6-9 months

## 2018-04-08 NOTE — Telephone Encounter (Signed)
Addressed in detail in result note under labs.

## 2018-04-08 NOTE — Telephone Encounter (Signed)
Patient called yesterday at close, and wanted to know if her thyroid problems can affect her ability to conceive, and her and her significant other are actively trying to conceive. If so, she would like to know how much her thyroid condition could possibly affect this.

## 2018-04-09 ENCOUNTER — Ambulatory Visit: Payer: PRIVATE HEALTH INSURANCE | Admitting: Family Medicine

## 2018-04-11 ENCOUNTER — Telehealth: Payer: Self-pay | Admitting: Gastroenterology

## 2018-04-11 ENCOUNTER — Telehealth: Payer: Self-pay

## 2018-04-11 NOTE — Telephone Encounter (Signed)
In an effort to slow community spread of COVID-19 by decreasing the number of people coming to the office, I am reviewing charts of my upcoming clinic visits.  When appropriate, I am offering other options including, but are not limited to:  -  Telephone visits -  Rescheduling in-person clinic visits, timing of which to be determined by each patient's clinical scenario and required social distancing and/or quarantine recommendations from [place] authorities.   After initial review of this patient's chart, I have decided:  Referred for follow up of liver mass resection, surgery for same in [facility] last year.  Not clear if needs GI at present vs seeing her surgeon  Please arrange telephone visit with me for 04/11/17 at 2:15pm.   Patient should be available by phone at that time and in a location where they can speak in privacy to protect their health information.

## 2018-04-11 NOTE — Telephone Encounter (Signed)
Spoke with Misty Murillo and let her know that Dr. Loletha Carrow will have a telephone visit tomorrow at 2:15pm due to covid-19. Misty Murillo verbalized understanding.

## 2018-04-12 ENCOUNTER — Telehealth (INDEPENDENT_AMBULATORY_CARE_PROVIDER_SITE_OTHER): Payer: PRIVATE HEALTH INSURANCE | Admitting: Gastroenterology

## 2018-04-12 ENCOUNTER — Other Ambulatory Visit: Payer: Self-pay

## 2018-04-12 DIAGNOSIS — K769 Liver disease, unspecified: Secondary | ICD-10-CM

## 2018-04-12 DIAGNOSIS — R160 Hepatomegaly, not elsewhere classified: Secondary | ICD-10-CM

## 2018-04-12 NOTE — Progress Notes (Signed)
2:20 PM - no answer on patient's mobile phone (only number listed for her)   ___________________________________________________________  2:48 PM patient was reached for telephone consultation.   This patient contacted our office requesting a physician telephone consultation regarding clinical questions and/or test results.  The patient and I were the only participants on the phone call, and the patient consented to phone consultation. I was present in my office, and the patient was on her telephone in her car.  Chief complaint: Follow-up liver lesion  Relevant history and results: Misty Murillo was referred to follow-up previous removal of [place] in the [facility] system.  Chart review was required, previous outpatient surgical notes reviewed and MRI reports. She was found to have 2 liver lesions on ultrasound last year, confirmed on MRI.  They were suspected to be either [place] or hepatic adenomas.  One of them was quite large near the porta hepatis in the central portion of the liver.  She underwent surgery last June with Dr. Heather Roberts.  The small lesion was removed and found to be FNH, the larger mass was left in place due to its size and reported adhesions and expected complexity of resection.  The plan at their July office visit was for the patient to follow-up with repeat MRI in 6 months and then see their clinic.  Misty Murillo feels well now, with no abdominal pain or other chronic digestive symptoms.  She was scheduled for the MRI after seeing her PCP, but since canceled it out of concerns for COVID-19.  She plans to reschedule in the near future.  Assessment and plan: Known FNH , one lesion removed and a much larger one remains, which her surgeon felt was best to observe.  It does not appear that Misty Murillo needs gastrointestinal testing at this time.  I recommended she contact her surgeon's office to schedule follow-up with them, and also to inquire about whether they have a preference for radiology location  and method of contrast, so the scans can then be best compared to the same radiology group. Jenise said she has Dr. Virgia Land contact information and will get in touch with them soon.  She was in agreement with this and appreciative of phone consultation.  Total encounter time: 18 minutes   Wilfrid Lund, MD

## 2018-04-12 NOTE — Telephone Encounter (Signed)
error 

## 2018-04-12 NOTE — Telephone Encounter (Signed)
Pt aware of phone visit and time.

## 2018-04-12 NOTE — Patient Instructions (Addendum)
Please make sure to follow up with Dr. Heather Roberts as previously suggested.  Make sure to reschedule your MRI to follow up on liver lesion as soon as deemed safe to resume normal daily activities from [facility] after Coronavirus pandemic.  It was a pleasure to speak with you today! Thank you for entrusting me with your care.  Dr. Loletha Carrow

## 2018-04-19 ENCOUNTER — Ambulatory Visit: Payer: Self-pay | Admitting: Endocrinology

## 2018-05-06 ENCOUNTER — Ambulatory Visit (INDEPENDENT_AMBULATORY_CARE_PROVIDER_SITE_OTHER): Payer: PRIVATE HEALTH INSURANCE | Admitting: Family Medicine

## 2018-05-06 ENCOUNTER — Encounter: Payer: Self-pay | Admitting: Family Medicine

## 2018-05-06 ENCOUNTER — Ambulatory Visit: Payer: Self-pay | Admitting: *Deleted

## 2018-05-06 ENCOUNTER — Other Ambulatory Visit: Payer: Self-pay

## 2018-05-06 ENCOUNTER — Telehealth: Payer: Self-pay

## 2018-05-06 ENCOUNTER — Other Ambulatory Visit (INDEPENDENT_AMBULATORY_CARE_PROVIDER_SITE_OTHER): Payer: PRIVATE HEALTH INSURANCE

## 2018-05-06 DIAGNOSIS — R197 Diarrhea, unspecified: Secondary | ICD-10-CM

## 2018-05-06 DIAGNOSIS — R1013 Epigastric pain: Secondary | ICD-10-CM

## 2018-05-06 MED ORDER — PANTOPRAZOLE SODIUM 40 MG PO TBEC: 40.0000 mg | DELAYED_RELEASE_TABLET | Freq: Every day | ORAL | 0 refills | Status: DC

## 2018-05-06 NOTE — Telephone Encounter (Signed)
Spoke to patient about coming in earlier for lab work.  Original appt at 4 pm; pt will come in at 3:30.

## 2018-05-06 NOTE — Telephone Encounter (Signed)
Maddy, schedule doxy appt to discuss further with Dr. Rogers Blocker.

## 2018-05-06 NOTE — Telephone Encounter (Signed)
Copied from [place] [phone]. Topic: General - Other >> May 06, 2018 10:45 AM Alanda Slim E wrote: Reason for CRM: Pt returned Jen's call for screening questions/ office number was busy, attempted several time/ please advise    Spoke to patient and verified all meds/allergies/ROS

## 2018-05-06 NOTE — Telephone Encounter (Signed)
Attempted to contact patient to review meds/allergies/ROS prior to 11 am doxy visit but there was n/a.  Left detailed voicemail message requesting a call back.

## 2018-05-06 NOTE — Telephone Encounter (Signed)
Patient has been scheduled for 05/06/18 @ 11am

## 2018-05-06 NOTE — Progress Notes (Signed)
Patient: Misty Murillo MRN: [id number] DOB: 09-Aug-1990 PCP: Orma Flaming, MD     I connected with [name] on 05/06/18 at 11:13am by a video enabled telemedicine application and verified that I am speaking with the correct person using two identifiers.  Location patient: Home Location provider: [facility], Office Persons participating in this virtual visit: [name] and Dr. Rogers Blocker   I discussed the limitations of evaluation and management by telemedicine and the availability of in person appointments. The patient expressed understanding and agreed to proceed.   Subjective:  Chief Complaint  Patient presents with  . Abdominal Pain    HPI: The patient is a 28 y.o. female who presents today for abdominal pain, nausea and diarrhea. She states her symptoms started about 1.5 weeks ago. She has epigastric pain and mainly diarrhea everytime she eats or drinks any kind of food or drink. She had one episode of vomiting yesterday, but no other time. She states everytime she eats or drinks she immediatly has stomach pain and has to run to bathroom and has diarrhea. She feels much better after she has BM. She has about 4-6 episodes of diarrhea/day. Pain is mainly in her epigastric area and rated a 7-8/10 with the food/drink only. She denies any NSAIDs, alcohol or travel. She does drink a lot of caffeine and is stressed. She does have a well water. No other person has symptoms at home. No fever/chills. She thinks she may have had blood in her stool once, but it may have been her period as well. She only had it happen once when she started her period. She has taken nothing over the counter to help with symptoms.   She is unsure if she still has her gallbladder. When she had her liver lesion resected last year, she thinks her report said they removed her gallbladder, but the surgeon never told her this was done.   We diagnosed her with hyperthyroid after I first met her and she has seen endocrinology  and started on medication. She feels so much better since she was started on medication. Has f/u with Dr. Dwyane Dee later on this month.   Review of Systems  Constitutional: Negative for chills, diaphoresis, fatigue and fever.  HENT: Negative.   Respiratory: Negative for cough and shortness of breath.   Cardiovascular: Negative for chest pain.  Gastrointestinal: Positive for abdominal distention, abdominal pain, diarrhea, nausea and vomiting. Negative for blood in stool.  Musculoskeletal: Positive for back pain. Negative for neck pain.  Neurological: Negative for dizziness and headaches.  Psychiatric/Behavioral: Positive for sleep disturbance.    Allergies Patient has No Known Allergies.  Past Medical History Patient  has a past medical history of Anxiety, Depression, Headache, Liver mass, and Migraines.  Surgical History Patient  has a past surgical history that includes Resection liver and Cholecystectomy.  Family History Pateint's family history includes Cancer in an other family member; Diabetes in her mother; Migraines in her father.  Social History Patient  reports that she quit smoking about 3 months ago. Her smoking use included cigarettes. She has never used smokeless tobacco. She reports that she does not drink alcohol or use drugs.    Objective: There were no vitals filed for this visit.  There is no height or weight on file to calculate BMI.  Physical Exam Vitals signs reviewed.  Constitutional:      Appearance: She is well-developed.  Pulmonary:     Effort: Pulmonary effort is normal.  Abdominal:  Comments: Points to epigastric area for area of pain   Neurological:     General: No focal deficit present.     Mental Status: She is alert and oriented to person, place, and time.  Psychiatric:        Mood and Affect: Mood normal.        Behavior: Behavior normal.        Assessment/plan: 1. Epigastric pain Large differential: gastritis, PUD, h.pylori, gi  bug, etc. Checking labs including h.pylori breath test, labs for gallbladder, etc. She will also look to see if she can find imaging s/p surgery to see if gallbladder was removed. Dehydration precautions given. If worsening symptoms, blood in stool, fever, inability to keep food down will need to go to ER. We are also going to start protonix daily to see if this helps as well. Diet changes discussed/decreasing stress etc..  - Comprehensive metabolic panel; Future - CBC with Differential/Platelet; Future - Lipase; Future - H. pylori breath test; Future  2. Diarrhea, unspecified type Large differential. Stool studies, crp/esr for IBD. I do not think this is her thyroid as she is doing so much better since started on medication. Dehydration precautions given. Will f/u with studies and if fever/blood in stool, worsening symptoms recommended she see ER.  - Stool culture; Future - Ova and parasite examination; Future - Fecal occult blood, imunochemical; Future - Fecal lactoferrin, quant; Future - Clostridium Difficile by PCR; Future - C-reactive protein; Future - Sedimentation rate; Future   Return if symptoms worsen or fail to improve.    Orma Flaming, MD [place]  05/06/2018

## 2018-05-06 NOTE — Addendum Note (Signed)
Addended by: Francis Dowse T on: 05/06/2018 03:00 PM   Modules accepted: Orders

## 2018-05-06 NOTE — Telephone Encounter (Signed)
Patient is having abdominal pain with loose stools- patient has had symptoms for 1 1/2 weeks. Patient has had nausea- she did vomit yesterday. ( one stool accompanied with small amount of blood- only time this week- patient can not remember the day) Patient states she has bloating when she eats or drinks and soon after has sharp pain that is sending her to the bathroom with loose stool. This has been going on for over 1 week. Patient states her symptoms are getting worse- she is starting to nauseous and she vomited yesterday. Patient did have GI appointment for liver lesions- but was told that needed to be followed by surgeon. Patient is aware she may need to be seen again by GI for this. Call to office- but [facility] # busy x3. Message sent for review and PCP advisement on follow up- patient will expect response. ( Patient is aware that ED may be recommendation- but she wants to avoid if possible- explained the reasons why she may have to go)  Reason for Disposition . [1] MILD-MODERATE pain AND [2] constant AND [3] present > 2 hours  Answer Assessment - Initial Assessment Questions 1. LOCATION: "Where does it hurt?"      Middle-top-center of abdomen 2. RADIATION: "Does the pain shoot anywhere else?" (e.g., chest, back)     Sometimes has bloating after eating- as of yesterday- she has increasing sharp pain in the back 3. ONSET: "When did the pain begin?" (e.g., minutes, hours or days ago)      1 1/2 weeks 4. SUDDEN: "Gradual or sudden onset?"     Sudden- with eating or drinking 5. PATTERN "Does the pain come and go, or is it constant?"    - If constant: "Is it getting better, staying the same, or worsening?"      (Note: Constant means the pain never goes away completely; most serious pain is constant and it progresses)     - If intermittent: "How long does it last?" "Do you have pain now?"     (Note: Intermittent means the pain goes away completely between bouts)     Comes and goes 6. SEVERITY: "How  bad is the pain?"  (e.g., Scale 1-10; mild, moderate, or severe)   - MILD (1-3): doesn't interfere with normal activities, abdomen soft and not tender to touch    - MODERATE (4-7): interferes with normal activities or awakens from sleep, tender to touch    - SEVERE (8-10): excruciating pain, doubled over, unable to do any normal activities      Sharp- moderate- 7-8 7. RECURRENT SYMPTOM: "Have you ever had this type of abdominal pain before?" If so, ask: "When was the last time?" and "What happened that time?"      Yes- patient had to have GI coctail 8. CAUSE: "What do you think is causing the abdominal pain?"     GI upset- not sure- happens every time she eat/drinks- 9 out of 10 times 9. RELIEVING/AGGRAVATING FACTORS: "What makes it better or worse?" (e.g., movement, antacids, bowel movement)     Having BM does make pain go away- not taking any medication 10. OTHER SYMPTOMS: "Has there been any vomiting, diarrhea, constipation, or urine problems?"       Nausea and vomiting, slight back pain 11. PREGNANCY: "Is there any chance you are pregnant?" "When was your last menstrual period?"       No, LMP- 04/30/18  Protocols used: ABDOMINAL PAIN - [facility]

## 2018-05-07 ENCOUNTER — Other Ambulatory Visit: Payer: Self-pay

## 2018-05-07 ENCOUNTER — Emergency Department (HOSPITAL_COMMUNITY): Payer: PRIVATE HEALTH INSURANCE

## 2018-05-07 ENCOUNTER — Encounter (HOSPITAL_COMMUNITY): Payer: Self-pay | Admitting: Emergency Medicine

## 2018-05-07 ENCOUNTER — Emergency Department (HOSPITAL_COMMUNITY)
Admission: EM | Admit: 2018-05-07 | Discharge: 2018-05-07 | Disposition: A | Discharge status: Home / Self Care | Payer: PRIVATE HEALTH INSURANCE | Attending: Emergency Medicine | Admitting: Emergency Medicine

## 2018-05-07 DIAGNOSIS — Z79899 Other long term (current) drug therapy: Secondary | ICD-10-CM | POA: Diagnosis not present

## 2018-05-07 DIAGNOSIS — R1011 Right upper quadrant pain: Secondary | ICD-10-CM | POA: Diagnosis present

## 2018-05-07 DIAGNOSIS — F17210 Nicotine dependence, cigarettes, uncomplicated: Secondary | ICD-10-CM | POA: Insufficient documentation

## 2018-05-07 DIAGNOSIS — K529 Noninfective gastroenteritis and colitis, unspecified: Secondary | ICD-10-CM | POA: Insufficient documentation

## 2018-05-07 LAB — CBC WITH DIFFERENTIAL/PLATELET
Abs Immature Granulocytes: 0.02 10*3/uL (ref 0.00–0.07)
Basophils Absolute: 0.1 10*3/uL (ref 0.0–0.1)
Basophils Absolute: 0 10*3/uL (ref 0.0–0.1)
Basophils Relative: 1 % (ref 0.0–3.0)
Basophils Relative: 0 %
Eosinophils Absolute: 0 10*3/uL (ref 0.0–0.7)
Eosinophils Absolute: 0 10*3/uL (ref 0.0–0.5)
Eosinophils Relative: 0.3 % (ref 0.0–5.0)
Eosinophils Relative: 0 %
HCT: 35.6 % — ABNORMAL LOW (ref 36.0–46.0)
HCT: 37.6 % (ref 36.0–46.0)
Hemoglobin: 12.3 g/dL (ref 12.0–15.0)
Hemoglobin: 12.7 g/dL (ref 12.0–15.0)
Immature Granulocytes: 0 %
Lymphocytes Relative: 30.1 % (ref 12.0–46.0)
Lymphocytes Relative: 32 %
Lymphs Abs: 2.3 10*3/uL (ref 0.7–4.0)
Lymphs Abs: 2.3 10*3/uL (ref 0.7–4.0)
MCH: 26.9 pg (ref 26.0–34.0)
MCHC: 34.5 g/dL (ref 30.0–36.0)
MCHC: 33.8 g/dL (ref 30.0–36.0)
MCV: 80.3 fl (ref 78.0–100.0)
MCV: 79.7 fL — ABNORMAL LOW (ref 80.0–100.0)
Monocytes Absolute: 0.4 10*3/uL (ref 0.1–1.0)
Monocytes Absolute: 0.4 10*3/uL (ref 0.1–1.0)
Monocytes Relative: 5 % (ref 3.0–12.0)
Monocytes Relative: 5 %
Neutro Abs: 4.8 10*3/uL (ref 1.4–7.7)
Neutro Abs: 4.5 10*3/uL (ref 1.7–7.7)
Neutrophils Relative %: 63.6 % (ref 43.0–77.0)
Neutrophils Relative %: 63 %
Platelets: 310 10*3/uL (ref 150.0–400.0)
Platelets: 280 10*3/uL (ref 150–400)
RBC: 4.44 Mil/uL (ref 3.87–5.11)
RBC: 4.72 MIL/uL (ref 3.87–5.11)
RDW: 14.3 % (ref 11.5–15.5)
RDW: 13.7 % (ref 11.5–15.5)
WBC: 7.6 10*3/uL (ref 4.0–10.5)
WBC: 7.2 10*3/uL (ref 4.0–10.5)
nRBC: 0 % (ref 0.0–0.2)

## 2018-05-07 LAB — COMPREHENSIVE METABOLIC PANEL
ALT: 14 U/L (ref 0–35)
ALT: 16 U/L (ref 0–44)
AST: 15 U/L (ref 0–37)
AST: 18 U/L (ref 15–41)
Albumin: 4 g/dL (ref 3.5–5.2)
Albumin: 4 g/dL (ref 3.5–5.0)
Alkaline Phosphatase: 95 U/L (ref 39–117)
Alkaline Phosphatase: 97 U/L (ref 38–126)
Anion gap: 8 (ref 5–15)
BUN: 8 mg/dL (ref 6–23)
BUN: 7 mg/dL (ref 6–20)
CO2: 28 mEq/L (ref 19–32)
CO2: 25 mmol/L (ref 22–32)
Calcium: 8.8 mg/dL (ref 8.4–10.5)
Calcium: 8.7 mg/dL — ABNORMAL LOW (ref 8.9–10.3)
Chloride: 104 mEq/L (ref 96–112)
Chloride: 103 mmol/L (ref 98–111)
Creatinine, Ser: 0.67 mg/dL (ref 0.40–1.20)
Creatinine, Ser: 0.61 mg/dL (ref 0.44–1.00)
GFR calc Af Amer: >60 mL/min (ref >60)
GFR calc non Af Amer: >60 mL/min (ref >60)
GFR: 128.2 mL/min (ref >60.00)
Glucose, Bld: 99 mg/dL (ref 70–99)
Glucose, Bld: 89 mg/dL (ref 70–99)
Potassium: 3.8 mEq/L (ref 3.5–5.1)
Potassium: 3.5 mmol/L (ref 3.5–5.1)
Sodium: 139 mEq/L (ref 135–145)
Sodium: 136 mmol/L (ref 135–145)
Total Bilirubin: 0.3 mg/dL (ref 0.2–1.2)
Total Bilirubin: 0.3 mg/dL (ref 0.3–1.2)
Total Protein: 6.9 g/dL (ref 6.0–8.3)
Total Protein: 7.9 g/dL (ref 6.5–8.1)

## 2018-05-07 LAB — URINALYSIS, ROUTINE W REFLEX MICROSCOPIC
Bilirubin Urine: NEGATIVE
Glucose, UA: NEGATIVE mg/dL
Hgb urine dipstick: NEGATIVE
Ketones, ur: NEGATIVE mg/dL
Leukocytes,Ua: NEGATIVE
Nitrite: NEGATIVE
Protein, ur: NEGATIVE mg/dL
Specific Gravity, Urine: 1.011 (ref 1.005–1.030)
pH: 7 (ref 5.0–8.0)

## 2018-05-07 LAB — LIPASE, BLOOD: Lipase: 17 U/L (ref 11–51)

## 2018-05-07 LAB — PREGNANCY, URINE: Preg Test, Ur: NEGATIVE

## 2018-05-07 LAB — SEDIMENTATION RATE: Sed Rate: 8 mm/hr (ref 0–20)

## 2018-05-07 LAB — LIPASE: Lipase: 5 U/L — ABNORMAL LOW (ref 11.0–59.0)

## 2018-05-07 LAB — C-REACTIVE PROTEIN: CRP: <1 mg/dL (ref 0.5–20.0)

## 2018-05-07 MED ORDER — ONDANSETRON 4 MG PO TBDP: 4.0000 mg | ORAL_TABLET | Freq: Three times a day (TID) | ORAL | 0 refills | Status: DC | PRN

## 2018-05-07 MED ORDER — SODIUM CHLORIDE 0.9 % IV BOLUS
1000.0000 mL | Freq: Once | INTRAVENOUS | Status: AC
  Administered 2018-05-07: 1000 mL via INTRAVENOUS

## 2018-05-07 MED ORDER — PANTOPRAZOLE SODIUM 40 MG PO TBEC
40.0000 mg | DELAYED_RELEASE_TABLET | Freq: Once | ORAL | Status: AC
  Administered 2018-05-07: 40 mg via ORAL
  Filled 2018-05-07: qty 1

## 2018-05-07 MED ORDER — LOPERAMIDE HCL 2 MG PO CAPS: 2.0000 mg | ORAL_CAPSULE | Freq: Four times a day (QID) | ORAL | 0 refills | Status: DC | PRN

## 2018-05-07 MED ORDER — ONDANSETRON HCL 4 MG/2ML IJ SOLN
4.0000 mg | Freq: Once | INTRAMUSCULAR | Status: AC
  Administered 2018-05-07: 4 mg via INTRAVENOUS
  Filled 2018-05-07: qty 2

## 2018-05-07 MED ORDER — IOHEXOL 300 MG/ML  SOLN
100.0000 mL | Freq: Once | INTRAMUSCULAR | Status: AC | PRN
  Administered 2018-05-07: 100 mL via INTRAVENOUS

## 2018-05-07 NOTE — Discharge Instructions (Signed)
Your labs and CT scan tonight are stable.  Your symptoms are probably a viral stomach flu.  It is possible for Covid 19 to include vomiting and diarrhea, however with no cough or shortness of breath and no fevers, this is less likely.  Regardless, it is recommended that you maintain home isolation for the next 7 days or until resolution of your symptoms. Use the medicines prescribed for nausea and vomiting and diarrhea.  Start the protonix your primary doctor recommended today (we gave you a dose for today here).  Make sure you are drinking plenty of fluids.  Plan to get rechecked for any worsening or persistent symptoms as discussed.

## 2018-05-07 NOTE — ED Notes (Signed)
Ginger Ale given to patient.

## 2018-05-07 NOTE — ED Provider Notes (Signed)
[facility] EMERGENCY DEPARTMENT Provider Note   CSN: [id number] Arrival date & time: 05/07/18  1639    History   Chief Complaint Chief Complaint  Patient presents with   Emesis    HPI Misty Murillo is a 28 y.o. 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  She reports any PO intake triggers an episode of pain, nausea and diarrhea which is non bloody.  She denies fevers, chills, sob, cough or upper respiratory sx but feel weak and dehydrated.  She reports possible exposure to coworker who was suspicious for covid (not tested, had sx initially of sinus like sx, progressed to n/v/d and chills).  Pt's past history is significant for a liver lesion x 2, one being surgically excised last year and found to be [place], the second being larger and too complex for resection,  which is being followed by general surgery [facility]).   She had an evisit with her pcp yesterday for her current sx, abdominal labs including H pylori and stool studies ordered by not yet collected (pt planning to collect stool sample in 2 days for delivery to the lab the next morning).  She was placed on protonix ytd (hasn't picked up yet). She has found no alleviators. No recent abx, no foreign travel, no other known exposures except for her coworker.    The history is provided by the patient.    Past Medical History:  Diagnosis Date   Anxiety    Depression    Headache    Liver mass    Migraines     Patient Active Problem List   Diagnosis Date Noted   Hyperthyroidism 03/08/2018   Vitamin D deficiency 03/08/2018   Sickle cell trait ([place]) 05/26/2017   Liver mass, left lobe 05/18/2017    Past Surgical History:  Procedure Laterality Date   CHOLECYSTECTOMY     RESECTION LIVER       OB History    Gravida  2   Para  1   Term  1   Preterm  0   AB  0   Living  1     SAB  0   TAB  0   Ectopic  0   Multiple  0   Live Births  1            Home Medications    Prior to Admission medications   Medication Sig Start Date End Date Taking? Authorizing Provider  acetaminophen (TYLENOL) 325 MG tablet Take 650 mg by mouth every 6 (six) hours as needed.   Yes [provider]  EPINEPHrine 0.3 mg/0.3 mL IJ SOAJ injection Inject 0.3 mg into the muscle as needed (allergic reaction).  02/16/17  Yes [provider]  methimazole (TAPAZOLE) 5 MG tablet Take 1 tablet (5 mg total) by mouth daily. TAKE 1 TABLET BY MOUTH ONCE DAILY. 04/08/18  Yes Elayne Snare, MD  Vitamin D 12.5 MCG/0.25ML LIQD Take 3 mLs by mouth daily. For 12 weeks 03/15/18  Yes Orma Flaming, MD  loperamide (IMODIUM) 2 MG capsule Take 1 capsule (2 mg total) by mouth 4 (four) times daily as needed for diarrhea or loose stools. 05/07/18   Aariz Maish, Almyra Free, PA-C  ondansetron (ZOFRAN ODT) 4 MG disintegrating tablet Take 1 tablet (4 mg total) by mouth every 8 (eight) hours as needed for nausea or vomiting. 05/07/18  Evalee Jefferson, PA-C  pantoprazole (PROTONIX) 40 MG tablet Take 1 tablet (40 mg total) by mouth daily. Patient not taking: Reported on 05/07/2018 05/06/18   Orma Flaming, MD    Family History Family History  Problem Relation Age of Onset   Diabetes Mother    Migraines Father    Cancer Other     Social History Social History   Tobacco Use   Smoking status: Current Every Day Smoker    Types: Cigarettes    Last attempt to quit: 01/27/2018    Years since quitting: 0.2   Smokeless tobacco: Never Used  Substance Use Topics   Alcohol use: No    Comment: rarely    Drug use: Yes    Types: Marijuana     Allergies   Patient has no known allergies.   Review of Systems Review of Systems  Constitutional: Positive for fatigue. Negative for chills and fever.  HENT: Negative for congestion and sore throat.   Eyes: Negative.   Respiratory: Negative for  cough, chest tightness and shortness of breath.   Cardiovascular: Negative for chest pain.  Gastrointestinal: Positive for abdominal pain, diarrhea, nausea and vomiting.  Genitourinary: Negative.   Musculoskeletal: Negative for arthralgias, joint swelling and neck pain.  Skin: Negative.  Negative for rash and wound.  Neurological: Positive for weakness. Negative for dizziness, numbness and headaches.  Psychiatric/Behavioral: Negative.      Physical Exam Updated Vital Signs BP 122/85 (BP Location: Left Arm)    Pulse 69    Temp 98.1 F (36.7 C) (Oral)    Resp 19    Ht 5\' 3"  (1.6 m)    Wt 106.6 kg    LMP 04/30/2018    SpO2 100%    BMI 41.63 kg/m   Physical Exam Vitals signs and nursing note reviewed.  Constitutional:      Appearance: She is well-developed.  HENT:     Head: Normocephalic and atraumatic.     Mouth/Throat:     Mouth: Mucous membranes are dry.  Eyes:     Conjunctiva/sclera: Conjunctivae normal.  Neck:     Musculoskeletal: Normal range of motion.  Cardiovascular:     Rate and Rhythm: Normal rate and regular rhythm.     Heart sounds: Normal heart sounds.  Pulmonary:     Effort: Pulmonary effort is normal.     Breath sounds: Normal breath sounds. No wheezing.  Abdominal:     General: Bowel sounds are normal. There is no distension.     Palpations: Abdomen is soft. There is no mass.     Tenderness: There is no abdominal tenderness. There is no guarding or rebound.     Comments: Well healed midline surgical incision.  No mass, no guarding.  No acute abd findings.  Musculoskeletal: Normal range of motion.  Skin:    General: Skin is warm and dry.  Neurological:     Mental Status: She is alert.      ED Treatments / Results  Labs (all labs ordered are listed, but only abnormal results are displayed) Labs Reviewed  CBC WITH DIFFERENTIAL/PLATELET - Abnormal; Notable for the following components:      Result Value   MCV 79.7 (*)    All other components within  normal limits  COMPREHENSIVE METABOLIC PANEL - Abnormal; Notable for the following components:   Calcium 8.7 (*)    All other components within normal limits  URINALYSIS, ROUTINE W REFLEX MICROSCOPIC  PREGNANCY, URINE  LIPASE, BLOOD  EKG None  Radiology Ct Abdomen Pelvis W Contrast  Result Date: 05/07/2018 CLINICAL DATA:  Nausea, vomiting for 1 week. Bilateral abdominal pain. EXAM: CT ABDOMEN AND PELVIS WITH CONTRAST TECHNIQUE: Multidetector CT imaging of the abdomen and pelvis was performed using the standard protocol following bolus administration of intravenous contrast. CONTRAST:  15mL OMNIPAQUE IOHEXOL 300 MG/ML  SOLN COMPARISON:  09/04/2017 CT abdomen 04/27/2017 MR abdomen FINDINGS: Lower chest: No acute abnormality. Hepatobiliary: Stable 9 x 11.7 cm mass in the medial segment of the left hepatic lobe. Previously visualized small right lower lobe liver mass is not identified. No other focal hepatic mass. No intrahepatic or extrahepatic biliary ductal dilatation. Prior cholecystectomy. Pancreas: Unremarkable. No pancreatic ductal dilatation or surrounding inflammatory changes. Spleen: Normal in size without focal abnormality. Adrenals/Urinary Tract: Adrenal glands are unremarkable. Kidneys are normal, without renal calculi, focal lesion, or hydronephrosis. Bladder is unremarkable. Stomach/Bowel: Stomach is within normal limits. Appendix appears normal. No evidence of bowel wall thickening, distention, or inflammatory changes. Vascular/Lymphatic: No significant vascular findings are present. No enlarged abdominal or pelvic lymph nodes. Reproductive: 3 cm left uterine mass most consistent with a uterine fibroid. No adnexal mass. Other: No abdominal wall hernia or abnormality. No abdominopelvic ascites. Musculoskeletal: No acute osseous abnormality. No aggressive osseous lesion. Degenerative disc disease with mild disc height loss at L5-S1. IMPRESSION: 1. No acute abdominal or pelvic pathology.  2. Stable 9 x 11.7 cm mass in the medial segment of the left hepatic lobe. Electronically Signed   By: Kathreen Devoid   On: 05/07/2018 20:55    Procedures Procedures (including critical care time)  Medications Ordered in ED Medications  sodium chloride 0.9 % bolus 1,000 mL (0 mLs Intravenous Stopped 05/07/18 2034)  ondansetron (ZOFRAN) injection 4 mg (4 mg Intravenous Given 05/07/18 1819)  iohexol (OMNIPAQUE) 300 MG/ML solution 100 mL (100 mLs Intravenous Contrast Given 05/07/18 1959)  pantoprazole (PROTONIX) EC tablet 40 mg (40 mg Oral Given 05/07/18 2120)     Initial Impression / Assessment and Plan / ED Course  I have reviewed the triage vital signs and the nursing notes.  Pertinent labs & imaging results that were available during my care of the patient were reviewed by me and considered in my medical decision making (see chart for details).        At re exam, no n/v/d.  Abd exam ttp epigastric and RUQ.  Given h/o known liver mass, will Ct scan to reassess any changes and possible source of todays sx.  CT imaging reviewed, stable liver lesion, doubt this is the source of todays sx, favoring gastroenteritis, viral.  Doubt covid, no fevers, but does endorse chills, no sob/resp complaints.  Zofran, imodium prn.  Rest, increased fluids.  Discussed home isolation x 7 days, following up with pcp or recheck here for any worsened sx.   Pt stable for home dc.  Misty Murillo was evaluated in Emergency Department on 05/07/2018 for the symptoms described in the history of present illness. She was evaluated in the context of the global COVID-19 pandemic, which necessitated consideration that the patient might be at risk for infection with the SARS-CoV-2 virus that causes COVID-19. Institutional protocols and algorithms that pertain to the evaluation of patients at risk for COVID-19 are in a state of rapid change based on information released by regulatory bodies including the CDC and federal and state  organizations. These policies and algorithms were followed during the patient's care in the ED.   Final Clinical Impressions(s) /  ED Diagnoses   Final diagnoses:  Gastroenteritis    ED Discharge Orders         Ordered    ondansetron (ZOFRAN ODT) 4 MG disintegrating tablet  Every 8 hours PRN     05/07/18 2118    loperamide (IMODIUM) 2 MG capsule  4 times daily PRN     05/07/18 2118           Evalee Jefferson, Hershal Coria 05/07/18 2122    Francine Graven, DO 05/09/18 2234

## 2018-05-07 NOTE — ED Triage Notes (Addendum)
Patient complaining of vomiting and diarrhea x 1 week. States she saw PCP yesterday for same. States today at work she had sudden onset of chills, back ache, and blurry vision. States coworker is out of work due to possible Covid-19.

## 2018-05-10 LAB — STOOL CULTURE

## 2018-05-10 LAB — OVA AND PARASITE EXAMINATION

## 2018-05-10 LAB — FECAL LACTOFERRIN, QUANT

## 2018-05-10 LAB — H. PYLORI BREATH TEST: H. pylori Breath Test: NOT DETECTED

## 2018-05-17 ENCOUNTER — Other Ambulatory Visit: Payer: Self-pay

## 2018-05-17 ENCOUNTER — Other Ambulatory Visit (INDEPENDENT_AMBULATORY_CARE_PROVIDER_SITE_OTHER): Payer: PRIVATE HEALTH INSURANCE

## 2018-05-17 DIAGNOSIS — E0590 Thyrotoxicosis, unspecified without thyrotoxic crisis or storm: Secondary | ICD-10-CM

## 2018-05-18 LAB — T4, FREE: Free T4: 0.77 ng/dL (ref 0.60–1.60)

## 2018-05-18 LAB — TSH: TSH: 0.02 u[IU]/mL — ABNORMAL LOW (ref 0.35–4.50)

## 2018-05-19 ENCOUNTER — Encounter: Payer: Self-pay | Admitting: Endocrinology

## 2018-05-19 ENCOUNTER — Other Ambulatory Visit: Payer: Self-pay | Admitting: Family Medicine

## 2018-05-19 ENCOUNTER — Ambulatory Visit (INDEPENDENT_AMBULATORY_CARE_PROVIDER_SITE_OTHER): Payer: PRIVATE HEALTH INSURANCE | Admitting: Endocrinology

## 2018-05-19 ENCOUNTER — Other Ambulatory Visit: Payer: Self-pay

## 2018-05-19 ENCOUNTER — Other Ambulatory Visit (INDEPENDENT_AMBULATORY_CARE_PROVIDER_SITE_OTHER): Payer: PRIVATE HEALTH INSURANCE

## 2018-05-19 DIAGNOSIS — R197 Diarrhea, unspecified: Secondary | ICD-10-CM | POA: Diagnosis not present

## 2018-05-19 DIAGNOSIS — E0590 Thyrotoxicosis, unspecified without thyrotoxic crisis or storm: Secondary | ICD-10-CM

## 2018-05-19 LAB — FECAL OCCULT BLOOD, IMMUNOCHEMICAL: Fecal Occult Bld: NEGATIVE

## 2018-05-19 NOTE — Progress Notes (Signed)
Patient ID: Misty Murillo, female   DOB: January 08, 1991, 28 y.o.   MRN: [id number]                                                                                                                     Reason for Appointment: Probable hyperthyroidism, follow-up visit    Today's office visit was provided via telemedicine using video technique Explained to the patient and the the limitations of evaluation and management by telemedicine and the availability of in person appointments.  The patient understood the limitations and agreed to proceed. Patient also understood that the telehealth visit is billable. . Location of the patient: Home . Location of the provider: Office Only the patient and myself were participating in the encounter   Referring healthcare provider: Orma Flaming   Chief complaint: Follow-up of thyroid   History of Present Illness:    The patient was being evaluated at her annual physical exam in 2/20 symptoms of fatigue, anxiety and depression  She was seen in consultation in 03/2018 On her exam she had no abnormal findings and no goiter  However her free T3 was high at 4.6 Also TSH was again undetectable  Since her thyrotropin receptor antibody was high normal she was started on methimazole 5 mg daily She has no side effects from this She does feel much better with her energy level She appears to have gained back some weight lately Also symptoms of anxiety and depression are much better  Thyroid levels improving with TSH not completely suppressed and free T4 normal  Wt Readings from Last 3 Encounters:  05/07/18 235 lb (106.6 kg)  04/07/18 235 lb (106.6 kg)  03/18/18 226 lb 9.6 oz (102.8 kg)     Thyroid function tests as follows:     Lab Results  Component Value Date   FREET4 0.77 05/17/2018   FREET4 1.05 04/07/2018   T3FREE 4.6 (H) 04/07/2018   TSH 0.02 (L) 05/17/2018   TSH <0.01 (L) 04/07/2018   TSH 0.01 (L) 03/05/2018    Lab Results  Component  Value Date   THYROTRECAB 1.73 04/07/2018     Allergies as of 05/19/2018   No Known Allergies     Medication List       Accurate as of May 19, 2018  2:46 PM. Always use your most recent med list.        acetaminophen 325 MG tablet Commonly known as:  TYLENOL Take 650 mg by mouth every 6 (six) hours as needed.   EPINEPHrine 0.3 mg/0.3 mL Soaj injection Commonly known as:  EPI-PEN Inject 0.3 mg into the muscle as needed (allergic reaction).   loperamide 2 MG capsule Commonly known as:  IMODIUM Take 1 capsule (2 mg total) by mouth 4 (four) times daily as needed for diarrhea or loose stools.   methimazole 5 MG tablet Commonly known as:  TAPAZOLE Take 1 tablet (5 mg total) by mouth daily. TAKE 1 TABLET BY MOUTH ONCE DAILY.   ondansetron 4 MG  disintegrating tablet Commonly known as:  Zofran ODT Take 1 tablet (4 mg total) by mouth every 8 (eight) hours as needed for nausea or vomiting.   Vitamin D 12.5 MCG/0.25ML Liqd Take 3 mLs by mouth daily. For 12 weeks           Past Medical History:  Diagnosis Date  . Anxiety   . Depression   . Headache   . Liver mass   . Migraines     Past Surgical History:  Procedure Laterality Date  . CHOLECYSTECTOMY    . RESECTION LIVER      Family History  Problem Relation Age of Onset  . Diabetes Mother   . Migraines Father   . Cancer Other     Social History:  reports that she has been smoking cigarettes. She has never used smokeless tobacco. She reports current drug use. Drug: Marijuana. She reports that she does not drink alcohol.  Allergies: No Known Allergies   Review of Systems     Examination:   LMP 04/30/2018    General Appearance:  She has generalized obesity. Pleasant, not hyperkinetic No tremor seen         Assessment/Plan:   Hyperthyroidism, mild and likely to be from mild Graves' disease especially with her family history being positive  She is doing subjectively much better with 5 mg of  methimazole no side effects Thyroid levels are improving as seen with her free T4 level TSH may take longer to normalize  She will continue on 5 mg of methimazole Needs to follow-up in about 6 weeks to ensure she is on a stable dose Likely will require treatment for a few months up to a year to get into remission    Elayne Snare 05/19/2018, 2:46 PM    Note: This office note was prepared with Dragon voice recognition system technology. Any transcriptional errors that result from this process are unintentional.

## 2018-05-21 ENCOUNTER — Telehealth: Payer: Self-pay | Admitting: Family Medicine

## 2018-05-21 ENCOUNTER — Encounter: Payer: Self-pay | Admitting: Family Medicine

## 2018-05-21 ENCOUNTER — Ambulatory Visit (INDEPENDENT_AMBULATORY_CARE_PROVIDER_SITE_OTHER): Payer: PRIVATE HEALTH INSURANCE | Admitting: Family Medicine

## 2018-05-21 VITALS — Ht 63.0 in | Wt 235.0 lb

## 2018-05-21 DIAGNOSIS — J069 Acute upper respiratory infection, unspecified: Secondary | ICD-10-CM

## 2018-05-21 MED ORDER — GUAIFENESIN-CODEINE 100-10 MG/5ML PO SYRP: 10.0000 mL | ORAL_SOLUTION | Freq: Three times a day (TID) | ORAL | 0 refills | Status: DC | PRN

## 2018-05-21 MED ORDER — BENZONATATE 200 MG PO CAPS: 200.0000 mg | ORAL_CAPSULE | Freq: Three times a day (TID) | ORAL | 0 refills | Status: DC | PRN

## 2018-05-21 MED ORDER — FLUTICASONE PROPIONATE 50 MCG/ACT NA SUSP: 2.0000 | Freq: Every day | NASAL | 6 refills | Status: DC

## 2018-05-21 NOTE — Telephone Encounter (Signed)
Called and spoke to patient.  She actually stated during conversation that she had spoke with her employer since calling in earlier this morning.  Her employer is nervous since this is her second time being sick in the last few wks and they want her to home quarantine for 14 days to include the wknd.  They need a note stating that she will be cleared to return to work on 5/15.

## 2018-05-21 NOTE — Telephone Encounter (Signed)
Copied from [facility] [id number]. Topic: Quick Communication - See Telephone Encounter >> May 21, 2018 10:05 AM Berneta Levins wrote: CRM for notification. See Telephone encounter for: 05/21/18.  Pt states she was told at virtual visit today to go to [facility] to get tested for [facility] virus.  Pt states that she called and they are not doing testing.  Pt states that she was directed to the [facility] virus hotline and was told she doesn't met criteria for testing.  Pt wants to know if Dr. Rogers Blocker will write her a note to go back to work on Monday since no one feels she needs the test. Pt can be reached at [phone]

## 2018-05-21 NOTE — Progress Notes (Signed)
Patient: Misty Murillo MRN: [id number] DOB: 05-05-1990 PCP: Orma Flaming, MD     I connected with [name] on 05/21/18 at 9:20am by a video enabled telemedicine application and verified that I am speaking with the correct person using two identifiers.  Location patient: Home Location provider: [facility], Office Persons participating in this virtual visit: [name] and Dr. Rogers Blocker   I discussed the limitations of evaluation and management by telemedicine and the availability of in person appointments. The patient expressed understanding and agreed to proceed.   Subjective:  Chief Complaint  Patient presents with  . Cough  . Sore Throat    HPI: The patient is a 28 y.o. female who presents today for cough and sore throat. She had diarrhea last week, but this has nearly resolved. Her stool studies are still pending, but her blood/c.diff have resulted negative. Last Friday (one week ago) she started with a cough and it has progressed with a pressure and pain in her chest. She also started to get a sore throat that feels like strep throat and some ear pain. She denies any nasal congestion/sinus pain/pressure. She only has shortness of breath with coughing spells. Her cough has mild production, but is normally dry. She has been drinking tea and taking mucinex. She has had no fever/chills, headaches or body aches. No known sick contacts. Was at home x 7 days self quarantining following bout of diarrhea and has been back at work x 1 week.   Review of Systems  Constitutional: Positive for fatigue.  HENT: Positive for ear pain, postnasal drip, rhinorrhea and sore throat. Negative for congestion, sinus pressure, sinus pain and trouble swallowing.   Respiratory: Positive for cough, chest tightness and shortness of breath (only with coughing spells ).   Gastrointestinal: Positive for abdominal pain, nausea and vomiting.  Neurological: Negative for dizziness and headaches.     Allergies Patient has No Known Allergies.  Past Medical History Patient  has a past medical history of Anxiety, Depression, Headache, Liver mass, and Migraines.  Surgical History Patient  has a past surgical history that includes Resection liver and Cholecystectomy.  Family History Pateint's family history includes Cancer in an other family member; Diabetes in her mother; Hyperthyroidism in her brother and mother; Migraines in her father.  Social History Patient  reports that she has been smoking cigarettes. She has never used smokeless tobacco. She reports current drug use. Drug: Marijuana. She reports that she does not drink alcohol.    Objective: Vitals:   05/21/18 0845  Weight: 235 lb (106.6 kg)  Height: 5\' 3"  (1.6 m)    Body mass index is 41.63 kg/m.  Physical Exam Vitals signs reviewed.  Constitutional:      Appearance: She is well-developed.  HENT:     Head: Normocephalic and atraumatic.  Pulmonary:     Effort: Pulmonary effort is normal.  Neurological:     General: No focal deficit present.     Mental Status: She is alert and oriented to person, place, and time.  Psychiatric:        Mood and Affect: Mood normal.        Behavior: Behavior normal.        Assessment/plan: 1. URI, acute She has symptoms that are the covid list; however, I feel like this is just a viral URI. WE are going to see if we can get her tested at a local urgent care for covid and self quarantine over the weekend. Treating her  viral cold with flonase for post nasal drip, honey and cool mist humidifier. Also sending in tessalon pearls prn and codeine cough syrup prn. Drowsy precautions given. Strict ER precautions given for worsening shortness of breath or fever.   Will need work note on Monday. Will let us know..  Return if symptoms worsen or fail to improve.     Orma Flaming, MD [place]  05/21/2018

## 2018-05-21 NOTE — Telephone Encounter (Signed)
See note

## 2018-05-25 LAB — OVA AND PARASITE EXAMINATION
CONCENTRATE RESULT:: NONE SEEN
MICRO NUMBER:: 432013
SPECIMEN QUALITY:: ADEQUATE
TRICHROME RESULT:: NONE SEEN

## 2018-05-25 LAB — STOOL CULTURE
MICRO NUMBER:: 434045
MICRO NUMBER:: 434046
MICRO NUMBER:: 434048
SHIGA RESULT:: NOT DETECTED
SPECIMEN QUALITY:: ADEQUATE
SPECIMEN QUALITY:: ADEQUATE
SPECIMEN QUALITY:: ADEQUATE

## 2018-05-25 LAB — GASTROINTESTINAL PATHOGEN PANEL PCR
C. difficile Tox A/B, PCR: NOT DETECTED
Campylobacter, PCR: NOT DETECTED
Cryptosporidium, PCR: NOT DETECTED
E coli (ETEC) LT/ST PCR: NOT DETECTED
E coli (STEC) stx1/stx2, PCR: NOT DETECTED
E coli 0157, PCR: NOT DETECTED
Giardia lamblia, PCR: NOT DETECTED
Norovirus, PCR: NOT DETECTED
Rotavirus A, PCR: NOT DETECTED
Salmonella, PCR: NOT DETECTED
Shigella, PCR: NOT DETECTED

## 2018-05-25 LAB — FECAL LACTOFERRIN, QUANT
Fecal Lactoferrin: NEGATIVE
MICRO NUMBER:: 434047
SPECIMEN QUALITY:: ADEQUATE

## 2018-05-25 LAB — CLOSTRIDIUM DIFFICILE TOXIN B, QUALITATIVE, REAL-TIME PCR: Toxigenic C. Difficile by PCR: NOT DETECTED

## 2018-07-21 ENCOUNTER — Telehealth: Payer: Self-pay

## 2018-07-21 ENCOUNTER — Encounter: Payer: Self-pay | Admitting: Physician Assistant

## 2018-07-21 ENCOUNTER — Ambulatory Visit (INDEPENDENT_AMBULATORY_CARE_PROVIDER_SITE_OTHER): Payer: PRIVATE HEALTH INSURANCE | Admitting: Physician Assistant

## 2018-07-21 VITALS — Temp 96.7°F | Ht 63.0 in

## 2018-07-21 DIAGNOSIS — R110 Nausea: Secondary | ICD-10-CM

## 2018-07-21 MED ORDER — ONDANSETRON 4 MG PO TBDP: 4.0000 mg | ORAL_TABLET | Freq: Three times a day (TID) | ORAL | 0 refills | Status: DC | PRN

## 2018-07-21 NOTE — Progress Notes (Signed)
Virtual Visit via Video   I connected with Misty Murillo on 07/21/18 at  4:00 PM EDT by a video enabled telemedicine application and verified that I am speaking with the correct person using two identifiers. Location patient: Home Location provider: [facility], Office Persons participating in the virtual visit: Misty Murillo, [name] PA-C  I discussed the limitations of evaluation and management by telemedicine and the availability of in person appointments. The patient expressed understanding and agreed to proceed.  Subjective:   HPI:   Nausea She started having n/v on Friday. On Saturday started period her symptoms eased up but then the gradually returned. She is now having nausea that is limiting her eating and drinking. She is not having any further vomiting. She does not have zofran. She is sexually active with one partner and does not have concerns for STDs. She does not use any form of contraception.   She denies: abdominal pain, pelvic pain, abnormal vaginal discharge, fever, chills, night sweats, dysuria, back pain, flank pain, blood in emesis, diarrhea   ROS: See pertinent positives and negatives per HPI.  Patient Active Problem List   Diagnosis Date Noted  . Hyperthyroidism 03/08/2018  . Vitamin D deficiency 03/08/2018  . Sickle cell trait ([place]) 05/26/2017  . Liver mass, left lobe 05/18/2017    Social History   Tobacco Use  . Smoking status: Current Every Day Smoker    Types: Cigarettes    Last attempt to quit: 01/27/2018    Years since quitting: 0.4  . Smokeless tobacco: Never Used  Substance Use Topics  . Alcohol use: No    Comment: rarely     Current Outpatient Medications:  .  acetaminophen (TYLENOL) 325 MG tablet, Take 650 mg by mouth every 6 (six) hours as needed., Disp: , Rfl:  .  EPINEPHrine 0.3 mg/0.3 mL IJ SOAJ injection, Inject 0.3 mg into the muscle as needed (allergic reaction). , Disp: , Rfl: 1 .  fluticasone (FLONASE) 50 MCG/ACT nasal  spray, Place 2 sprays into both nostrils daily., Disp: 16 g, Rfl: 6 .  methimazole (TAPAZOLE) 5 MG tablet, Take 1 tablet (5 mg total) by mouth daily. TAKE 1 TABLET BY MOUTH ONCE DAILY., Disp: 30 tablet, Rfl: 3 .  Vitamin D 12.5 MCG/0.25ML LIQD, Take 3 mLs by mouth daily. For 12 weeks, Disp: 84 mL, Rfl: 3 .  benzonatate (TESSALON) 200 MG capsule, Take 1 capsule (200 mg total) by mouth 3 (three) times daily as needed. (Patient not taking: Reported on 07/21/2018), Disp: 30 capsule, Rfl: 0 .  loperamide (IMODIUM) 2 MG capsule, Take 1 capsule (2 mg total) by mouth 4 (four) times daily as needed for diarrhea or loose stools., Disp: 12 capsule, Rfl: 0 .  ondansetron (ZOFRAN ODT) 4 MG disintegrating tablet, Take 1 tablet (4 mg total) by mouth every 8 (eight) hours as needed for nausea or vomiting., Disp: 20 tablet, Rfl: 0  No Known Allergies  Objective:   VITALS: Per patient if applicable, see vitals. GENERAL: Alert, appears well and in no acute distress. HEENT: Atraumatic, conjunctiva clear, no obvious abnormalities on inspection of external nose and ears. NECK: Normal movements of the head and neck. CARDIOPULMONARY: No increased WOB. Speaking in clear sentences. I:E ratio WNL.  MS: Moves all visible extremities without noticeable abnormality. PSYCH: Pleasant and cooperative, well-groomed. Speech normal rate and rhythm. Affect is appropriate. Insight and judgement are appropriate. Attention is focused, linear, and appropriate.  NEURO: CN grossly intact. Oriented as arrived  to appointment on time with no prompting. Moves both UE equally.  SKIN: No obvious lesions, wounds, erythema, or cyanosis noted on face or hands.  Assessment and Plan:   [name] was seen today for emesis.  Diagnoses and all orders for this visit:  Nausea Unclear etiology. No red flags on discussion. I did discuss differential dx, including pregnancy, infection, gastroenteritis, uncontrolled thyroid, among others. It does appear  that she was lost to follow-up with Dr. Dwyane Dee. I recommended that she take a urine pregnancy test. If negative, may take zofran. If symptoms persist, may get labs to assess further. I do want her to have her thyroid reassessed as directed by endo. If urine pregnancy positive, I instructed her to contact ob-gyn.  Other orders -     ondansetron (ZOFRAN ODT) 4 MG disintegrating tablet; Take 1 tablet (4 mg total) by mouth every 8 (eight) hours as needed for nausea or vomiting.   . Reviewed expectations re: course of current medical issues. . Discussed self-management of symptoms. . Outlined signs and symptoms indicating need for more acute intervention. . Patient verbalized understanding and all questions were answered. Marland Kitchen Health Maintenance issues including appropriate healthy diet, exercise, and smoking avoidance were discussed with patient. . See orders for this visit as documented in the electronic medical record.  I discussed the assessment and treatment plan with the patient. The patient was provided an opportunity to ask questions and all were answered. The patient agreed with the plan and demonstrated an understanding of the instructions.   The patient was advised to call back or seek an in-person evaluation if the symptoms worsen or if the condition fails to improve as anticipated.  [place], [place] 07/21/2018

## 2018-07-21 NOTE — Telephone Encounter (Signed)
Left detailed voicemail message on patient's cell phone regarding pre-visit screening.  Requested a c/b from patient.

## 2018-07-22 ENCOUNTER — Other Ambulatory Visit: Payer: Self-pay

## 2018-07-22 ENCOUNTER — Telehealth: Payer: Self-pay | Admitting: Physician Assistant

## 2018-07-22 ENCOUNTER — Telehealth: Payer: Self-pay

## 2018-07-22 ENCOUNTER — Other Ambulatory Visit: Payer: Self-pay | Admitting: Physician Assistant

## 2018-07-22 ENCOUNTER — Encounter: Payer: Self-pay | Admitting: Physician Assistant

## 2018-07-22 ENCOUNTER — Other Ambulatory Visit (INDEPENDENT_AMBULATORY_CARE_PROVIDER_SITE_OTHER): Payer: PRIVATE HEALTH INSURANCE

## 2018-07-22 ENCOUNTER — Encounter: Payer: Self-pay | Admitting: Family Medicine

## 2018-07-22 DIAGNOSIS — R110 Nausea: Secondary | ICD-10-CM | POA: Diagnosis not present

## 2018-07-22 DIAGNOSIS — E0590 Thyrotoxicosis, unspecified without thyrotoxic crisis or storm: Secondary | ICD-10-CM | POA: Diagnosis not present

## 2018-07-22 LAB — COMPREHENSIVE METABOLIC PANEL
ALT: 12 U/L (ref 0–35)
AST: 14 U/L (ref 0–37)
Albumin: 4.1 g/dL (ref 3.5–5.2)
Alkaline Phosphatase: 91 U/L (ref 39–117)
BUN: 7 mg/dL (ref 6–23)
CO2: 28 mEq/L (ref 19–32)
Calcium: 8.7 mg/dL (ref 8.4–10.5)
Chloride: 103 mEq/L (ref 96–112)
Creatinine, Ser: 0.81 mg/dL (ref 0.40–1.20)
GFR: 102.83 mL/min (ref >60.00)
Glucose, Bld: 87 mg/dL (ref 70–99)
Potassium: 3.8 mEq/L (ref 3.5–5.1)
Sodium: 139 mEq/L (ref 135–145)
Total Bilirubin: 0.4 mg/dL (ref 0.2–1.2)
Total Protein: 7.2 g/dL (ref 6.0–8.3)

## 2018-07-22 LAB — CBC WITH DIFFERENTIAL/PLATELET
Basophils Absolute: 0 10*3/uL (ref 0.0–0.1)
Basophils Relative: 0.1 % (ref 0.0–3.0)
Eosinophils Absolute: 0 10*3/uL (ref 0.0–0.7)
Eosinophils Relative: 0 % (ref 0.0–5.0)
HCT: 35.6 % — ABNORMAL LOW (ref 36.0–46.0)
Hemoglobin: 12.1 g/dL (ref 12.0–15.0)
Lymphocytes Relative: 27.4 % (ref 12.0–46.0)
Lymphs Abs: 2 10*3/uL (ref 0.7–4.0)
MCHC: 33.9 g/dL (ref 30.0–36.0)
MCV: 82.2 fl (ref 78.0–100.0)
Monocytes Absolute: 0.4 10*3/uL (ref 0.1–1.0)
Monocytes Relative: 5.8 % (ref 3.0–12.0)
Neutro Abs: 5 10*3/uL (ref 1.4–7.7)
Neutrophils Relative %: 66.7 % (ref 43.0–77.0)
Platelets: 317 10*3/uL (ref 150.0–400.0)
RBC: 4.33 Mil/uL (ref 3.87–5.11)
RDW: 14.9 % (ref 11.5–15.5)
WBC: 7.5 10*3/uL (ref 4.0–10.5)

## 2018-07-22 LAB — HCG, QUANTITATIVE, PREGNANCY: Quantitative HCG: <0.6 m[IU]/mL

## 2018-07-22 LAB — TSH: TSH: 1.14 u[IU]/mL (ref 0.35–4.50)

## 2018-07-22 LAB — T4, FREE: Free T4: 0.72 ng/dL (ref 0.60–1.60)

## 2018-07-22 LAB — T3, FREE: T3, Free: 3.7 pg/mL (ref 2.3–4.2)

## 2018-07-22 NOTE — Telephone Encounter (Signed)
Left detailed voicemail message on patient's cell phone requesting a call back.  Advised patient that I would prefer to speak directly to her instead of leaving a lengthy message on her voicemail.

## 2018-07-22 NOTE — Telephone Encounter (Signed)
Spoke with patient and she states that she hasn't started taking the Zofran yet.  She was waiting until she reported the negative UP result to [facility].  I advised her to go ahead and begin taking the Zofran.  If this is not effective, we can send in something different however we need to give this a try first.    Patient was also advised to follow up w/Dr. Dwyane Dee regarding thyroid labs and if any new symptoms develop over the weekend such as abdominal pain, fever or vomiting, she is to go to ED or Urgent Care to be evaluated immediately.  She will push fluids/electrolytes in the interim and is coming in to our office today 7/2 for labs.

## 2018-07-22 NOTE — Telephone Encounter (Signed)
I left message for patient to call office back.

## 2018-07-22 NOTE — Telephone Encounter (Signed)
Please call patient and see how she is feeling.  Did she take a pregnancy test?  Is she eating/drinking?  Also, after I reviewed her chart, I noticed that she didn't follow-up with the endocrinologist at the recommended time, approximately mid-June. We should probably update her thyroid labs to make sure everything looks okay. Dr. Dwyane Dee actually already has labs in for her to check.   If her nausea is improved, then she can just follow-up with Dr. Dwyane Dee.  If her nausea is not improved or worse, I can get labs. (If she is pregnant however, needs to see ob.)  Inda Coke PA-C

## 2018-07-22 NOTE — Telephone Encounter (Signed)
I have added labs for her to get checked if she would like to come in for that today.  Is the zofran working? If not, okay to send in 12.5 mg phenergan po q 6h #20 for her to trial -- may make her sleepy.  I do want to make sure that she follows up with Dr. Dwyane Dee to reassess her thyroid. (His future labs are already in, maybe they can be done while she is here for our labs?)  Any NEW symptoms (abdominal pain, vomiting, fever) needs urgent evaluation over the weekend.  Keep pushing fluids, try to get some electrlytes

## 2018-07-22 NOTE — Telephone Encounter (Signed)
Copied from [place] [id number]. Topic: General - Other >> Jul 22, 2018 10:30 AM Yvette Rack wrote: Reason for CRM: Pt requests that Anderson Malta call her back at [phone]

## 2018-07-22 NOTE — Telephone Encounter (Signed)
Spoke to patient and she states that she did take a UP test on 7/1 which was negative.  She reports that her LMP was 6/27 but was not a normal period.  She states that she did not have the normal lower back pain, breast tenderness or food cravings that she normally experiences.  She is very nauseous today and states that nausea is worse today than previous days.  She is drinking ginger ale but states that she has no desire to eat and the smell of many different foods make her more nauseous.  She is tolerating crackers, but doesn't have the desire to eat much else.  Please advise

## 2018-07-22 NOTE — Telephone Encounter (Signed)
Copied from [place] [id number]. Topic: Quick Communication - See Telephone Encounter >> Jul 22, 2018  9:44 AM Loma Boston wrote: CRM for notification. See Telephone encounter for: 07/22/18. Advised to pregnancy test/ is negative.  Gwynneth Munson had advised if negative for her to reach back out to her and she will order further testing. Pls FU with pt at [phone]. [phone] Leave a message if no answer please FU

## 2018-07-22 NOTE — Telephone Encounter (Signed)
Spoke to patient.  See later message from 10:30 am 7/2

## 2018-07-26 ENCOUNTER — Encounter: Payer: Self-pay | Admitting: Physician Assistant

## 2018-07-27 ENCOUNTER — Telehealth: Payer: Self-pay | Admitting: Physical Therapy

## 2018-07-27 ENCOUNTER — Telehealth: Payer: Self-pay | Admitting: Endocrinology

## 2018-07-27 NOTE — Telephone Encounter (Signed)
Patient says there is a lot of yellow clear mucus in her bowels, has back pain, and increasing nausea. Please advise.

## 2018-07-27 NOTE — Telephone Encounter (Signed)
Pt requesting lab results - drawn at [place] [place]

## 2018-07-27 NOTE — Telephone Encounter (Signed)
See note

## 2018-07-27 NOTE — Telephone Encounter (Signed)
Please advise 

## 2018-07-27 NOTE — Telephone Encounter (Signed)
She can have labs done at [facility] but needs to make an appointment for follow-up with me, virtual visit okay

## 2018-07-27 NOTE — Telephone Encounter (Signed)
Spoke with patient-she continues to have persistent nausea at different times of the day.  States that Zofran helps some but over the weekend she has had an increase in frequency of bowel movements (mostly normal stools) and admits to drinking a lot over the weekend which she normally does not.  Did have 1 very mucousy stool that was yellow and stringy and did have a different "fishy" odor to it.  Had 4 more bowel movements today that were essentially normal.    Also c/o lower back pain and excessive gagging.  Vomited x 1 over the weekend but "it was just foamy" Denies any acute distress currently, or any fever, cough, chills, body aches, chest pain or SOB.    I offered patient an appointment for 7/14 (she specifically requested this date since she was off of work that day) and advised her to call back if symptoms worsen or go to ED immediately if she develops fever, abdominal pain or vomiting.  She verbalized understanding.  Routed message to Inda Coke, PA-C for review.

## 2018-07-28 NOTE — Telephone Encounter (Signed)
Agree with plan.  The only other thing to offer is possible COVID-19 testing. As nausea and decreased appetite can be symptoms.

## 2018-07-28 NOTE — Telephone Encounter (Signed)
The patient is requesting the results of her recent blood work, not permission to have them done elsewhere. Does she need to be seen first?

## 2018-07-28 NOTE — Telephone Encounter (Signed)
She can make a virtual visit for discussion, I have not seen her since April

## 2018-07-28 NOTE — Telephone Encounter (Signed)
Could you please schedule this patient? 

## 2018-07-28 NOTE — Telephone Encounter (Signed)
Called patient and LMTCB and schedule virtual visit with Dr Dwyane Dee

## 2018-07-30 NOTE — Telephone Encounter (Signed)
Called patient and LMTCB and schedule her follow-up appointment per Dr Ronnie Derby request

## 2018-08-02 ENCOUNTER — Telehealth: Payer: Self-pay | Admitting: Physician Assistant

## 2018-08-02 NOTE — Telephone Encounter (Signed)
Preparing for tomorrow's office visit, I noticed patient was schedule for IN-OFFICE appointment for evaluation of "loose stools."   Diarrhea is a symptom of COVID-19 and unfortunately we are currently not seeing any patients in the office with these symptoms due to inadequate PPE.  We can either switch her visit to virtual, or send to urgent care.   If her symptoms are worsening, I feel urgent care would be best as she will need a physical exam.  Inda Coke PA-C

## 2018-08-03 ENCOUNTER — Other Ambulatory Visit: Payer: Self-pay | Admitting: Endocrinology

## 2018-08-03 ENCOUNTER — Ambulatory Visit: Payer: PRIVATE HEALTH INSURANCE | Admitting: Physician Assistant

## 2018-08-03 DIAGNOSIS — E0500 Thyrotoxicosis with diffuse goiter without thyrotoxic crisis or storm: Secondary | ICD-10-CM | POA: Insufficient documentation

## 2018-08-03 NOTE — Telephone Encounter (Signed)
Copied from [place] [id number]. Topic: Quick Communication - Appointment Cancellation >> Aug 03, 2018  7:32 AM Misty Murillo, Helene Kelp D wrote: Patient called to cancel appointment scheduled for 08/03/18 due to she is feeling better. Patient HAS NOTrescheduled their appointment.  Route to department's PEC pool.

## 2018-08-03 NOTE — Telephone Encounter (Signed)
I spoke with patient and she informed me that her symptoms were better, which she called in and canceled her appointment before I spoke with her. Sam is aware.

## 2018-08-06 ENCOUNTER — Other Ambulatory Visit: Payer: Self-pay | Admitting: Endocrinology

## 2018-08-06 ENCOUNTER — Telehealth: Payer: Self-pay | Admitting: Endocrinology

## 2018-08-06 NOTE — Telephone Encounter (Signed)
Please call pt and schedule visit. Per Dr. Dwyane Dee, pt must be seen before any refills can be issues. Pt was informed of this when she messaged via MyChart previously.

## 2018-08-06 NOTE — Telephone Encounter (Signed)
MEDICATION: Methimazole  PHARMACY:  [facility] on [address]  IS THIS A 90 DAY SUPPLY :   IS PATIENT OUT OF MEDICATION: yes, took last one today  IF NOT; HOW MUCH IS LEFT:   LAST APPOINTMENT DATE: @7 /17/2020  NEXT APPOINTMENT DATE:@7 /28/2020  DO WE HAVE YOUR PERMISSION TO LEAVE A DETAILED MESSAGE: YES  OTHER COMMENTS:    **Let patient know to contact pharmacy at the end of the day to make sure medication is ready. **  ** Please notify patient to allow 48-72 hours to process**  **Encourage patient to contact the pharmacy for refills or they can request refills through [facility]**

## 2018-08-06 NOTE — Telephone Encounter (Signed)
MEDICATION: Methimazole  PHARMACY:  [facility] on [address]  IS THIS A 90 DAY SUPPLY :   IS PATIENT OUT OF MEDICATION: yes, took last one today  IF NOT; HOW MUCH IS LEFT:   LAST APPOINTMENT DATE: @7 /17/2020  NEXT APPOINTMENT DATE:@7 /28/2020  DO WE HAVE YOUR PERMISSION TO LEAVE A DETAILED MESSAGE: YES  OTHER COMMENTS:    **Let patient know to contact pharmacy at the end of the day to make sure medication is ready. **  ** Please notify patient to allow 48-72 hours to process**  **Encourage patient to contact the pharmacy for refills or they can request refills through [facility]**

## 2018-08-09 ENCOUNTER — Other Ambulatory Visit: Payer: Self-pay

## 2018-08-09 MED ORDER — METHIMAZOLE 5 MG PO TABS: 5.0000 mg | ORAL_TABLET | Freq: Every day | ORAL | 0 refills | Status: DC

## 2018-08-09 NOTE — Telephone Encounter (Signed)
Patient called in to inform she went without her medication all weekend and would like to get a refill since she did schedule an appointment.  Please Advise, Thanks

## 2018-08-17 ENCOUNTER — Ambulatory Visit (INDEPENDENT_AMBULATORY_CARE_PROVIDER_SITE_OTHER): Payer: PRIVATE HEALTH INSURANCE | Admitting: Endocrinology

## 2018-08-17 ENCOUNTER — Other Ambulatory Visit: Payer: Self-pay

## 2018-08-17 ENCOUNTER — Encounter: Payer: Self-pay | Admitting: Endocrinology

## 2018-08-17 DIAGNOSIS — E0590 Thyrotoxicosis, unspecified without thyrotoxic crisis or storm: Secondary | ICD-10-CM | POA: Diagnosis not present

## 2018-08-17 MED ORDER — METHIMAZOLE 5 MG PO TABS: 5.0000 mg | ORAL_TABLET | Freq: Every day | ORAL | 1 refills | Status: DC

## 2018-08-17 NOTE — Progress Notes (Signed)
Patient ID: Misty Murillo, female   DOB: 1990/01/26, 28 y.o.   MRN: [id number]                                                                                                                     Reason for Appointment: Probable hyperthyroidism, follow-up visit    Today's office visit was provided via telemedicine using video technique Explained to the patient and the the limitations of evaluation and management by telemedicine and the availability of in person appointments.  The patient understood the limitations and agreed to proceed. Patient also understood that the telehealth visit is billable. . Location of the patient: Home . Location of the provider: Office Only the patient and myself were participating in the encounter   Referring healthcare provider: Orma Flaming   Chief complaint: Follow-up of thyroid   History of Present Illness:    The patient was being evaluated at her annual physical exam in 2/20 symptoms of fatigue, anxiety and depression  She was seen in consultation in 03/2018 On her exam she had no abnormal findings and no goiter  However baseline free T3 was high at 4.6 with undetectable TSH  Since her thyrotropin receptor antibody was high normal she was started on methimazole 5 mg daily  On her follow-up in 04/2018 she had less fatigue although her weight had gone up She however has not come to follow-up until today  Her weight has been stable She does not complain of fatigue, anxiety or shakiness, no palpitations  Thyroid levels as above about 4 weeks ago show unchanged free T4, normal free T3 and now TSH is also normal compared to previous levels    Wt Readings from Last 3 Encounters:  05/21/18 235 lb (106.6 kg)  05/07/18 235 lb (106.6 kg)  04/07/18 235 lb (106.6 kg)     Thyroid function tests as follows:     Lab Results  Component Value Date   FREET4 0.72 07/22/2018   FREET4 0.77 05/17/2018   FREET4 1.05 04/07/2018   T3FREE 3.7 07/22/2018    T3FREE 4.6 (H) 04/07/2018   TSH 1.14 07/22/2018   TSH 0.02 (L) 05/17/2018   TSH <0.01 (L) 04/07/2018    Lab Results  Component Value Date   THYROTRECAB 1.73 04/07/2018     Allergies as of 08/17/2018   No Known Allergies     Medication List       Accurate as of August 17, 2018  4:11 PM. If you have any questions, ask your nurse or doctor.        acetaminophen 325 MG tablet Commonly known as: TYLENOL Take 650 mg by mouth every 6 (six) hours as needed.   benzonatate 200 MG capsule Commonly known as: TESSALON Take 1 capsule (200 mg total) by mouth 3 (three) times daily as needed.   EPINEPHrine 0.3 mg/0.3 mL Soaj injection Commonly known as: EPI-PEN Inject 0.3 mg into the muscle as needed (allergic reaction).   fluticasone 50 MCG/ACT nasal spray Commonly known  as: FLONASE Place 2 sprays into both nostrils daily.   loperamide 2 MG capsule Commonly known as: IMODIUM Take 1 capsule (2 mg total) by mouth 4 (four) times daily as needed for diarrhea or loose stools.   methimazole 5 MG tablet Commonly known as: TAPAZOLE Take 1 tablet (5 mg total) by mouth daily. TAKE 1 TABLET BY MOUTH ONCE DAILY.   ondansetron 4 MG disintegrating tablet Commonly known as: Zofran ODT Take 1 tablet (4 mg total) by mouth every 8 (eight) hours as needed for nausea or vomiting.   Vitamin D 12.5 MCG/0.25ML Liqd Take 3 mLs by mouth daily. For 12 weeks           Past Medical History:  Diagnosis Date  . Anxiety   . Depression   . Headache   . Liver mass   . Migraines     Past Surgical History:  Procedure Laterality Date  . CHOLECYSTECTOMY    . RESECTION LIVER      Family History  Problem Relation Age of Onset  . Diabetes Mother   . Hyperthyroidism Mother   . Migraines Father   . Cancer Other   . Hyperthyroidism Brother     Social History:  reports that she has been smoking cigarettes. She has never used smokeless tobacco. She reports current drug use. Drug: Marijuana. She  reports that she does not drink alcohol.  Allergies: No Known Allergies   Review of Systems  She is on vitamin D supplements   Examination:   There were no vitals taken for this visit.           Assessment/Plan:   Hyperthyroidism, mild and likely to be from mild Graves' disease especially with her family history being positive  Her baseline symptoms of fatigue and anxiety are controlled on 5 mg methimazole that she has taken since about 03/2018 No previous history of goiter  Thyroid levels have been maintained in the normal range and TSH has normalized now Although her labs were done about 4 weeks ago she is subjectively still doing well  She will continue her methimazole 5 mg daily but call if she has any unusual fatigue  Discussed need to regularly follow-up and monitor her labs which will be done at the end of August now Explained to her that once her thyroid levels are starting to meet lower we may be able to stop her methimazole  Elayne Snare 08/17/2018, 4:11 PM    Note: This office note was prepared with Dragon voice recognition system technology. Any transcriptional errors that result from this process are unintentional.

## 2018-08-20 ENCOUNTER — Ambulatory Visit
Admission: RE | Admit: 2018-08-20 | Discharge: 2018-08-20 | Disposition: A | Discharge status: Home / Self Care | Payer: PRIVATE HEALTH INSURANCE | Source: Ambulatory Visit | Attending: Family Medicine | Admitting: Family Medicine

## 2018-08-20 ENCOUNTER — Other Ambulatory Visit: Payer: Self-pay

## 2018-08-20 DIAGNOSIS — R160 Hepatomegaly, not elsewhere classified: Secondary | ICD-10-CM

## 2018-08-20 MED ORDER — GADOXETATE DISODIUM 0.25 MMOL/ML IV SOLN
10.0000 mL | Freq: Once | INTRAVENOUS | Status: AC | PRN
  Administered 2018-08-20: 10 mL via INTRAVENOUS

## 2018-08-26 ENCOUNTER — Encounter: Payer: Self-pay | Admitting: Family Medicine

## 2018-08-31 ENCOUNTER — Other Ambulatory Visit: Payer: Self-pay | Admitting: Family Medicine

## 2018-08-31 DIAGNOSIS — R160 Hepatomegaly, not elsewhere classified: Secondary | ICD-10-CM

## 2018-09-14 ENCOUNTER — Encounter: Payer: Self-pay | Admitting: Family Medicine

## 2018-09-14 ENCOUNTER — Telehealth: Payer: Self-pay

## 2018-09-14 NOTE — Telephone Encounter (Signed)
Spoke to patient.  She has been c/o severe nausea, fatigue, epigastric/chest pain x 1 week.  She reports that her belly is very swollen and states "it looks like I'm pregnant" and her belly is hard to the touch.  Denies, F/C/C, V/D, cough.  States that she wears a mask every day to work and has her temperature checked every morning also.  She does admit to feeling SOB and has pain in her chest area right around her ribs.  The pain does radiate around to her upper back under her shoulder blades; she also states that the pain sometimes will radiate down to her legs.  Based on her symptoms, advised patient to go to ED for evaluation.  She verbalized understanding and will go to [facility] ED.

## 2018-09-15 ENCOUNTER — Emergency Department (HOSPITAL_COMMUNITY)
Admission: EM | Admit: 2018-09-15 | Discharge: 2018-09-15 | Disposition: A | Discharge status: Home / Self Care | Payer: PRIVATE HEALTH INSURANCE | Attending: Emergency Medicine | Admitting: Emergency Medicine

## 2018-09-15 ENCOUNTER — Other Ambulatory Visit: Payer: Self-pay

## 2018-09-15 ENCOUNTER — Ambulatory Visit: Payer: Self-pay

## 2018-09-15 ENCOUNTER — Emergency Department (HOSPITAL_COMMUNITY): Payer: PRIVATE HEALTH INSURANCE

## 2018-09-15 ENCOUNTER — Encounter (HOSPITAL_COMMUNITY): Payer: Self-pay

## 2018-09-15 DIAGNOSIS — K529 Noninfective gastroenteritis and colitis, unspecified: Secondary | ICD-10-CM | POA: Diagnosis not present

## 2018-09-15 DIAGNOSIS — F17290 Nicotine dependence, other tobacco product, uncomplicated: Secondary | ICD-10-CM | POA: Insufficient documentation

## 2018-09-15 DIAGNOSIS — Z79899 Other long term (current) drug therapy: Secondary | ICD-10-CM | POA: Diagnosis not present

## 2018-09-15 DIAGNOSIS — R1032 Left lower quadrant pain: Secondary | ICD-10-CM | POA: Diagnosis present

## 2018-09-15 LAB — CBC
HCT: 37.6 % (ref 36.0–46.0)
Hemoglobin: 12.5 g/dL (ref 12.0–15.0)
MCH: 27.5 pg (ref 26.0–34.0)
MCHC: 33.2 g/dL (ref 30.0–36.0)
MCV: 82.8 fL (ref 80.0–100.0)
Platelets: 317 10*3/uL (ref 150–400)
RBC: 4.54 MIL/uL (ref 3.87–5.11)
RDW: 13.9 % (ref 11.5–15.5)
WBC: 7.2 10*3/uL (ref 4.0–10.5)
nRBC: 0 % (ref 0.0–0.2)

## 2018-09-15 LAB — COMPREHENSIVE METABOLIC PANEL
ALT: 15 U/L (ref 0–44)
AST: 18 U/L (ref 15–41)
Albumin: 4 g/dL (ref 3.5–5.0)
Alkaline Phosphatase: 89 U/L (ref 38–126)
Anion gap: 7 (ref 5–15)
BUN: 9 mg/dL (ref 6–20)
CO2: 27 mmol/L (ref 22–32)
Calcium: 9.2 mg/dL (ref 8.9–10.3)
Chloride: 105 mmol/L (ref 98–111)
Creatinine, Ser: 0.71 mg/dL (ref 0.44–1.00)
GFR calc Af Amer: >60 mL/min (ref >60)
GFR calc non Af Amer: >60 mL/min (ref >60)
Glucose, Bld: 88 mg/dL (ref 70–99)
Potassium: 3.6 mmol/L (ref 3.5–5.1)
Sodium: 139 mmol/L (ref 135–145)
Total Bilirubin: 0.5 mg/dL (ref 0.3–1.2)
Total Protein: 7.9 g/dL (ref 6.5–8.1)

## 2018-09-15 LAB — URINALYSIS, ROUTINE W REFLEX MICROSCOPIC
Bilirubin Urine: NEGATIVE
Glucose, UA: NEGATIVE mg/dL
Hgb urine dipstick: NEGATIVE
Ketones, ur: NEGATIVE mg/dL
Leukocytes,Ua: NEGATIVE
Nitrite: NEGATIVE
Protein, ur: NEGATIVE mg/dL
Specific Gravity, Urine: 1.01 (ref 1.005–1.030)
pH: 6 (ref 5.0–8.0)

## 2018-09-15 LAB — LIPASE, BLOOD: Lipase: 18 U/L (ref 11–51)

## 2018-09-15 LAB — PREGNANCY, URINE: Preg Test, Ur: NEGATIVE

## 2018-09-15 MED ORDER — SODIUM CHLORIDE 0.9% FLUSH
3.0000 mL | Freq: Once | INTRAVENOUS | Status: AC
  Administered 2018-09-15: 3 mL via INTRAVENOUS

## 2018-09-15 MED ORDER — ONDANSETRON 4 MG PO TBDP: 4.0000 mg | ORAL_TABLET | Freq: Three times a day (TID) | ORAL | 0 refills | Status: DC | PRN

## 2018-09-15 MED ORDER — HYDROCODONE-ACETAMINOPHEN 5-325 MG PO TABS: 1.0000 | ORAL_TABLET | ORAL | 0 refills | Status: DC | PRN

## 2018-09-15 MED ORDER — IOHEXOL 300 MG/ML  SOLN
100.0000 mL | Freq: Once | INTRAMUSCULAR | Status: AC | PRN
  Administered 2018-09-15: 100 mL via INTRAVENOUS

## 2018-09-15 NOTE — Discharge Instructions (Signed)
See your Physician for recheck.  °

## 2018-09-15 NOTE — Telephone Encounter (Signed)
Pt. Reports she is still having abdominal pain. Left upper side near "my rib cage, goes around into my low back." Has bloating, "looks like I have a water melon in my stomach." Pain comes and goes. Had a bowel movement this morning. Has periods of nausea. No fever. Pain is 7/10. Was told yesterday by the practice to go to ED - she did not. Instructed with her symptoms , she  Should  go to ED. Verbalizes she will.  Reason for Disposition . Patient sounds very sick or weak to the triager  Answer Assessment - Initial Assessment Questions 1. LOCATION: "Where does it hurt?"      Left upper, near ribs 2. RADIATION: "Does the pain shoot anywhere else?" (e.g., chest, back)     Into low back 3. ONSET: "When did the pain begin?" (e.g., minutes, hours or days ago)      Started the weekend 4. SUDDEN: "Gradual or sudden onset?"     Gradual 5. PATTERN "Does the pain come and go, or is it constant?"    - If constant: "Is it getting better, staying the same, or worsening?"      (Note: Constant means the pain never goes away completely; most serious pain is constant and it progresses)     - If intermittent: "How long does it last?" "Do you have pain now?"     (Note: Intermittent means the pain goes away completely between bouts)     Comes and goes 6. SEVERITY: "How bad is the pain?"  (e.g., Scale 1-10; mild, moderate, or severe)   - MILD (1-3): doesn't interfere with normal activities, abdomen soft and not tender to touch    - MODERATE (4-7): interferes with normal activities or awakens from sleep, tender to touch    - SEVERE (8-10): excruciating pain, doubled over, unable to do any normal activities      Changes, but now it is a 7 7. RECURRENT SYMPTOM: "Have you ever had this type of abdominal pain before?" If so, ask: "When was the last time?" and "What happened that time?"      No 8. CAUSE: "What do you think is causing the abdominal pain?"     Unsure 9. RELIEVING/AGGRAVATING FACTORS: "What makes it  better or worse?" (e.g., movement, antacids, bowel movement)     Had a bowel movement felt better 10. OTHER SYMPTOMS: "Has there been any vomiting, diarrhea, constipation, or urine problems?"       Nausea, bloating 11. PREGNANCY: "Is there any chance you are pregnant?" "When was your last menstrual period?"       No  Protocols used: ABDOMINAL PAIN - [facility]

## 2018-09-15 NOTE — Telephone Encounter (Signed)
Noted. Agree as recommended yesterday... Orma Flaming, MD [place]

## 2018-09-15 NOTE — ED Triage Notes (Signed)
Pt reports abdominal pain since Saturday and swelling noted on Monday. States she had a transvaginal ultrasound on 08/24/18 and was not pregnant.Pt reports has liver lesion which has grown. Pressure causing pain and nausea/

## 2018-09-15 NOTE — ED Provider Notes (Signed)
[facility] EMERGENCY DEPARTMENT Provider Note   CSN: [id number] Arrival date & time: 09/15/18  1155     History   Chief Complaint Chief Complaint  Patient presents with  . Abdominal Pain    HPI Misty Murillo is a 28 y.o. female.     The history is provided by the patient. No language interpreter was used.  Abdominal Pain Pain location:  LLQ Pain quality: aching and bloating   Pain radiates to:  Does not radiate Pain severity:  Moderate Onset quality:  Gradual Duration:  2 days Timing:  Constant Progression:  Worsening Chronicity:  New Context: not alcohol use and not sick contacts   Relieved by:  Nothing Worsened by:  Nothing Ineffective treatments:  None tried Associated symptoms: nausea   Risk factors: multiple surgeries   Pt has had gallbladder removed and a hepatic hemangioma removed.  Pt has a known current liver mass.  Past Medical History:  Diagnosis Date  . Anxiety   . Depression   . Headache   . Liver mass   . Migraines     Patient Active Problem List   Diagnosis Date Noted  . Hyperthyroidism 03/08/2018  . Vitamin D deficiency 03/08/2018  . Sickle cell trait ([place]) 05/26/2017  . Liver mass, left lobe 05/18/2017    Past Surgical History:  Procedure Laterality Date  . CHOLECYSTECTOMY    . RESECTION LIVER       OB History    Gravida  2   Para  1   Term  1   Preterm  0   AB  0   Living  1     SAB  0   TAB  0   Ectopic  0   Multiple  0   Live Births  1            Home Medications    Prior to Admission medications   Medication Sig Start Date End Date Taking? Authorizing Provider  acetaminophen (TYLENOL) 325 MG tablet Take 650 mg by mouth every 6 (six) hours as needed.   Yes [provider]  EPINEPHrine 0.3 mg/0.3 mL IJ SOAJ injection Inject 0.3 mg into the muscle as needed (allergic reaction).  02/16/17  Yes [provider]  fluticasone (FLONASE) 50 MCG/ACT nasal spray Place 2 sprays into both  nostrils daily. 05/21/18  Yes Orma Flaming, MD  methimazole (TAPAZOLE) 5 MG tablet Take 1 tablet (5 mg total) by mouth daily. TAKE 1 TABLET BY MOUTH ONCE DAILY. 08/17/18  Yes Elayne Snare, MD  Vitamin D 12.5 MCG/0.25ML LIQD Take 3 mLs by mouth daily. For 12 weeks 03/15/18  Yes Orma Flaming, MD  benzonatate (TESSALON) 200 MG capsule Take 1 capsule (200 mg total) by mouth 3 (three) times daily as needed. Patient not taking: Reported on 09/15/2018 05/21/18   Orma Flaming, MD  HYDROcodone-acetaminophen (NORCO/VICODIN) 5-325 MG tablet Take 1 tablet by mouth every 4 (four) hours as needed. 09/15/18   Fransico Meadow, PA-C  ondansetron (ZOFRAN ODT) 4 MG disintegrating tablet Take 1 tablet (4 mg total) by mouth every 8 (eight) hours as needed for nausea or vomiting. 09/15/18   Fransico Meadow, PA-C    Family History Family History  Problem Relation Age of Onset  . Diabetes Mother   . Hyperthyroidism Mother   . Migraines Father   . Cancer Other   . Hyperthyroidism Brother     Social History Social History   Tobacco Use  . Smoking status: Current  Every Day Smoker    Packs/day: 0.00    Types: Cigars    Last attempt to quit: 01/27/2018    Years since quitting: 0.6  . Smokeless tobacco: Never Used  Substance Use Topics  . Alcohol use: No    Comment: rarely   . Drug use: Yes    Types: Marijuana     Allergies   Patient has no known allergies.   Review of Systems Review of Systems  Gastrointestinal: Positive for abdominal pain and nausea.  All other systems reviewed and are negative.    Physical Exam Updated Vital Signs BP 116/79 (BP Location: Right Arm)   Pulse 61   Temp 98.5 F (36.9 C) (Oral)   Resp 15   Ht 5\' 3"  (1.6 m)   Wt 107.8 kg   LMP 09/06/2018   SpO2 100%   BMI 42.08 kg/m   Physical Exam Vitals signs and nursing note reviewed.  Constitutional:      Appearance: She is well-developed.  HENT:     Head: Normocephalic.  Neck:     Musculoskeletal: Normal range of  motion.  Cardiovascular:     Rate and Rhythm: Normal rate.     Heart sounds: Normal heart sounds.  Pulmonary:     Effort: Pulmonary effort is normal.  Abdominal:     General: Bowel sounds are normal. There is no distension.     Palpations: Abdomen is soft.     Tenderness: There is abdominal tenderness in the left lower quadrant.  Musculoskeletal: Normal range of motion.  Skin:    General: Skin is warm.  Neurological:     General: No focal deficit present.     Mental Status: She is alert and oriented to person, place, and time.  Psychiatric:        Mood and Affect: Mood normal.      ED Treatments / Results  Labs (all labs ordered are listed, but only abnormal results are displayed) Labs Reviewed  LIPASE, BLOOD  COMPREHENSIVE METABOLIC PANEL  CBC  URINALYSIS, ROUTINE W REFLEX MICROSCOPIC  PREGNANCY, URINE    EKG None  Radiology Ct Abdomen Pelvis W Contrast  Result Date: 09/15/2018 CLINICAL DATA:  Abdominal mass, nonpulsatile. Upper abdominal pain and nausea since 09/11/2018 prior liver resection EXAM: CT ABDOMEN AND PELVIS WITH CONTRAST TECHNIQUE: Multidetector CT imaging of the abdomen and pelvis was performed using the standard protocol following bolus administration of intravenous contrast. CONTRAST:  164mL OMNIPAQUE IOHEXOL 300 MG/ML  SOLN COMPARISON:  MR abdomen 08/20/2018, CT abdomen pelvis 05/07/2018 FINDINGS: Lower chest: Bandlike areas of basilar atelectasis and/or scarring. Normal heart size. No pericardial effusion. Hepatobiliary: Redemonstration of the lobular 9.1 x 11.3 x 11.9 cm mass centered in the medial segment of the left hepatic lobe, abutting the left falciform ligament. There is mild left intrahepatic ductal dilatation which is similar to prior studies likely resulting from compression of the left hepatic duct. No new or concerning hepatic lesions. Gallbladder is surgically absent. Pancreas: Unremarkable. No pancreatic ductal dilatation or surrounding  inflammatory changes. Spleen: This Normal in size without focal abnormality. Accessory splenule noted posteriorly. Adrenals/Urinary Tract: Adrenal glands are unremarkable. Kidneys are normal, without renal calculi, focal lesion, or hydronephrosis. Bladder is unremarkable. Stomach/Bowel: Distal esophagus, stomach and duodenal sweep are unremarkable. No small bowel wall thickening or dilatation. No evidence of obstruction. A normal appendix is visualized. Mild pancolonic mural thickening with pain pericolonic haze. Vascular/Lymphatic: No significant vascular findings are present. No enlarged abdominal or pelvic lymph nodes. Reproductive:  Subserosal left uterine fundal fibroid. Unchanged from prior. No concerning adnexal lesions. Other: No abdominopelvic free fluid or free gas. No bowel containing hernias. Musculoskeletal: No acute or significant osseous findings. IMPRESSION: 1. Mild pancolonic mural thickening with pericolonic haze, consistent with colitis, which could be infectious or inflammatory in etiology. 2. No significant change in size or appearance of the lobular 9.1 x 11.3 x 11.9 cm mass, previously characterized as a focal nodular hyperplasia. 3. Mild left intrahepatic ductal dilatation, likely resulting from compression of the left hepatic duct. 4. Fibroid uterus. Electronically Signed   By: Lovena Le M.D.   On: 09/15/2018 16:20    Procedures Procedures (including critical care time)  Medications Ordered in ED Medications  sodium chloride flush (NS) 0.9 % injection 3 mL (3 mLs Intravenous Given 09/15/18 1318)  iohexol (OMNIPAQUE) 300 MG/ML solution 100 mL (100 mLs Intravenous Contrast Given 09/15/18 1557)     Initial Impression / Assessment and Plan / ED Course  I have reviewed the triage vital signs and the nursing notes.  Pertinent labs & imaging results that were available during my care of the patient were reviewed by me and considered in my medical decision making (see chart for  details).        MDM  Pt counseled on colitis.  I will treat with cipro and flagyl.   Pt advised to follow up with her Gi doctor for recheck   Final Clinical Impressions(s) / ED Diagnoses   Final diagnoses:  Colitis    ED Discharge Orders         Ordered    ondansetron (ZOFRAN ODT) 4 MG disintegrating tablet  Every 8 hours PRN     09/15/18 1703    HYDROcodone-acetaminophen (NORCO/VICODIN) 5-325 MG tablet  Every 4 hours PRN     09/15/18 1729        An After Visit Summary was printed and given to the patient.    Sidney Ace 09/15/18 2143    Nat Christen, MD 09/16/18 1045

## 2018-09-15 NOTE — Telephone Encounter (Signed)
See note

## 2018-09-21 ENCOUNTER — Other Ambulatory Visit: Payer: Self-pay

## 2018-09-21 MED ORDER — METHIMAZOLE 5 MG PO TABS: 5.0000 mg | ORAL_TABLET | Freq: Every day | ORAL | 0 refills | Status: DC

## 2018-09-22 ENCOUNTER — Other Ambulatory Visit: Payer: Self-pay

## 2018-09-23 ENCOUNTER — Telehealth: Payer: Self-pay | Admitting: Physical Therapy

## 2018-09-23 ENCOUNTER — Ambulatory Visit: Payer: PRIVATE HEALTH INSURANCE | Admitting: Gastroenterology

## 2018-09-23 ENCOUNTER — Encounter: Payer: Self-pay | Admitting: Gastroenterology

## 2018-09-23 VITALS — BP 122/72 | HR 92 | Temp 97.6°F | Ht 63.0 in | Wt 239.4 lb

## 2018-09-23 DIAGNOSIS — R160 Hepatomegaly, not elsewhere classified: Secondary | ICD-10-CM

## 2018-09-23 DIAGNOSIS — R194 Change in bowel habit: Secondary | ICD-10-CM | POA: Diagnosis not present

## 2018-09-23 DIAGNOSIS — R140 Abdominal distension (gaseous): Secondary | ICD-10-CM

## 2018-09-23 DIAGNOSIS — R1010 Upper abdominal pain, unspecified: Secondary | ICD-10-CM

## 2018-09-23 DIAGNOSIS — K7689 Other specified diseases of liver: Secondary | ICD-10-CM

## 2018-09-23 NOTE — Telephone Encounter (Signed)
Copied from [place] [id number]. Topic: Referral - Request for Referral >> Sep 23, 2018  9:09 AM Rainey Pines A wrote: Has patient seen PCP for this complaint? Yes *If NO, is insurance requiring patient see PCP for this issue before PCP can refer them? Referral for which specialty: Liver surgeon Preferred provider/office: [facility] facility. Patient does not wish to return to [place] specialist she was seeing  Reason for referral: Large  tumor on liver that is growing.

## 2018-09-23 NOTE — Patient Instructions (Signed)
If you are age 27 or older, your body mass index should be between 23-30. Your Body mass index is 42.41 kg/m. If this is out of the aforementioned range listed, please consider follow up with your Primary Care Provider.  If you are age 42 or younger, your body mass index should be between 19-25. Your Body mass index is 42.41 kg/m.If this is out of the aformentioned range listed, please consider follow up with your Primary Care Provider.   [place]ase start a 1/2 capful of Miralax, titrate to 1 capful as needed.  Please follow up with hepatic surgeon.  It was a pleasure to see you today!  Dr. Loletha Carrow

## 2018-09-23 NOTE — Progress Notes (Signed)
[facility] Progress Note  Chief Complaint: Abdominal pain  Subjective  History: Telemedicine consult 04/12/2018 history of F and H liver mass resected in the [facility] system.  The details of that are outlined in the telemedicine note.  It was not clear why the patient was referred to [place] for follow-up of that, so I referred her back to her surgeon with recommendation to get follow-up MRI.  She was in the [facility] emergency department on 09/15/2018 for abdominal pain and bloating. CT suggested "colitis", and prescribed flagyl.  Misty Murillo had 5 to 6 days of upper abdominal bloated discomfort with nausea and distention prior to that ED visit.  Her typical bowel pattern was 3-4 BMs per day, with the occasional use of herbal tea and "purges".  Leading up to that ED visit, she has realized in retrospect there have been decreased bowel movements of perhaps every other day.  She denies rectal bleeding.  Symptoms are much improved from what they were last week, she still has some residual bloating and nausea.  She was concerned about the colitis diagnosis and what this might mean.  She is still taking ondansetron as needed.  Also, she has not yet seen her hepatobiliary surgeon, and needs to call her insurance to find out if they are still in network for her.  I had a recommended that she have at least a telemedicine follow-up with them so they can review the findings of her recent MRI.  ROS: Cardiovascular:  no chest pain Respiratory: no dyspnea Remainder of systems negative except as above The patient's Past Medical, Family and Social History were reviewed and are on file in the EMR. She works in Psychologist, counselling for [employer]  Objective:  Med list reviewed  Current Outpatient Medications:  .  acetaminophen (TYLENOL) 325 MG tablet, Take 650 mg by mouth every 6 (six) hours as needed., Disp: , Rfl:  .  benzonatate (TESSALON) 200 MG capsule, Take 1 capsule (200 mg total) by mouth 3 (three)  times daily as needed., Disp: 30 capsule, Rfl: 0 .  EPINEPHrine 0.3 mg/0.3 mL IJ SOAJ injection, Inject 0.3 mg into the muscle as needed (allergic reaction). , Disp: , Rfl: 1 .  fluticasone (FLONASE) 50 MCG/ACT nasal spray, Place 2 sprays into both nostrils daily., Disp: 16 g, Rfl: 6 .  HYDROcodone-acetaminophen (NORCO/VICODIN) 5-325 MG tablet, Take 1 tablet by mouth every 4 (four) hours as needed., Disp: 10 tablet, Rfl: 0 .  methimazole (TAPAZOLE) 5 MG tablet, Take 1 tablet (5 mg total) by mouth daily. TAKE 1 TABLET BY MOUTH ONCE DAILY., Disp: 15 tablet, Rfl: 0 .  ondansetron (ZOFRAN ODT) 4 MG disintegrating tablet, Take 1 tablet (4 mg total) by mouth every 8 (eight) hours as needed for nausea or vomiting., Disp: 20 tablet, Rfl: 0 .  Vitamin D 12.5 MCG/0.25ML LIQD, Take 3 mLs by mouth daily. For 12 weeks, Disp: 84 mL, Rfl: 3   Vital signs in last 24 hrs: Vitals:   09/23/18 0822  BP: 122/72  Pulse: 92  Temp: 97.6 F (36.4 C)  SpO2: 97%    Physical Exam  Well-appearing, pleasant and conversational  HEENT: sclera anicteric, oral mucosa moist without lesions  Neck: supple, no thyromegaly, JVD or lymphadenopathy  Cardiac: RRR without murmurs, S1S2 heard, no peripheral edema  Pulm: clear to auscultation bilaterally, normal RR and effort noted  Abdomen: soft, long midline scar without tenderness or hernia, with active bowel sounds.  Liver is enlarged 3 fingerbreadths below  costal margin  Skin; warm and dry, no jaundice or rash  Recent Labs:  CBC Latest Ref Rng & Units 09/15/2018 07/22/2018 05/07/2018  WBC 4.0 - 10.5 K/uL 7.2 7.5 7.2  Hemoglobin 12.0 - 15.0 g/dL 12.5 12.1 12.7  Hematocrit 36.0 - 46.0 % 37.6 35.6(L) 37.6  Platelets 150 - 400 K/uL 317 317.0 280   CMP Latest Ref Rng & Units 09/15/2018 07/22/2018 05/07/2018  Glucose 70 - 99 mg/dL 88 87 89  BUN 6 - 20 mg/dL 9 7 7   Creatinine 0.44 - 1.00 mg/dL 0.71 0.81 0.61  Sodium 135 - 145 mmol/L 139 139 136  Potassium 3.5 - 5.1 mmol/L  3.6 3.8 3.5  Chloride 98 - 111 mmol/L 105 103 103  CO2 22 - 32 mmol/L 27 28 25   Calcium 8.9 - 10.3 mg/dL 9.2 8.7 8.7(L)  Total Protein 6.5 - 8.1 g/dL 7.9 7.2 7.9  Total Bilirubin 0.3 - 1.2 mg/dL 0.5 0.4 0.3  Alkaline Phos 38 - 126 U/L 89 91 97  AST 15 - 41 U/L 18 14 18   ALT 0 - 44 U/L 15 12 16     Radiologic studies:  CLINICAL DATA:  Abdominal mass, nonpulsatile. Upper abdominal pain and nausea since 09/11/2018 prior liver resection   EXAM: CT ABDOMEN AND PELVIS WITH CONTRAST   TECHNIQUE: Multidetector CT imaging of the abdomen and pelvis was performed using the standard protocol following bolus administration of intravenous contrast.   CONTRAST:  165mL OMNIPAQUE IOHEXOL 300 MG/ML  SOLN   COMPARISON:  MR abdomen 08/20/2018, CT abdomen pelvis 05/07/2018   FINDINGS: Lower chest: Bandlike areas of basilar atelectasis and/or scarring. Normal heart size. No pericardial effusion.   Hepatobiliary: Redemonstration of the lobular 9.1 x 11.3 x 11.9 cm mass centered in the medial segment of the left hepatic lobe, abutting the left falciform ligament. There is mild left intrahepatic ductal dilatation which is similar to prior studies likely resulting from compression of the left hepatic duct. No new or concerning hepatic lesions. Gallbladder is surgically absent.   Pancreas: Unremarkable. No pancreatic ductal dilatation or surrounding inflammatory changes.   Spleen: This Normal in size without focal abnormality. Accessory splenule noted posteriorly.   Adrenals/Urinary Tract: Adrenal glands are unremarkable. Kidneys are normal, without renal calculi, focal lesion, or hydronephrosis. Bladder is unremarkable.   Stomach/Bowel: Distal esophagus, stomach and duodenal sweep are unremarkable. No small bowel wall thickening or dilatation. No evidence of obstruction. A normal appendix is visualized. Mild pancolonic mural thickening with pain pericolonic haze.   Vascular/Lymphatic: No  significant vascular findings are present. No enlarged abdominal or pelvic lymph nodes.   Reproductive: Subserosal left uterine fundal fibroid. Unchanged from prior. No concerning adnexal lesions.   Other: No abdominopelvic free fluid or free gas. No bowel containing hernias.   Musculoskeletal: No acute or significant osseous findings.   IMPRESSION: 1. Mild pancolonic mural thickening with pericolonic haze, consistent with colitis, which could be infectious or inflammatory in etiology. 2. No significant change in size or appearance of the lobular 9.1 x 11.3 x 11.9 cm mass, previously characterized as a focal nodular hyperplasia. 3. Mild left intrahepatic ductal dilatation, likely resulting from compression of the left hepatic duct. 4. Fibroid uterus.     Electronically Signed   By: Lovena Le M.D.   On: 09/15/2018 16:20  (I personally reviewed the CT images of 09/15/2018.  There is no oral contrast.  I do not appreciate any colonic wall thickening or pericolonic stranding.)  _____________________________________________________   CLINICAL DATA:  Followup liver mass. History of resection of a smaller lesion from the right hepatic lobe inferiorly.   EXAM: MRI ABDOMEN WITHOUT AND WITH CONTRAST   TECHNIQUE: Multiplanar multisequence MR imaging of the abdomen was performed both before and after the administration of intravenous contrast.   CONTRAST:  34mL EOVIST GADOXETATE DISODIUM 0.25 MOL/L IV SOLN   COMPARISON:  MRI abdomen 04/27/2017 and CT abdomen 05/07/2018   FINDINGS: Lower chest: The lung bases are grossly clear. No worrisome pulmonary lesions. No pleural or pericardial effusion.   Hepatobiliary: There is a 12 x 10 x 8.5 cm exophytic lesion associated with segment 4B of the liver this has relatively low T2 signal intensity, similar to that of the liver and is slightly lower signal intensity in the liver on the T1 weighted images. The lesion is  well-circumscribed and appears in capsulated. No obvious central scar. The lesion again demonstrates avid contrast enhancement and retains the Eovist on delayed phase sequences similar to slightly greater than the hepatic parenchyma. Findings are typical for focal nodular hyperplasia.   Patient had a second lesion in segment 6 of the liver on the prior MRI but this has been resected. No residual or recurrent lesion is identified. No other hepatic lesions are identified.   Pancreas: No mass, inflammation or ductal dilatation. There is mild mass effect on the head of the pancreas by the liver lesion.   Spleen:  Normal size.  No focal lesions.   Adrenals/Urinary Tract: The adrenal glands and kidneys are unremarkable. Mild mass effect on the anterior cortex of the right kidney by the hepatic lesion   Stomach/Bowel: Visualized portions within the abdomen are unremarkable.   Vascular/Lymphatic: No pathologically enlarged lymph nodes identified. No abdominal aortic aneurysm demonstrated.   Other:  No ascites or abdominal wall hernia.   Musculoskeletal: No significant bony findings.   IMPRESSION: 1. Slight interval enlargement of the large exophytic left hepatic lobe lesion now measuring 12 x 10 x 8.5 cm and previously measuring 10.5 x 7.5 x 7.0 cm. The lesion has typical MR imaging features of focal nodular hyperplasia. 2. Interval resection of the small segment 6 liver lesion. 3. No abdominal lymphadenopathy or acute abdominal findings.     Electronically Signed   By: Marijo Sanes M.D.   On: 08/20/2018 18:40    @ASSESSMENTPLANBEGIN @ Assessment: Encounter Diagnoses  Name Primary?  Marland Kitchen Upper abdominal pain Yes  . Abdominal bloating   . Change in bowel habits   . Focal nodular hyperplasia of liver    Her recent episode of upper abdominal pain bloating and nausea appears to be from constipation that has now largely resolved. I do not think she truly had or has colitis.  Also, these recent symptoms are most likely unrelated to the liver mass, even though it has increased in size over the last year.  Plan: MiraLAX half a capful a day, increasing as needed to have a daily bowel movement.  I strongly recommended she be evaluated by her hepatobiliary surgeon regarding the [place] liver mass. Based on its size and location, it might still not be amenable to surgical removal, but surgery should certainly evaluate it.  See me as needed, especially as if symptoms should escalate.  Total time 35 minutes, over half spent face-to-face with patient in counseling and coordination of care.   Nelida Meuse III

## 2018-09-24 ENCOUNTER — Ambulatory Visit: Payer: PRIVATE HEALTH INSURANCE | Admitting: Endocrinology

## 2018-09-24 NOTE — Addendum Note (Signed)
Addended by: Orma Flaming on: 09/24/2018 12:26 PM   Modules accepted: Orders

## 2018-09-24 NOTE — Telephone Encounter (Signed)
Referral placed.  Macala Baldonado, MD [place]   

## 2018-09-28 ENCOUNTER — Other Ambulatory Visit: Payer: Self-pay

## 2018-09-28 NOTE — Progress Notes (Signed)
Erroneous encounter, please disregard

## 2018-10-11 ENCOUNTER — Ambulatory Visit (INDEPENDENT_AMBULATORY_CARE_PROVIDER_SITE_OTHER): Payer: PRIVATE HEALTH INSURANCE | Admitting: Endocrinology

## 2018-10-11 ENCOUNTER — Other Ambulatory Visit: Payer: Self-pay

## 2018-10-11 ENCOUNTER — Encounter: Payer: Self-pay | Admitting: Endocrinology

## 2018-10-11 DIAGNOSIS — E0590 Thyrotoxicosis, unspecified without thyrotoxic crisis or storm: Secondary | ICD-10-CM | POA: Diagnosis not present

## 2018-10-11 NOTE — Progress Notes (Signed)
Patient ID: Misty Murillo, female   DOB: 1990/10/07, 28 y.o.   MRN: [id number]                                                                                                                     Reason for Appointment: Probable hyperthyroidism, follow-up visit       Referring healthcare provider: Orma Flaming   Chief complaint: Follow-up of thyroid   History of Present Illness:    The patient was being evaluated at her annual physical exam in 2/20 symptoms of fatigue, anxiety and depression  She was seen in consultation in 03/2018 On her exam she had no abnormal findings and no goiter  However baseline free T3 was high at 4.6 with undetectable TSH  Since her thyrotropin receptor antibody was high normal she was started on methimazole 5 mg daily  On her follow-up in 04/2018 she had less fatigue although her weight had gone up In July 2020 her thyroid was still consistently normal and methimazole 5 mg was continued  Her weight has been recently going up a little She does think that she is much more tired recently although does not think she has any palpitations, shakiness or anxiety No consistent heat or cold intolerance  Thyroid levels have not been done since 07/22/2018    Wt Readings from Last 3 Encounters:  10/11/18 242 lb 6.4 oz (110 kg)  09/23/18 239 lb 6.4 oz (108.6 kg)  09/15/18 237 lb 9 oz (107.8 kg)     Thyroid function tests as follows:     Lab Results  Component Value Date   FREET4 0.72 07/22/2018   FREET4 0.77 05/17/2018   FREET4 1.05 04/07/2018   T3FREE 3.7 07/22/2018   T3FREE 4.6 (H) 04/07/2018   TSH 1.14 07/22/2018   TSH 0.02 (L) 05/17/2018   TSH <0.01 (L) 04/07/2018    Lab Results  Component Value Date   THYROTRECAB 1.73 04/07/2018     Allergies as of 10/11/2018   No Known Allergies     Medication List       Accurate as of October 11, 2018  4:30 PM. If you have any questions, ask your nurse or doctor.        acetaminophen 325 MG  tablet Commonly known as: TYLENOL Take 650 mg by mouth every 6 (six) hours as needed.   benzonatate 200 MG capsule Commonly known as: TESSALON Take 1 capsule (200 mg total) by mouth 3 (three) times daily as needed.   EPINEPHrine 0.3 mg/0.3 mL Soaj injection Commonly known as: EPI-PEN Inject 0.3 mg into the muscle as needed (allergic reaction).   fluticasone 50 MCG/ACT nasal spray Commonly known as: FLONASE Place 2 sprays into both nostrils daily.   HYDROcodone-acetaminophen 5-325 MG tablet Commonly known as: NORCO/VICODIN Take 1 tablet by mouth every 4 (four) hours as needed.   methimazole 5 MG tablet Commonly known as: TAPAZOLE Take 1 tablet (5 mg total) by mouth daily. TAKE 1 TABLET BY MOUTH ONCE DAILY.  ondansetron 4 MG disintegrating tablet Commonly known as: Zofran ODT Take 1 tablet (4 mg total) by mouth every 8 (eight) hours as needed for nausea or vomiting.   Vitamin D 12.5 MCG/0.25ML Liqd Take 3 mLs by mouth daily. For 12 weeks           Past Medical History:  Diagnosis Date  . Anxiety   . Depression   . Headache   . Liver mass   . Migraines     Past Surgical History:  Procedure Laterality Date  . CHOLECYSTECTOMY    . RESECTION LIVER      Family History  Problem Relation Age of Onset  . Diabetes Mother   . Hyperthyroidism Mother   . Migraines Father   . Cancer Other   . Hyperthyroidism Brother     Social History:  reports that she quit smoking about 8 months ago. Her smoking use included cigars. She smoked 0.00 packs per day. She has never used smokeless tobacco. She reports current drug use. Drug: Marijuana. She reports that she does not drink alcohol.  Allergies: No Known Allergies   Review of Systems  Endocrine: Positive for fatigue. Negative for cold intolerance and heat intolerance.  Psychiatric/Behavioral: Negative for depressed mood and insomnia.    She is on vitamin D supplements   Examination:   BP 110/70 (BP Location: Left  Arm, Patient Position: Sitting, Cuff Size: Large)   Pulse 67   Ht 5\' 3"  (1.6 m)   Wt 242 lb 6.4 oz (110 kg)   SpO2 96%   BMI 42.94 kg/m   Thyroid not palpable Skin appears normal Deep tendon reflexes difficult to elicit but appear normal in relaxation phase No pedal edema         Assessment/Plan:   Hyperthyroidism, from mild Graves' disease especially with her family history being positive  Had baseline symptoms of fatigue and anxiety She has been on 5 mg methimazole since about 03/2018 No previous history of goiter  She has not had any thyroid labs for over 2 months on her treatment However she is complaining of fatigue and not clear if this indicates hypothyroidism Also has a little weight gain now  We will recheck her labs today including repeat thyrotropin receptor antibody Discussed etiology of Graves' disease and likelihood of remission with methimazole alone since she has mild disease  Elayne Snare 10/11/2018, 4:30 PM    Note: This office note was prepared with Dragon voice recognition system technology. Any transcriptional errors that result from this process are unintentional.

## 2018-10-12 ENCOUNTER — Other Ambulatory Visit: Payer: Self-pay

## 2018-10-12 LAB — TSH: TSH: 1.75 u[IU]/mL (ref 0.35–4.50)

## 2018-10-12 LAB — T4, FREE: Free T4: 0.88 ng/dL (ref 0.60–1.60)

## 2018-10-12 LAB — THYROTROPIN RECEPTOR AUTOABS: Thyrotropin Receptor Ab: <1.1 IU/L (ref 0.00–1.75)

## 2018-10-12 MED ORDER — METHIMAZOLE 5 MG PO TABS: ORAL_TABLET | ORAL | 2 refills | Status: DC

## 2018-12-01 ENCOUNTER — Other Ambulatory Visit (INDEPENDENT_AMBULATORY_CARE_PROVIDER_SITE_OTHER): Payer: PRIVATE HEALTH INSURANCE

## 2018-12-01 ENCOUNTER — Other Ambulatory Visit: Payer: Self-pay

## 2018-12-01 DIAGNOSIS — E0590 Thyrotoxicosis, unspecified without thyrotoxic crisis or storm: Secondary | ICD-10-CM | POA: Diagnosis not present

## 2018-12-02 LAB — TSH: TSH: 1.7 u[IU]/mL (ref 0.35–4.50)

## 2018-12-02 LAB — T4, FREE: Free T4: 0.87 ng/dL (ref 0.60–1.60)

## 2018-12-02 LAB — T3, FREE: T3, Free: 3.8 pg/mL (ref 2.3–4.2)

## 2018-12-03 ENCOUNTER — Ambulatory Visit: Payer: PRIVATE HEALTH INSURANCE | Admitting: Endocrinology

## 2018-12-07 ENCOUNTER — Encounter: Payer: Self-pay | Admitting: Endocrinology

## 2018-12-07 ENCOUNTER — Ambulatory Visit (INDEPENDENT_AMBULATORY_CARE_PROVIDER_SITE_OTHER): Payer: PRIVATE HEALTH INSURANCE | Admitting: Endocrinology

## 2018-12-07 ENCOUNTER — Other Ambulatory Visit: Payer: Self-pay

## 2018-12-07 VITALS — BP 120/68 | HR 78 | Ht 63.0 in | Wt 243.4 lb

## 2018-12-07 DIAGNOSIS — E0590 Thyrotoxicosis, unspecified without thyrotoxic crisis or storm: Secondary | ICD-10-CM | POA: Diagnosis not present

## 2018-12-07 NOTE — Progress Notes (Signed)
Patient ID: Misty Murillo, female   DOB: [birth year], 28 y.o.   MRN: [id number]                                                                                                                     Reason for Appointment: Probable hyperthyroidism, follow-up visit     Chief complaint: Follow-up of thyroid   History of Present Illness:    The patient was being evaluated at her annual physical exam in 2/20 symptoms of fatigue, anxiety and depression  She was seen in consultation in 03/2018 On her exam she had no abnormal findings and no goiter  However baseline free T3 was high at 4.6 with undetectable TSH  Since her thyrotropin receptor antibody was high normal she was started on methimazole 5 mg daily  On her follow-up in 04/2018 she had less fatigue although her weight had gone up  In July 2020 her thyroid was still consistently normal and methimazole 5 mg was continued In 09/2018 even with her thyroid levels being about the same her thyrotropin receptor antibody had come back to normal  Since then she has been taking methimazole only 3 days a week  Her weight has been stable She does feel tired at times but no palpitations, anxiety or shakiness   Thyroid levels have not changed with free T4 still quite normal and TSH 1.7   Wt Readings from Last 3 Encounters:  12/07/18 243 lb 6.4 oz (110.4 kg)  10/11/18 242 lb 6.4 oz (110 kg)  09/23/18 239 lb 6.4 oz (108.6 kg)     Thyroid function tests as follows:     Lab Results  Component Value Date   FREET4 0.87 12/01/2018   FREET4 0.88 10/11/2018   FREET4 0.72 07/22/2018   T3FREE 3.8 12/01/2018   T3FREE 3.7 07/22/2018   T3FREE 4.6 (H) 04/07/2018   TSH 1.70 12/01/2018   TSH 1.75 10/11/2018   TSH 1.14 07/22/2018    Lab Results  Component Value Date   THYROTRECAB <1.10 10/11/2018   THYROTRECAB 1.73 04/07/2018     Allergies as of 12/07/2018   No Known Allergies     Medication List       Accurate as of December 07, 2018  8:44 PM. If you have any questions, ask your nurse or doctor.        acetaminophen 325 MG tablet Commonly known as: TYLENOL Take 650 mg by mouth every 6 (six) hours as needed.   benzonatate 200 MG capsule Commonly known as: TESSALON Take 1 capsule (200 mg total) by mouth 3 (three) times daily as needed.   EPINEPHrine 0.3 mg/0.3 mL Soaj injection Commonly known as: EPI-PEN Inject 0.3 mg into the muscle as needed (allergic reaction).   fluticasone 50 MCG/ACT nasal spray Commonly known as: FLONASE Place 2 sprays into both nostrils daily.   HYDROcodone-acetaminophen 5-325 MG tablet Commonly known as: NORCO/VICODIN Take 1 tablet by mouth every 4 (four) hours as needed.   methimazole 5 MG tablet  Commonly known as: TAPAZOLE Take 1 tablet by mouth three times weekly.   ondansetron 4 MG disintegrating tablet Commonly known as: Zofran ODT Take 1 tablet (4 mg total) by mouth every 8 (eight) hours as needed for nausea or vomiting.   Vitamin D 12.5 MCG/0.25ML Liqd Take 3 mLs by mouth daily. For 12 weeks           Past Medical History:  Diagnosis Date  . Anxiety   . Depression   . Headache   . Liver mass   . Migraines     Past Surgical History:  Procedure Laterality Date  . CHOLECYSTECTOMY    . RESECTION LIVER      Family History  Problem Relation Age of Onset  . Diabetes Mother   . Hyperthyroidism Mother   . Migraines Father   . Cancer Other   . Hyperthyroidism Brother     Social History:  reports that she quit smoking about 10 months ago. Her smoking use included cigars. She smoked 0.00 packs per day. She has never used smokeless tobacco. She reports current drug use. Drug: Marijuana. She reports that she does not drink alcohol.  Allergies: No Known Allergies   Review of Systems  She is on vitamin D supplements   Examination:   BP 120/68 (BP Location: Left Arm, Patient Position: Sitting, Cuff Size: Normal)   Pulse 78   Ht 5\' 3"  (1.6 m)   Wt 243 lb  6.4 oz (110.4 kg)   SpO2 98%   BMI 43.12 kg/m   No tremor present  Assessment/Plan:   Hyperthyroidism, from mild Graves' disease especially with her family history being positive  She had baseline symptoms of fatigue and anxiety She has been on 5 mg methimazole since about 03/2018, more recently taking this 3 days a week only No previous history of goiter  Recently has had some nonspecific fatigue, likely unrelated  She has not had any change in her thyroid levels even with reducing her methimazole to 3 days a week instead of daily TSH stable at 1.7  Thyrotropin receptor antibody in 9/20 was down to normal This likely indicates that she is in remission now  She will stop her methimazole and let us know if she has any unusual symptoms such as palpitations or increasing fatigue Follow-up in 2 months   Elayne Snare 12/07/2018, 8:44 PM    Note: This office note was prepared with Dragon voice recognition system technology. Any transcriptional errors that result from this process are unintentional.

## 2018-12-07 NOTE — Patient Instructions (Signed)
Stop Tx

## 2018-12-15 ENCOUNTER — Ambulatory Visit (INDEPENDENT_AMBULATORY_CARE_PROVIDER_SITE_OTHER): Payer: PRIVATE HEALTH INSURANCE | Admitting: Family Medicine

## 2018-12-15 DIAGNOSIS — R110 Nausea: Secondary | ICD-10-CM | POA: Diagnosis not present

## 2018-12-15 DIAGNOSIS — K219 Gastro-esophageal reflux disease without esophagitis: Secondary | ICD-10-CM | POA: Diagnosis not present

## 2018-12-15 MED ORDER — ONDANSETRON 8 MG PO TBDP: 8.0000 mg | ORAL_TABLET | Freq: Three times a day (TID) | ORAL | 0 refills | Status: DC | PRN

## 2018-12-15 MED ORDER — PROMETHAZINE HCL 12.5 MG PO TABS: 12.5000 mg | ORAL_TABLET | Freq: Three times a day (TID) | ORAL | 0 refills | Status: DC | PRN

## 2018-12-15 MED ORDER — PANTOPRAZOLE SODIUM 40 MG PO TBEC: 40.0000 mg | DELAYED_RELEASE_TABLET | Freq: Every day | ORAL | 0 refills | Status: DC

## 2018-12-15 NOTE — Progress Notes (Signed)
   Chief Complaint:  Misty Murillo is a 28 y.o. female who presents today for a virtual office visit with a chief complaint of nasuea.   Assessment/Plan:  Nausea / Reflux Unclear etiology.  Does have a history of liver hemangioma.  Discussed limitations of virtual evaluation and inability to perform physical exam or lab test.  She does not currently have any red flags.  In light of best patient care, will treat symptomatically with Phenergan.  We will also send in Zofran in case Phenergan makes her too drowsy.  We will also start protonix empirically to treat for any possible underlying gastritis or GERD contributing factors.  She will follow-up next week for labs and urine pregnancy test.    Subjective:  HPI:  Nausea  Started a few months ago. Has seen GI about 2 months ago for abdominal bloating, nausea.  She was seen in the ED just prior to this and was diagnosed with colitis.  They thought her symptoms were mostly due to constipation.  They did not think that she had true colitis.  She does have a history of liver mass and has been following with hepatobiliary surgery for this.  Over the last several weeks, her nausea has become more persistent.  Used to be only worse with eating but is now occurring throughout the day.  She had some prescription for Zofran which helped.  She has not taken anything recently.  She does not think that she is pregnant.  She has been consistent with her menstrual cycles and last had her.  A few weeks ago.  No vomiting.  No abdominal pain.  No fevers or chills.  No other treatments tried.  No other obvious aggravating or alleviating factors.  Patient is also concerned about possible GERD.  She will occasionally have brackish reflux in her mouth but has no heartburn or indigestion symptoms.  ROS: Per HPI  PMH: She reports that she quit smoking about 10 months ago. Her smoking use included cigars. She smoked 0.00 packs per day. She has never used smokeless tobacco.  She reports current drug use. Drug: Marijuana. She reports that she does not drink alcohol.      Objective/Observations  Physical Exam: Gen: NAD, resting comfortably Pulm: Normal work of breathing Neuro: Grossly normal, moves all extremities Psych: Normal affect and thought content  No results found for this or any previous visit (from the past 24 hour(s)).   Virtual Visit via Video   I connected with [name] on 12/15/18 at 10:40 AM EST by a video enabled telemedicine application and verified that I am speaking with the correct person using two identifiers. I discussed the limitations of evaluation and management by telemedicine and the availability of in person appointments. The patient expressed understanding and agreed to proceed.   Patient location: Home Provider location: [place] participating in the virtual visit: Myself and Patient     Algis Greenhouse. Jerline Pain, MD 12/15/2018 10:22 AM

## 2019-01-05 ENCOUNTER — Other Ambulatory Visit: Payer: PRIVATE HEALTH INSURANCE

## 2019-02-07 ENCOUNTER — Other Ambulatory Visit: Payer: PRIVATE HEALTH INSURANCE

## 2019-02-08 ENCOUNTER — Other Ambulatory Visit: Payer: PRIVATE HEALTH INSURANCE

## 2019-02-09 ENCOUNTER — Ambulatory Visit: Payer: PRIVATE HEALTH INSURANCE | Attending: Internal Medicine

## 2019-02-09 DIAGNOSIS — Z20822 Contact with and (suspected) exposure to covid-19: Secondary | ICD-10-CM

## 2019-02-10 LAB — NOVEL CORONAVIRUS, NAA: SARS-CoV-2, NAA: NOT DETECTED

## 2019-02-11 ENCOUNTER — Ambulatory Visit: Payer: PRIVATE HEALTH INSURANCE | Admitting: Endocrinology

## 2019-02-11 ENCOUNTER — Other Ambulatory Visit: Payer: PRIVATE HEALTH INSURANCE

## 2019-02-17 ENCOUNTER — Ambulatory Visit: Payer: PRIVATE HEALTH INSURANCE | Admitting: Family Medicine

## 2019-02-17 ENCOUNTER — Encounter: Payer: Self-pay | Admitting: Family Medicine

## 2019-02-17 NOTE — Progress Notes (Signed)
Pt not seen today. Was originally scheduled for virtual visit. She was advised to go to the ED due to chest pain and dizziness.  Algis Greenhouse. Jerline Pain, MD 02/17/2019 12:52 PM

## 2019-03-11 ENCOUNTER — Other Ambulatory Visit: Payer: PRIVATE HEALTH INSURANCE

## 2019-03-16 ENCOUNTER — Ambulatory Visit: Payer: PRIVATE HEALTH INSURANCE | Admitting: Endocrinology

## 2019-03-16 ENCOUNTER — Other Ambulatory Visit: Payer: Self-pay

## 2019-03-25 IMAGING — CT CT ANGIO CHEST
2 of 6 series · 18 of 46 positions shown · IV contrast (Isovue)
Comparison: Chest radiographs earlier this day.

CLINICAL DATA: Syncope. Pulmonary embolus suspected, intermediate
probability. Positive D-dimer.

EXAM:
CT ANGIOGRAPHY CHEST WITH CONTRAST
TECHNIQUE: Multidetector CT imaging of the chest was performed using the
standard protocol during bolus administration of intravenous
contrast. Multiplanar CT image reconstructions and MIPs were
obtained to evaluate the vascular anatomy.
CONTRAST:  100mL TPMXAU-25W IOPAMIDOL (TPMXAU-25W) INJECTION 76%

[Series 6: thins · axial · 0.71mm/px · z∈[+1614,+1802]mm · 15 of 207 slices shown]
[im 9/207  lung]
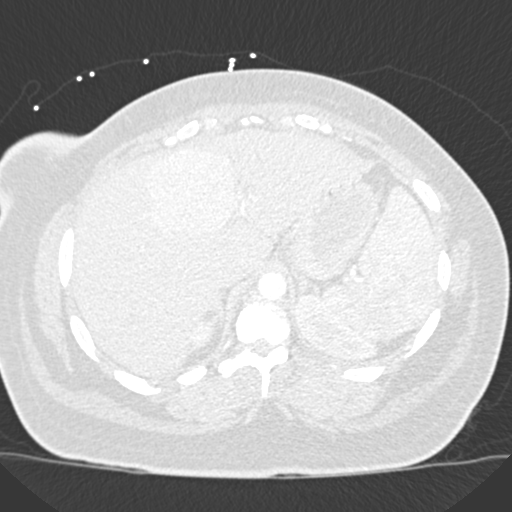
[im 27/207  soft-tissue]
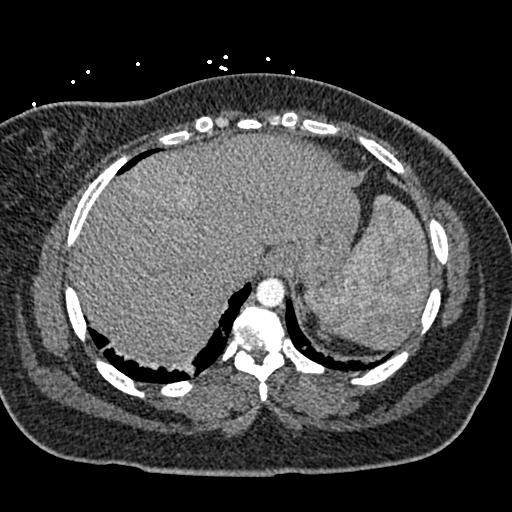
[im 36/207  lung]
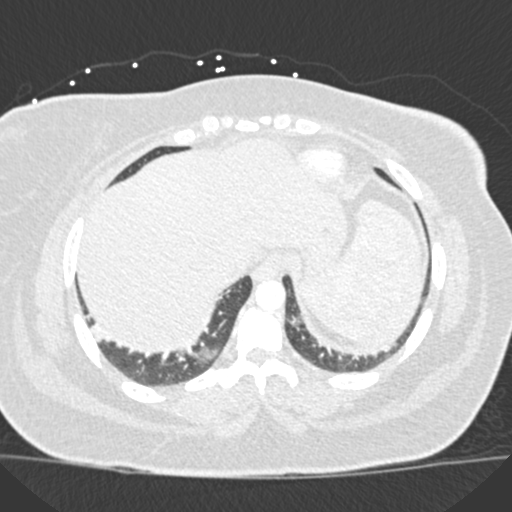
[im 54/207  soft-tissue]
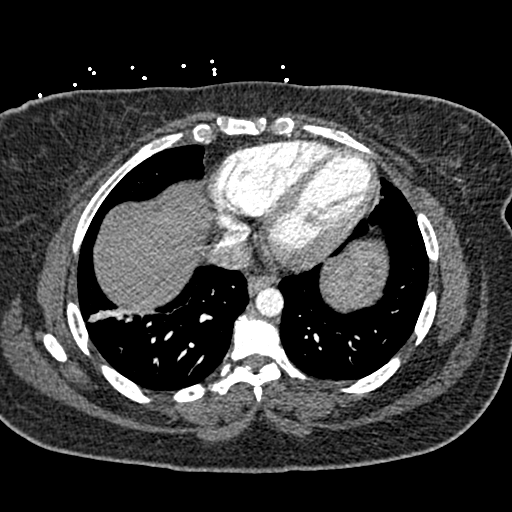
[im 63/207  lung]
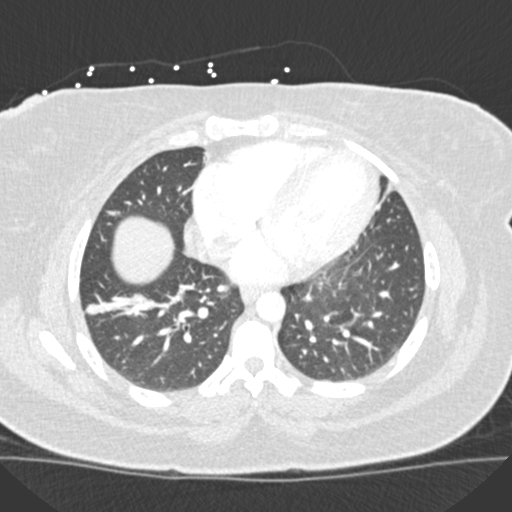
[im 81/207  soft-tissue]
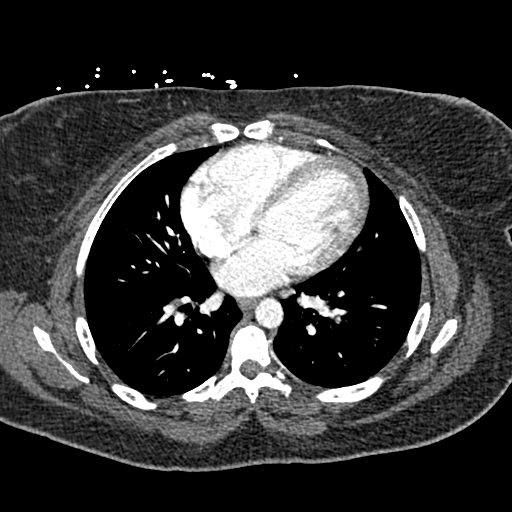
[im 90/207  lung]
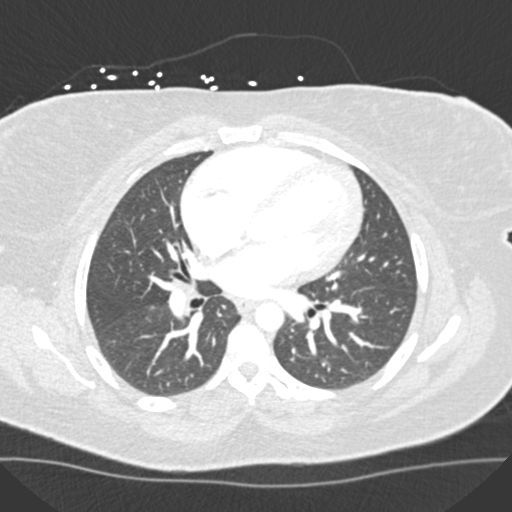
[im 108/207  soft-tissue]
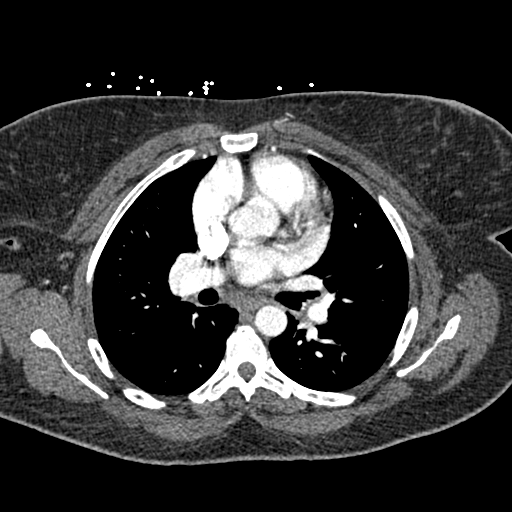
[im 117/207  lung]
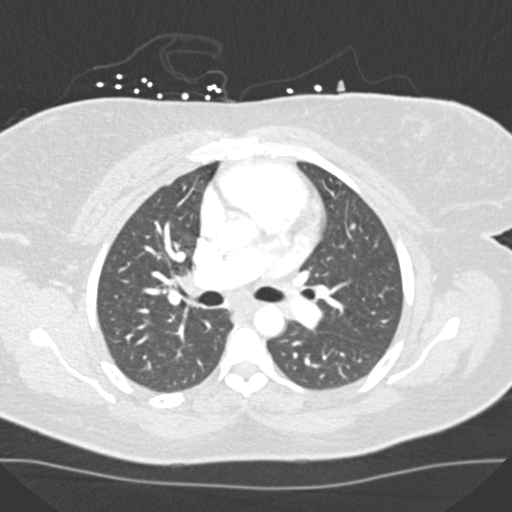
[im 126/207  soft-tissue]
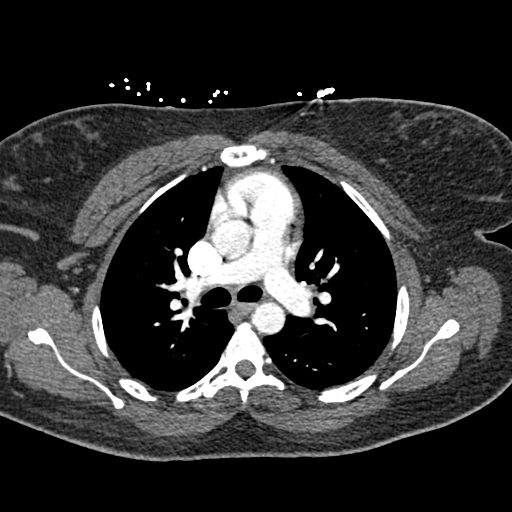
[im 144/207  lung]
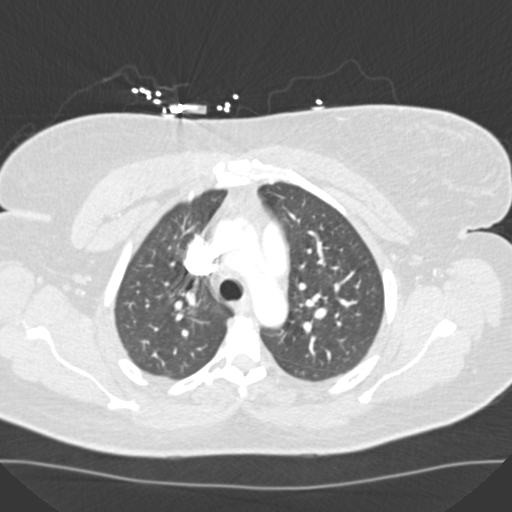
[im 153/207  soft-tissue]
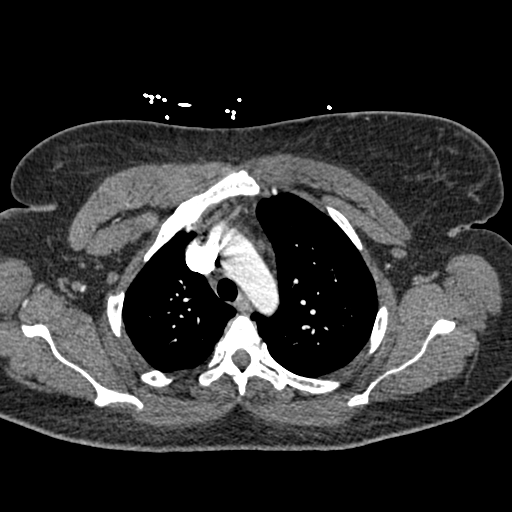
[im 171/207  lung]
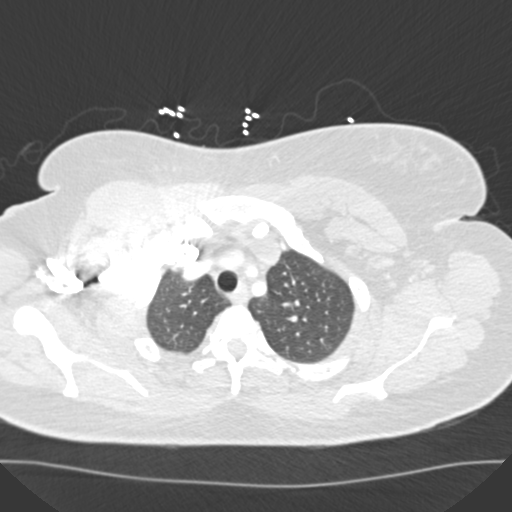
[im 180/207  soft-tissue]
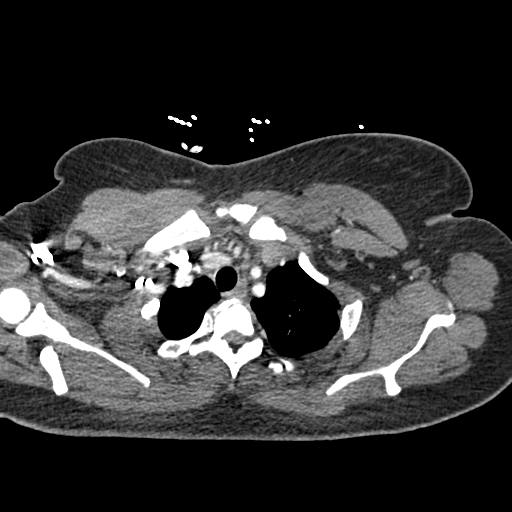
[im 198/207  lung]
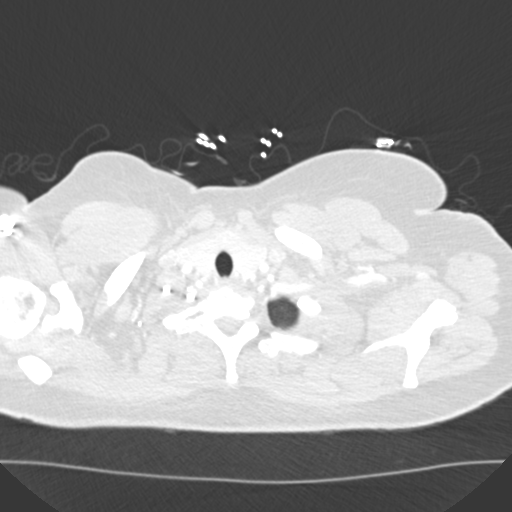

[Series 9: coronal mpr · coronal · 0.44mm/px · 3 of 127 slices shown]
[im 32/127  soft-tissue]
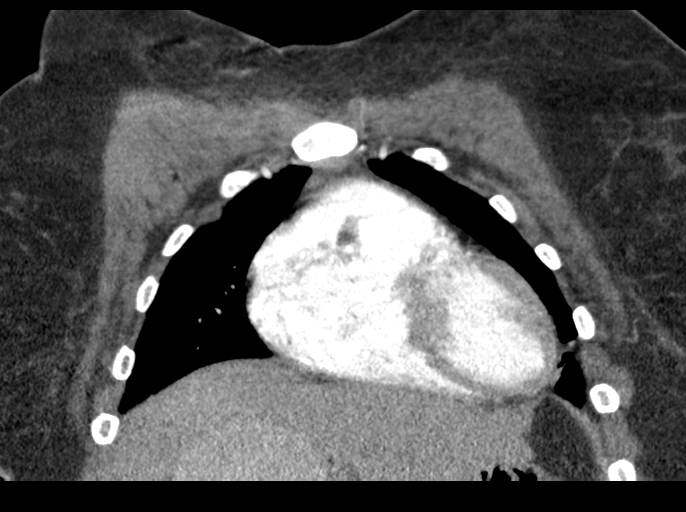
[im 64/127  soft-tissue]
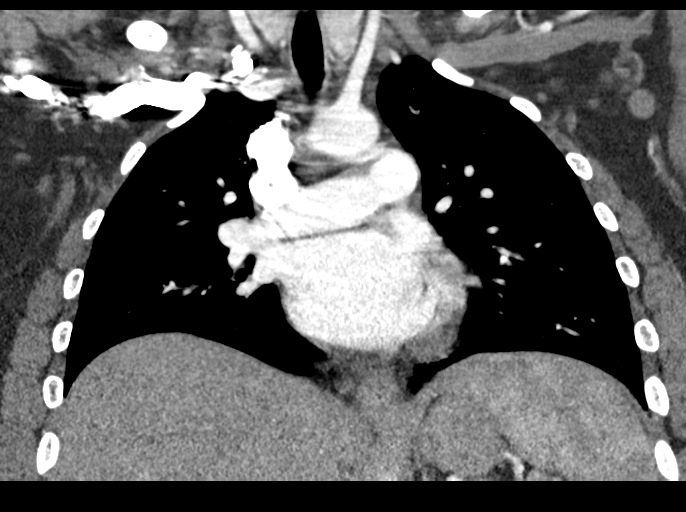
[im 95/127  soft-tissue]
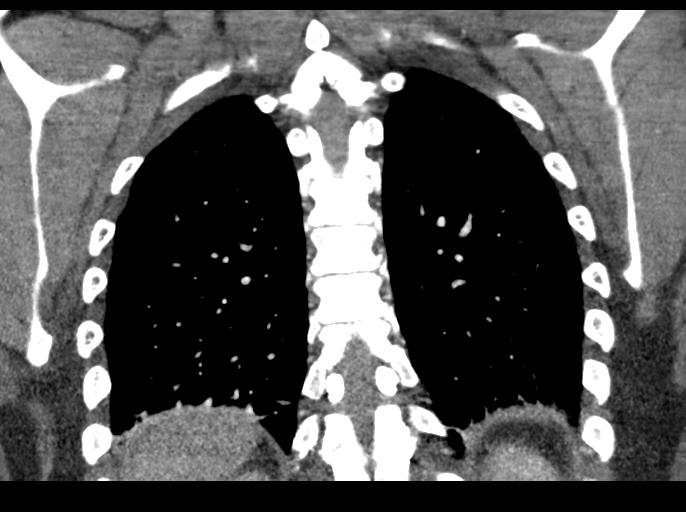

[18 of 46 positions shown; findings below may reference images not displayed]

FINDINGS: Cardiovascular: There are no filling defects within the pulmonary
arteries to suggest pulmonary embolus. Thoracic aorta is normal
caliber without dissection. The heart is normal in size. No
pericardial effusion.

Mediastinum/Nodes: Small mediastinal and hilar nodes not enlarged by
size criteria. Mild thyromegaly without focal thyroid nodule. The
esophagus is decompressed.

Lungs/Pleura: Linear atelectasis in the right lower and middle
lobes. No confluent airspace disease. No pulmonary edema. Pleural
effusion. No pulmonary mass.

Upper Abdomen: Central hyperdense hepatic masses partially included,
not significantly changed from 09/04/2017 CT.

Musculoskeletal: There are no acute or suspicious osseous
abnormalities.

Review of the MIP images confirms the above findings.
IMPRESSION: 1. No pulmonary embolus.
2. Linear atelectasis in the right middle and lower lobes.

## 2019-06-01 ENCOUNTER — Emergency Department (HOSPITAL_COMMUNITY): Payer: BC Managed Care – PPO

## 2019-06-01 ENCOUNTER — Emergency Department (HOSPITAL_COMMUNITY)
Admission: EM | Admit: 2019-06-01 | Discharge: 2019-06-02 | Disposition: A | Discharge status: Home / Self Care | Payer: BC Managed Care – PPO | Attending: Emergency Medicine | Admitting: Emergency Medicine

## 2019-06-01 ENCOUNTER — Encounter (HOSPITAL_COMMUNITY): Payer: Self-pay | Admitting: Emergency Medicine

## 2019-06-01 ENCOUNTER — Other Ambulatory Visit: Payer: Self-pay

## 2019-06-01 DIAGNOSIS — Z87891 Personal history of nicotine dependence: Secondary | ICD-10-CM | POA: Insufficient documentation

## 2019-06-01 DIAGNOSIS — R05 Cough: Secondary | ICD-10-CM | POA: Diagnosis present

## 2019-06-01 DIAGNOSIS — Z20822 Contact with and (suspected) exposure to covid-19: Secondary | ICD-10-CM | POA: Insufficient documentation

## 2019-06-01 DIAGNOSIS — Z79899 Other long term (current) drug therapy: Secondary | ICD-10-CM | POA: Diagnosis not present

## 2019-06-01 DIAGNOSIS — J069 Acute upper respiratory infection, unspecified: Secondary | ICD-10-CM | POA: Insufficient documentation

## 2019-06-01 LAB — POC URINE PREG, ED: Preg Test, Ur: NEGATIVE

## 2019-06-01 MED ORDER — LIDOCAINE VISCOUS HCL 2 % MT SOLN
15.0000 mL | Freq: Once | OROMUCOSAL | Status: AC
  Administered 2019-06-01: 15 mL via OROMUCOSAL
  Filled 2019-06-01: qty 15

## 2019-06-01 MED ORDER — ONDANSETRON 4 MG PO TBDP
4.0000 mg | ORAL_TABLET | Freq: Once | ORAL | Status: AC
  Administered 2019-06-01: 4 mg via ORAL
  Filled 2019-06-01: qty 1

## 2019-06-01 MED ORDER — ACETAMINOPHEN 325 MG PO TABS
650.0000 mg | ORAL_TABLET | Freq: Once | ORAL | Status: AC
  Administered 2019-06-01: 650 mg via ORAL
  Filled 2019-06-01: qty 2

## 2019-06-01 NOTE — ED Triage Notes (Signed)
Patient states cough and congestion that started a few days ago. Patient also having chills, chest pain, sore throat, and body aches

## 2019-06-01 NOTE — ED Notes (Addendum)
Pt aware of need for urine specimen. Pt states she urinated 15 minutes prior.

## 2019-06-01 NOTE — ED Provider Notes (Signed)
[facility] EMERGENCY DEPARTMENT Provider Note   CSN: [id number] Arrival date & time: 06/01/19  1934     History Chief Complaint  Patient presents with  . Cough    nausea    Misty Murillo is a 29 y.o. female with past medical history significant for anxiety, depression, migraines, hyperthyroidism presents to emergency department today with chief complaint of cough x3 days.  Patient states her cough is productive with yellow sputum.  She also endorses generalized body aches, shortness of breath and chest pain.  She states after coughing episodes is when she feels short of breath, chest soreness, and nausea.  She has tried taking Mucinex without any symptom relief.  She states her son had a recent URI no other sick family members.  She denies fever, chills, congestion,hemoptysis, abdominal pain, urinary symptoms, diarrhea, rash, headache, neck pain, loss of sense of taste or smell.  Patient denies any known contact with anyone positive for COVID-19 however works in a [place] office it was unsure if she has been exposed unknowingly. Patient did not receive covid vaccinations.    Past Medical History:  Diagnosis Date  . Anxiety   . Depression   . Headache   . Liver mass   . Migraines     Patient Active Problem List   Diagnosis Date Noted  . Hyperthyroidism 03/08/2018  . Vitamin D deficiency 03/08/2018  . Sickle cell trait ([place]) 05/26/2017  . Liver mass, left lobe 05/18/2017    Past Surgical History:  Procedure Laterality Date  . CHOLECYSTECTOMY    . RESECTION LIVER       OB History    Gravida  2   Para  1   Term  1   Preterm  0   AB  0   Living  1     SAB  0   TAB  0   Ectopic  0   Multiple  0   Live Births  1           Family History  Problem Relation Age of Onset  . Diabetes Mother   . Hyperthyroidism Mother   . Migraines Father   . Cancer Other   . Hyperthyroidism Brother     Social History   Tobacco Use  . Smoking status: Former  Smoker    Packs/day: 0.00    Types: Cigars    Quit date: 01/27/2018    Years since quitting: 1.3  . Smokeless tobacco: Never Used  . Tobacco comment: started back  Substance Use Topics  . Alcohol use: No    Comment: rarely   . Drug use: Yes    Types: Marijuana    Home Medications Prior to Admission medications   Medication Sig Start Date End Date Taking? Authorizing Provider  acetaminophen (TYLENOL) 325 MG tablet Take 650 mg by mouth every 6 (six) hours as needed.    [provider]  benzonatate (TESSALON) 100 MG capsule Take 1 capsule (100 mg total) by mouth every 8 (eight) hours as needed for cough. 06/02/19   Marko Skalski E, PA-C  EPINEPHrine 0.3 mg/0.3 mL IJ SOAJ injection Inject 0.3 mg into the muscle as needed (allergic reaction).  02/16/17   [provider]  fluticasone (FLONASE) 50 MCG/ACT nasal spray Place 2 sprays into both nostrils daily. 05/21/18   Orma Flaming, MD  lidocaine (XYLOCAINE) 2 % solution Use as directed 15 mLs in the mouth or throat as needed for mouth pain. 06/02/19   Samina Weekes, [facility]  E, PA-C  methimazole (TAPAZOLE) 5 MG tablet Take 1 tablet by mouth three times weekly. Patient not taking: Reported on 12/15/2018 10/12/18   Elayne Snare, MD  ondansetron (ZOFRAN-ODT) 8 MG disintegrating tablet Take 1 tablet (8 mg total) by mouth every 8 (eight) hours as needed for nausea or vomiting. Patient not taking: Reported on 02/17/2019 12/15/18   Vivi Barrack, MD  pantoprazole (PROTONIX) 40 MG tablet Take 1 tablet (40 mg total) by mouth daily. Patient not taking: Reported on 02/17/2019 12/15/18   Vivi Barrack, MD  promethazine (PHENERGAN) 12.5 MG tablet Take 1 tablet (12.5 mg total) by mouth every 8 (eight) hours as needed for nausea or vomiting. Patient not taking: Reported on 02/17/2019 12/15/18   Vivi Barrack, MD  Vitamin D 12.5 MCG/0.25ML LIQD Take 3 mLs by mouth daily. For 12 weeks Patient not taking: Reported on 02/17/2019 03/15/18   Orma Flaming, MD    Allergies    Patient has no known allergies.  Review of Systems   Review of Systems All other systems are reviewed and are negative for acute change except as noted in the HPI.  Physical Exam Updated Vital Signs Pulse 85   Temp 99 F (37.2 C) (Oral)   Resp 18   Ht 5\' 4"  (1.626 m)   Wt 106.1 kg   LMP 05/30/2019   SpO2 100%   BMI 40.17 kg/m   Physical Exam Vitals and nursing note reviewed.  Constitutional:      General: She is not in acute distress.    Appearance: She is not ill-appearing.  HENT:     Head: Normocephalic and atraumatic.     Comments: No sinus or temporal tenderness.  Minor erythema to oropharynx, no edema, no exudate, 2+ bilateral tonsillar swelling, voice normal, neck supple without lymphadenopathy     Right Ear: Tympanic membrane and external ear normal.     Left Ear: Tympanic membrane and external ear normal.     Nose: Nose normal.     Mouth/Throat:     Mouth: Mucous membranes are moist.     Pharynx: Oropharynx is clear. Uvula midline. No uvula swelling.  Eyes:     General: No scleral icterus.       Right eye: No discharge.        Left eye: No discharge.     Extraocular Movements: Extraocular movements intact.     Conjunctiva/sclera: Conjunctivae normal.     Pupils: Pupils are equal, round, and reactive to light.  Neck:     Vascular: No JVD.  Cardiovascular:     Rate and Rhythm: Normal rate and regular rhythm.     Pulses: Normal pulses.          Radial pulses are 2+ on the right side and 2+ on the left side.     Heart sounds: Normal heart sounds.  Pulmonary:     Comments: Lungs clear to auscultation in all fields. Symmetric chest rise. No wheezing, rales, or rhonchi.  Patient has dry hacking cough during exam.  Oxygen saturation is 99% on room air. Chest:     Chest wall: No tenderness.  Abdominal:     Tenderness: There is no right CVA tenderness or left CVA tenderness.     Comments: Abdomen is soft, non-distended, and  non-tender in all quadrants. No rigidity, no guarding. No peritoneal signs.  Musculoskeletal:        General: Normal range of motion.     Cervical back: Normal range  of motion.     Comments: Homans sign absent bilaterally, no lower extremity edema, no palpable cords, compartments are soft  Skin:    General: Skin is warm and dry.     Capillary Refill: Capillary refill takes less than 2 seconds.  Neurological:     Mental Status: She is oriented to person, place, and time.     GCS: GCS eye subscore is 4. GCS verbal subscore is 5. GCS motor subscore is 6.     Comments: Fluent speech, no facial droop.  Psychiatric:        Behavior: Behavior normal.     ED Results / Procedures / Treatments   Labs (all labs ordered are listed, but only abnormal results are displayed) Labs Reviewed  GROUP A STREP BY PCR  SARS CORONAVIRUS 2 (TAT 6-24 HRS)  POC URINE PREG, ED    EKG EKG Interpretation  Date/Time:  Wednesday Jun 01 2019 19:58:30 EDT Ventricular Rate:  85 PR Interval:  196 QRS Duration: 84 QT Interval:  362 QTC Calculation: 430 R Axis:   56 Text Interpretation: Normal sinus rhythm Cannot rule out Anterior infarct , age undetermined Abnormal ECG Confirmed by Milton Ferguson [id number] on 06/01/2019 10:40:24 PM   Radiology DG Chest Portable 1 View  Result Date: 06/01/2019 CLINICAL DATA:  Cough EXAM: PORTABLE CHEST 1 VIEW COMPARISON:  None. FINDINGS: The heart size and mediastinal contours are within normal limits. Both lungs are clear. The visualized skeletal structures are unremarkable. IMPRESSION: No active disease. Electronically Signed   By: Ulyses Jarred M.D.   On: 06/01/2019 22:59    Procedures Procedures (including critical care time)  Medications Ordered in ED Medications  acetaminophen (TYLENOL) tablet 650 mg (650 mg Oral Given 06/01/19 2323)  lidocaine (XYLOCAINE) 2 % viscous mouth solution 15 mL (15 mLs Mouth/Throat Given 06/01/19 2323)  ondansetron (ZOFRAN-ODT)  disintegrating tablet 4 mg (4 mg Oral Given 06/01/19 2323)  dexamethasone (DECADRON) tablet 8 mg (8 mg Oral Given 06/02/19 0018)  chlorpheniramine-HYDROcodone (TUSSIONEX) 10-8 MG/5ML suspension 5 mL (5 mLs Oral Given 06/02/19 0018)    ED Course  I have reviewed the triage vital signs and the nursing notes.  Pertinent labs & imaging results that were available during my care of the patient were reviewed by me and considered in my medical decision making (see chart for details).    MDM Rules/Calculators/A&P                     History provided by patient with additional history obtained from chart review.     Patient seen and examined. Patient presents awake, alert, hemodynamically stable, afebrile, non toxic. No tachycardia or hypoxia. Patient looks to not feel well however is not septic in appearance. Lungs are clear to ascultation in all fields. She has normal work of breathing. She has bilateral tonsillar swelling and erythema to posterior oropharynx. Presentation not concerning for PTA or Ludwig's angina, Uvulitis, epiglottitis, peritonsillar abscess, or retropharyngeal abscess.  Strep test is negative. I viewed pt's chest xray and it does not suggest acute infectious processes. I viewed EKG that was performed in triage and it shows no obvious ischemia. Urine pregnancy test is negative. Engaged in shared decision making with patient regarding lab work and she would like to hold off on labs. I agree with this plan as history and exam are not suggestive of ACS or PE.  Patient given decadron, tussionex, tylenol and viscous lidocaine. On reassessment she admits symptoms have improved. She is  tolerating PO ion take and feels that she can manage her symptoms at home. Covid test is pending and patient is aware she will need to quarantine until she has test result.   The patient appears reasonably screened and/or stabilized for discharge and I doubt any other medical condition or other [facility] requiring  further screening, evaluation, or treatment in the ED at this time prior to discharge. The patient is safe for discharge with strict return precautions discussed. Recommend pcp follow up.  Misty Murillo was evaluated in Emergency Department on 06/02/2019 for the symptoms described in the history of present illness. She was evaluated in the context of the global COVID-19 pandemic, which necessitated consideration that the patient might be at risk for infection with the SARS-CoV-2 virus that causes COVID-19. Institutional protocols and algorithms that pertain to the evaluation of patients at risk for COVID-19 are in a state of rapid change based on information released by regulatory bodies including the CDC and federal and state organizations. These policies and algorithms were followed during the patient's care in the ED.    Portions of this note were generated with [employer]. Dictation errors may occur despite best attempts at proofreading.   Final Clinical Impression(s) / ED Diagnoses Final diagnoses:  Upper respiratory tract infection, unspecified type    Rx / DC Orders ED Discharge Orders         Ordered    benzonatate (TESSALON) 100 MG capsule  Every 8 hours PRN     06/02/19 0022    lidocaine (XYLOCAINE) 2 % solution  As needed     06/02/19 0022           Dineen Conradt, Harley Hallmark, PA-C 06/02/19 0045    Milton Ferguson, MD 06/02/19 1553

## 2019-06-02 LAB — GROUP A STREP BY PCR: Group A Strep by PCR: NOT DETECTED

## 2019-06-02 LAB — SARS CORONAVIRUS 2 (TAT 6-24 HRS): SARS Coronavirus 2: NEGATIVE

## 2019-06-02 MED ORDER — BENZONATATE 100 MG PO CAPS
100.0000 mg | ORAL_CAPSULE | Freq: Three times a day (TID) | ORAL | 0 refills | Status: DC | PRN
Start: 2019-06-02 — End: 2019-12-03

## 2019-06-02 MED ORDER — LIDOCAINE VISCOUS HCL 2 % MT SOLN
15.0000 mL | OROMUCOSAL | 0 refills | Status: DC | PRN
Start: 2019-06-02 — End: 2019-12-03

## 2019-06-02 MED ORDER — DEXAMETHASONE 4 MG PO TABS
8.0000 mg | ORAL_TABLET | Freq: Once | ORAL | Status: AC
  Administered 2019-06-02: 8 mg via ORAL
  Filled 2019-06-02: qty 2

## 2019-06-02 MED ORDER — HYDROCOD POLST-CPM POLST ER 10-8 MG/5ML PO SUER
5.0000 mL | Freq: Once | ORAL | Status: AC
  Administered 2019-06-02: 5 mL via ORAL
  Filled 2019-06-02: qty 5

## 2019-06-02 NOTE — Discharge Instructions (Addendum)
You were seen in the ED for viral illness.  I suspect you have a virus. We tested your for COVID-19 (coronavirus) infection.  It is also possible you could have other viral upper respiratory infection from another virus.    Prescription sent to your pharmacy for tessalon perles. This is a cough medicine, Prescription also sent for viscous lidocaine which should help with your sore throat.  Test results come back in 48 hours, sometimes sooner.  Someone will call you if ypu are positive for COVID. If the result is negative you can see it on your MyChart.  Treatment of your illness and symptoms will include self-isolation, monitoring of symptoms and supportive care with over-the-counter medicines.    Return to the ED if there is increased work of breathing, shortness of breath, inability to tolerate fluids, weakness, chest pain.  If your test results are POSITIVE, the following isolation requirements need to be met to return to work and resume essential activities: At least 10 days since symptom onset  72 hours of absence of fever without antifever medicine (ibuprofen, acetaminophen). A fever is temperature of 100.84F or greater. Improvement of respiratory symptoms  If your test is NEGATIVE, you may return to work and essential activities as long as your symptoms have improved and you do not have a fever for a total of 3 days.  Call your job and notify them that your test result was negative to see if they will allow you to return to work.   Stay well-hydrated. Rest. You can use over the counter medications to help with symptoms: 600 mg ibuprofen (motrin, aleve, advil) or acetaminophen (tylenol) every 6 hours, around the clock to help with associated fevers, sore throat, headaches, generalized body aches and malaise.  Oxymetazoline (afrin) intranasal spray once daily for no more than 3 days to help with congestion, after 3 days you can switch to another over-the-counter nasal steroid spray such as  fluticasone (flonase) Allergy medication (loratadine, cetirizine, etc) and phenylephrine (sudafed) help with nasal congestion, runny nose and postnasal drip.   Dextromethorphan (Delsym) to suppress dry cough. Frequent coughing is likely causing your chest wall pain Wash your hands often to prevent spread.

## 2019-07-21 DIAGNOSIS — O99210 Obesity complicating pregnancy, unspecified trimester: Secondary | ICD-10-CM | POA: Insufficient documentation

## 2019-07-21 DIAGNOSIS — O30049 Twin pregnancy, dichorionic/diamniotic, unspecified trimester: Secondary | ICD-10-CM | POA: Insufficient documentation

## 2019-07-21 DIAGNOSIS — O30009 Twin pregnancy, unspecified number of placenta and unspecified number of amniotic sacs, unspecified trimester: Secondary | ICD-10-CM | POA: Insufficient documentation

## 2019-07-21 DIAGNOSIS — N730 Acute parametritis and pelvic cellulitis: Secondary | ICD-10-CM

## 2019-07-21 HISTORY — DX: Acute parametritis and pelvic cellulitis: N73.0

## 2019-07-26 ENCOUNTER — Emergency Department (HOSPITAL_COMMUNITY)
Admission: EM | Admit: 2019-07-26 | Discharge: 2019-07-26 | Disposition: A | Discharge status: Home / Self Care | Payer: BC Managed Care – PPO | Attending: Emergency Medicine | Admitting: Emergency Medicine

## 2019-07-26 ENCOUNTER — Other Ambulatory Visit: Payer: Self-pay

## 2019-07-26 ENCOUNTER — Encounter (HOSPITAL_COMMUNITY): Payer: Self-pay | Admitting: Emergency Medicine

## 2019-07-26 DIAGNOSIS — Z5321 Procedure and treatment not carried out due to patient leaving prior to being seen by health care provider: Secondary | ICD-10-CM | POA: Diagnosis not present

## 2019-07-26 DIAGNOSIS — R1030 Lower abdominal pain, unspecified: Secondary | ICD-10-CM | POA: Diagnosis present

## 2019-07-26 LAB — CBC
HCT: 36.8 % (ref 36.0–46.0)
Hemoglobin: 12.5 g/dL (ref 12.0–15.0)
MCH: 27.5 pg (ref 26.0–34.0)
MCHC: 34 g/dL (ref 30.0–36.0)
MCV: 81.1 fL (ref 80.0–100.0)
Platelets: 326 10*3/uL (ref 150–400)
RBC: 4.54 MIL/uL (ref 3.87–5.11)
RDW: 14.7 % (ref 11.5–15.5)
WBC: 8.8 10*3/uL (ref 4.0–10.5)
nRBC: 0 % (ref 0.0–0.2)

## 2019-07-26 LAB — COMPREHENSIVE METABOLIC PANEL
ALT: 18 U/L (ref 0–44)
AST: 23 U/L (ref 15–41)
Albumin: 4.1 g/dL (ref 3.5–5.0)
Alkaline Phosphatase: 76 U/L (ref 38–126)
Anion gap: 9 (ref 5–15)
BUN: 8 mg/dL (ref 6–20)
CO2: 26 mmol/L (ref 22–32)
Calcium: 9.2 mg/dL (ref 8.9–10.3)
Chloride: 101 mmol/L (ref 98–111)
Creatinine, Ser: 0.68 mg/dL (ref 0.44–1.00)
GFR calc Af Amer: >60 mL/min (ref >60)
GFR calc non Af Amer: >60 mL/min (ref >60)
Glucose, Bld: 96 mg/dL (ref 70–99)
Potassium: 3.6 mmol/L (ref 3.5–5.1)
Sodium: 136 mmol/L (ref 135–145)
Total Bilirubin: 0.3 mg/dL (ref 0.3–1.2)
Total Protein: 8.3 g/dL — ABNORMAL HIGH (ref 6.5–8.1)

## 2019-07-26 LAB — LIPASE, BLOOD: Lipase: 18 U/L (ref 11–51)

## 2019-07-26 MED ORDER — SODIUM CHLORIDE 0.9% FLUSH: 3.0000 mL | Freq: Once | INTRAVENOUS | Status: DC

## 2019-07-26 NOTE — ED Triage Notes (Signed)
Pt c/o lower abdominal pressure and back pain that began for a week. N/V the day before yesterday for one episode.

## 2019-07-28 ENCOUNTER — Encounter (HOSPITAL_COMMUNITY): Payer: Self-pay | Admitting: Emergency Medicine

## 2019-07-28 ENCOUNTER — Other Ambulatory Visit: Payer: Self-pay

## 2019-07-28 ENCOUNTER — Emergency Department (HOSPITAL_COMMUNITY)
Admission: EM | Admit: 2019-07-28 | Discharge: 2019-07-29 | Disposition: A | Discharge status: Home / Self Care | Payer: BC Managed Care – PPO | Attending: Emergency Medicine | Admitting: Emergency Medicine

## 2019-07-28 DIAGNOSIS — R112 Nausea with vomiting, unspecified: Secondary | ICD-10-CM | POA: Insufficient documentation

## 2019-07-28 DIAGNOSIS — R1030 Lower abdominal pain, unspecified: Secondary | ICD-10-CM | POA: Diagnosis present

## 2019-07-28 DIAGNOSIS — Z5321 Procedure and treatment not carried out due to patient leaving prior to being seen by health care provider: Secondary | ICD-10-CM | POA: Diagnosis not present

## 2019-07-28 LAB — CBC
HCT: 35.1 % — ABNORMAL LOW (ref 36.0–46.0)
Hemoglobin: 11.7 g/dL — ABNORMAL LOW (ref 12.0–15.0)
MCH: 26.6 pg (ref 26.0–34.0)
MCHC: 33.3 g/dL (ref 30.0–36.0)
MCV: 79.8 fL — ABNORMAL LOW (ref 80.0–100.0)
Platelets: 299 10*3/uL (ref 150–400)
RBC: 4.4 MIL/uL (ref 3.87–5.11)
RDW: 14.3 % (ref 11.5–15.5)
WBC: 9.1 10*3/uL (ref 4.0–10.5)
nRBC: 0 % (ref 0.0–0.2)

## 2019-07-28 LAB — COMPREHENSIVE METABOLIC PANEL
ALT: 16 U/L (ref 0–44)
AST: 18 U/L (ref 15–41)
Albumin: 3.7 g/dL (ref 3.5–5.0)
Alkaline Phosphatase: 81 U/L (ref 38–126)
Anion gap: 10 (ref 5–15)
BUN: 8 mg/dL (ref 6–20)
CO2: 24 mmol/L (ref 22–32)
Calcium: 9.3 mg/dL (ref 8.9–10.3)
Chloride: 103 mmol/L (ref 98–111)
Creatinine, Ser: 0.81 mg/dL (ref 0.44–1.00)
GFR calc Af Amer: >60 mL/min (ref >60)
GFR calc non Af Amer: >60 mL/min (ref >60)
Glucose, Bld: 93 mg/dL (ref 70–99)
Potassium: 3.6 mmol/L (ref 3.5–5.1)
Sodium: 137 mmol/L (ref 135–145)
Total Bilirubin: 0.3 mg/dL (ref 0.3–1.2)
Total Protein: 7.8 g/dL (ref 6.5–8.1)

## 2019-07-28 LAB — LIPASE, BLOOD: Lipase: 20 U/L (ref 11–51)

## 2019-07-28 LAB — I-STAT BETA HCG BLOOD, ED (MC, WL, AP ONLY): I-stat hCG, quantitative: <5 m[IU]/mL (ref <5)

## 2019-07-28 MED ORDER — SODIUM CHLORIDE 0.9% FLUSH: 3.0000 mL | Freq: Once | INTRAVENOUS | Status: DC

## 2019-07-28 NOTE — ED Triage Notes (Signed)
Pt c/o lower abd pain radiating to her back with nausea and vomiting.

## 2019-07-28 NOTE — ED Notes (Signed)
Pt left stated that she was in too much pain to continue to seat here and just wait.

## 2019-07-30 ENCOUNTER — Emergency Department (HOSPITAL_COMMUNITY): Payer: BC Managed Care – PPO

## 2019-07-30 ENCOUNTER — Emergency Department (HOSPITAL_COMMUNITY)
Admission: EM | Admit: 2019-07-30 | Discharge: 2019-07-30 | Disposition: A | Discharge status: Home / Self Care | Payer: BC Managed Care – PPO | Attending: Emergency Medicine | Admitting: Emergency Medicine

## 2019-07-30 ENCOUNTER — Encounter (HOSPITAL_COMMUNITY): Payer: Self-pay | Admitting: *Deleted

## 2019-07-30 ENCOUNTER — Other Ambulatory Visit: Payer: Self-pay

## 2019-07-30 DIAGNOSIS — A599 Trichomoniasis, unspecified: Secondary | ICD-10-CM | POA: Diagnosis not present

## 2019-07-30 DIAGNOSIS — E039 Hypothyroidism, unspecified: Secondary | ICD-10-CM | POA: Insufficient documentation

## 2019-07-30 DIAGNOSIS — N730 Acute parametritis and pelvic cellulitis: Secondary | ICD-10-CM

## 2019-07-30 DIAGNOSIS — N939 Abnormal uterine and vaginal bleeding, unspecified: Secondary | ICD-10-CM | POA: Diagnosis not present

## 2019-07-30 DIAGNOSIS — F17290 Nicotine dependence, other tobacco product, uncomplicated: Secondary | ICD-10-CM | POA: Diagnosis not present

## 2019-07-30 DIAGNOSIS — N739 Female pelvic inflammatory disease, unspecified: Secondary | ICD-10-CM | POA: Insufficient documentation

## 2019-07-30 DIAGNOSIS — R102 Pelvic and perineal pain: Secondary | ICD-10-CM | POA: Diagnosis not present

## 2019-07-30 DIAGNOSIS — R1030 Lower abdominal pain, unspecified: Secondary | ICD-10-CM | POA: Diagnosis present

## 2019-07-30 LAB — COMPREHENSIVE METABOLIC PANEL
ALT: 13 U/L (ref 0–44)
AST: 16 U/L (ref 15–41)
Albumin: 3.7 g/dL (ref 3.5–5.0)
Alkaline Phosphatase: 81 U/L (ref 38–126)
Anion gap: 7 (ref 5–15)
BUN: 10 mg/dL (ref 6–20)
CO2: 23 mmol/L (ref 22–32)
Calcium: 8.9 mg/dL (ref 8.9–10.3)
Chloride: 106 mmol/L (ref 98–111)
Creatinine, Ser: 0.69 mg/dL (ref 0.44–1.00)
GFR calc Af Amer: >60 mL/min (ref >60)
GFR calc non Af Amer: >60 mL/min (ref >60)
Glucose, Bld: 102 mg/dL — ABNORMAL HIGH (ref 70–99)
Potassium: 3.8 mmol/L (ref 3.5–5.1)
Sodium: 136 mmol/L (ref 135–145)
Total Bilirubin: 0.3 mg/dL (ref 0.3–1.2)
Total Protein: 7.8 g/dL (ref 6.5–8.1)

## 2019-07-30 LAB — WET PREP, GENITAL
Sperm: NONE SEEN
Yeast Wet Prep HPF POC: NONE SEEN

## 2019-07-30 LAB — URINALYSIS, ROUTINE W REFLEX MICROSCOPIC
Bacteria, UA: NONE SEEN
Bilirubin Urine: NEGATIVE
Glucose, UA: NEGATIVE mg/dL
Ketones, ur: NEGATIVE mg/dL
Nitrite: NEGATIVE
Protein, ur: NEGATIVE mg/dL
Specific Gravity, Urine: 1.011 (ref 1.005–1.030)
pH: 5 (ref 5.0–8.0)

## 2019-07-30 LAB — CBC
HCT: 34.5 % — ABNORMAL LOW (ref 36.0–46.0)
Hemoglobin: 11.5 g/dL — ABNORMAL LOW (ref 12.0–15.0)
MCH: 27.1 pg (ref 26.0–34.0)
MCHC: 33.3 g/dL (ref 30.0–36.0)
MCV: 81.2 fL (ref 80.0–100.0)
Platelets: 293 10*3/uL (ref 150–400)
RBC: 4.25 MIL/uL (ref 3.87–5.11)
RDW: 14.3 % (ref 11.5–15.5)
WBC: 8.6 10*3/uL (ref 4.0–10.5)
nRBC: 0 % (ref 0.0–0.2)

## 2019-07-30 LAB — PREGNANCY, URINE: Preg Test, Ur: NEGATIVE

## 2019-07-30 LAB — LIPASE, BLOOD: Lipase: 17 U/L (ref 11–51)

## 2019-07-30 MED ORDER — SODIUM CHLORIDE 0.9% FLUSH
3.0000 mL | Freq: Once | INTRAVENOUS | Status: DC
  Administered 2019-07-30: 3 mL via INTRAVENOUS

## 2019-07-30 MED ORDER — DOXYCYCLINE HYCLATE 100 MG PO TABS
100.0000 mg | ORAL_TABLET | Freq: Once | ORAL | Status: AC
  Administered 2019-07-30: 100 mg via ORAL
  Filled 2019-07-30: qty 1

## 2019-07-30 MED ORDER — METRONIDAZOLE 500 MG PO TABS
500.0000 mg | ORAL_TABLET | Freq: Two times a day (BID) | ORAL | 0 refills | Status: DC
Start: 2019-07-30 — End: 2019-12-03

## 2019-07-30 MED ORDER — IOHEXOL 300 MG/ML  SOLN
100.0000 mL | Freq: Once | INTRAMUSCULAR | Status: AC | PRN
  Administered 2019-07-30: 100 mL via INTRAVENOUS

## 2019-07-30 MED ORDER — METRONIDAZOLE 500 MG PO TABS
500.0000 mg | ORAL_TABLET | Freq: Once | ORAL | Status: AC
  Administered 2019-07-30: 500 mg via ORAL
  Filled 2019-07-30: qty 1

## 2019-07-30 MED ORDER — LIDOCAINE HCL (PF) 1 % IJ SOLN
INTRAMUSCULAR | Status: AC
  Filled 2019-07-30: qty 5

## 2019-07-30 MED ORDER — CEFTRIAXONE SODIUM 500 MG IJ SOLR
500.0000 mg | Freq: Once | INTRAMUSCULAR | Status: AC
  Administered 2019-07-30: 500 mg via INTRAMUSCULAR
  Filled 2019-07-30: qty 500

## 2019-07-30 MED ORDER — DOXYCYCLINE HYCLATE 100 MG PO CAPS
100.0000 mg | ORAL_CAPSULE | Freq: Two times a day (BID) | ORAL | 0 refills | Status: DC
Start: 2019-07-30 — End: 2019-12-03

## 2019-07-30 NOTE — ED Provider Notes (Signed)
[facility] Provider Note   CSN: [id number] Arrival date & time: 07/30/19  1359     History Chief Complaint  Patient presents with  . Abdominal Pain    Misty Murillo is a 29 y.o. female with a history as outlined below, significant for history of known uterine fibroids and liver hemangiomas status post partial liver excision, sickle cell trait, hypothyroidism presenting with a 2-week history of lower abdominal pain which she describes as pressure, sometimes sharp with radiation around to her lower bilateral back.  She does endorse some discomfort with urination and had her urine checked yesterday at work [employer]) which was equivocal for uti (she states a culture was sent), but was started on keflex.  She denies fevers, chills, diarrhea. She denies n/v and upper abd pain. No history of kidney stones.  No vaginal dc.  She has had some mild vaginal spotting since yesterday.  Sexually active with same partner x 5 months. No improvement yet with the keflex.   The history is provided by the patient.       Past Medical History:  Diagnosis Date  . Anxiety   . Depression   . Headache   . Liver mass   . Migraines     Patient Active Problem List   Diagnosis Date Noted  . Hyperthyroidism 03/08/2018  . Vitamin D deficiency 03/08/2018  . Sickle cell trait ([place]) 05/26/2017  . Liver mass, left lobe 05/18/2017    Past Surgical History:  Procedure Laterality Date  . CHOLECYSTECTOMY    . RESECTION LIVER       OB History    Gravida  2   Para  1   Term  1   Preterm  0   AB  0   Living  1     SAB  0   TAB  0   Ectopic  0   Multiple  0   Live Births  1           Family History  Problem Relation Age of Onset  . Diabetes Mother   . Hyperthyroidism Mother   . Migraines Father   . Cancer Other   . Hyperthyroidism Brother     Social History   Tobacco Use  . Smoking status: Current Every Day Smoker    Packs/day: 0.00    Types:  Cigars    Last attempt to quit: 01/27/2018    Years since quitting: 1.5  . Smokeless tobacco: Never Used  . Tobacco comment: started back  Substance Use Topics  . Alcohol use: No    Comment: rarely   . Drug use: Yes    Frequency: 2.0 times per week    Types: Marijuana    Home Medications Prior to Admission medications   Medication Sig Start Date End Date Taking? Authorizing Provider  acetaminophen (TYLENOL) 325 MG tablet Take 650 mg by mouth every 6 (six) hours as needed.    [provider]  benzonatate (TESSALON) 100 MG capsule Take 1 capsule (100 mg total) by mouth every 8 (eight) hours as needed for cough. 06/02/19   Albrizze, Kaitlyn E, PA-C  doxycycline (VIBRAMYCIN) 100 MG capsule Take 1 capsule (100 mg total) by mouth 2 (two) times daily. 07/30/19   Evalee Jefferson, PA-C  EPINEPHrine 0.3 mg/0.3 mL IJ SOAJ injection Inject 0.3 mg into the muscle as needed (allergic reaction).  02/16/17   [provider]  fluticasone (FLONASE) 50 MCG/ACT nasal spray Place 2 sprays into  both nostrils daily. 05/21/18   Orma Flaming, MD  lidocaine (XYLOCAINE) 2 % solution Use as directed 15 mLs in the mouth or throat as needed for mouth pain. 06/02/19   Albrizze, Kaitlyn E, PA-C  methimazole (TAPAZOLE) 5 MG tablet Take 1 tablet by mouth three times weekly. Patient not taking: Reported on 12/15/2018 10/12/18   Elayne Snare, MD  metroNIDAZOLE (FLAGYL) 500 MG tablet Take 1 tablet (500 mg total) by mouth 2 (two) times daily. 07/30/19   Evalee Jefferson, PA-C  ondansetron (ZOFRAN-ODT) 8 MG disintegrating tablet Take 1 tablet (8 mg total) by mouth every 8 (eight) hours as needed for nausea or vomiting. Patient not taking: Reported on 02/17/2019 12/15/18   Vivi Barrack, MD  pantoprazole (PROTONIX) 40 MG tablet Take 1 tablet (40 mg total) by mouth daily. Patient not taking: Reported on 02/17/2019 12/15/18   Vivi Barrack, MD  promethazine (PHENERGAN) 12.5 MG tablet Take 1 tablet (12.5 mg total) by mouth  every 8 (eight) hours as needed for nausea or vomiting. Patient not taking: Reported on 02/17/2019 12/15/18   Vivi Barrack, MD  Vitamin D 12.5 MCG/0.25ML LIQD Take 3 mLs by mouth daily. For 12 weeks Patient not taking: Reported on 02/17/2019 03/15/18   Orma Flaming, MD    Allergies    Patient has no known allergies.  Review of Systems   Review of Systems  Constitutional: Negative for chills and fever.  HENT: Negative for congestion and sore throat.   Eyes: Negative.   Respiratory: Negative for chest tightness and shortness of breath.   Cardiovascular: Negative for chest pain.  Gastrointestinal: Negative for abdominal pain, nausea and vomiting.  Genitourinary: Positive for dysuria, pelvic pain and vaginal bleeding. Negative for vaginal discharge and vaginal pain.  Musculoskeletal: Negative for arthralgias, joint swelling and neck pain.  Skin: Negative.  Negative for rash and wound.  Neurological: Negative for dizziness, weakness, light-headedness, numbness and headaches.  Psychiatric/Behavioral: Negative.     Physical Exam Updated Vital Signs BP 118/66 (BP Location: Right Arm)   Pulse 72   Temp 98.9 F (37.2 C) (Oral)   Resp 14   Ht 5\' 4"  (1.626 m)   Wt 108.9 kg   LMP 07/01/2019   SpO2 100%   BMI 41.20 kg/m   Physical Exam Vitals and nursing note reviewed. Exam conducted with a chaperone present.  Constitutional:      Appearance: She is well-developed.  HENT:     Head: Normocephalic and atraumatic.  Eyes:     Conjunctiva/sclera: Conjunctivae normal.  Cardiovascular:     Rate and Rhythm: Normal rate and regular rhythm.     Heart sounds: Normal heart sounds.  Pulmonary:     Effort: Pulmonary effort is normal.     Breath sounds: Normal breath sounds. No wheezing.  Abdominal:     General: Bowel sounds are normal.     Palpations: Abdomen is soft.     Tenderness: There is abdominal tenderness in the right lower quadrant, suprapubic area and left lower quadrant. There  is no guarding.  Genitourinary:    Cervix: Cervical motion tenderness and cervical bleeding present. No discharge, friability or erythema.     Uterus: Enlarged and tender.      Adnexa:        Right: Tenderness present. No mass or fullness.         Left: Tenderness present. No mass or fullness.       Comments: Small amount of blood in vagina and around  cervix. No appreciable discharge.  Musculoskeletal:        General: Normal range of motion.     Cervical back: Normal range of motion.  Skin:    General: Skin is warm and dry.  Neurological:     Mental Status: She is alert.     ED Results / Procedures / Treatments   Labs (all labs ordered are listed, but only abnormal results are displayed) Labs Reviewed  WET PREP, GENITAL - Abnormal; Notable for the following components:      Result Value   Trich, Wet Prep PRESENT (*)    Clue Cells Wet Prep HPF POC PRESENT (*)    WBC, Wet Prep HPF POC FEW (*)    All other components within normal limits  COMPREHENSIVE METABOLIC PANEL - Abnormal; Notable for the following components:   Glucose, Bld 102 (*)    All other components within normal limits  CBC - Abnormal; Notable for the following components:   Hemoglobin 11.5 (*)    HCT 34.5 (*)    All other components within normal limits  URINALYSIS, ROUTINE W REFLEX MICROSCOPIC - Abnormal; Notable for the following components:   Hgb urine dipstick LARGE (*)    Leukocytes,Ua TRACE (*)    All other components within normal limits  LIPASE, BLOOD  PREGNANCY, URINE  GC/CHLAMYDIA PROBE AMP ([place]) NOT AT [facility]    EKG None  Radiology CT ABDOMEN PELVIS W CONTRAST  Result Date: 07/30/2019 CLINICAL DATA:  Lower abdominal pain. EXAM: CT ABDOMEN AND PELVIS WITH CONTRAST TECHNIQUE: Multidetector CT imaging of the abdomen and pelvis was performed using the standard protocol following bolus administration of intravenous contrast. CONTRAST:  116mL OMNIPAQUE IOHEXOL 300 MG/ML  SOLN COMPARISON:  CT  scan 09/15/2018 and MRI 08/20/2018 FINDINGS: Lower chest: Stable streaky basilar scarring changes. No infiltrates or effusions. The heart is normal in size. No pericardial effusion. Hepatobiliary: Large exophytic solid left hepatic lobe lesion is again demonstrated. This measures approximately 10.0 x 8.0 cm and is unchanged. MR findings were most consistent with benign FNH. No new hepatic lesions or intrahepatic biliary dilatation. Pancreas: No mass, inflammation or ductal dilatation. Spleen: Normal size.  No focal lesions. Adrenals/Urinary Tract: The adrenal glands and kidneys are unremarkable and stable. The bladder appears normal. Stomach/Bowel: The stomach, duodenum, small bowel and colon are grossly normal without oral contrast. No acute inflammatory changes, mass lesions or obstructive findings. The terminal ileum and appendix are normal. Vascular/Lymphatic: The aorta and branch vessels are normal. The major venous structures are patent. Stable small periportal lymph nodes and scattered retroperitoneal lymph nodes. Reproductive: The uterus and ovaries are unremarkable. There is a E left-sided fundal fibroid measuring 3.3 cm. This previously measured 2.8 cm. Other: No significant free pelvic fluid collections or pelvic adenopathy. No inguinal adenopathy or hernia. Musculoskeletal: No significant bony findings. IMPRESSION: 1. No acute abdominal/pelvic findings, mass lesions or adenopathy. 2. Stable large exophytic solid left hepatic lobe lesion, most consistent with benign FNH. 3. Slight interval enlargement of a left-sided fundal fibroid. Electronically Signed   By: Marijo Sanes M.D.   On: 07/30/2019 17:16    Procedures Procedures (including critical care time)  Medications Ordered in ED Medications  iohexol (OMNIPAQUE) 300 MG/ML solution 100 mL (100 mLs Intravenous Contrast Given 07/30/19 1700)  cefTRIAXone (ROCEPHIN) injection 500 mg (500 mg Intramuscular Given 07/30/19 1845)  metroNIDAZOLE (FLAGYL)  tablet 500 mg (500 mg Oral Given 07/30/19 1843)  doxycycline (VIBRA-TABS) tablet 100 mg (100 mg Oral Given 07/30/19  1843)    ED Course  I have reviewed the triage vital signs and the nursing notes.  Pertinent labs & imaging results that were available during my care of the patient were reviewed by me and considered in my medical decision making (see chart for details).    MDM Rules/Calculators/A&P                          Pt with pelvic pain, no complaint of vaginal dc but with trichomonas on wet prep and significant tenderness on bimanual exam of uterus and both adnexa without palpable mass. Doubt toa.  Cervix without erythema or dc.  Given she is positive for trichomonas, I feels she would be best covered for gc and chlamydia as well, cultures pending.  She was given IM rocephin, started on doxycycline and flagyl, given PID protocol doxy given bilateral adnexal tenderness, not just CMT.  She will dc the keflex she started yesterday.  Advised her partner needs to be treated for trichomonas (and gc/chlam) if these are positive. Avoid intercourse until abx completed, sx free and partner treated.  Return precautions discussed. Plan f/u with pcp if sx are not resolving over the next week or for any worsened sx.    Final Clinical Impression(s) / ED Diagnoses Final diagnoses:  Trichomonas infection  PID (acute pelvic inflammatory disease)    Rx / DC Orders ED Discharge Orders         Ordered    metroNIDAZOLE (FLAGYL) 500 MG tablet  2 times daily     Discontinue  Reprint     07/30/19 1833    doxycycline (VIBRAMYCIN) 100 MG capsule  2 times daily     Discontinue  Reprint     07/30/19 1833           Evalee Jefferson, PA-C 07/31/19 1252    Maudie Flakes, MD 07/31/19 [phone]

## 2019-07-30 NOTE — Discharge Instructions (Signed)
As discussed, your CT scan is normal today, however your exam does show significant tenderness over your uterus and your left ovary suggesting there may be an infection as we discussed.  Your gonorrhea and Chlamydia cultures are currently pending but should result within the next 48 hours.  You have been treated for these possible infections today with the antibiotic shot and the doxycycline which you have been prescribed.  You also have trichomonas which needs a 7-day treatment with the metronidazole which was prescribed you.  Take your next dose of this medication tomorrow morning.  Your partner will need to be treated for trichomonas or you will continue to get infected with this bacteria.  Additionally if your gonorrhea or Chlamydia cultures are positive he will also need to be treated for that as well.  Abstain from sex until your antibiotics are completed and your symptoms are resolved.  Get rechecked by your primary doctor if your symptoms persist.  You do not have a urinary tract infection today, the Keflex you are currently taken can be stopped.

## 2019-07-30 NOTE — ED Triage Notes (Signed)
Pt with pressure to lower abd and hip area.  Pt with pink spotting that comes and goes since Friday morning.  Went to [facility] the other day but left due to long wait.  Pt currently on antibiotics for UTI since yesterday.  But pain has been going on for 2 weeks.

## 2019-08-01 LAB — GC/CHLAMYDIA PROBE AMP (~~LOC~~) NOT AT ARMC
Chlamydia: POSITIVE — AB
Comment: NEGATIVE
Comment: NORMAL
Neisseria Gonorrhea: NEGATIVE

## 2019-11-29 ENCOUNTER — Encounter: Payer: Self-pay | Admitting: *Deleted

## 2019-12-03 ENCOUNTER — Encounter (HOSPITAL_COMMUNITY): Payer: Self-pay | Admitting: Obstetrics and Gynecology

## 2019-12-03 ENCOUNTER — Inpatient Hospital Stay (HOSPITAL_COMMUNITY)
Admission: AD | Admit: 2019-12-03 | Discharge: 2019-12-03 | Disposition: A | Discharge status: Home / Self Care | Payer: BC Managed Care – PPO | Attending: Obstetrics and Gynecology | Admitting: Obstetrics and Gynecology

## 2019-12-03 ENCOUNTER — Other Ambulatory Visit: Payer: Self-pay

## 2019-12-03 DIAGNOSIS — O210 Mild hyperemesis gravidarum: Secondary | ICD-10-CM

## 2019-12-03 DIAGNOSIS — O30041 Twin pregnancy, dichorionic/diamniotic, first trimester: Secondary | ICD-10-CM | POA: Diagnosis not present

## 2019-12-03 DIAGNOSIS — E0500 Thyrotoxicosis with diffuse goiter without thyrotoxic crisis or storm: Secondary | ICD-10-CM | POA: Insufficient documentation

## 2019-12-03 DIAGNOSIS — Z87891 Personal history of nicotine dependence: Secondary | ICD-10-CM | POA: Diagnosis not present

## 2019-12-03 DIAGNOSIS — Z79899 Other long term (current) drug therapy: Secondary | ICD-10-CM | POA: Insufficient documentation

## 2019-12-03 DIAGNOSIS — O219 Vomiting of pregnancy, unspecified: Secondary | ICD-10-CM | POA: Diagnosis not present

## 2019-12-03 DIAGNOSIS — O99281 Endocrine, nutritional and metabolic diseases complicating pregnancy, first trimester: Secondary | ICD-10-CM | POA: Diagnosis not present

## 2019-12-03 DIAGNOSIS — Z3A09 9 weeks gestation of pregnancy: Secondary | ICD-10-CM | POA: Insufficient documentation

## 2019-12-03 HISTORY — DX: Thyrotoxicosis with diffuse goiter without thyrotoxic crisis or storm: E05.00

## 2019-12-03 LAB — URINALYSIS, ROUTINE W REFLEX MICROSCOPIC
Bilirubin Urine: NEGATIVE
Glucose, UA: NEGATIVE mg/dL
Hgb urine dipstick: NEGATIVE
Ketones, ur: NEGATIVE mg/dL
Leukocytes,Ua: NEGATIVE
Nitrite: NEGATIVE
Protein, ur: NEGATIVE mg/dL
Specific Gravity, Urine: 1.012 (ref 1.005–1.030)
pH: 5 (ref 5.0–8.0)

## 2019-12-03 LAB — POCT PREGNANCY, URINE: Preg Test, Ur: POSITIVE — AB

## 2019-12-03 MED ORDER — PROMETHAZINE HCL 12.5 MG PO TABS: 12.5000 mg | ORAL_TABLET | Freq: Four times a day (QID) | ORAL | 2 refills | Status: DC | PRN

## 2019-12-03 MED ORDER — ONDANSETRON 4 MG PO TBDP
4.0000 mg | ORAL_TABLET | Freq: Four times a day (QID) | ORAL | 0 refills | Status: DC | PRN
Start: 2019-12-03 — End: 2019-12-19

## 2019-12-03 MED ORDER — METOCLOPRAMIDE HCL 10 MG PO TABS
10.0000 mg | ORAL_TABLET | Freq: Three times a day (TID) | ORAL | 2 refills | Status: DC | PRN
Start: 2019-12-03 — End: 2019-12-19

## 2019-12-03 NOTE — MAU Note (Signed)
Patient reports excessive vomiting since 1600, unable to keep anything down at all.  Patient reports she is pregnant with twins (DD 6/12). Also reports some occasional lower abdominal cramping.  Denies VB.  Denies being on any meds.

## 2019-12-03 NOTE — MAU Provider Note (Signed)
Chief Complaint: Emesis   None     SUBJECTIVE HPI: Misty Murillo is a 29 y.o. G3P1011 at [redacted]w[redacted]d with di/di twins pregnancy by early ultrasound who presents to maternity admissions reporting nausea with vomiting 5-6 times in 24 hours.  She was nauseous but able to keep down food and fluids before today.  She does not have any nausea medications. She started care at [facility] and had normal ultrasound showing FHR for both twins.  There are no other symptoms, she has tried popsicles, crackers, ice chips but they are not helping.   HPI  Past Medical History:  Diagnosis Date  . Anxiety   . Depression   . Graves disease   . Headache   . Liver mass   . Migraines    Past Surgical History:  Procedure Laterality Date  . CHOLECYSTECTOMY    . RESECTION LIVER     Social History   Socioeconomic History  . Marital status: Single    Spouse name: Not on file  . Number of children: Not on file  . Years of education: Not on file  . Highest education level: Not on file  Occupational History  . Not on file  Tobacco Use  . Smoking status: Former Smoker    Packs/day: 0.00    Types: Cigars    Quit date: 01/27/2018    Years since quitting: 1.8  . Smokeless tobacco: Never Used  . Tobacco comment: started back  Substance and Sexual Activity  . Alcohol use: No    Comment: rarely   . Drug use: Not Currently    Frequency: 2.0 times per week    Types: Marijuana  . Sexual activity: Not Currently    Birth control/protection: None  Other Topics Concern  . Not on file  Social History Narrative  . Not on file   Social Determinants of Health   Financial Resource Strain:   . Difficulty of Paying Living Expenses: Not on file  Food Insecurity:   . Worried About Charity fundraiser in the Last Year: Not on file  . Ran Out of Food in the Last Year: Not on file  Transportation Needs:   . Lack of Transportation (Medical): Not on file  . Lack of Transportation (Non-Medical): Not on file   Physical Activity:   . Days of Exercise per Week: Not on file  . Minutes of Exercise per Session: Not on file  Stress:   . Feeling of Stress : Not on file  Social Connections:   . Frequency of Communication with Friends and Family: Not on file  . Frequency of Social Gatherings with Friends and Family: Not on file  . Attends Religious Services: Not on file  . Active Member of Clubs or Organizations: Not on file  . Attends Archivist Meetings: Not on file  . Marital Status: Not on file  Intimate Partner Violence:   . Fear of Current or Ex-Partner: Not on file  . Emotionally Abused: Not on file  . Physically Abused: Not on file  . Sexually Abused: Not on file   No current facility-administered medications on file prior to encounter.   Current Outpatient Medications on File Prior to Encounter  Medication Sig Dispense Refill  . Prenatal Vit-Fe Fumarate-FA (MULTIVITAMIN-PRENATAL) 27-0.8 MG TABS tablet Take 1 tablet by mouth daily at 12 noon.    Marland Kitchen acetaminophen (TYLENOL) 325 MG tablet Take 650 mg by mouth every 6 (six) hours as needed.    Marland Kitchen  benzonatate (TESSALON) 100 MG capsule Take 1 capsule (100 mg total) by mouth every 8 (eight) hours as needed for cough. 21 capsule 0  . doxycycline (VIBRAMYCIN) 100 MG capsule Take 1 capsule (100 mg total) by mouth 2 (two) times daily. 28 capsule 0  . EPINEPHrine 0.3 mg/0.3 mL IJ SOAJ injection Inject 0.3 mg into the muscle as needed (allergic reaction).   1  . fluticasone (FLONASE) 50 MCG/ACT nasal spray Place 2 sprays into both nostrils daily. 16 g 6  . lidocaine (XYLOCAINE) 2 % solution Use as directed 15 mLs in the mouth or throat as needed for mouth pain. 100 mL 0  . methimazole (TAPAZOLE) 5 MG tablet Take 1 tablet by mouth three times weekly. (Patient not taking: Reported on 12/15/2018) 12 tablet 2  . metroNIDAZOLE (FLAGYL) 500 MG tablet Take 1 tablet (500 mg total) by mouth 2 (two) times daily. 14 tablet 0  . ondansetron (ZOFRAN-ODT) 8  MG disintegrating tablet Take 1 tablet (8 mg total) by mouth every 8 (eight) hours as needed for nausea or vomiting. (Patient not taking: Reported on 02/17/2019) 30 tablet 0  . pantoprazole (PROTONIX) 40 MG tablet Take 1 tablet (40 mg total) by mouth daily. (Patient not taking: Reported on 02/17/2019) 30 tablet 0  . promethazine (PHENERGAN) 12.5 MG tablet Take 1 tablet (12.5 mg total) by mouth every 8 (eight) hours as needed for nausea or vomiting. (Patient not taking: Reported on 02/17/2019) 20 tablet 0  . Vitamin D 12.5 MCG/0.25ML LIQD Take 3 mLs by mouth daily. For 12 weeks (Patient not taking: Reported on 02/17/2019) 84 mL 3   No Known Allergies  ROS:  Review of Systems  Constitutional: Negative for chills, fatigue and fever.  Respiratory: Negative for shortness of breath.   Cardiovascular: Negative for chest pain.  Gastrointestinal: Positive for nausea and vomiting.  Genitourinary: Negative for difficulty urinating, dysuria, flank pain, pelvic pain, vaginal bleeding, vaginal discharge and vaginal pain.  Neurological: Negative for dizziness and headaches.  Psychiatric/Behavioral: Negative.      I have reviewed patient's Past Medical Hx, Surgical Hx, Family Hx, Social Hx, medications and allergies.   Physical Exam   Patient Vitals for the past 24 hrs:  BP Temp Pulse Resp Weight  12/03/19 0410 130/74 98.1 F (36.7 C) 77 19 109 kg   Constitutional: Well-developed, well-nourished female in no acute distress.  Cardiovascular: normal rate Respiratory: normal effort GI: Abd soft, non-tender. Pos BS x 4 MS: Extremities nontender, no edema, normal ROM Neurologic: Alert and oriented x 4.  GU: Neg CVAT.  PELVIC EXAM: Deferred   LAB RESULTS Results for orders placed or performed during the hospital encounter of 12/03/19 (from the past 24 hour(s))  Pregnancy, urine POC     Status: Abnormal   Collection Time: 12/03/19  4:19 AM  Result Value Ref Range   Preg Test, Ur POSITIVE (A)  NEGATIVE  Urinalysis, Routine w reflex microscopic Urine, Clean Catch     Status: None   Collection Time: 12/03/19  4:21 AM  Result Value Ref Range   Color, Urine YELLOW YELLOW   APPearance CLEAR CLEAR   Specific Gravity, Urine 1.012 1.005 - 1.030   pH 5.0 5.0 - 8.0   Glucose, UA NEGATIVE NEGATIVE mg/dL   Hgb urine dipstick NEGATIVE NEGATIVE   Bilirubin Urine NEGATIVE NEGATIVE   Ketones, ur NEGATIVE NEGATIVE mg/dL   Protein, ur NEGATIVE NEGATIVE mg/dL   Nitrite NEGATIVE NEGATIVE   Leukocytes,Ua NEGATIVE NEGATIVE  IMAGING No results found.  MAU Management/MDM: Orders Placed This Encounter  Procedures  . Urinalysis, Routine w reflex microscopic  . Pregnancy, urine POC  . Discharge patient    Meds ordered this encounter  Medications  . metoCLOPramide (REGLAN) 10 MG tablet    Sig: Take 1 tablet (10 mg total) by mouth 3 (three) times daily with meals as needed for nausea.    Dispense:  90 tablet    Refill:  2    Order Specific Question:   Supervising Provider    Answer:   Sloan Leiter [[id number]]  . promethazine (PHENERGAN) 12.5 MG tablet    Sig: Take 1-2 tablets (12.5-25 mg total) by mouth every 6 (six) hours as needed for nausea or vomiting.    Dispense:  30 tablet    Refill:  2    Order Specific Question:   Supervising Provider    Answer:   Sloan Leiter [[id number]]  . ondansetron (ZOFRAN ODT) 4 MG disintegrating tablet    Sig: Take 1 tablet (4 mg total) by mouth every 6 (six) hours as needed for nausea.    Dispense:  20 tablet    Refill:  0    Order Specific Question:   Supervising Provider    Answer:   Sloan Leiter [[id number]]    Given UA without evidence of dehydration, pt likely keeping down some fluids/food or was keeping down enough before the last few hours.  Offered PO medications in MAU but pt would like to be discharged so Rx written for Reglan TID at mealtimes, Phenergan Q HS. Zofran to use sparingly for breakthrough symptoms.  Discussed with the patient  the risk of Zofran use in the first trimester of pregnancy. Risks of use in the first trimester include birth defects in babies, specifically cleft lip/palate.  Pt states understanding and plans to use Zofran sparingly.  F/U with [facility] as scheduled.  Return to MAU as needed for emergencies.    ASSESSMENT 1. Nausea and vomiting during pregnancy prior to [redacted] weeks gestation   2. Dichorionic diamniotic twin pregnancy in first trimester     PLAN Discharge home Allergies as of 12/03/2019   No Known Allergies     Medication List    STOP taking these medications   acetaminophen 325 MG tablet Commonly known as: TYLENOL   benzonatate 100 MG capsule Commonly known as: TESSALON   doxycycline 100 MG capsule Commonly known as: VIBRAMYCIN   lidocaine 2 % solution Commonly known as: XYLOCAINE   methimazole 5 MG tablet Commonly known as: TAPAZOLE   metroNIDAZOLE 500 MG tablet Commonly known as: FLAGYL     TAKE these medications   EPINEPHrine 0.3 mg/0.3 mL Soaj injection Commonly known as: EPI-PEN Inject 0.3 mg into the muscle as needed (allergic reaction).   fluticasone 50 MCG/ACT nasal spray Commonly known as: FLONASE Place 2 sprays into both nostrils daily.   metoCLOPramide 10 MG tablet Commonly known as: REGLAN Take 1 tablet (10 mg total) by mouth 3 (three) times daily with meals as needed for nausea.   multivitamin-prenatal 27-0.8 MG Tabs tablet Take 1 tablet by mouth daily at 12 noon.   ondansetron 4 MG disintegrating tablet Commonly known as: Zofran ODT Take 1 tablet (4 mg total) by mouth every 6 (six) hours as needed for nausea. What changed:   medication strength  how much to take  when to take this  reasons to take this   pantoprazole 40 MG tablet Commonly known  as: Protonix Take 1 tablet (40 mg total) by mouth daily.   promethazine 12.5 MG tablet Commonly known as: PHENERGAN Take 1-2 tablets (12.5-25 mg total) by mouth every 6 (six) hours  as needed for nausea or vomiting. What changed:   how much to take  when to take this   Vitamin D 12.5 MCG/0.25ML Liqd Take 3 mLs by mouth daily. For 12 weeks       Follow-up Information    [place] Follow up.   Contact information: [address] [place] [address] [phone]               Fatima Blank Certified Nurse-Midwife 12/03/2019  5:29 AM

## 2019-12-04 ENCOUNTER — Encounter (HOSPITAL_COMMUNITY): Payer: Self-pay

## 2019-12-04 ENCOUNTER — Other Ambulatory Visit: Payer: Self-pay

## 2019-12-04 ENCOUNTER — Emergency Department (HOSPITAL_COMMUNITY)
Admission: EM | Admit: 2019-12-04 | Discharge: 2019-12-04 | Disposition: A | Discharge status: Home / Self Care | Payer: Medicaid Other | Attending: Emergency Medicine | Admitting: Emergency Medicine

## 2019-12-04 DIAGNOSIS — Z3A10 10 weeks gestation of pregnancy: Secondary | ICD-10-CM | POA: Insufficient documentation

## 2019-12-04 DIAGNOSIS — Z3A09 9 weeks gestation of pregnancy: Secondary | ICD-10-CM | POA: Diagnosis not present

## 2019-12-04 DIAGNOSIS — O99281 Endocrine, nutritional and metabolic diseases complicating pregnancy, first trimester: Secondary | ICD-10-CM | POA: Diagnosis not present

## 2019-12-04 DIAGNOSIS — O210 Mild hyperemesis gravidarum: Secondary | ICD-10-CM | POA: Diagnosis not present

## 2019-12-04 DIAGNOSIS — E876 Hypokalemia: Secondary | ICD-10-CM | POA: Diagnosis not present

## 2019-12-04 DIAGNOSIS — O219 Vomiting of pregnancy, unspecified: Secondary | ICD-10-CM | POA: Diagnosis present

## 2019-12-04 DIAGNOSIS — Z87891 Personal history of nicotine dependence: Secondary | ICD-10-CM | POA: Diagnosis not present

## 2019-12-04 DIAGNOSIS — O30041 Twin pregnancy, dichorionic/diamniotic, first trimester: Secondary | ICD-10-CM | POA: Diagnosis not present

## 2019-12-04 DIAGNOSIS — R112 Nausea with vomiting, unspecified: Secondary | ICD-10-CM

## 2019-12-04 LAB — CBC WITH DIFFERENTIAL/PLATELET
Abs Immature Granulocytes: 0.02 10*3/uL (ref 0.00–0.07)
Basophils Absolute: 0 10*3/uL (ref 0.0–0.1)
Basophils Relative: 0 %
Eosinophils Absolute: 0 10*3/uL (ref 0.0–0.5)
Eosinophils Relative: 0 %
HCT: 35.8 % — ABNORMAL LOW (ref 36.0–46.0)
Hemoglobin: 12.4 g/dL (ref 12.0–15.0)
Immature Granulocytes: 0 %
Lymphocytes Relative: 19 %
Lymphs Abs: 1.8 10*3/uL (ref 0.7–4.0)
MCH: 28.4 pg (ref 26.0–34.0)
MCHC: 34.6 g/dL (ref 30.0–36.0)
MCV: 81.9 fL (ref 80.0–100.0)
Monocytes Absolute: 0.6 10*3/uL (ref 0.1–1.0)
Monocytes Relative: 6 %
Neutro Abs: 7 10*3/uL (ref 1.7–7.7)
Neutrophils Relative %: 75 %
Platelets: 282 10*3/uL (ref 150–400)
RBC: 4.37 MIL/uL (ref 3.87–5.11)
RDW: 14.6 % (ref 11.5–15.5)
WBC: 9.4 10*3/uL (ref 4.0–10.5)
nRBC: 0 % (ref 0.0–0.2)

## 2019-12-04 LAB — COMPREHENSIVE METABOLIC PANEL
ALT: 12 U/L (ref 0–44)
AST: 11 U/L — ABNORMAL LOW (ref 15–41)
Albumin: 3.7 g/dL (ref 3.5–5.0)
Alkaline Phosphatase: 53 U/L (ref 38–126)
Anion gap: 7 (ref 5–15)
BUN: 6 mg/dL (ref 6–20)
CO2: 23 mmol/L (ref 22–32)
Calcium: 8.9 mg/dL (ref 8.9–10.3)
Chloride: 102 mmol/L (ref 98–111)
Creatinine, Ser: 0.69 mg/dL (ref 0.44–1.00)
GFR, Estimated: >60 mL/min (ref >60)
Glucose, Bld: 104 mg/dL — ABNORMAL HIGH (ref 70–99)
Potassium: 3.1 mmol/L — ABNORMAL LOW (ref 3.5–5.1)
Sodium: 132 mmol/L — ABNORMAL LOW (ref 135–145)
Total Bilirubin: 0.3 mg/dL (ref 0.3–1.2)
Total Protein: 7.6 g/dL (ref 6.5–8.1)

## 2019-12-04 LAB — URINALYSIS, ROUTINE W REFLEX MICROSCOPIC
Bilirubin Urine: NEGATIVE
Glucose, UA: NEGATIVE mg/dL
Hgb urine dipstick: NEGATIVE
Ketones, ur: NEGATIVE mg/dL
Leukocytes,Ua: NEGATIVE
Nitrite: NEGATIVE
Protein, ur: NEGATIVE mg/dL
Specific Gravity, Urine: 1.013 (ref 1.005–1.030)
pH: 5 (ref 5.0–8.0)

## 2019-12-04 LAB — MAGNESIUM: Magnesium: 2 mg/dL (ref 1.7–2.4)

## 2019-12-04 LAB — LIPASE, BLOOD: Lipase: 17 U/L (ref 11–51)

## 2019-12-04 MED ORDER — LACTATED RINGERS IV BOLUS
1000.0000 mL | Freq: Once | INTRAVENOUS | Status: AC
  Administered 2019-12-04: 1000 mL via INTRAVENOUS

## 2019-12-04 MED ORDER — PROMETHAZINE HCL 25 MG/ML IJ SOLN
12.5000 mg | Freq: Once | INTRAMUSCULAR | Status: AC
  Administered 2019-12-04: 12.5 mg via INTRAVENOUS
  Filled 2019-12-04: qty 1

## 2019-12-04 MED ORDER — POTASSIUM CHLORIDE CRYS ER 20 MEQ PO TBCR: 20.0000 meq | EXTENDED_RELEASE_TABLET | Freq: Every day | ORAL | 0 refills | Status: DC

## 2019-12-04 MED ORDER — POTASSIUM CHLORIDE CRYS ER 20 MEQ PO TBCR
40.0000 meq | EXTENDED_RELEASE_TABLET | Freq: Once | ORAL | Status: AC
  Administered 2019-12-04: 40 meq via ORAL
  Filled 2019-12-04: qty 2

## 2019-12-04 NOTE — ED Triage Notes (Signed)
Pt to er, pt states that she is about [redacted] weeks pregnant with twins, [facility].  States that she has tried soups and medication, but it doesn't seem to help, states that she is here today for the same.

## 2019-12-04 NOTE — ED Provider Notes (Signed)
Emergency Department Provider Note   I have reviewed the triage vital signs and the nursing notes.   HISTORY  Chief Complaint Diarrhea and Vomiting   HPI Misty Murillo is a 29 y.o. female G3P1011 at 10w with di/di twins by [place] presents to the emergency department with continued vomiting and some new onset diarrhea.  Patient is having greater than 6 episodes of vomiting per 24-hour period. She was seen at the [facility] yesterday and ultimately was feeling better after treatment.  She was discharged home with plan to take Phenergan along with Reglan and reserve Zofran for breakthrough symptoms.  She has been doing this at home but did not ultimately take the Zofran.  She denies abdominal pain except when she is actively vomiting.  She has had 1 or 2 episodes of small-volume diarrhea which is nonbloody since being seen in the [facility].  She is not having vaginal bleeding, discharge, fluid.  She notes history of hyperemesis with her first pregnancy that did require hospitalization at some point.  She is feeling increasingly fatigued and generally weak and so presented to the emergency department for reevaluation. Denies fever. No URI symptoms.    Past Medical History:  Diagnosis Date  . Anxiety   . Depression   . Graves disease   . Graves disease   . Headache   . Liver mass   . Migraines     Patient Active Problem List   Diagnosis Date Noted  . Hyperthyroidism 03/08/2018  . Vitamin D deficiency 03/08/2018  . Sickle cell trait ([place]) 05/26/2017  . Liver mass, left lobe 05/18/2017    Past Surgical History:  Procedure Laterality Date  . CHOLECYSTECTOMY    . RESECTION LIVER      Allergies Patient has no known allergies.  Family History  Problem Relation Age of Onset  . Diabetes Mother   . Hyperthyroidism Mother   . Migraines Father   . Cancer Other   . Hyperthyroidism Brother     Social History Social History   Tobacco Use  . Smoking status: Former Smoker    Packs/day: 0.00     Types: Cigars    Quit date: 01/27/2018    Years since quitting: 1.8  . Smokeless tobacco: Never Used  Substance Use Topics  . Alcohol use: No  . Drug use: Not Currently    Types: Marijuana    Review of Systems  Constitutional: No fever/chills. Positive generalized weakness.  Eyes: No visual changes. ENT: No sore throat. Cardiovascular: Denies chest pain. Respiratory: Denies shortness of breath. Gastrointestinal: No abdominal pain.  Positive nausea, vomiting, and diarrhea.  No constipation. Genitourinary: Negative for dysuria. Musculoskeletal: Negative for back pain. Skin: Negative for rash. Neurological: Negative for headaches, focal weakness or numbness.  10-point ROS otherwise negative.  ____________________________________________   PHYSICAL EXAM:  VITAL SIGNS: ED Triage Vitals  Enc Vitals Group     BP 12/04/19 1554 (!) 142/87     Pulse Rate 12/04/19 1554 78     Resp 12/04/19 1554 18     Temp 12/04/19 1554 98.3 F (36.8 C)     Temp Source 12/04/19 1554 Oral     SpO2 12/04/19 1554 99 %     Weight 12/04/19 1555 230 lb (104.3 kg)     Height 12/04/19 1555 5\' 3"  (1.6 m)   Constitutional: Alert and oriented. Well appearing and in no acute distress. Eyes: Conjunctivae are normal.  Head: Atraumatic. Nose: No congestion/rhinnorhea. Mouth/Throat: Mucous membranes are slightly dry.  Neck: No stridor.   Cardiovascular: Normal rate, regular rhythm. Good peripheral circulation. Grossly normal heart sounds.   Respiratory: Normal respiratory effort.  No retractions. Lungs CTAB. Gastrointestinal: Soft and nontender. No distention.  Musculoskeletal: No lower extremity tenderness nor edema. No gross deformities of extremities. Neurologic:  Normal speech and language. No gross focal neurologic deficits are appreciated.  Skin:  Skin is warm, dry and intact. No rash noted.   ____________________________________________   LABS (all labs ordered are listed, but only abnormal  results are displayed)  Labs Reviewed  URINE CULTURE - Abnormal; Notable for the following components:      Result Value   Culture   (*)    Value: <10,000 COLONIES/mL INSIGNIFICANT GROWTH Performed at [facility], [place] [address] [place], [place] [address]    All other components within normal limits  COMPREHENSIVE METABOLIC PANEL - Abnormal; Notable for the following components:   Sodium 132 (*)    Potassium 3.1 (*)    Glucose, Bld 104 (*)    AST 11 (*)    All other components within normal limits  CBC WITH DIFFERENTIAL/PLATELET - Abnormal; Notable for the following components:   HCT 35.8 (*)    All other components within normal limits  LIPASE, BLOOD  URINALYSIS, ROUTINE W REFLEX MICROSCOPIC  MAGNESIUM   ____________________________________________  EKG   EKG Interpretation  Date/Time:  Sunday December 04 2019 17:29:52 EST Ventricular Rate:  75 PR Interval:    QRS Duration: 84 QT Interval:  381 QTC Calculation: 426 R Axis:   50 Text Interpretation: Normal sinus rhythm No STEMI Confirmed by Nanda Quinton [phone]) on 12/04/2019 5:36:48 PM       ____________________________________________  RADIOLOGY  None ____________________________________________   PROCEDURES  Procedure(s) performed:   Procedures  None ____________________________________________   INITIAL IMPRESSION / ASSESSMENT AND PLAN / ED COURSE  Pertinent labs & imaging results that were available during my care of the patient were reviewed by me and considered in my medical decision making (see chart for details).   Patient presents emergency department with nausea and vomiting in pregnancy.  She was seen at the [facility] yesterday but returns today with continued symptoms despite multiple nausea medications at home.  Her QTC here is 426.  Gave IV Phenergan along with LR and obtain basic lab work showing mild hypokalemia.  No acute kidney injury.  Plan to reassess after fluids.   Labs reviewed  and reassuring. Mild hypokalemia. After IVF and meds in the ED with patient is feeling better. She is tolerating fluids and Kdur. Will supplement K. No significant QT abnormalities on EKG. Patient feeling well enough to go home and continue OB treatment plan. Has multiple nausea meds at home along with strategy to deal with symptoms. Encouraged close OB f/u along with strict ED return precautions. Patient comfortable with the plan at discharge.  ____________________________________________  FINAL CLINICAL IMPRESSION(S) / ED DIAGNOSES  Final diagnoses:  Non-intractable vomiting with nausea, unspecified vomiting type  Hypokalemia     MEDICATIONS GIVEN DURING THIS VISIT:  Medications  lactated ringers bolus 1,000 mL (0 mLs Intravenous Stopped 12/04/19 1835)  promethazine (PHENERGAN) injection 12.5 mg (12.5 mg Intravenous Given 12/04/19 1729)  potassium chloride SA (KLOR-CON) CR tablet 40 mEq (40 mEq Oral Given 12/04/19 1733)     NEW OUTPATIENT MEDICATIONS STARTED DURING THIS VISIT:  Discharge Medication List as of 12/04/2019  7:15 PM    START taking these medications   Details  potassium chloride SA (KLOR-CON) 20  MEQ tablet Take 1 tablet (20 mEq total) by mouth daily for 3 days., Starting Mon 12/05/2019, Until Thu 12/08/2019, Normal        Note:  This document was prepared using Dragon voice recognition software and may include unintentional dictation errors.  Nanda Quinton, MD, [facility] Emergency Medicine    Kaenan Jake, W[facility], MD 12/08/19 1025

## 2019-12-04 NOTE — Discharge Instructions (Signed)
You were seen in the emergency department today with vomiting during pregnancy.  We have given IV fluids and I am discharging you home with a prescription for some potassium to take over the next several days.  Please follow closely with your primary care doctor as well as your OB/GYN.  Return to the emergency department any new or suddenly worsening symptoms such as severe abdominal pain, fever, vaginal bleeding, or other sudden worsening symptoms.

## 2019-12-04 NOTE — ED Notes (Signed)
PO water given. Tolerating well so fat. Pts family at bedside

## 2019-12-06 LAB — URINE CULTURE: Culture: <10000 — AB

## 2019-12-12 ENCOUNTER — Other Ambulatory Visit: Payer: Self-pay

## 2019-12-12 ENCOUNTER — Telehealth (INDEPENDENT_AMBULATORY_CARE_PROVIDER_SITE_OTHER): Payer: BC Managed Care – PPO | Admitting: *Deleted

## 2019-12-12 DIAGNOSIS — E0500 Thyrotoxicosis with diffuse goiter without thyrotoxic crisis or storm: Secondary | ICD-10-CM

## 2019-12-12 DIAGNOSIS — N730 Acute parametritis and pelvic cellulitis: Secondary | ICD-10-CM

## 2019-12-12 DIAGNOSIS — Z3A00 Weeks of gestation of pregnancy not specified: Secondary | ICD-10-CM

## 2019-12-12 DIAGNOSIS — O99210 Obesity complicating pregnancy, unspecified trimester: Secondary | ICD-10-CM

## 2019-12-12 DIAGNOSIS — O0990 Supervision of high risk pregnancy, unspecified, unspecified trimester: Secondary | ICD-10-CM

## 2019-12-12 DIAGNOSIS — O30009 Twin pregnancy, unspecified number of placenta and unspecified number of amniotic sacs, unspecified trimester: Secondary | ICD-10-CM

## 2019-12-12 DIAGNOSIS — E0590 Thyrotoxicosis, unspecified without thyrotoxic crisis or storm: Secondary | ICD-10-CM | POA: Insufficient documentation

## 2019-12-12 DIAGNOSIS — D219 Benign neoplasm of connective and other soft tissue, unspecified: Secondary | ICD-10-CM

## 2019-12-12 DIAGNOSIS — O99280 Endocrine, nutritional and metabolic diseases complicating pregnancy, unspecified trimester: Secondary | ICD-10-CM

## 2019-12-12 DIAGNOSIS — O30099 Twin pregnancy, unable to determine number of placenta and number of amniotic sacs, unspecified trimester: Secondary | ICD-10-CM

## 2019-12-12 NOTE — Progress Notes (Signed)
10:19 patient not connected virtually. I called and explained how to join me virtually. Nikalas Bramel,RN  New OB Intake  I connected with  [name] on 12/12/19 at 10:29 by MyChart and verified that I am speaking with the correct person using two identifiers. Nurse is located at [facility] and pt is located at home.  I discussed the limitations, risks, security and privacy concerns of performing an evaluation and management service by telephone and the availability of in person appointments. I also discussed with the patient that there may be a patient responsible charge related to this service. The patient expressed understanding and agreed to proceed.  I explained I am completing New OB Intake today. We discussed her EDD of 07/01/20 that was determined by [facility]; she is transferring care from there. They drew her initial blood work and did an [place]. I have asked to sign a release for records asap so we can get records before her first ob visit .  Pt is G3/P1011. I reviewed her allergies, medications, Medical/Surgical/OB history, and appropriate screenings. I informed her of [facility] services. Based on history, this is a/an complicated by twin pregnancy, Hx PID, HX Graves Disease. pregnancy.  Concerns addressed today  MyChart/Babyscripts MyChart access verified. I explained pt will have some visits in office and some virtually. Babyscripts instructions given.   Blood Pressure Cuff We discussed Evelen currently has BCBS but she states this is her last week of working and she will lose her insurance in about a month. She has medicaid which will become her primary then. I explained she can buy a bp cuff now or if she is not able to; we can order once her medicaid is her primary. I  Explained after she gets her bp cuff we would like for her to  check  Her bp weekly and document in Babyscripts.  [place] Patient interested in referral to [facility], will send referral.  COVID vaccine Patient has not had covid  vaccine.   Anatomy US Explained first scheduled [place] will be around 19 weeks. Explained I will schedule her [place].   Labs Discussed Johnsie Cancel genetic screening with patient. Would like both Panorama and Horizon drawn at new OB visit. Will draw other labs as determined once we receive her records form [facility].   First visit review I reviewed new OB appt with pt. I explained we will continue with her care from where other office started.  Explained pt will be seen by Dr.Ervin  at first visit; encounter routed to appropriate provider.  Trevell Pariseau,RN 12/12/2019  10:29 AM

## 2019-12-12 NOTE — Patient Instructions (Signed)
  At [facility], we work as an integrated team, providing care to address both physical and emotional health. Your medical provider may refer you to see our [facility]) on the same day you see your medical provider, as availability permits.  Our [facility] is available to all patients, visits generally last between 20-30 minutes, but can be longer or shorter, depending on patient need. The [facility] offers help with stress management, coping with symptoms of depression and anxiety, major life changes , sleep issues, changing risky behavior, grief and loss, life stress, working on personal life goals, and  behavioral health issues, as these all affect your overall health and wellness.  The [facility] is NOT available for the following: FMLA paperwork, court-ordered evaluations, specialty assessments (custody or disability), letters to employers, or obtaining certification for an emotional support animal. The [facility] does not provide long-term therapy. You have the right to refuse integrated behavioral health services, or to reschedule to see the [facility] at a later date.  Exception: If you are having thoughts of suicide, we require that you either see the [facility] for further assessment, or contract for safety with your medical provider. Confidentiality exception: If it is suspected that a child or disabled adult is being abused or neglected, we are required by law to report that to either Child Protective Services or Adult Scientist, forensic.  If you have a diagnosis of Bipolar affective disorder, Schizophrenia, or recurrent Major depressive disorder, we will recommend that you establish care with a psychiatrist, as these are lifelong, chronic conditions, and we want your overall emotional health and medications to be more closely monitored. If you anticipate needing extended maternity leave due to mental illness, it it recommended you inform your medical provider, so  we can put in a referral to a  psychiatrist as soon as possible. The [facility] is unable to recommend an extended maternity leave for mental health issues. Your medical provider or [facility] may refer you to a therapist for ongoing, traditional therapy, or to a psychiatrist, for medication management, if it would benefit your overall health. Depending on your insurance, you may have a copay to see the [facility]. If you are uninsured, it is recommended that you apply for financial assistance. (Forms may be requested at the front desk for in-person visits, via MyChart, or request a form during a virtual visit).  If you see the [facility] more than 6 times, you will have to complete a comprehensive clinical assessment interview with the [facility] to resume integrated services.  For virtual visits with the [facility], you must be physically in the state of [place] at the time of the visit. For example, if you live in [place], you will have to do an in-person visit with the [facility]. If you are going out of the state or country for any reason, the [facility] may see you virtually when you return to [place], but not while you are physically outside of Home.

## 2019-12-19 ENCOUNTER — Ambulatory Visit (INDEPENDENT_AMBULATORY_CARE_PROVIDER_SITE_OTHER): Payer: BC Managed Care – PPO | Admitting: Obstetrics and Gynecology

## 2019-12-19 ENCOUNTER — Other Ambulatory Visit: Payer: Self-pay

## 2019-12-19 ENCOUNTER — Other Ambulatory Visit: Payer: Self-pay | Admitting: Obstetrics and Gynecology

## 2019-12-19 ENCOUNTER — Other Ambulatory Visit (HOSPITAL_COMMUNITY)
Admission: RE | Admit: 2019-12-19 | Discharge: 2019-12-19 | Disposition: A | Discharge status: Home / Self Care | Payer: BC Managed Care – PPO | Source: Ambulatory Visit | Attending: Obstetrics and Gynecology | Admitting: Obstetrics and Gynecology

## 2019-12-19 ENCOUNTER — Encounter: Payer: Self-pay | Admitting: Obstetrics and Gynecology

## 2019-12-19 VITALS — BP 109/72 | HR 85 | Wt 239.5 lb

## 2019-12-19 DIAGNOSIS — E0500 Thyrotoxicosis with diffuse goiter without thyrotoxic crisis or storm: Secondary | ICD-10-CM

## 2019-12-19 DIAGNOSIS — O30041 Twin pregnancy, dichorionic/diamniotic, first trimester: Secondary | ICD-10-CM

## 2019-12-19 DIAGNOSIS — E0590 Thyrotoxicosis, unspecified without thyrotoxic crisis or storm: Secondary | ICD-10-CM

## 2019-12-19 DIAGNOSIS — D573 Sickle-cell trait: Secondary | ICD-10-CM

## 2019-12-19 DIAGNOSIS — O0990 Supervision of high risk pregnancy, unspecified, unspecified trimester: Secondary | ICD-10-CM | POA: Insufficient documentation

## 2019-12-19 MED ORDER — BLOOD PRESSURE MONITORING DEVI: 1.0000 | 0 refills | Status: DC

## 2019-12-19 MED ORDER — ASPIRIN EC 81 MG PO TBEC: 81.0000 mg | DELAYED_RELEASE_TABLET | Freq: Every day | ORAL | 2 refills | Status: DC

## 2019-12-19 NOTE — Progress Notes (Signed)
Subjective:  Misty Murillo is a 29 y.o. G3P1011 at [redacted]w[redacted]d being seen today for her first OB visit. Di/Di twins by U/S. EDD by first trimester U/S. H/O TSVD without problems. Medical problems as listed. Followed by Endocrine for Graves Dz, no meds x 6 months.  She is currently monitored for the following issues for this high-risk pregnancy and has Liver mass, left lobe; Sickle cell trait ([place]); Hyperthyroidism; Vitamin D deficiency; Supervision of high risk pregnancy, antepartum; Graves' disease; Chronic urticaria; Angioedema; Fibroid; Graves disease; Twin pregnancy; and Obesity in pregnancy on their problem list.  Patient reports no complaints.  Contractions: Not present. Vag. Bleeding: None.  Movement: Absent. Denies leaking of fluid.   The following portions of the patient's history were reviewed and updated as appropriate: allergies, current medications, past family history, past medical history, past social history, past surgical history and problem list. Problem list updated.  Objective:   Vitals:   12/19/19 1023  BP: 109/72  Pulse: 85  Weight: 239 lb 8 oz (108.6 kg)    Fetal Status: Fetal Heart Rate (bpm): 169   Movement: Absent     General:  Alert, oriented and cooperative. Patient is in no acute distress.  Skin: Skin is warm and dry. No rash noted.   Cardiovascular: Normal heart rate noted  Respiratory: Normal respiratory effort, no problems with respiration noted  Abdomen: Soft, gravid, appropriate for gestational age. Pain/Pressure: Present     Pelvic:  Cervical exam deferred        Extremities: Normal range of motion.  Edema: None  Mental Status: Normal mood and affect. Normal behavior. Normal judgment and thought content.   Urinalysis:      Assessment and Plan:  Pregnancy: G3P1011 at [redacted]w[redacted]d  1. Supervision of high risk pregnancy, antepartum Prenatal care and labs reviewed with pt. Genetic testing reviewed - CBC/D/Plt+RPR+Rh+ABO+Rub Ab... - CHL AMB BABYSCRIPTS SCHEDULE  OPTIMIZATION - Culture, OB Urine - Genetic Screening - Hemoglobin A1c - Cervicovaginal ancillary only( [place]) - Blood Pressure Monitoring DEVI; 1 each by Does not apply route once a week.  Dispense: 1 each; Refill: 0  2. Hyperthyroidism Followed by Endocrine No meds x 6 months - Thyroid Panel With TSH  3. Dichorionic diamniotic twin pregnancy in first trimester Twin gestation reviewed with pt. Potential risks discussed.  - aspirin EC 81 MG tablet; Take 1 tablet (81 mg total) by mouth daily. Take after 12 weeks for prevention of preeclampsia later in pregnancy  Dispense: 300 tablet; Refill: 2  4. Graves disease See above as well - Thyroid Panel With TSH  5. Sickle cell trait ([facility]) Urine Culture  Preterm labor symptoms and general obstetric precautions including but not limited to vaginal bleeding, contractions, leaking of fluid and fetal movement were reviewed in detail with the patient. Please refer to After Visit Summary for other counseling recommendations.  Return in about 4 weeks (around 01/16/2020) for OB visit, face to face, MD only.   Chancy Milroy, MD

## 2019-12-19 NOTE — Patient Instructions (Addendum)
First Trimester of Pregnancy The first trimester of pregnancy is from week 1 until the end of week 13 (months 1 through 3). A week after a sperm fertilizes an egg, the egg will implant on the wall of the uterus. This embryo will begin to develop into a baby. Genes from you and your partner will form the baby. The female genes will determine whether the baby will be a boy or a girl. At 6-8 weeks, the eyes and face will be formed, and the heartbeat can be seen on ultrasound. At the end of 12 weeks, all the baby's organs will be formed. Now that you are pregnant, you will want to do everything you can to have a healthy baby. Two of the most important things are to get good prenatal care and to follow your health care provider's instructions. Prenatal care is all the medical care you receive before the baby's birth. This care will help prevent, find, and treat any problems during the pregnancy and childbirth. Body changes during your first trimester Your body goes through many changes during pregnancy. The changes vary from woman to woman.  You may gain or lose a couple of pounds at first.  You may feel sick to your stomach (nauseous) and you may throw up (vomit). If the vomiting is uncontrollable, call your health care provider.  You may tire easily.  You may develop headaches that can be relieved by medicines. All medicines should be approved by your health care provider.  You may urinate more often. Painful urination may mean you have a bladder infection.  You may develop heartburn as a result of your pregnancy.  You may develop constipation because certain hormones are causing the muscles that push stool through your intestines to slow down.  You may develop hemorrhoids or swollen veins (varicose veins).  Your breasts may begin to grow larger and become tender. Your nipples may stick out more, and the tissue that surrounds them (areola) may become darker.  Your gums may bleed and may be  sensitive to brushing and flossing.  Dark spots or blotches (chloasma, mask of pregnancy) may develop on your face. This will likely fade after the baby is born.  Your menstrual periods will stop.  You may have a loss of appetite.  You may develop cravings for certain kinds of food.  You may have changes in your emotions from day to day, such as being excited to be pregnant or being concerned that something may go wrong with the pregnancy and baby.  You may have more vivid and strange dreams.  You may have changes in your hair. These can include thickening of your hair, rapid growth, and changes in texture. Some women also have hair loss during or after pregnancy, or hair that feels dry or thin. Your hair will most likely return to normal after your baby is born. What to expect at prenatal visits During a routine prenatal visit:  You will be weighed to make sure you and the baby are growing normally.  Your blood pressure will be taken.  Your abdomen will be measured to track your baby's growth.  The fetal heartbeat will be listened to between weeks 10 and 14 of your pregnancy.  Test results from any previous visits will be discussed. Your health care provider may ask you:  How you are feeling.  If you are feeling the baby move.  If you have had any abnormal symptoms, such as leaking fluid, bleeding, severe headaches, or abdominal   cramping.  If you are using any tobacco products, including cigarettes, chewing tobacco, and electronic cigarettes.  If you have any questions. Other tests that may be performed during your first trimester include:  Blood tests to find your blood type and to check for the presence of any previous infections. The tests will also be used to check for low iron levels (anemia) and protein on red blood cells (Rh antibodies). Depending on your risk factors, or if you previously had diabetes during pregnancy, you may have tests to check for high blood sugar  that affects pregnant women (gestational diabetes).  Urine tests to check for infections, diabetes, or protein in the urine.  An ultrasound to confirm the proper growth and development of the baby.  Fetal screens for spinal cord problems (spina bifida) and Down syndrome.  HIV (human immunodeficiency virus) testing. Routine prenatal testing includes screening for HIV, unless you choose not to have this test.  You may need other tests to make sure you and the baby are doing well. Follow these instructions at home: Medicines  Follow your health care provider's instructions regarding medicine use. Specific medicines may be either safe or unsafe to take during pregnancy.  Take a prenatal vitamin that contains at least 600 micrograms (mcg) of folic acid.  If you develop constipation, try taking a stool softener if your health care provider approves. Eating and drinking   Eat a balanced diet that includes fresh fruits and vegetables, whole grains, good sources of protein such as meat, eggs, or tofu, and low-fat dairy. Your health care provider will help you determine the amount of weight gain that is right for you.  Avoid raw meat and uncooked cheese. These carry germs that can cause birth defects in the baby.  Eating four or five small meals rather than three large meals a day may help relieve nausea and vomiting. If you start to feel nauseous, eating a few soda crackers can be helpful. Drinking liquids between meals, instead of during meals, also seems to help ease nausea and vomiting.  Limit foods that are high in fat and processed sugars, such as fried and sweet foods.  To prevent constipation: ? Eat foods that are high in fiber, such as fresh fruits and vegetables, whole grains, and beans. ? Drink enough fluid to keep your urine clear or pale yellow. Activity  Exercise only as directed by your health care provider. Most women can continue their usual exercise routine during  pregnancy. Try to exercise for 30 minutes at least 5 days a week. Exercising will help you: ? Control your weight. ? Stay in shape. ? Be prepared for labor and delivery.  Experiencing pain or cramping in the lower abdomen or lower back is a good sign that you should stop exercising. Check with your health care provider before continuing with normal exercises.  Try to avoid standing for long periods of time. Move your legs often if you must stand in one place for a long time.  Avoid heavy lifting.  Wear low-heeled shoes and practice good posture.  You may continue to have sex unless your health care provider tells you not to. Relieving pain and discomfort  Wear a good support bra to relieve breast tenderness.  Take warm sitz baths to soothe any pain or discomfort caused by hemorrhoids. Use hemorrhoid cream if your health care provider approves.  Rest with your legs elevated if you have leg cramps or low back pain.  If you develop varicose veins in   your legs, wear support hose. Elevate your feet for 15 minutes, 3-4 times a day. Limit salt in your diet. Prenatal care  Schedule your prenatal visits by the twelfth week of pregnancy. They are usually scheduled monthly at first, then more often in the last 2 months before delivery.  Write down your questions. Take them to your prenatal visits.  Keep all your prenatal visits as told by your health care provider. This is important. Safety  Wear your seat belt at all times when driving.  Make a list of emergency phone numbers, including numbers for family, friends, the hospital, and police and fire departments. General instructions  Ask your health care provider for a referral to a local prenatal education class. Begin classes no later than the beginning of month 6 of your pregnancy.  Ask for help if you have counseling or nutritional needs during pregnancy. Your health care provider can offer advice or refer you to specialists for help  with various needs.  Do not use hot tubs, steam rooms, or saunas.  Do not douche or use tampons or scented sanitary pads.  Do not cross your legs for long periods of time.  Avoid cat litter boxes and soil used by cats. These carry germs that can cause birth defects in the baby and possibly loss of the fetus by miscarriage or stillbirth.  Avoid all smoking, herbs, alcohol, and medicines not prescribed by your health care provider. Chemicals in these products affect the formation and growth of the baby.  Do not use any products that contain nicotine or tobacco, such as cigarettes and e-cigarettes. If you need help quitting, ask your health care provider. You may receive counseling support and other resources to help you quit.  Schedule a dentist appointment. At home, brush your teeth with a soft toothbrush and be gentle when you floss. Contact a health care provider if:  You have dizziness.  You have mild pelvic cramps, pelvic pressure, or nagging pain in the abdominal area.  You have persistent nausea, vomiting, or diarrhea.  You have a bad smelling vaginal discharge.  You have pain when you urinate.  You notice increased swelling in your face, hands, legs, or ankles.  You are exposed to fifth disease or chickenpox.  You are exposed to [place] measles (rubella) and have never had it. Get help right away if:  You have a fever.  You are leaking fluid from your vagina.  You have spotting or bleeding from your vagina.  You have severe abdominal cramping or pain.  You have rapid weight gain or loss.  You vomit blood or material that looks like coffee grounds.  You develop a severe headache.  You have shortness of breath.  You have any kind of trauma, such as from a fall or a car accident. Summary  The first trimester of pregnancy is from week 1 until the end of week 13 (months 1 through 3).  Your body goes through many changes during pregnancy. The changes vary from  woman to woman.  You will have routine prenatal visits. During those visits, your health care provider will examine you, discuss any test results you may have, and talk with you about how you are feeling. This information is not intended to replace advice given to you by your health care provider. Make sure you discuss any questions you have with your health care provider. Document Revised: 12/19/2016 Document Reviewed: 12/19/2015 Elsevier Patient Education  2020 [place]Twin and  Triplet Pregnancy. [place]: [employer] for [place]); 2019.">  Multiple Pregnancy Multiple pregnancy means that a woman is carrying more than one baby at a time. She may be pregnant with twins, triplets, or more. The majority of multiple pregnancies are twins. Naturally conceiving triplets or more (higher-order multiples) is rare. Multiple pregnancies are riskier than single pregnancies. A woman with a multiple pregnancy is more likely to have certain problems during her pregnancy. How does a multiple pregnancy happen? A multiple pregnancy happens when:  The woman's body releases more than one egg at a time, and then each egg gets fertilized by a different sperm. ? This is the most common type of multiple pregnancy. ? Twins or other multiples produced this way are called fraternal. They are no more alike than non-multiple siblings are.  One sperm fertilizes one egg, which then divides into more than one embryo. ? Twins or other multiples produced this way are called identical. Identical multiples are always the same gender, and they look very much alike. Who is most likely to have a multiple pregnancy? A multiple pregnancy is more likely to develop in women who:  Have had fertility treatment, especially if the treatment included fertility medicines.  Are older than 28 years of age.  Have already had four or more children.  Have a family  history of multiple pregnancy. How is a multiple pregnancy diagnosed? A multiple pregnancy may be diagnosed based on:  Symptoms such as: ? Rapid weight gain in the first 3 months of pregnancy (first trimester). ? More severe nausea and breast tenderness than what is typical of a single pregnancy. ? A larger uterus than what is normal for the stage of the pregnancy.  Blood tests that detect a higher-than-normal level of human chorionic gonadotropin (hCG). This is a hormone that your body produces in early pregnancy.  An ultrasound exam. This is used to confirm that you are carrying multiples. What risks come with multiple pregnancy? A multiple pregnancy puts you at a higher risk for certain problems during or after your pregnancy. These include:  Delivering your babies before your due date (preterm birth). A full-term pregnancy lasts for at least 37 weeks. ? Babies born before 33 weeks may have a higher risk for breathing problems, feeding difficulties, cerebral palsy, and learning disabilities.  Diabetes.  Preeclampsia. This is a serious condition that causes high blood pressure and headaches during pregnancy.  Too much blood loss after childbirth (postpartum hemorrhage).  Postpartum depression.  Low birth weight of the babies. How will having a multiple pregnancy affect my care? Your health care team will monitor you more closely. You may need more frequent prenatal visits. This will ensure that you are healthy and that your babies are growing normally. Follow these instructions at home: Eating and drinking  Increase your nutrition. ? Follow your health care provider's recommendations for weight gain. You may need to gain a little extra weight when you are pregnant with multiples. ? Eat healthy snacks often throughout the day. This will add calories and reduce nausea.  Drink enough fluid to keep your urine pale yellow.  Take prenatal vitamins. Ask your health care provider what  vitamins are right for you. Activity Limit your activities by 20-24 weeks of pregnancy.  Rest often.  Avoid activities, exercise, and work that take a lot of effort.  Ask your health care provider when you should stop having sex. General instructions  Do not use any products that contain nicotine  or tobacco, such as cigarettes, e-cigarettes, and chewing tobacco. If you need help quitting, ask your health care provider.  Do not drink alcohol or use illegal drugs.  Take over-the-counter and prescription medicines only as told by your health care provider.  Arrange for extra help around the house.  Keep all follow-up visits and all prenatal visits as told by your health care provider. This is important. Where to find more information  [employer]: www.acog.org Contact a health care provider if:  You have dizziness.  You have nausea, vomiting, or diarrhea that does not go away.  You have depression or other emotions that are interfering with your normal activities.  You have a fever.  You have pain with urination.  You have a bad-smelling vaginal discharge.  You notice increased swelling in your face, hands, legs, or ankles. Get help right away if:  You have fluid leaking from your vagina.  You have bleeding from your vagina.  You have pelvic cramps, pelvic pressure, or nagging pain in your abdomen or lower back.  You are having regular contractions.  You have a severe headache, with or without changes in how you see.  You have chest pain or shortness of breath.  You notice that your babies move less often, or do not move at all. Summary  Having a multiple pregnancy means that a woman is carrying more than one baby at a time.  A multiple pregnancy puts you at a higher risk for delivering your babies before your due date, having diabetes, preeclampsia, too much blood loss after childbirth, or low birth weight of the  babies.  Your health care provider will monitor you more closely during your pregnancy.  You may need to make some lifestyle changes during pregnancy. This includes eating more, limiting your activities after 20-24 weeks of pregnancy, and arranging for extra help around the house.  Follow up with your health care provider as instructed if you experience any complications. This information is not intended to replace advice given to you by your health care provider. Make sure you discuss any questions you have with your health care provider. Document Revised: 08/30/2018 Document Reviewed: 08/30/2018 Elsevier Patient Education  [place].

## 2019-12-20 LAB — CBC/D/PLT+RPR+RH+ABO+RUB AB...
Antibody Screen: NEGATIVE
Basophils Absolute: 0 10*3/uL (ref 0.0–0.2)
Basos: 0 %
EOS (ABSOLUTE): 0 10*3/uL (ref 0.0–0.4)
Eos: 0 %
HCV Ab: <0.1 s/co ratio (ref 0.0–0.9)
HIV Screen 4th Generation wRfx: NONREACTIVE
Hematocrit: 33.6 % — ABNORMAL LOW (ref 34.0–46.6)
Hemoglobin: 11.9 g/dL (ref 11.1–15.9)
Hepatitis B Surface Ag: NEGATIVE
Immature Grans (Abs): 0 10*3/uL (ref 0.0–0.1)
Immature Granulocytes: 0 %
Lymphocytes Absolute: 1.4 10*3/uL (ref 0.7–3.1)
Lymphs: 16 %
MCH: 29 pg (ref 26.6–33.0)
MCHC: 35.4 g/dL (ref 31.5–35.7)
MCV: 82 fL (ref 79–97)
Monocytes Absolute: 0.4 10*3/uL (ref 0.1–0.9)
Monocytes: 5 %
Neutrophils Absolute: 6.8 10*3/uL (ref 1.4–7.0)
Neutrophils: 79 %
Platelets: 294 10*3/uL (ref 150–450)
RBC: 4.11 x10E6/uL (ref 3.77–5.28)
RDW: 15 % (ref 11.7–15.4)
RPR Ser Ql: NONREACTIVE
Rh Factor: POSITIVE
Rubella Antibodies, IGG: 3.22 index (ref >0.99)
WBC: 8.6 10*3/uL (ref 3.4–10.8)

## 2019-12-20 LAB — HEMOGLOBIN A1C
Est. average glucose Bld gHb Est-mCnc: 97 mg/dL
Hgb A1c MFr Bld: 5 % (ref 4.8–5.6)

## 2019-12-20 LAB — CERVICOVAGINAL ANCILLARY ONLY
Chlamydia: NEGATIVE
Comment: NEGATIVE
Comment: NORMAL
Neisseria Gonorrhea: NEGATIVE

## 2019-12-20 LAB — THYROID PANEL WITH TSH
Free Thyroxine Index: 2.4 (ref 1.2–4.9)
T3 Uptake Ratio: 15 % — ABNORMAL LOW (ref 24–39)
T4, Total: 15.9 ug/dL — ABNORMAL HIGH (ref 4.5–12.0)
TSH: 0.248 u[IU]/mL — ABNORMAL LOW (ref 0.450–4.500)

## 2019-12-20 LAB — HCV INTERPRETATION

## 2019-12-21 LAB — CULTURE, OB URINE

## 2019-12-21 LAB — URINE CULTURE, OB REFLEX

## 2019-12-23 ENCOUNTER — Telehealth: Payer: Self-pay

## 2019-12-23 ENCOUNTER — Other Ambulatory Visit: Payer: Self-pay

## 2019-12-23 DIAGNOSIS — K219 Gastro-esophageal reflux disease without esophagitis: Secondary | ICD-10-CM

## 2019-12-23 MED ORDER — FAMOTIDINE 20 MG PO TABS: 20.0000 mg | ORAL_TABLET | Freq: Two times a day (BID) | ORAL | 3 refills | Status: DC

## 2019-12-23 NOTE — Telephone Encounter (Signed)
Called Pt regarding Migraines & acid reflux issues. Advised pt to take 2 extra strength Tylenol every 6 hrs or 2 regular strength every 4 hrs. Advised once she gets her acid reflux under control should help with her appetite. Also advised will req for her to be scheduled to see dietician. Pt verbalized understanding.

## 2019-12-23 NOTE — Telephone Encounter (Signed)
Pt called Nurse line today at 8:42am stating that she has been having Migraines & real bad Acid Reflux with vomiting. Also has been keeping her up at night & not eating right.Pt request call back.

## 2019-12-26 ENCOUNTER — Encounter: Payer: Self-pay | Admitting: *Deleted

## 2020-01-10 ENCOUNTER — Other Ambulatory Visit: Payer: BC Managed Care – PPO

## 2020-01-16 ENCOUNTER — Other Ambulatory Visit: Payer: Self-pay

## 2020-01-16 ENCOUNTER — Telehealth: Payer: Self-pay | Admitting: Lactation Services

## 2020-01-16 ENCOUNTER — Ambulatory Visit (INDEPENDENT_AMBULATORY_CARE_PROVIDER_SITE_OTHER): Payer: Medicaid Other | Admitting: Obstetrics & Gynecology

## 2020-01-16 VITALS — BP 123/83 | HR 89 | Wt 234.6 lb

## 2020-01-16 DIAGNOSIS — E876 Hypokalemia: Secondary | ICD-10-CM | POA: Diagnosis not present

## 2020-01-16 DIAGNOSIS — O30042 Twin pregnancy, dichorionic/diamniotic, second trimester: Secondary | ICD-10-CM | POA: Diagnosis not present

## 2020-01-16 DIAGNOSIS — Z3A16 16 weeks gestation of pregnancy: Secondary | ICD-10-CM

## 2020-01-16 DIAGNOSIS — O0990 Supervision of high risk pregnancy, unspecified, unspecified trimester: Secondary | ICD-10-CM

## 2020-01-16 DIAGNOSIS — E0590 Thyrotoxicosis, unspecified without thyrotoxic crisis or storm: Secondary | ICD-10-CM

## 2020-01-16 DIAGNOSIS — O99280 Endocrine, nutritional and metabolic diseases complicating pregnancy, unspecified trimester: Secondary | ICD-10-CM

## 2020-01-16 NOTE — Telephone Encounter (Signed)
Called patient with Production manager. Patient is carrier for Sickle Cell Disease.   Patient did not answer. Mailbox full and not able to leave message.   Patient will need to be informed of results, reccmendation for [employer] and Partner Testing.   Will send My Chart message.

## 2020-01-16 NOTE — Patient Instructions (Signed)
Return to office for any scheduled appointments. Call the office or go to the [facility] at [facility] if:  You begin to have strong, frequent contractions  Your water breaks.  Sometimes it is a big gush of fluid, sometimes it is just a trickle that keeps getting your panties wet or running down your legs  You have vaginal bleeding.  It is normal to have a small amount of spotting if your cervix was checked.   Any other obstetric concerns.   

## 2020-01-16 NOTE — Progress Notes (Signed)
   PRENATAL VISIT NOTE  Subjective:  Misty Murillo is a 29 y.o. G3P1011 at [redacted]w[redacted]d being seen today for ongoing prenatal care.  She is currently monitored for the following issues for this high-risk pregnancy and has Liver mass, left lobe; Sickle cell trait ([place]); Hyperthyroidism; Vitamin D deficiency; Supervision of high risk pregnancy, antepartum; Graves' disease; Chronic urticaria; Angioedema; Fibroid; Hyperthyroidism in pregnancy, antepartum; Twin pregnancy; and Obesity in pregnancy on their problem list.  Patient reports muscle crmaping all over body, worried about low potassium level. Was treated for hypokalemia last month.  Had hyperemesis, much better now.  Also, has not seen her endocrinologist yet for management of her hyperthyroidism, recent T4 was elevated at 16 (12/19/19).  Contractions: Not present. Vag. Bleeding: None.  Movement: Absent. Denies leaking of fluid.   The following portions of the patient's history were reviewed and updated as appropriate: allergies, current medications, past family history, past medical history, past social history, past surgical history and problem list.   Objective:   Vitals:   01/16/20 1124  BP: 123/83  Pulse: 89  Weight: 234 lb 9.6 oz (106.4 kg)    Fetal Status: Fetal Heart Rate (bpm): + x 2 on u/s   Movement: Absent     General:  Alert, oriented and cooperative. Patient is in no acute distress.  Skin: Skin is warm and dry. No rash noted.   Cardiovascular: Normal heart rate noted  Respiratory: Normal respiratory effort, no problems with respiration noted  Abdomen: Soft, gravid, appropriate for gestational age.  Pain/Pressure: Present     Pelvic: Cervical exam deferred        Extremities: Normal range of motion.  Edema: None  Mental Status: Normal mood and affect. Normal behavior. Normal judgment and thought content.   Assessment and Plan:  Pregnancy: G3P1011 at [redacted]w[redacted]d 1. Dichorionic diamniotic twin pregnancy in second trimester Low risk  NIPS x 2, female infants. AFP only done today. Scheduled for anatomy scan already on 02/06/2020. - AFP, Serum, Open Spina Bifida  2. Hypokalemia - Comprehensive metabolic panel checked today, will follow up results and manage accordingly.  3. Hyperthyroidism in pregnancy, antepartum Advised to follow up with Endocrinologist as recommended  4. [redacted] weeks gestation of pregnancy 5. Supervision of high risk pregnancy, antepartum Patient lives in [place], interested in transferring care to [facility].  Message sent to the office to make next appointment. Advised to go for MFM scan as scheduled on 02/06/2020. No other complaints or concerns.  Routine obstetric precautions reviewed.  Please refer to After Visit Summary for other counseling recommendations.   Return in about 4 weeks (around 02/13/2020) for OFFICE OB VISIT (MD only) - transferring care to [facility].  Future Appointments  Date Time Provider [place]  02/06/2020 10:15 AM [facility] NURSE [facility]  02/06/2020 10:30 AM [facility] US3 [facility]  02/13/2020  9:10 AM Roma Schanz, CNM [facility]    Verita Schneiders, MD

## 2020-01-17 ENCOUNTER — Other Ambulatory Visit: Payer: BC Managed Care – PPO

## 2020-01-17 DIAGNOSIS — E876 Hypokalemia: Secondary | ICD-10-CM | POA: Insufficient documentation

## 2020-01-17 LAB — COMPREHENSIVE METABOLIC PANEL
ALT: 23 IU/L (ref 0–32)
AST: 21 IU/L (ref 0–40)
Albumin/Globulin Ratio: 1.2 (ref 1.2–2.2)
Albumin: 3.6 g/dL — ABNORMAL LOW (ref 3.9–5.0)
Alkaline Phosphatase: 81 IU/L (ref 44–121)
BUN/Creatinine Ratio: 6 — ABNORMAL LOW (ref 9–23)
BUN: 3 mg/dL — ABNORMAL LOW (ref 6–20)
Bilirubin Total: 0.4 mg/dL (ref 0.0–1.2)
CO2: 19 mmol/L — ABNORMAL LOW (ref 20–29)
Calcium: 8.9 mg/dL (ref 8.7–10.2)
Chloride: 101 mmol/L (ref 96–106)
Creatinine, Ser: 0.5 mg/dL — ABNORMAL LOW (ref 0.57–1.00)
GFR calc Af Amer: 152 mL/min/{1.73_m2} (ref >59)
GFR calc non Af Amer: 132 mL/min/{1.73_m2} (ref >59)
Globulin, Total: 2.9 g/dL (ref 1.5–4.5)
Glucose: 95 mg/dL (ref 65–99)
Potassium: 3.2 mmol/L — ABNORMAL LOW (ref 3.5–5.2)
Sodium: 134 mmol/L (ref 134–144)
Total Protein: 6.5 g/dL (ref 6.0–8.5)

## 2020-01-17 LAB — AFP, SERUM, OPEN SPINA BIFIDA
AFP MoM: 1.83
AFP Value: 52.6 ng/mL
Gest. Age on Collection Date: 16.1 weeks
Maternal Age At EDD: 28.6 yr
OSBR Risk 1 IN: 4795
Test Results:: NEGATIVE
Weight: 235 [lb_av]

## 2020-01-17 MED ORDER — POTASSIUM CHLORIDE CRYS ER 20 MEQ PO TBCR: 40.0000 meq | EXTENDED_RELEASE_TABLET | Freq: Every day | ORAL | 1 refills | Status: DC

## 2020-01-17 NOTE — Addendum Note (Signed)
Addended by: Jaynie Collins A on: 01/17/2020 11:28 PM   Modules accepted: Orders

## 2020-01-18 ENCOUNTER — Encounter: Payer: Self-pay | Admitting: General Practice

## 2020-01-18 DIAGNOSIS — E876 Hypokalemia: Secondary | ICD-10-CM

## 2020-01-18 DIAGNOSIS — O0990 Supervision of high risk pregnancy, unspecified, unspecified trimester: Secondary | ICD-10-CM

## 2020-01-18 DIAGNOSIS — O30042 Twin pregnancy, dichorionic/diamniotic, second trimester: Secondary | ICD-10-CM

## 2020-01-18 DIAGNOSIS — D219 Benign neoplasm of connective and other soft tissue, unspecified: Secondary | ICD-10-CM

## 2020-01-18 DIAGNOSIS — O99210 Obesity complicating pregnancy, unspecified trimester: Secondary | ICD-10-CM

## 2020-01-18 DIAGNOSIS — E0590 Thyrotoxicosis, unspecified without thyrotoxic crisis or storm: Secondary | ICD-10-CM

## 2020-01-19 ENCOUNTER — Other Ambulatory Visit: Payer: BC Managed Care – PPO

## 2020-01-19 ENCOUNTER — Ambulatory Visit: Payer: BC Managed Care – PPO | Admitting: Endocrinology

## 2020-01-21 NOTE — L&D Delivery Note (Signed)
OB/GYN Faculty Practice Delivery Note  Misty Murillo is a 30 y.o. G3P1011 s/p SVD/VAVD of Di-Di at [redacted]w[redacted]d. She was admitted for IOL for gHTN.   ROM: 6h 64m with clear fluid GBS Status: unknown   Maximum Maternal Temperature: 98  Labor Progress: . Initial SVE: 3cm. She was started on pitocin. AROM at 2100. She then progressed to complete.   Delivery Date/Time: Twin A: 0401 on 5/19  Twin B: 0411 on 5/19 Delivery: Called to room and patient was complete and pushing. Dr. Roselie Awkward also called to bedside to supervise. Twin A Head delivered ROA. No nuchal cord present. Shoulder and body delivered in usual fashion. Infant with spontaneous cry, placed on mother's abdomen, dried and stimulated. Cord clamped x 2 after 1-minute delay, and cut by FOB. Cord blood drawn. Twin B confirmed to vert from transverse to cephalic after delivery of Twin A. Noted to be a -1 station, with one push progressed to +1. AROM for clear fluid. With next few pushes noted to have decel to 60's and progressed to +3 station. Kiwi Vaccuum applied with maternal consent and Twin B delivered over one pull with one contraction. Infant with spontaneous cry, placed on mother's abdomen, dried and stimulated. Cord clamped x 2 after 1-minute delay, and cut by FOB. Cord blood drawn. Placentas delivered spontaneously with gentle cord traction. Fundus firm with massage and Pitocin. Labia, perineum, vagina, and cervix inspected inspected with right periurethral laceration, repaired in routine fashion.  Baby Weight: pending  Placenta: Sent to pathology Complications: None Lacerations: right periurethral, repaired EBL: 150 mL Analgesia: Epidural   Infant:  APGAR (1 MIN):    Vita, Currin [[id number]]  [address] [place] [[id number]]  9    APGAR (5 MINS):    Latisha, Lasch [[id number]]  [address] [place] [[id number]]  9    APGAR (10 MINS):    Kyliah, Deanda [[id number]]     Aide, Wojnar [[id number]]       Sharene Skeans, MD [facility] Family Medicine Fellow, [facility] for [facility], [place] 06/07/2020, 4:49 AM

## 2020-01-23 ENCOUNTER — Encounter: Payer: Self-pay | Admitting: *Deleted

## 2020-01-23 ENCOUNTER — Other Ambulatory Visit: Payer: Self-pay

## 2020-01-23 ENCOUNTER — Ambulatory Visit: Payer: BC Managed Care – PPO | Admitting: Endocrinology

## 2020-01-23 ENCOUNTER — Inpatient Hospital Stay (HOSPITAL_COMMUNITY)
Admission: AD | Admit: 2020-01-23 | Discharge: 2020-01-23 | Disposition: A | Discharge status: Home / Self Care | Payer: Medicaid Other | Attending: Obstetrics and Gynecology | Admitting: Obstetrics and Gynecology

## 2020-01-23 DIAGNOSIS — O30042 Twin pregnancy, dichorionic/diamniotic, second trimester: Secondary | ICD-10-CM | POA: Insufficient documentation

## 2020-01-23 DIAGNOSIS — R1032 Left lower quadrant pain: Secondary | ICD-10-CM | POA: Insufficient documentation

## 2020-01-23 DIAGNOSIS — Z3A17 17 weeks gestation of pregnancy: Secondary | ICD-10-CM | POA: Diagnosis not present

## 2020-01-23 DIAGNOSIS — O26899 Other specified pregnancy related conditions, unspecified trimester: Secondary | ICD-10-CM

## 2020-01-23 DIAGNOSIS — O26892 Other specified pregnancy related conditions, second trimester: Secondary | ICD-10-CM

## 2020-01-23 DIAGNOSIS — R109 Unspecified abdominal pain: Secondary | ICD-10-CM | POA: Diagnosis not present

## 2020-01-23 DIAGNOSIS — Z7982 Long term (current) use of aspirin: Secondary | ICD-10-CM | POA: Diagnosis not present

## 2020-01-23 DIAGNOSIS — R102 Pelvic and perineal pain: Secondary | ICD-10-CM

## 2020-01-23 DIAGNOSIS — Z87891 Personal history of nicotine dependence: Secondary | ICD-10-CM | POA: Diagnosis not present

## 2020-01-23 LAB — GC/CHLAMYDIA PROBE AMP (~~LOC~~) NOT AT ARMC
Chlamydia: NEGATIVE
Comment: NEGATIVE
Comment: NORMAL
Neisseria Gonorrhea: NEGATIVE

## 2020-01-23 LAB — URINALYSIS, ROUTINE W REFLEX MICROSCOPIC
Bilirubin Urine: NEGATIVE
Glucose, UA: NEGATIVE mg/dL
Hgb urine dipstick: NEGATIVE
Ketones, ur: 20 mg/dL — AB
Leukocytes,Ua: NEGATIVE
Nitrite: NEGATIVE
Protein, ur: NEGATIVE mg/dL
Specific Gravity, Urine: 1.013 (ref 1.005–1.030)
pH: 5 (ref 5.0–8.0)

## 2020-01-23 LAB — WET PREP, GENITAL
Clue Cells Wet Prep HPF POC: NONE SEEN
Sperm: NONE SEEN
Trich, Wet Prep: NONE SEEN
Yeast Wet Prep HPF POC: NONE SEEN

## 2020-01-23 MED ORDER — COMFORT FIT MATERNITY SUPP LG MISC: 1.0000 "application " | Freq: Every day | 0 refills | Status: DC

## 2020-01-23 MED ORDER — ACETAMINOPHEN 500 MG PO TABS
1000.0000 mg | ORAL_TABLET | Freq: Four times a day (QID) | ORAL | Status: DC | PRN
  Administered 2020-01-23: 1000 mg via ORAL
  Filled 2020-01-23: qty 2

## 2020-01-23 MED ORDER — CYCLOBENZAPRINE HCL 5 MG PO TABS
10.0000 mg | ORAL_TABLET | Freq: Three times a day (TID) | ORAL | Status: DC | PRN
  Administered 2020-01-23: 10 mg via ORAL
  Filled 2020-01-23: qty 2

## 2020-01-23 NOTE — Discharge Instructions (Signed)

## 2020-01-23 NOTE — MAU Note (Signed)
Patient reports pain that starts in left lower abdomen that radiates around to lower back. More intense this morning, pain and pressure in pelvis.

## 2020-01-23 NOTE — MAU Provider Note (Signed)
History     CSN: [id number]  Arrival date and time: 01/23/20 0407   Event Date/Time   First Provider Initiated Contact with Patient 01/23/20 [phone]      Chief Complaint  Patient presents with  . Abdominal Pain    States pain started 4-5 days ago, got more intense this AM. More intense with movement. Wraps around to back.    29 y.o. G3P1011 @17 .1 wks with didi twins presenting with LLQ pain. Pain started last night. Describes as constant and sharp. Rates 10/10. Has not tried anything for it. Movement makes it worse. Denies VB or discharge. Reports 1 day of urinary frequency, no other sx. Denies GI sx. Had 2 BMs yesterday.   OB History    Gravida  3   Para  1   Term  1   Preterm  0   AB  1   Living  1     SAB  0   IAB  1   Ectopic  0   Multiple  0   Live Births  1           Past Medical History:  Diagnosis Date  . Anxiety   . Depression   . Fibroid   . Graves disease   . Graves disease   . Headache   . Liver mass   . Migraines   . PID (acute pelvic inflammatory disease) 07/2019    Past Surgical History:  Procedure Laterality Date  . CHOLECYSTECTOMY    . RESECTION LIVER      Family History  Problem Relation Age of Onset  . Diabetes Mother   . Hyperthyroidism Mother   . Migraines Father   . Cancer Other   . Hyperthyroidism Brother   . Hypertension Brother   . Cancer Maternal Grandmother   . Cancer Maternal Grandfather     Social History   Tobacco Use  . Smoking status: Former Smoker    Packs/day: 0.00    Types: Cigars    Quit date: 01/27/2018    Years since quitting: 1.9  . Smokeless tobacco: Never Used  Vaping Use  . Vaping Use: Never used  Substance Use Topics  . Alcohol use: Not Currently    Comment: occasionally  . Drug use: Not Currently    Types: Marijuana    Comment: last used mid October    Allergies: No Known Allergies  Medications Prior to Admission  Medication Sig Dispense Refill Last Dose  . famotidine (PEPCID)  20 MG tablet Take 1 tablet (20 mg total) by mouth 2 (two) times daily. 30 tablet 3 01/23/2020 at Unknown time  . Prenatal Vit-Fe Fumarate-FA (MULTIVITAMIN-PRENATAL) 27-0.8 MG TABS tablet Take 1 tablet by mouth daily at 12 noon.   01/23/2020 at Unknown time  . Vitamin D 12.5 MCG/0.25ML LIQD Take 3 mLs by mouth daily. For 12 weeks 84 mL 3 Past Week at Unknown time  . aspirin EC 81 MG tablet Take 1 tablet (81 mg total) by mouth daily. Take after 12 weeks for prevention of preeclampsia later in pregnancy 300 tablet 2   . Blood Pressure Monitoring DEVI 1 each by Does not apply route once a week. 1 each 0   . fluticasone (FLONASE) 50 MCG/ACT nasal spray Place 2 sprays into both nostrils daily. 16 g 6   . potassium chloride SA (KLOR-CON) 20 MEQ tablet Take 2 tablets (40 mEq total) by mouth daily for 10 days. 20 tablet 1     Review of Systems  Constitutional: Negative for chills and fever.  Gastrointestinal: Positive for abdominal pain. Negative for constipation, diarrhea, nausea and vomiting.  Genitourinary: Positive for frequency. Negative for dysuria and urgency.  Musculoskeletal: Positive for back pain.   Physical Exam   Blood pressure 128/69, pulse 94, temperature (!) 97.5 F (36.4 C), temperature source Oral, resp. rate 16, height 5\' 3"  (1.6 m), weight 98.4 kg, last menstrual period 09/25/2019, SpO2 99 %, unknown if currently breastfeeding.  Physical Exam Vitals and nursing note reviewed. Exam conducted with a chaperone present.  Constitutional:      General: She is in acute distress (tearful).     Appearance: Normal appearance.  HENT:     Head: Normocephalic and atraumatic.  Cardiovascular:     Rate and Rhythm: Normal rate.  Pulmonary:     Effort: Pulmonary effort is normal. No respiratory distress.  Abdominal:     Palpations: Abdomen is soft.     Tenderness: There is abdominal tenderness in the left lower quadrant. There is no guarding or rebound.  Genitourinary:    Comments: VE:  closed/thick Musculoskeletal:        General: Normal range of motion.     Cervical back: Normal range of motion.  Skin:    General: Skin is warm and dry.  Neurological:     General: No focal deficit present.     Mental Status: She is alert and oriented to person, place, and time.  Psychiatric:        Mood and Affect: Mood normal.        Behavior: Behavior normal.    Limited bedside US: viable, active fetus, +cardiac activity x2, subj. nml AFV  Results for orders placed or performed during the hospital encounter of 01/23/20 (from the past 24 hour(s))  Urinalysis, Routine w reflex microscopic Urine, Clean Catch     Status: Abnormal   Collection Time: 01/23/20  4:39 AM  Result Value Ref Range   Color, Urine YELLOW YELLOW   APPearance CLEAR CLEAR   Specific Gravity, Urine 1.013 1.005 - 1.030   pH 5.0 5.0 - 8.0   Glucose, UA NEGATIVE NEGATIVE mg/dL   Hgb urine dipstick NEGATIVE NEGATIVE   Bilirubin Urine NEGATIVE NEGATIVE   Ketones, ur 20 (A) NEGATIVE mg/dL   Protein, ur NEGATIVE NEGATIVE mg/dL   Nitrite NEGATIVE NEGATIVE   Leukocytes,Ua NEGATIVE NEGATIVE  Wet prep, genital     Status: Abnormal   Collection Time: 01/23/20  5:08 AM  Result Value Ref Range   Yeast Wet Prep HPF POC NONE SEEN NONE SEEN   Trich, Wet Prep NONE SEEN NONE SEEN   Clue Cells Wet Prep HPF POC NONE SEEN NONE SEEN   WBC, Wet Prep HPF POC RARE (A) NONE SEEN   Sperm NONE SEEN    MAU Course  Procedures Tylenol Flexeril  MDM Labs ordered and reviewed. Pain improved, likely RL or MSK. Discussed comfort measures. Stable for discharge home.   Assessment and Plan   1. [redacted] weeks gestation of pregnancy   2. Dichorionic diamniotic twin pregnancy in second trimester   3. Pain of round ligament affecting pregnancy, antepartum    Discharge home Follow up at [facility] as scheduled SAB precautions Rx maternity belt  Allergies as of 01/23/2020   No Known Allergies     Medication List    TAKE these medications    aspirin EC 81 MG tablet Take 1 tablet (81 mg total) by mouth daily. Take after 12 weeks for prevention of preeclampsia later  in pregnancy   Blood Pressure Monitoring Devi 1 each by Does not apply route once a week.   Comfort Fit Maternity Supp Lg Misc 1 application by Does not apply route daily.   famotidine 20 MG tablet Commonly known as: Pepcid Take 1 tablet (20 mg total) by mouth 2 (two) times daily.   fluticasone 50 MCG/ACT nasal spray Commonly known as: FLONASE Place 2 sprays into both nostrils daily.   multivitamin-prenatal 27-0.8 MG Tabs tablet Take 1 tablet by mouth daily at 12 noon.   potassium chloride SA 20 MEQ tablet Commonly known as: KLOR-CON Take 2 tablets (40 mEq total) by mouth daily for 10 days.   Vitamin D 12.5 MCG/0.25ML Liqd Take 3 mLs by mouth daily. For 12 weeks      Julianne Handler, [place] 01/23/2020, 6:11 AM

## 2020-01-23 NOTE — Progress Notes (Deleted)
Patient ID: Misty Murillo, female   DOB: Jun 06, 1990, 30 y.o.   MRN: [id number]                                                                                                                     Reason for Appointment: Probable hyperthyroidism, follow-up visit     Chief complaint: Follow-up of thyroid   History of Present Illness:    The patient was being evaluated at her annual physical exam in 2/20 symptoms of fatigue, anxiety and depression  She was seen in consultation in 03/2018 On her exam she had no abnormal findings and no goiter  However baseline free T3 was high at 4.6 with undetectable TSH  Since her thyrotropin receptor antibody was high normal she was started on methimazole 5 mg daily  On her follow-up in 04/2018 she had less fatigue although her weight had gone up  In July 2020 her thyroid was still consistently normal and methimazole 5 mg was continued In 09/2018 even with her thyroid levels being about the same her thyrotropin receptor antibody had come back to normal  Since then she has been taking methimazole only 3 days a week  Her weight has been stable She does feel tired at times but no palpitations, anxiety or shakiness   Thyroid levels have not changed with free T4 still quite normal and TSH 1.7   Wt Readings from Last 3 Encounters:  01/23/20 217 lb (98.4 kg)  01/16/20 234 lb 9.6 oz (106.4 kg)  12/19/19 239 lb 8 oz (108.6 kg)     Thyroid function tests as follows:     Lab Results  Component Value Date   FREET4 0.87 12/01/2018   FREET4 0.88 10/11/2018   FREET4 0.72 07/22/2018   T3FREE 3.8 12/01/2018   T3FREE 3.7 07/22/2018   T3FREE 4.6 (H) 04/07/2018   TSH 0.248 (L) 12/19/2019   TSH 1.70 12/01/2018   TSH 1.75 10/11/2018    Lab Results  Component Value Date   THYROTRECAB <1.10 10/11/2018   THYROTRECAB 1.73 04/07/2018     Allergies as of 01/23/2020   No Known Allergies     Medication List       Accurate as of January 23, 2020   9:16 AM. If you have any questions, ask your nurse or doctor.        aspirin EC 81 MG tablet Take 1 tablet (81 mg total) by mouth daily. Take after 12 weeks for prevention of preeclampsia later in pregnancy   Blood Pressure Monitoring Devi 1 each by Does not apply route once a week.   Comfort Fit Maternity Supp Lg Misc 1 application by Does not apply route daily.   famotidine 20 MG tablet Commonly known as: Pepcid Take 1 tablet (20 mg total) by mouth 2 (two) times daily.   fluticasone 50 MCG/ACT nasal spray Commonly known as: FLONASE Place 2 sprays into both nostrils daily.   multivitamin-prenatal 27-0.8 MG Tabs tablet Take 1 tablet by mouth daily at  12 noon.   potassium chloride SA 20 MEQ tablet Commonly known as: KLOR-CON Take 2 tablets (40 mEq total) by mouth daily for 10 days.   Vitamin D 12.5 MCG/0.25ML Liqd Take 3 mLs by mouth daily. For 12 weeks           Past Medical History:  Diagnosis Date  . Anxiety   . Depression   . Fibroid   . Graves disease   . Graves disease   . Headache   . Liver mass   . Migraines   . PID (acute pelvic inflammatory disease) 07/2019    Past Surgical History:  Procedure Laterality Date  . CHOLECYSTECTOMY    . RESECTION LIVER      Family History  Problem Relation Age of Onset  . Diabetes Mother   . Hyperthyroidism Mother   . Migraines Father   . Cancer Other   . Hyperthyroidism Brother   . Hypertension Brother   . Cancer Maternal Grandmother   . Cancer Maternal Grandfather     Social History:  reports that she quit smoking about 1 years ago. Her smoking use included cigars. She smoked 0.00 packs per day. She has never used smokeless tobacco. She reports previous alcohol use. She reports previous drug use. Drug: Marijuana.  Allergies: No Known Allergies   Review of Systems  She is on vitamin D supplements   Examination:   LMP 09/25/2019   No tremor present  Assessment/Plan:   Hyperthyroidism, from mild  Graves' disease especially with her family history being positive  She had baseline symptoms of fatigue and anxiety She has been on 5 mg methimazole since about 03/2018, more recently taking this 3 days a week only No previous history of goiter  Recently has had some nonspecific fatigue, likely unrelated  She has not had any change in her thyroid levels even with reducing her methimazole to 3 days a week instead of daily TSH stable at 1.7  Thyrotropin receptor antibody in 9/20 was down to normal This likely indicates that she is in remission now  She will stop her methimazole and let us know if she has any unusual symptoms such as palpitations or increasing fatigue Follow-up in 2 months   Reather Littler 01/23/2020, 9:16 AM    Note: This office note was prepared with Dragon voice recognition system technology. Any transcriptional errors that result from this process are unintentional.

## 2020-01-31 ENCOUNTER — Other Ambulatory Visit (INDEPENDENT_AMBULATORY_CARE_PROVIDER_SITE_OTHER): Payer: Medicaid Other

## 2020-01-31 ENCOUNTER — Other Ambulatory Visit: Payer: Self-pay

## 2020-01-31 ENCOUNTER — Other Ambulatory Visit: Payer: Self-pay | Admitting: Endocrinology

## 2020-01-31 DIAGNOSIS — E0590 Thyrotoxicosis, unspecified without thyrotoxic crisis or storm: Secondary | ICD-10-CM

## 2020-01-31 LAB — CBC WITH DIFFERENTIAL/PLATELET
Basophils Absolute: 0.1 10*3/uL (ref 0.0–0.1)
Basophils Relative: 0.7 % (ref 0.0–3.0)
Eosinophils Absolute: 0 10*3/uL (ref 0.0–0.7)
Eosinophils Relative: 0.1 % (ref 0.0–5.0)
HCT: 28.9 % — ABNORMAL LOW (ref 36.0–46.0)
Hemoglobin: 10.2 g/dL — ABNORMAL LOW (ref 12.0–15.0)
Lymphocytes Relative: 12.1 % (ref 12.0–46.0)
Lymphs Abs: 1.1 10*3/uL (ref 0.7–4.0)
MCHC: 35.2 g/dL (ref 30.0–36.0)
MCV: 84.1 fl (ref 78.0–100.0)
Monocytes Absolute: 0.5 10*3/uL (ref 0.1–1.0)
Monocytes Relative: 4.8 % (ref 3.0–12.0)
Neutro Abs: 7.8 10*3/uL — ABNORMAL HIGH (ref 1.4–7.7)
Neutrophils Relative %: 82.3 % — ABNORMAL HIGH (ref 43.0–77.0)
Platelets: 356 10*3/uL (ref 150.0–400.0)
RBC: 3.44 Mil/uL — ABNORMAL LOW (ref 3.87–5.11)
RDW: 14.4 % (ref 11.5–15.5)
WBC: 9.5 10*3/uL (ref 4.0–10.5)

## 2020-01-31 LAB — T4, FREE: Free T4: 0.82 ng/dL (ref 0.60–1.60)

## 2020-01-31 LAB — TSH: TSH: 0.35 u[IU]/mL (ref 0.35–4.50)

## 2020-02-02 ENCOUNTER — Other Ambulatory Visit: Payer: Self-pay

## 2020-02-02 ENCOUNTER — Encounter: Payer: Self-pay | Admitting: Endocrinology

## 2020-02-02 ENCOUNTER — Ambulatory Visit (INDEPENDENT_AMBULATORY_CARE_PROVIDER_SITE_OTHER): Payer: Medicaid Other | Admitting: Endocrinology

## 2020-02-02 VITALS — BP 112/68 | HR 91 | Wt 234.8 lb

## 2020-02-02 DIAGNOSIS — E0590 Thyrotoxicosis, unspecified without thyrotoxic crisis or storm: Secondary | ICD-10-CM

## 2020-02-02 NOTE — Progress Notes (Signed)
Patient ID: Misty Murillo, female   DOB: [birth year], 30 y.o.   MRN: [id number]                                                                                                                     Reason for Appointment: Probable hyperthyroidism, follow-up visit     Chief complaint: Follow-up of thyroid   History of Present Illness:    The patient was being evaluated at her annual physical exam in 2/20 symptoms of fatigue, anxiety and depression  She was seen in consultation in 03/2018 On her exam she had no abnormal findings and no goiter  However baseline free T3 was high at 4.6 with undetectable TSH  Since her thyrotropin receptor antibody was high normal she was started on methimazole 5 mg daily  On her follow-up in 04/2018 she had less fatigue although her weight had gone up  In July 2020 her thyroid was still consistently normal and methimazole 5 mg was continued In 09/2018 even with her thyroid levels being about the same her thyrotropin receptor antibody had come back to normal Subsequently methimazole was tapered off and stopped in 11/2018 However she did not come back for follow-up  She was retested for thyroid functions by her gynecologist with her early pregnancy in 11/21 Apart from her expected fatigue she has not had any shakiness, palpitations, unusual heat intolerance Her weight appears to be fluctuating  Thyroid levels showed a slightly low TSH in 11/21 but no free T4 or T3 available from then  She is now coming for follow-up visit and her thyroid levels are back to normal including free T4    Wt Readings from Last 3 Encounters:  02/02/20 234 lb 12.8 oz (106.5 kg)  01/23/20 217 lb (98.4 kg)  01/16/20 234 lb 9.6 oz (106.4 kg)     Thyroid function tests as follows:     Lab Results  Component Value Date   FREET4 0.82 01/31/2020   FREET4 0.87 12/01/2018   FREET4 0.88 10/11/2018   T3FREE 3.8 12/01/2018   T3FREE 3.7 07/22/2018   T3FREE 4.6 (H) 04/07/2018    TSH 0.35 01/31/2020   TSH 0.248 (L) 12/19/2019   TSH 1.70 12/01/2018    Lab Results  Component Value Date   THYROTRECAB <1.10 10/11/2018   THYROTRECAB 1.73 04/07/2018     Allergies as of 02/02/2020   No Known Allergies     Medication List       Accurate as of February 02, 2020  4:45 PM. If you have any questions, ask your nurse or doctor.        aspirin EC 81 MG tablet Take 1 tablet (81 mg total) by mouth daily. Take after 12 weeks for prevention of preeclampsia later in pregnancy   Blood Pressure Monitoring Devi 1 each by Does not apply route once a week.   Comfort Fit Maternity Supp Lg Misc 1 application by Does not apply route daily.   famotidine 20 MG tablet Commonly known  as: Pepcid Take 1 tablet (20 mg total) by mouth 2 (two) times daily.   fluticasone 50 MCG/ACT nasal spray Commonly known as: FLONASE Place 2 sprays into both nostrils daily.   multivitamin-prenatal 27-0.8 MG Tabs tablet Take 1 tablet by mouth daily at 12 noon.   potassium chloride SA 20 MEQ tablet Commonly known as: KLOR-CON Take 2 tablets (40 mEq total) by mouth daily for 10 days.   Vitamin D 12.5 MCG/0.25ML Liqd Take 3 mLs by mouth daily. For 12 weeks           Past Medical History:  Diagnosis Date  . Anxiety   . Depression   . Fibroid   . Graves disease   . Graves disease   . Headache   . Liver mass   . Migraines   . PID (acute pelvic inflammatory disease) 07/2019    Past Surgical History:  Procedure Laterality Date  . CHOLECYSTECTOMY    . RESECTION LIVER      Family History  Problem Relation Age of Onset  . Diabetes Mother   . Hyperthyroidism Mother   . Migraines Father   . Cancer Other   . Hyperthyroidism Brother   . Hypertension Brother   . Cancer Maternal Grandmother   . Cancer Maternal Grandfather     Social History:  reports that she quit smoking about 2 years ago. Her smoking use included cigars. She smoked 0.00 packs per day. She has never used  smokeless tobacco. She reports previous alcohol use. She reports previous drug use. Drug: Marijuana.  Allergies: No Known Allergies   Review of Systems     Examination:   BP 112/68   Pulse 91   Wt 234 lb 12.8 oz (106.5 kg)   LMP 09/25/2019   SpO2 97%   BMI 41.59 kg/m   Thyroid nonpalpable Skin not unusually warm or sweaty No tremor present Biceps reflexes appear normal  Assessment/Plan:  History of Hyperthyroidism, from mild Graves' disease   She had baseline symptoms of fatigue and anxiety and took methimazole in 2020 until 11/2018  Although her TSH was slightly low in 11/21 she has been asymptomatic Currently does not show any unusual weight loss, thyroid enlargement or signs of hyperreflexia on exam  Her TSH is low normal at 0.35 but free T4 is quite normal She is appearing to be in remission from her hyperthyroidism Reassured her that her current thyroid levels are normal and will not affect her pregnancy  Thyrotropin receptor antibody in 9/20 was down to normal  Follow-up in 2 months to make sure her thyroid levels stay consistent   Elayne Snare 02/02/2020, 4:45 PM    Note: This office note was prepared with Dragon voice recognition system technology. Any transcriptional errors that result from this process are unintentional.

## 2020-02-06 ENCOUNTER — Ambulatory Visit: Payer: Medicaid Other

## 2020-02-09 ENCOUNTER — Telehealth: Payer: Self-pay | Admitting: Women's Health

## 2020-02-09 NOTE — Telephone Encounter (Signed)
Attempted to call patient. Heard message that vm is not set up. Also sent mychart message with preg safe meds.

## 2020-02-09 NOTE — Telephone Encounter (Signed)
Patient called stating that she is not feeling well, pt states that she does not think she has COVID because she has not been around anyone that has been exposed or she doesn't leave the home. Pt states she thinks she might have the FLU. Pt states that she is have extreme body pain, with slight fever, Patient would like to know what she can take. Please contact pt

## 2020-02-10 ENCOUNTER — Encounter (HOSPITAL_COMMUNITY): Payer: Self-pay | Admitting: Emergency Medicine

## 2020-02-10 ENCOUNTER — Emergency Department (HOSPITAL_COMMUNITY)
Admission: EM | Admit: 2020-02-10 | Discharge: 2020-02-10 | Disposition: A | Discharge status: Home / Self Care | Payer: Medicaid Other | Attending: Emergency Medicine | Admitting: Emergency Medicine

## 2020-02-10 ENCOUNTER — Emergency Department (HOSPITAL_COMMUNITY): Payer: Medicaid Other

## 2020-02-10 ENCOUNTER — Other Ambulatory Visit: Payer: Self-pay

## 2020-02-10 DIAGNOSIS — O99282 Endocrine, nutritional and metabolic diseases complicating pregnancy, second trimester: Secondary | ICD-10-CM | POA: Insufficient documentation

## 2020-02-10 DIAGNOSIS — Z3A20 20 weeks gestation of pregnancy: Secondary | ICD-10-CM

## 2020-02-10 DIAGNOSIS — E876 Hypokalemia: Secondary | ICD-10-CM

## 2020-02-10 DIAGNOSIS — O26892 Other specified pregnancy related conditions, second trimester: Secondary | ICD-10-CM | POA: Diagnosis present

## 2020-02-10 DIAGNOSIS — R000 Tachycardia, unspecified: Secondary | ICD-10-CM | POA: Diagnosis not present

## 2020-02-10 DIAGNOSIS — O219 Vomiting of pregnancy, unspecified: Secondary | ICD-10-CM | POA: Insufficient documentation

## 2020-02-10 DIAGNOSIS — Z7982 Long term (current) use of aspirin: Secondary | ICD-10-CM | POA: Diagnosis not present

## 2020-02-10 DIAGNOSIS — R112 Nausea with vomiting, unspecified: Secondary | ICD-10-CM

## 2020-02-10 DIAGNOSIS — O98512 Other viral diseases complicating pregnancy, second trimester: Secondary | ICD-10-CM | POA: Diagnosis not present

## 2020-02-10 DIAGNOSIS — U071 COVID-19: Secondary | ICD-10-CM

## 2020-02-10 DIAGNOSIS — R197 Diarrhea, unspecified: Secondary | ICD-10-CM | POA: Insufficient documentation

## 2020-02-10 DIAGNOSIS — Z87891 Personal history of nicotine dependence: Secondary | ICD-10-CM | POA: Diagnosis not present

## 2020-02-10 LAB — CBC WITH DIFFERENTIAL/PLATELET
Abs Immature Granulocytes: 0.07 10*3/uL (ref 0.00–0.07)
Basophils Absolute: 0 10*3/uL (ref 0.0–0.1)
Basophils Relative: 0 %
Eosinophils Absolute: 0 10*3/uL (ref 0.0–0.5)
Eosinophils Relative: 0 %
HCT: 29.8 % — ABNORMAL LOW (ref 36.0–46.0)
Hemoglobin: 10.3 g/dL — ABNORMAL LOW (ref 12.0–15.0)
Immature Granulocytes: 1 %
Lymphocytes Relative: 10 %
Lymphs Abs: 0.6 10*3/uL — ABNORMAL LOW (ref 0.7–4.0)
MCH: 29.7 pg (ref 26.0–34.0)
MCHC: 34.6 g/dL (ref 30.0–36.0)
MCV: 85.9 fL (ref 80.0–100.0)
Monocytes Absolute: 0.4 10*3/uL (ref 0.1–1.0)
Monocytes Relative: 6 %
Neutro Abs: 5.4 10*3/uL (ref 1.7–7.7)
Neutrophils Relative %: 83 %
Platelets: 264 10*3/uL (ref 150–400)
RBC: 3.47 MIL/uL — ABNORMAL LOW (ref 3.87–5.11)
RDW: 13.7 % (ref 11.5–15.5)
WBC: 6.5 10*3/uL (ref 4.0–10.5)
nRBC: 0 % (ref 0.0–0.2)

## 2020-02-10 LAB — COMPREHENSIVE METABOLIC PANEL
ALT: 35 U/L (ref 0–44)
AST: 39 U/L (ref 15–41)
Albumin: 2.9 g/dL — ABNORMAL LOW (ref 3.5–5.0)
Alkaline Phosphatase: 96 U/L (ref 38–126)
Anion gap: 10 (ref 5–15)
BUN: <5 mg/dL — ABNORMAL LOW (ref 6–20)
CO2: 20 mmol/L — ABNORMAL LOW (ref 22–32)
Calcium: 8.6 mg/dL — ABNORMAL LOW (ref 8.9–10.3)
Chloride: 101 mmol/L (ref 98–111)
Creatinine, Ser: 0.54 mg/dL (ref 0.44–1.00)
GFR, Estimated: >60 mL/min (ref >60)
Glucose, Bld: 109 mg/dL — ABNORMAL HIGH (ref 70–99)
Potassium: 3.1 mmol/L — ABNORMAL LOW (ref 3.5–5.1)
Sodium: 131 mmol/L — ABNORMAL LOW (ref 135–145)
Total Bilirubin: 0.4 mg/dL (ref 0.3–1.2)
Total Protein: 7 g/dL (ref 6.5–8.1)

## 2020-02-10 LAB — URINALYSIS, ROUTINE W REFLEX MICROSCOPIC
Bilirubin Urine: NEGATIVE
Glucose, UA: NEGATIVE mg/dL
Hgb urine dipstick: NEGATIVE
Ketones, ur: 80 mg/dL — AB
Leukocytes,Ua: NEGATIVE
Nitrite: NEGATIVE
Protein, ur: NEGATIVE mg/dL
Specific Gravity, Urine: 1.012 (ref 1.005–1.030)
pH: 5 (ref 5.0–8.0)

## 2020-02-10 LAB — LACTIC ACID, PLASMA
Lactic Acid, Venous: 1.1 mmol/L (ref 0.5–1.9)
Lactic Acid, Venous: 1.4 mmol/L (ref 0.5–1.9)

## 2020-02-10 LAB — SARS CORONAVIRUS 2 BY RT PCR (HOSPITAL ORDER, PERFORMED IN ~~LOC~~ HOSPITAL LAB): SARS Coronavirus 2: POSITIVE — AB

## 2020-02-10 MED ORDER — ONDANSETRON HCL 4 MG/2ML IJ SOLN
4.0000 mg | Freq: Once | INTRAMUSCULAR | Status: AC
  Administered 2020-02-10: 4 mg via INTRAVENOUS
  Filled 2020-02-10: qty 2

## 2020-02-10 MED ORDER — ONDANSETRON HCL 4 MG PO TABS: 4.0000 mg | ORAL_TABLET | Freq: Four times a day (QID) | ORAL | 0 refills | Status: DC

## 2020-02-10 MED ORDER — POTASSIUM CHLORIDE CRYS ER 20 MEQ PO TBCR
40.0000 meq | EXTENDED_RELEASE_TABLET | Freq: Once | ORAL | Status: AC
  Administered 2020-02-10: 40 meq via ORAL
  Filled 2020-02-10: qty 2

## 2020-02-10 MED ORDER — SODIUM CHLORIDE 0.9 % IV BOLUS
1000.0000 mL | Freq: Once | INTRAVENOUS | Status: AC
  Administered 2020-02-10: 1000 mL via INTRAVENOUS

## 2020-02-10 MED ORDER — POTASSIUM CHLORIDE CRYS ER 20 MEQ PO TBCR: 20.0000 meq | EXTENDED_RELEASE_TABLET | Freq: Two times a day (BID) | ORAL | 0 refills | Status: DC

## 2020-02-10 NOTE — ED Notes (Signed)
Date and time results received: 02/10/20 0644  Test: covid Critical Value: positive  Name of Provider Notified: Tomi Bamberger MD

## 2020-02-10 NOTE — Discharge Instructions (Signed)
Take Tylenol as needed for body aches and/or fever.  Drink plenty of fluids (clear liquids) then start a bland diet later this morning such as toast, crackers, jello, [employer] chicken noodle soup. Use the zofran for nausea or vomiting. Take imodium OTC for diarrhea. Avoid milk products until the diarrhea is gone. Please consult with your OB office to see if you can take zinc 50 mg once a day, vitamin D and vitamin C daily for your COVID infection.  Please keep your regularly scheduled OB appointments.  If you should start getting short of breath you should be reevaluated and if your oxygen is low you will need to be admitted to the hospital.

## 2020-02-10 NOTE — ED Provider Notes (Signed)
Patient signed out to me pending urinalysis.  UA shows no signs of UTI.  Patient is laying comfortably in bed.  Bedside ultrasound identifies twin gestation, movement of both babies with heart rates in the 150-160s.  On-call OB/GYN was contacted by previous provider, patient knows to follow-up as an outpatient.  She has been educated and given information on COVID infection in the setting of pregnancy and how to treat herself.  Patient will be discharged and treated as an outpatient.  Discharge plan and strict return to ED precautions discussed, patient verbalizes understanding and agreement.   Lorelle Gibbs, DO 02/10/20 0900

## 2020-02-10 NOTE — ED Provider Notes (Signed)
[facility] EMERGENCY DEPARTMENT Provider Note   CSN: [id number] Arrival date & time: 02/10/20  0302   Time seen 4:50 AM  History Chief Complaint  Patient presents with   Emesis    Misty Murillo is a 30 y.o. female.  HPI   Patient states she started feeling bad 5 days ago.  She has a headache that moves around but currently is in the back of her head.  She also has posterior neck pain.  She has body aches and chills but has not checked her temperature.  She has had a cough with mild sore throat with clear rhinorrhea.  She states she has had nausea and vomiting about 10 times a day and diarrhea once or twice a day.  She has abdominal pain and points to the lateral aspect of her abdomen and her lower abdomen that started yesterday afternoon, January 20.  She has chest pain when she coughs in the center of her chest.  She states she feels short of breath when she lays down and mildly on exertion.  She has a lot of cramps in her legs.  Patient is G3, P1 Ab1, currently [redacted] weeks pregnant with [facility].  Patient has not taken a COVID-vaccine  PCP Orland Mustard, MD [facility]  Past Medical History:  Diagnosis Date   Anxiety    Depression    Fibroid    Graves disease    Graves disease    Headache    Liver mass    Migraines    PID (acute pelvic inflammatory disease) 07/2019    Patient Active Problem List   Diagnosis Date Noted   Hypokalemia 01/17/2020   Supervision of high risk pregnancy, antepartum 12/12/2019   Fibroid    Hyperthyroidism in pregnancy, antepartum    Twin pregnancy 07/2019   Obesity in pregnancy 07/2019   Graves' disease 08/03/2018   Hyperthyroidism 03/08/2018   Vitamin D deficiency 03/08/2018   Chronic urticaria 07/11/2017   Angioedema 07/11/2017   Sickle cell trait (HCC) 05/26/2017   Liver mass, left lobe 05/18/2017    Past Surgical History:  Procedure Laterality Date   CHOLECYSTECTOMY     RESECTION  LIVER       OB History    Gravida  3   Para  1   Term  1   Preterm  0   AB  1   Living  1     SAB  0   IAB  1   Ectopic  0   Multiple  0   Live Births  1           Family History  Problem Relation Age of Onset   Diabetes Mother    Hyperthyroidism Mother    Migraines Father    Cancer Other    Hyperthyroidism Brother    Hypertension Brother    Cancer Maternal Grandmother    Cancer Maternal Grandfather     Social History   Tobacco Use   Smoking status: Former Smoker    Packs/day: 0.00    Types: Cigars    Quit date: 01/27/2018    Years since quitting: 2.0   Smokeless tobacco: Never Used  Vaping Use   Vaping Use: Never used  Substance Use Topics   Alcohol use: Not Currently    Comment: occasionally   Drug use: Not Currently    Types: Marijuana    Comment: last used mid October    Home Medications Prior to  Admission medications   Medication Sig Start Date End Date Taking? Authorizing Provider  ondansetron (ZOFRAN) 4 MG tablet Take 1 tablet (4 mg total) by mouth every 6 (six) hours. 02/10/20  Yes Devoria Albe, MD  potassium chloride SA (KLOR-CON) 20 MEQ tablet Take 1 tablet (20 mEq total) by mouth 2 (two) times daily. 02/10/20  Yes Devoria Albe, MD  aspirin EC 81 MG tablet Take 1 tablet (81 mg total) by mouth daily. Take after 12 weeks for prevention of preeclampsia later in pregnancy 12/19/19   Hermina Staggers, MD  Blood Pressure Monitoring DEVI 1 each by Does not apply route once a week. 12/19/19   Hermina Staggers, MD  Elastic Bandages & Supports (COMFORT FIT MATERNITY SUPP LG) MISC 1 application by Does not apply route daily. 01/23/20   Donette Larry, CNM  famotidine (PEPCID) 20 MG tablet Take 1 tablet (20 mg total) by mouth 2 (two) times daily. 12/23/19   Hermina Staggers, MD  fluticasone (FLONASE) 50 MCG/ACT nasal spray Place 2 sprays into both nostrils daily. Patient not taking: Reported on 02/02/2020 05/21/18   Orland Mustard, MD  Prenatal  Vit-Fe Fumarate-FA (MULTIVITAMIN-PRENATAL) 27-0.8 MG TABS tablet Take 1 tablet by mouth daily at 12 noon.    [provider]  Vitamin D 12.5 MCG/0.25ML LIQD Take 3 mLs by mouth daily. For 12 weeks Patient not taking: Reported on 02/02/2020 03/15/18   Orland Mustard, MD    Allergies    Patient has no known allergies.  Review of Systems   Review of Systems  All other systems reviewed and are negative.   Physical Exam Updated Vital Signs BP 111/73    Pulse (!) 109    Temp 99.1 F (37.3 C) (Oral)    Resp 18    Ht 5\' 3"  (1.6 m)    Wt 106.1 kg    LMP 09/25/2019    SpO2 100%    BMI 41.45 kg/m   Physical Exam Vitals and nursing note reviewed.  Constitutional:      General: She is not in acute distress.    Appearance: Normal appearance. She is obese. She is not ill-appearing or toxic-appearing.  HENT:     Head: Normocephalic and atraumatic.     Right Ear: External ear normal.     Left Ear: External ear normal.     Mouth/Throat:     Mouth: Mucous membranes are dry.  Eyes:     Extraocular Movements: Extraocular movements intact.     Conjunctiva/sclera: Conjunctivae normal.     Pupils: Pupils are equal, round, and reactive to light.  Cardiovascular:     Rate and Rhythm: Regular rhythm. Tachycardia present.     Heart sounds: Normal heart sounds. No murmur heard.   Pulmonary:     Effort: Pulmonary effort is normal. No respiratory distress.     Breath sounds: Normal breath sounds.  Abdominal:     General: Bowel sounds are normal.     Palpations: Abdomen is soft.     Tenderness: There is abdominal tenderness. There is no guarding or rebound.     Comments: Nurses found at least 1 heartbeat at 178.  Her uterus is above the umbilicus.  Patient is noted to have a large midline upper abdominal scar from resection of a hemangioma of her liver.  She states they removed the smaller one but there is a larger one they could not remove because it was involved with another major blood  vessel.  Musculoskeletal:  Cervical back: Normal range of motion and neck supple.  Neurological:     Mental Status: She is alert.     ED Results / Procedures / Treatments   Labs (all labs ordered are listed, but only abnormal results are displayed) Results for orders placed or performed during the hospital encounter of 02/10/20  SARS Coronavirus 2 by RT PCR (hospital order, performed in [facility] hospital lab) Nasopharyngeal Nasopharyngeal Swab   Specimen: Nasopharyngeal Swab  Result Value Ref Range   SARS Coronavirus 2 POSITIVE (A) NEGATIVE  Comprehensive metabolic panel  Result Value Ref Range   Sodium 131 (L) 135 - 145 mmol/L   Potassium 3.1 (L) 3.5 - 5.1 mmol/L   Chloride 101 98 - 111 mmol/L   CO2 20 (L) 22 - 32 mmol/L   Glucose, Bld 109 (H) 70 - 99 mg/dL   BUN <5 (L) 6 - 20 mg/dL   Creatinine, Ser 0.54 0.44 - 1.00 mg/dL   Calcium 8.6 (L) 8.9 - 10.3 mg/dL   Total Protein 7.0 6.5 - 8.1 g/dL   Albumin 2.9 (L) 3.5 - 5.0 g/dL   AST 39 15 - 41 U/L   ALT 35 0 - 44 U/L   Alkaline Phosphatase 96 38 - 126 U/L   Total Bilirubin 0.4 0.3 - 1.2 mg/dL   GFR, Estimated >60 >60 mL/min   Anion gap 10 5 - 15  Lactic acid, plasma  Result Value Ref Range   Lactic Acid, Venous 1.1 0.5 - 1.9 mmol/L  CBC with Differential  Result Value Ref Range   WBC 6.5 4.0 - 10.5 K/uL   RBC 3.47 (L) 3.87 - 5.11 MIL/uL   Hemoglobin 10.3 (L) 12.0 - 15.0 g/dL   HCT 29.8 (L) 36.0 - 46.0 %   MCV 85.9 80.0 - 100.0 fL   MCH 29.7 26.0 - 34.0 pg   MCHC 34.6 30.0 - 36.0 g/dL   RDW 13.7 11.5 - 15.5 %   Platelets 264 150 - 400 K/uL   nRBC 0.0 0.0 - 0.2 %   Neutrophils Relative % 83 %   Neutro Abs 5.4 1.7 - 7.7 K/uL   Lymphocytes Relative 10 %   Lymphs Abs 0.6 (L) 0.7 - 4.0 K/uL   Monocytes Relative 6 %   Monocytes Absolute 0.4 0.1 - 1.0 K/uL   Eosinophils Relative 0 %   Eosinophils Absolute 0.0 0.0 - 0.5 K/uL   Basophils Relative 0 %   Basophils Absolute 0.0 0.0 - 0.1 K/uL   Immature Granulocytes  1 %   Abs Immature Granulocytes 0.07 0.00 - 0.07 K/uL   Laboratory interpretation all normal except persistent hypokalemia, stable anemia,, positive COVID    EKG None  Radiology DG Chest Port 1 View  Result Date: 02/10/2020 CLINICAL DATA:  Suspected COVID EXAM: PORTABLE CHEST 1 VIEW COMPARISON:  06/01/2019 FINDINGS: Normal heart size and mediastinal contours. Low volume chest. No acute infiltrate or edema. No effusion or pneumothorax. No acute osseous findings. IMPRESSION: Negative low volume chest. Electronically Signed   By: Monte Fantasia M.D.   On: 02/10/2020 05:45    Procedures Procedures (including critical care time)  Medications Ordered in ED Medications  sodium chloride 0.9 % bolus 1,000 mL (has no administration in time range)  potassium chloride SA (KLOR-CON) CR tablet 40 mEq (has no administration in time range)  sodium chloride 0.9 % bolus 1,000 mL (1,000 mLs Intravenous New Bag/Given 02/10/20 0613)  ondansetron (ZOFRAN) injection 4 mg (4 mg Intravenous Given 02/10/20 0609)  ED Course  I have reviewed the triage vital signs and the nursing notes.  Pertinent labs & imaging results that were available during my care of the patient were reviewed by me and considered in my medical decision making (see chart for details).    MDM Rules/Calculators/A&P                          Patient presents with COVID-like symptoms for the past 5 days.  During our interview her pulse ox was 94 but did dip down to 92% on room air.  IV was inserted and she was given IV fluids.  Laboratory testing was done, concern was for an electrolyte abnormality with  all the vomiting and diarrhea that she has had.  Patient's blood work has returned and is basically similar to the blood work she has had in the last several months.  I talked to the patient at 7:10 AM she has finished her first liter of IV fluids.  She states she does not have the urge to have any urine output yet.  She states she  still not feeling the baby's move is much as normal.  When I palpate her abdomen I can feel some movement that she is not feeling.  Nursing staff did get at least 1 heart rate of 172.  Patient was informed her COVID test is positive.  She was given a second liter of IV fluids.  She is asking me again about taking oral fluids.  We will give her a trial.  Her hypokalemia was treated with oral potassium.  7:25 AM patient was discussed with Dr.Anyanwu, OB on-call at [facility].  She states that 20 weeks she is not as concerned about the patient feeling fetal movement.  She suggests bedside ultrasound to look for 2 fetal heartbeats.  And if patient gets short of breath she should be instructed to come down to [facility] to the MAU.  Dr. Raliegh Ip. Horton will check the results of her urinalysis before discharge.   Final Clinical Impression(s) / ED Diagnoses Final diagnoses:  COVID-19 virus infection  [redacted] weeks gestation of pregnancy  Nausea vomiting and diarrhea  Hypokalemia    Rx / DC Orders ED Discharge Orders         Ordered    potassium chloride SA (KLOR-CON) 20 MEQ tablet  2 times daily        02/10/20 0736    ondansetron (ZOFRAN) 4 MG tablet  Every 6 hours        02/10/20 0736         Plan discharge  Rolland Porter, MD, Barbette Or, MD 02/10/20 [phone]

## 2020-02-10 NOTE — ED Triage Notes (Signed)
[facility] - pt c/o N/V, chills, and headache x 5 days. Pt also states that she has not felt fetal movement x 2 days. Pt about [redacted] weeks pregnant with twins.

## 2020-02-12 ENCOUNTER — Emergency Department (HOSPITAL_COMMUNITY): Payer: Medicaid Other

## 2020-02-12 ENCOUNTER — Encounter (HOSPITAL_COMMUNITY): Payer: Self-pay

## 2020-02-12 ENCOUNTER — Emergency Department (HOSPITAL_COMMUNITY)
Admission: EM | Admit: 2020-02-12 | Discharge: 2020-02-12 | Disposition: A | Discharge status: Home / Self Care | Payer: Medicaid Other | Attending: Emergency Medicine | Admitting: Emergency Medicine

## 2020-02-12 ENCOUNTER — Other Ambulatory Visit: Payer: Self-pay

## 2020-02-12 DIAGNOSIS — O98512 Other viral diseases complicating pregnancy, second trimester: Secondary | ICD-10-CM | POA: Insufficient documentation

## 2020-02-12 DIAGNOSIS — Z87891 Personal history of nicotine dependence: Secondary | ICD-10-CM | POA: Diagnosis not present

## 2020-02-12 DIAGNOSIS — Z7982 Long term (current) use of aspirin: Secondary | ICD-10-CM | POA: Diagnosis not present

## 2020-02-12 DIAGNOSIS — Z3A20 20 weeks gestation of pregnancy: Secondary | ICD-10-CM | POA: Insufficient documentation

## 2020-02-12 DIAGNOSIS — O99282 Endocrine, nutritional and metabolic diseases complicating pregnancy, second trimester: Secondary | ICD-10-CM | POA: Diagnosis not present

## 2020-02-12 DIAGNOSIS — E876 Hypokalemia: Secondary | ICD-10-CM | POA: Diagnosis not present

## 2020-02-12 DIAGNOSIS — J1282 Pneumonia due to coronavirus disease 2019: Secondary | ICD-10-CM | POA: Diagnosis not present

## 2020-02-12 DIAGNOSIS — U071 COVID-19: Secondary | ICD-10-CM

## 2020-02-12 LAB — MAGNESIUM: Magnesium: 2.1 mg/dL (ref 1.7–2.4)

## 2020-02-12 LAB — BASIC METABOLIC PANEL
Anion gap: 9 (ref 5–15)
BUN: <5 mg/dL — ABNORMAL LOW (ref 6–20)
CO2: 20 mmol/L — ABNORMAL LOW (ref 22–32)
Calcium: 8.3 mg/dL — ABNORMAL LOW (ref 8.9–10.3)
Chloride: 102 mmol/L (ref 98–111)
Creatinine, Ser: 0.52 mg/dL (ref 0.44–1.00)
GFR, Estimated: >60 mL/min (ref >60)
Glucose, Bld: 114 mg/dL — ABNORMAL HIGH (ref 70–99)
Potassium: 2.8 mmol/L — ABNORMAL LOW (ref 3.5–5.1)
Sodium: 131 mmol/L — ABNORMAL LOW (ref 135–145)

## 2020-02-12 LAB — CBC WITH DIFFERENTIAL/PLATELET
Abs Immature Granulocytes: 0.07 10*3/uL (ref 0.00–0.07)
Basophils Absolute: 0 10*3/uL (ref 0.0–0.1)
Basophils Relative: 0 %
Eosinophils Absolute: 0 10*3/uL (ref 0.0–0.5)
Eosinophils Relative: 0 %
HCT: 30.2 % — ABNORMAL LOW (ref 36.0–46.0)
Hemoglobin: 10.7 g/dL — ABNORMAL LOW (ref 12.0–15.0)
Immature Granulocytes: 1 %
Lymphocytes Relative: 9 %
Lymphs Abs: 0.6 10*3/uL — ABNORMAL LOW (ref 0.7–4.0)
MCH: 30 pg (ref 26.0–34.0)
MCHC: 35.4 g/dL (ref 30.0–36.0)
MCV: 84.6 fL (ref 80.0–100.0)
Monocytes Absolute: 0.3 10*3/uL (ref 0.1–1.0)
Monocytes Relative: 4 %
Neutro Abs: 5.1 10*3/uL (ref 1.7–7.7)
Neutrophils Relative %: 86 %
Platelets: 245 10*3/uL (ref 150–400)
RBC: 3.57 MIL/uL — ABNORMAL LOW (ref 3.87–5.11)
RDW: 13.4 % (ref 11.5–15.5)
WBC: 6.1 10*3/uL (ref 4.0–10.5)
nRBC: 0 % (ref 0.0–0.2)

## 2020-02-12 MED ORDER — ONDANSETRON HCL 4 MG/2ML IJ SOLN
4.0000 mg | Freq: Once | INTRAMUSCULAR | Status: AC
  Administered 2020-02-12: 4 mg via INTRAVENOUS
  Filled 2020-02-12: qty 2

## 2020-02-12 MED ORDER — POTASSIUM CHLORIDE 10 MEQ/100ML IV SOLN
10.0000 meq | INTRAVENOUS | Status: AC
  Administered 2020-02-12 (×2): 10 meq via INTRAVENOUS
  Filled 2020-02-12 (×2): qty 100

## 2020-02-12 MED ORDER — ALBUTEROL SULFATE HFA 108 (90 BASE) MCG/ACT IN AERS
2.0000 | INHALATION_SPRAY | Freq: Once | RESPIRATORY_TRACT | Status: AC
  Administered 2020-02-12: 2 via RESPIRATORY_TRACT
  Filled 2020-02-12: qty 6.7

## 2020-02-12 MED ORDER — POTASSIUM CHLORIDE CRYS ER 20 MEQ PO TBCR
40.0000 meq | EXTENDED_RELEASE_TABLET | Freq: Once | ORAL | Status: AC
  Administered 2020-02-12: 40 meq via ORAL
  Filled 2020-02-12: qty 2

## 2020-02-12 MED ORDER — SODIUM CHLORIDE 0.9 % IV BOLUS
1000.0000 mL | Freq: Once | INTRAVENOUS | Status: AC
  Administered 2020-02-12: 1000 mL via INTRAVENOUS

## 2020-02-12 NOTE — ED Provider Notes (Signed)
[facility] EMERGENCY DEPARTMENT Provider Note   CSN: [id number] Arrival date & time: 02/12/20  [id number]     History Chief Complaint  Patient presents with  . Shortness of Breath    Misty Murillo is a 30 y.o. female.  HPI      Misty Murillo is a 30 y.o. female G3P1AB1 at [redacted] weeks gestation, who presents to the Emergency Department complaining of worsening cough, body aches, generalized weakness and shortness of breath since being diagnosed with COVID on 02/10/2020.  She describes shortness of breath associated with persistent episodes of coughing, talking and exertion.  She describes her cough as productive of thick sputum with streaks of blood.  She woke up early this morning and felt as though she could not breathe.  She took Tylenol and drank tea without relief.  She also complains of multiple episodes of vomiting and diarrhea.  She was seen here Friday for similar symptoms when she was diagnosed with COVID.  She denies any complications with her pregnancy, vaginal bleeding or spotting.  She has been having some abdominal cramping as well as cramping in her legs.  [facility].  She is unvaccinated for COVID-19.    Past Medical History:  Diagnosis Date  . Anxiety   . Depression   . Fibroid   . Graves disease   . Graves disease   . Headache   . Liver mass   . Migraines   . PID (acute pelvic inflammatory disease) 07/2019    Patient Active Problem List   Diagnosis Date Noted  . Hypokalemia 01/17/2020  . Supervision of high risk pregnancy, antepartum 12/12/2019  . Fibroid   . Hyperthyroidism in pregnancy, antepartum   . Twin pregnancy 07/2019  . Obesity in pregnancy 07/2019  . Graves' disease 08/03/2018  . Hyperthyroidism 03/08/2018  . Vitamin D deficiency 03/08/2018  . Chronic urticaria 07/11/2017  . Angioedema 07/11/2017  . Sickle cell trait ([place]) 05/26/2017  . Liver mass, left lobe 05/18/2017    Past Surgical  History:  Procedure Laterality Date  . CHOLECYSTECTOMY    . RESECTION LIVER       OB History    Gravida  3   Para  1   Term  1   Preterm  0   AB  1   Living  1     SAB  0   IAB  1   Ectopic  0   Multiple  0   Live Births  1           Family History  Problem Relation Age of Onset  . Diabetes Mother   . Hyperthyroidism Mother   . Migraines Father   . Cancer Other   . Hyperthyroidism Brother   . Hypertension Brother   . Cancer Maternal Grandmother   . Cancer Maternal Grandfather     Social History   Tobacco Use  . Smoking status: Former Smoker    Packs/day: 0.00    Types: Cigars    Quit date: 01/27/2018    Years since quitting: 2.0  . Smokeless tobacco: Never Used  Vaping Use  . Vaping Use: Never used  Substance Use Topics  . Alcohol use: Not Currently    Comment: occasionally  . Drug use: Not Currently    Types: Marijuana    Comment: last used mid October    Home Medications Prior to Admission medications   Medication Sig Start Date End  Date Taking? Authorizing Provider  aspirin EC 81 MG tablet Take 1 tablet (81 mg total) by mouth daily. Take after 12 weeks for prevention of preeclampsia later in pregnancy 12/19/19   Chancy Milroy, MD  Blood Pressure Monitoring DEVI 1 each by Does not apply route once a week. 12/19/19   Chancy Milroy, MD  Elastic Bandages & Supports (COMFORT FIT MATERNITY SUPP LG) MISC 1 application by Does not apply route daily. 01/23/20   Julianne Handler, CNM  famotidine (PEPCID) 20 MG tablet Take 1 tablet (20 mg total) by mouth 2 (two) times daily. 12/23/19   Chancy Milroy, MD  fluticasone (FLONASE) 50 MCG/ACT nasal spray Place 2 sprays into both nostrils daily. Patient not taking: Reported on 02/02/2020 05/21/18   Orma Flaming, MD  ondansetron (ZOFRAN) 4 MG tablet Take 1 tablet (4 mg total) by mouth every 6 (six) hours. 02/10/20   Rolland Porter, MD  potassium chloride SA (KLOR-CON) 20 MEQ tablet Take 1 tablet (20 mEq  total) by mouth 2 (two) times daily. 02/10/20   Rolland Porter, MD  Prenatal Vit-Fe Fumarate-FA (MULTIVITAMIN-PRENATAL) 27-0.8 MG TABS tablet Take 1 tablet by mouth daily at 12 noon.    [provider]  Vitamin D 12.5 MCG/0.25ML LIQD Take 3 mLs by mouth daily. For 12 weeks Patient not taking: Reported on 02/02/2020 03/15/18   Orma Flaming, MD    Allergies    Patient has no known allergies.  Review of Systems   Review of Systems  Constitutional: Negative for chills, fatigue and fever.  HENT: Negative for congestion, sore throat and trouble swallowing.   Respiratory: Positive for cough and shortness of breath. Negative for wheezing.   Cardiovascular: Positive for chest pain. Negative for palpitations and leg swelling.  Gastrointestinal: Positive for diarrhea, nausea and vomiting. Negative for abdominal pain.  Genitourinary: Negative for dysuria, flank pain, hematuria, pelvic pain, vaginal bleeding and vaginal discharge.  Musculoskeletal: Negative for arthralgias, back pain, myalgias, neck pain and neck stiffness.  Skin: Negative for rash.  Neurological: Negative for dizziness, weakness and numbness.  Hematological: Does not bruise/bleed easily.  Psychiatric/Behavioral: Negative for confusion.    Physical Exam Updated Vital Signs BP 118/68 (BP Location: Left Arm)   Pulse (!) 108   Temp 99.6 F (37.6 C) (Oral)   Resp (!) 30   Ht 5\' 3"  (1.6 m)   Wt 106 kg   LMP 09/25/2019   SpO2 98%   BMI 41.40 kg/m   Physical Exam Vitals and nursing note reviewed.  Constitutional:      Appearance: Normal appearance. She is not ill-appearing or toxic-appearing.  HENT:     Head: Atraumatic.     Mouth/Throat:     Mouth: Mucous membranes are moist.  Cardiovascular:     Rate and Rhythm: Normal rate and regular rhythm.     Pulses: Normal pulses.  Pulmonary:     Effort: Pulmonary effort is normal. No respiratory distress.     Breath sounds: Normal breath sounds. No wheezing or rhonchi.   Chest:     Chest wall: No tenderness.  Abdominal:     Tenderness: There is no abdominal tenderness. There is no guarding.     Comments: Abdomen is gravid  Musculoskeletal:     Cervical back: Normal range of motion.     Right lower leg: No edema.     Left lower leg: No edema.  Skin:    General: Skin is warm.     Capillary Refill: Capillary refill  takes less than 2 seconds.     Findings: No rash.  Neurological:     General: No focal deficit present.     Mental Status: She is alert.     Sensory: No sensory deficit.     Motor: No weakness.     ED Results / Procedures / Treatments   Labs (all labs ordered are listed, but only abnormal results are displayed) Labs Reviewed  BASIC METABOLIC PANEL - Abnormal; Notable for the following components:      Result Value   Sodium 131 (*)    Potassium 2.8 (*)    CO2 20 (*)    Glucose, Bld 114 (*)    BUN <5 (*)    Calcium 8.3 (*)    All other components within normal limits  CBC WITH DIFFERENTIAL/PLATELET - Abnormal; Notable for the following components:   RBC 3.57 (*)    Hemoglobin 10.7 (*)    HCT 30.2 (*)    Lymphs Abs 0.6 (*)    All other components within normal limits  MAGNESIUM    EKG EKG Interpretation  Date/Time:  Sunday February 12 2020 09:20:19 EST Ventricular Rate:  106 PR Interval:    QRS Duration: 84 QT Interval:  329 QTC Calculation: 437 R Axis:   71 Text Interpretation: Sinus tachycardia rate is faster compared to Dec 04 2019 Confirmed by Sherwood Gambler [phone]) on 02/12/2020 1:36:51 PM   Radiology DG Chest Portable 1 View  Result Date: 02/12/2020 CLINICAL DATA:  History of COVID-19.  Headache.  Chills. EXAM: PORTABLE CHEST 1 VIEW COMPARISON:  Chest radiograph 02/10/2020. FINDINGS: Stable cardiac and mediastinal contours. Interval development patchy consolidation right mid lung. No pleural effusion or pneumothorax. Thoracic spine degenerative changes. IMPRESSION: New focal consolidation right mid lung may  represent atelectasis or potentially pneumonia. Recommend further evaluation with PA and lateral chest radiograph. Electronically Signed   By: Lovey Newcomer M.D.   On: 02/12/2020 10:06    Procedures Procedures (including critical care time)  Medications Ordered in ED Medications - No data to display  ED Course  I have reviewed the triage vital signs and the nursing notes.  Pertinent labs & imaging results that were available during my care of the patient were reviewed by me and considered in my medical decision making (see chart for details).    MDM Rules/Calculators/A&P                          pt seen here 2 days ago and covid +  Returns today due to cough and shortness of breath from excessive coughing and exertion.  Here her O2 sats have remained in the upper nineties and she has no respiratory distress.  She has appt with OB on Monday.  Reports vomiting and diarrhea and she was mildly hypokalemic on prior visit.  Will shield abd and get CXR and repeat labs.    Potassium lower than earlier visit, will give IV and po K+ here.  Has rx potassium but has not gotten medication filled.  Remaining labs w/o significant findings.  CXR shows likely covid related PNA which is the likely reason for her reported dyspnea.  PE considered, but felt less likely given that she is not hypoxic or tachypneic and HR ~100.  She does not appear to be in respiratory distress.   No abdominal pain and both fetal HR's heard with doppler and in the 160's  Pt unable to void at this time.  Had urinalysis 2 days ago without evidence of infection.  She has been monitored during her ER stay w/o hypoxia and on recheck, she has tolerated oral fluids and oral potassium.  Given IVF's here, feeling better.  She agrees to keep appt with OB and strict return precautions discussed. Encouraged to take her potassium and advised that her level will need to be rechecked in 1-2 weeks.   DONIESHA LANDAU was evaluated in Emergency  Department on 02/12/2020 for the symptoms described in the history of present illness. She was evaluated in the context of the global COVID-19 pandemic, which necessitated consideration that the patient might be at risk for infection with the SARS-CoV-2 virus that causes COVID-19. Institutional protocols and algorithms that pertain to the evaluation of patients at risk for COVID-19 are in a state of rapid change based on information released by regulatory bodies including the CDC and federal and state organizations. These policies and algorithms were followed during the patient's care in the ED.  Final Clinical Impression(s) / ED Diagnoses Final diagnoses:  Pneumonia due to COVID-19 virus  Hypokalemia    Rx / DC Orders ED Discharge Orders    None       Kem Parkinson, PA-C 02/13/20 1445    Sherwood Gambler, MD 02/14/20 [phone]

## 2020-02-12 NOTE — ED Triage Notes (Signed)
Pt reports SOB that has gotten worse over the last day. Diagnosed with covid on 02/10/20. Pt reports CP and aches. Coughing up mucous. Pt is approx [redacted] weeks pregnant w twins and reports fetal movement

## 2020-02-12 NOTE — Discharge Instructions (Addendum)
Your chest x-ray today shows that you have pneumonia which is likely related to your COVID infection.  Your potassium level today was also low.  It is important that you take the prescription potassium daily as directed.  You may take 1 to 2 puffs of the albuterol inhaler every 4-6 hours as needed.  You will need to have your primary care provider recheck your potassium level in 1 to 2 weeks.  Be sure to [facility].  Continue to isolate at home as previously instructed.

## 2020-02-13 ENCOUNTER — Other Ambulatory Visit: Payer: Self-pay

## 2020-02-13 ENCOUNTER — Ambulatory Visit: Payer: Medicaid Other

## 2020-02-13 ENCOUNTER — Other Ambulatory Visit: Payer: Medicaid Other

## 2020-02-13 ENCOUNTER — Telehealth (INDEPENDENT_AMBULATORY_CARE_PROVIDER_SITE_OTHER): Payer: Medicaid Other | Admitting: Women's Health

## 2020-02-13 ENCOUNTER — Encounter: Payer: Self-pay | Admitting: Women's Health

## 2020-02-13 VITALS — BP 114/63 | HR 112

## 2020-02-13 DIAGNOSIS — O0992 Supervision of high risk pregnancy, unspecified, second trimester: Secondary | ICD-10-CM

## 2020-02-13 DIAGNOSIS — R160 Hepatomegaly, not elsewhere classified: Secondary | ICD-10-CM

## 2020-02-13 DIAGNOSIS — O99210 Obesity complicating pregnancy, unspecified trimester: Secondary | ICD-10-CM

## 2020-02-13 DIAGNOSIS — D219 Benign neoplasm of connective and other soft tissue, unspecified: Secondary | ICD-10-CM

## 2020-02-13 DIAGNOSIS — E0590 Thyrotoxicosis, unspecified without thyrotoxic crisis or storm: Secondary | ICD-10-CM

## 2020-02-13 DIAGNOSIS — E876 Hypokalemia: Secondary | ICD-10-CM | POA: Diagnosis not present

## 2020-02-13 DIAGNOSIS — O0990 Supervision of high risk pregnancy, unspecified, unspecified trimester: Secondary | ICD-10-CM | POA: Diagnosis not present

## 2020-02-13 DIAGNOSIS — U071 COVID-19: Secondary | ICD-10-CM

## 2020-02-13 DIAGNOSIS — O99280 Endocrine, nutritional and metabolic diseases complicating pregnancy, unspecified trimester: Secondary | ICD-10-CM

## 2020-02-13 DIAGNOSIS — O30042 Twin pregnancy, dichorionic/diamniotic, second trimester: Secondary | ICD-10-CM

## 2020-02-13 DIAGNOSIS — E0500 Thyrotoxicosis with diffuse goiter without thyrotoxic crisis or storm: Secondary | ICD-10-CM

## 2020-02-13 DIAGNOSIS — D573 Sickle-cell trait: Secondary | ICD-10-CM

## 2020-02-13 NOTE — Progress Notes (Signed)
TELEHEALTH VIRTUAL OBSTETRICS VISIT ENCOUNTER NOTE Patient name: Misty Murillo MRN [id number]  Date of birth: 1990-09-18  I connected with patient on 02/13/20 at  9:10 AM EST by phone (couldn't get myhcart to work) and verified that I am speaking with the correct person using two identifiers. Pt is not currently in our office, she is at home.  The provider is in the office.    I discussed the limitations, risks, security and privacy concerns of performing an evaluation and management service by telephone and the availability of in person appointments. I also discussed with the patient that there may be a patient responsible charge related to this service. The patient expressed understanding and agreed to proceed.  Chief Complaint:   Routine Prenatal Visit  History of Present Illness:   Misty Murillo is a 30 y.o. G26P1011 female at [redacted]w[redacted]d with an Estimated Date of Delivery: 07/01/20 being evaluated today for ongoing management of a high-risk pregnancy complicated by di-di twins, Graves disease, large liver mass-FNH. 1st visit w/ [place], has been getting care at [facility], but lives in [place], so switched to [place]. Has appt for anatomy u/s w/ MFM on 2/4.  Depression screen [facility] 2/9 12/19/2019 12/12/2019 07/21/2018 03/05/2018 05/18/2017  Decreased Interest 2 3 1 3 1   Down, Depressed, Hopeless 0 1 0 3 0  PHQ - 2 Score 2 4 1 6 1   Altered sleeping 0 3 0 3 -  Tired, decreased energy 2 3 3 3 2   Change in appetite 2 3 2 3  0  Feeling bad or failure about yourself  0 0 0 3 0  Trouble concentrating 0 0 0 0 0  Moving slowly or fidgety/restless 0 0 0 0 0  Suicidal thoughts 0 0 0 0 0  PHQ-9 Score 6 13 6 18  -  Difficult doing work/chores - - Not difficult at all Somewhat difficult -    Today she reports Covid+ 1/21, sx started 1/18. Went to [facility] on 1/21 and 1/23- dx w/ viral pneumonia last night. N/V, no appetite, has zofran rx- just took 42mins ago, has stayed down so far. Productive cough, some sob, no fever/chills.  Some upper back pain. Feeling both babies move. Contractions: Not present. Vag. Bleeding: None.  Movement: Present. denies leaking of fluid. Review of Systems:   Pertinent items are noted in HPI Denies abnormal vaginal discharge w/ itching/odor/irritation, headaches, visual changes, shortness of breath, chest pain, abdominal pain, severe nausea/vomiting, or problems with urination or bowel movements unless otherwise stated above. Pertinent History Reviewed:  Reviewed past medical,surgical, social, obstetrical and family history.  Reviewed problem list, medications and allergies. Physical Assessment:   Vitals:   02/13/20 0917  BP: 114/63  Pulse: (!) 112  There is no height or weight on file to calculate BMI.        Physical Examination:   General:  Alert, oriented and cooperative.   Mental Status: Normal mood and affect perceived. Normal judgment and thought content.  Rest of physical exam deferred due to type of encounter  No results found for this or any previous visit (from the past 24 hour(s)).  Assessment & Plan:  1) Pregnancy G3P1011 at [redacted]w[redacted]d with an Estimated Date of Delivery: 07/01/20   2) Di-Di twins, ASA, keep appt for anatomy u/s on 2/4 w/ MFM as scheduled. Will do future u/s at [facility]  3) Covid pneumonia> dx yesterday at [facility], sx started 1/18, +test 1/21. Covid treatment referral entered today. Not vaccinated. Reviewed home relief measures.  Reviewed warning s/s, reasons to seek care.  4) Large liver mass> last imaging July 2021, stable, c/w focal nodular hyperplasia  5) Graves disease> no meds last TSH 1/11 0.35   Meds: No orders of the defined types were placed in this encounter.   Labs/procedures today: none  Plan:  Continue routine obstetrical care.  Has home bp cuff.  Check bp weekly, let us know if >140/90.  Next visit: prefers will be in person for u/s    Reviewed: Preterm labor symptoms and general obstetric precautions including but not limited to vaginal  bleeding, contractions, leaking of fluid and fetal movement were reviewed in detail with the patient. The patient was advised to call back or seek an in-person office evaluation/go to MAU at [facility] for any urgent or concerning symptoms. All questions were answered. Please refer to After Visit Summary for other counseling recommendations.    I provided 20 minutes of non-face-to-face time during this encounter.  Follow-up: Return in about 4 weeks (around 03/12/2020) for HROB, [place], US:EFW, in person, MD or CNM.  Orders Placed This Encounter  Procedures  . US OB Follow Up  . US OB Follow Up AddL Gest  . Ambulatory referral for Covid Treatment   Roma Schanz CNM, [facility] 02/13/2020 11:28 AM

## 2020-02-13 NOTE — Patient Instructions (Signed)
Colds/Coughs/Allergies: Benadryl (alcohol free) 25 mg every 6 hours as needed Breath right strips Claritin Cepacol throat lozenges Chloraseptic throat spray Cold-Eeze- up to three times per day Cough drops, alcohol free Flonase (by prescription only) Guaifenesin Mucinex Robitussin DM (plain only, alcohol free) Saline nasal spray/drops Sudafed (pseudoephedrine) & Actifed ** use only after [redacted] weeks gestation and if you do not have high blood pressure Tylenol Vicks Vaporub Zinc lozenges Zyrtec

## 2020-02-14 ENCOUNTER — Telehealth (HOSPITAL_COMMUNITY): Payer: Self-pay

## 2020-02-14 ENCOUNTER — Inpatient Hospital Stay (HOSPITAL_COMMUNITY)
Admission: AD | Admit: 2020-02-14 | Discharge: 2020-02-22 | DRG: 831 | Disposition: A | Discharge status: Home / Self Care | Payer: Medicaid Other | Attending: Obstetrics and Gynecology | Admitting: Obstetrics and Gynecology

## 2020-02-14 ENCOUNTER — Other Ambulatory Visit: Payer: Self-pay

## 2020-02-14 ENCOUNTER — Encounter (HOSPITAL_COMMUNITY): Payer: Self-pay | Admitting: Obstetrics & Gynecology

## 2020-02-14 DIAGNOSIS — O30049 Twin pregnancy, dichorionic/diamniotic, unspecified trimester: Secondary | ICD-10-CM | POA: Diagnosis present

## 2020-02-14 DIAGNOSIS — A084 Viral intestinal infection, unspecified: Secondary | ICD-10-CM | POA: Diagnosis not present

## 2020-02-14 DIAGNOSIS — U071 COVID-19: Secondary | ICD-10-CM | POA: Diagnosis not present

## 2020-02-14 DIAGNOSIS — O99212 Obesity complicating pregnancy, second trimester: Secondary | ICD-10-CM | POA: Diagnosis present

## 2020-02-14 DIAGNOSIS — R112 Nausea with vomiting, unspecified: Secondary | ICD-10-CM | POA: Diagnosis present

## 2020-02-14 DIAGNOSIS — O99282 Endocrine, nutritional and metabolic diseases complicating pregnancy, second trimester: Secondary | ICD-10-CM | POA: Diagnosis present

## 2020-02-14 DIAGNOSIS — E876 Hypokalemia: Secondary | ICD-10-CM | POA: Diagnosis present

## 2020-02-14 DIAGNOSIS — O210 Mild hyperemesis gravidarum: Secondary | ICD-10-CM | POA: Diagnosis not present

## 2020-02-14 DIAGNOSIS — R7989 Other specified abnormal findings of blood chemistry: Secondary | ICD-10-CM

## 2020-02-14 DIAGNOSIS — O99891 Other specified diseases and conditions complicating pregnancy: Secondary | ICD-10-CM | POA: Diagnosis not present

## 2020-02-14 DIAGNOSIS — Z87891 Personal history of nicotine dependence: Secondary | ICD-10-CM

## 2020-02-14 DIAGNOSIS — Z3A21 21 weeks gestation of pregnancy: Secondary | ICD-10-CM | POA: Diagnosis not present

## 2020-02-14 DIAGNOSIS — R197 Diarrhea, unspecified: Secondary | ICD-10-CM | POA: Diagnosis present

## 2020-02-14 DIAGNOSIS — O219 Vomiting of pregnancy, unspecified: Secondary | ICD-10-CM | POA: Diagnosis not present

## 2020-02-14 DIAGNOSIS — Z3A20 20 weeks gestation of pregnancy: Secondary | ICD-10-CM

## 2020-02-14 DIAGNOSIS — O98512 Other viral diseases complicating pregnancy, second trimester: Principal | ICD-10-CM | POA: Diagnosis present

## 2020-02-14 DIAGNOSIS — O30042 Twin pregnancy, dichorionic/diamniotic, second trimester: Secondary | ICD-10-CM | POA: Diagnosis present

## 2020-02-14 DIAGNOSIS — D573 Sickle-cell trait: Secondary | ICD-10-CM | POA: Diagnosis not present

## 2020-02-14 DIAGNOSIS — O99012 Anemia complicating pregnancy, second trimester: Secondary | ICD-10-CM | POA: Diagnosis present

## 2020-02-14 DIAGNOSIS — A419 Sepsis, unspecified organism: Secondary | ICD-10-CM | POA: Diagnosis present

## 2020-02-14 DIAGNOSIS — R945 Abnormal results of liver function studies: Secondary | ICD-10-CM | POA: Diagnosis not present

## 2020-02-14 DIAGNOSIS — R0989 Other specified symptoms and signs involving the circulatory and respiratory systems: Secondary | ICD-10-CM | POA: Diagnosis present

## 2020-02-14 DIAGNOSIS — O26892 Other specified pregnancy related conditions, second trimester: Secondary | ICD-10-CM | POA: Diagnosis not present

## 2020-02-14 DIAGNOSIS — E669 Obesity, unspecified: Secondary | ICD-10-CM | POA: Diagnosis not present

## 2020-02-14 DIAGNOSIS — J1282 Pneumonia due to coronavirus disease 2019: Secondary | ICD-10-CM | POA: Diagnosis not present

## 2020-02-14 DIAGNOSIS — O99512 Diseases of the respiratory system complicating pregnancy, second trimester: Secondary | ICD-10-CM | POA: Diagnosis not present

## 2020-02-14 DIAGNOSIS — O99612 Diseases of the digestive system complicating pregnancy, second trimester: Secondary | ICD-10-CM | POA: Diagnosis present

## 2020-02-14 DIAGNOSIS — A4189 Other specified sepsis: Secondary | ICD-10-CM | POA: Diagnosis not present

## 2020-02-14 DIAGNOSIS — J9601 Acute respiratory failure with hypoxia: Secondary | ICD-10-CM | POA: Diagnosis present

## 2020-02-14 DIAGNOSIS — R000 Tachycardia, unspecified: Secondary | ICD-10-CM | POA: Diagnosis present

## 2020-02-14 DIAGNOSIS — R110 Nausea: Secondary | ICD-10-CM | POA: Diagnosis not present

## 2020-02-14 DIAGNOSIS — R1115 Cyclical vomiting syndrome unrelated to migraine: Secondary | ICD-10-CM | POA: Diagnosis not present

## 2020-02-14 LAB — URINALYSIS, ROUTINE W REFLEX MICROSCOPIC
Glucose, UA: NEGATIVE mg/dL
Hgb urine dipstick: NEGATIVE
Ketones, ur: NEGATIVE mg/dL
Leukocytes,Ua: NEGATIVE
Nitrite: NEGATIVE
Protein, ur: 30 mg/dL — AB
Specific Gravity, Urine: 1.016 (ref 1.005–1.030)
pH: 5 (ref 5.0–8.0)

## 2020-02-14 LAB — TYPE AND SCREEN
ABO/RH(D): O POS
Antibody Screen: NEGATIVE

## 2020-02-14 LAB — LACTATE DEHYDROGENASE: LDH: 265 U/L — ABNORMAL HIGH (ref 98–192)

## 2020-02-14 LAB — COMPREHENSIVE METABOLIC PANEL
ALT: 36 U/L (ref 0–44)
AST: 82 U/L — ABNORMAL HIGH (ref 15–41)
Albumin: 2.4 g/dL — ABNORMAL LOW (ref 3.5–5.0)
Alkaline Phosphatase: 93 U/L (ref 38–126)
Anion gap: 12 (ref 5–15)
BUN: <5 mg/dL — ABNORMAL LOW (ref 6–20)
CO2: 20 mmol/L — ABNORMAL LOW (ref 22–32)
Calcium: 8.5 mg/dL — ABNORMAL LOW (ref 8.9–10.3)
Chloride: 99 mmol/L (ref 98–111)
Creatinine, Ser: 0.61 mg/dL (ref 0.44–1.00)
GFR, Estimated: >60 mL/min (ref >60)
Glucose, Bld: 112 mg/dL — ABNORMAL HIGH (ref 70–99)
Potassium: 4.3 mmol/L (ref 3.5–5.1)
Sodium: 131 mmol/L — ABNORMAL LOW (ref 135–145)
Total Bilirubin: 2.2 mg/dL — ABNORMAL HIGH (ref 0.3–1.2)
Total Protein: 6.3 g/dL — ABNORMAL LOW (ref 6.5–8.1)

## 2020-02-14 LAB — FIBRINOGEN: Fibrinogen: 417 mg/dL (ref 210–475)

## 2020-02-14 LAB — TRIGLYCERIDES: Triglycerides: 197 mg/dL — ABNORMAL HIGH (ref <150)

## 2020-02-14 LAB — CBC
HCT: 29.6 % — ABNORMAL LOW (ref 36.0–46.0)
Hemoglobin: 10.2 g/dL — ABNORMAL LOW (ref 12.0–15.0)
MCH: 28.9 pg (ref 26.0–34.0)
MCHC: 34.5 g/dL (ref 30.0–36.0)
MCV: 83.9 fL (ref 80.0–100.0)
Platelets: 220 10*3/uL (ref 150–400)
RBC: 3.53 MIL/uL — ABNORMAL LOW (ref 3.87–5.11)
RDW: 13.5 % (ref 11.5–15.5)
WBC: 6.6 10*3/uL (ref 4.0–10.5)
nRBC: 0 % (ref 0.0–0.2)

## 2020-02-14 LAB — C-REACTIVE PROTEIN: CRP: 6.8 mg/dL — ABNORMAL HIGH (ref <1.0)

## 2020-02-14 LAB — FERRITIN: Ferritin: 150 ng/mL (ref 11–307)

## 2020-02-14 LAB — HIV ANTIBODY (ROUTINE TESTING W REFLEX): HIV Screen 4th Generation wRfx: NONREACTIVE

## 2020-02-14 LAB — LIPASE, BLOOD: Lipase: 21 U/L (ref 11–51)

## 2020-02-14 LAB — LACTIC ACID, PLASMA: Lactic Acid, Venous: 1.1 mmol/L (ref 0.5–1.9)

## 2020-02-14 LAB — PROCALCITONIN: Procalcitonin: 0.21 ng/mL

## 2020-02-14 LAB — D-DIMER, QUANTITATIVE: D-Dimer, Quant: 2.52 ug/mL-FEU — ABNORMAL HIGH (ref 0.00–0.50)

## 2020-02-14 MED ORDER — LOPERAMIDE HCL 2 MG PO CAPS: 2.0000 mg | ORAL_CAPSULE | ORAL | Status: DC | PRN

## 2020-02-14 MED ORDER — HYDROCOD POLST-CPM POLST ER 10-8 MG/5ML PO SUER: 5.0000 mL | Freq: Two times a day (BID) | ORAL | Status: DC | PRN

## 2020-02-14 MED ORDER — GUAIFENESIN-DM 100-10 MG/5ML PO SYRP
5.0000 mL | ORAL_SOLUTION | ORAL | Status: DC | PRN
  Administered 2020-02-14: 5 mL via ORAL
  Filled 2020-02-14: qty 5

## 2020-02-14 MED ORDER — ALBUTEROL SULFATE HFA 108 (90 BASE) MCG/ACT IN AERS
2.0000 | INHALATION_SPRAY | RESPIRATORY_TRACT | Status: DC | PRN
  Administered 2020-02-14 – 2020-02-18 (×6): 2 via RESPIRATORY_TRACT
  Filled 2020-02-14: qty 6.7

## 2020-02-14 MED ORDER — DEXAMETHASONE SODIUM PHOSPHATE 10 MG/ML IJ SOLN
6.0000 mg | INTRAMUSCULAR | Status: DC
  Administered 2020-02-14 – 2020-02-15 (×2): 6 mg via INTRAVENOUS
  Filled 2020-02-14 (×2): qty 1

## 2020-02-14 MED ORDER — CALCIUM CARBONATE ANTACID 500 MG PO CHEW
2.0000 | CHEWABLE_TABLET | ORAL | Status: DC | PRN
  Administered 2020-02-15 – 2020-02-21 (×3): 400 mg via ORAL
  Filled 2020-02-14 (×3): qty 2

## 2020-02-14 MED ORDER — LACTATED RINGERS IV SOLN: INTRAVENOUS | Status: DC

## 2020-02-14 MED ORDER — ASPIRIN EC 81 MG PO TBEC
81.0000 mg | DELAYED_RELEASE_TABLET | Freq: Every day | ORAL | Status: DC
  Administered 2020-02-15 – 2020-02-19 (×5): 81 mg via ORAL
  Filled 2020-02-14 (×5): qty 1

## 2020-02-14 MED ORDER — ACETAMINOPHEN 650 MG RE SUPP
650.0000 mg | Freq: Once | RECTAL | Status: AC
  Administered 2020-02-14: 650 mg via RECTAL
  Filled 2020-02-14: qty 1

## 2020-02-14 MED ORDER — ONDANSETRON HCL 4 MG PO TABS
4.0000 mg | ORAL_TABLET | Freq: Four times a day (QID) | ORAL | Status: DC
  Administered 2020-02-14 – 2020-02-15 (×4): 4 mg via ORAL
  Filled 2020-02-14 (×5): qty 1

## 2020-02-14 MED ORDER — ONDANSETRON HCL 4 MG/2ML IJ SOLN
4.0000 mg | Freq: Once | INTRAMUSCULAR | Status: AC
  Administered 2020-02-14: 4 mg via INTRAVENOUS
  Filled 2020-02-14: qty 2

## 2020-02-14 MED ORDER — PRENATAL MULTIVITAMIN CH
1.0000 | ORAL_TABLET | Freq: Every day | ORAL | Status: DC
  Administered 2020-02-15 – 2020-02-16 (×2): 1 via ORAL
  Filled 2020-02-14 (×3): qty 1

## 2020-02-14 MED ORDER — ACETAMINOPHEN 325 MG PO TABS
650.0000 mg | ORAL_TABLET | ORAL | Status: DC | PRN
  Administered 2020-02-17 – 2020-02-19 (×2): 650 mg via ORAL
  Filled 2020-02-14 (×4): qty 2

## 2020-02-14 MED ORDER — ENOXAPARIN SODIUM 120 MG/0.8ML ~~LOC~~ SOLN: 1.0000 mg/kg | Freq: Two times a day (BID) | SUBCUTANEOUS | Status: DC

## 2020-02-14 MED ORDER — ENOXAPARIN SODIUM 40 MG/0.4ML ~~LOC~~ SOLN
40.0000 mg | SUBCUTANEOUS | Status: DC
  Administered 2020-02-14: 40 mg via SUBCUTANEOUS
  Filled 2020-02-14: qty 0.4

## 2020-02-14 MED ORDER — SODIUM CHLORIDE 0.9 % IV SOLN
200.0000 mg | Freq: Once | INTRAVENOUS | Status: AC
  Administered 2020-02-14: 200 mg via INTRAVENOUS
  Filled 2020-02-14: qty 40

## 2020-02-14 MED ORDER — SODIUM CHLORIDE 0.9 % IV SOLN
100.0000 mg | Freq: Every day | INTRAVENOUS | Status: AC
  Administered 2020-02-15 – 2020-02-18 (×4): 100 mg via INTRAVENOUS
  Filled 2020-02-14 (×2): qty 100
  Filled 2020-02-14 (×2): qty 20

## 2020-02-14 MED ORDER — LACTATED RINGERS IV BOLUS
1000.0000 mL | Freq: Once | INTRAVENOUS | Status: AC
  Administered 2020-02-14: 1000 mL via INTRAVENOUS

## 2020-02-14 MED ORDER — FAMOTIDINE 20 MG PO TABS
20.0000 mg | ORAL_TABLET | Freq: Two times a day (BID) | ORAL | Status: DC
  Administered 2020-02-14 – 2020-02-19 (×10): 20 mg via ORAL
  Filled 2020-02-14 (×11): qty 1

## 2020-02-14 MED ORDER — DOCUSATE SODIUM 100 MG PO CAPS
100.0000 mg | ORAL_CAPSULE | Freq: Every day | ORAL | Status: DC
  Administered 2020-02-16 – 2020-02-19 (×4): 100 mg via ORAL
  Filled 2020-02-14 (×8): qty 1

## 2020-02-14 NOTE — MAU Note (Addendum)
Patient stated that she has been experiecing nausea/ vomiting/diarhea, and shortness of breath for about a week.she cant keep soild or liquids down for a week. With abd pain.  Denies vaginal bleeding lof and reports positive fetal movement.  Pt reports testing positive at [facility] on the 1/21 for Covid, returned to ED on 1/23 w/ worsening SOB, reports she was positive for pneumonia. No meds given.

## 2020-02-14 NOTE — Telephone Encounter (Signed)
Transition Care Management Follow-up Telephone Call  Date of discharge and from where: 02/12/20 from [facility]  How have you been since you were released from the hospital? Patient is feeling worse.Patient states that she is not able to keep food down. Patient states that she can not keep her medicine down and that she going to go the [facility] ED. Patient states that she is shaky and that it is difficult to breath.   Any questions or concerns? Yes  Items Reviewed:  Did the pt receive and understand the discharge instructions provided? Yes   Medications obtained and verified? Yes   Other? No   Any new allergies since your discharge? No   Dietary orders reviewed? Yes  Do you have support at home? Yes   Functional Questionnaire: (I = Independent and D = Dependent) ADLs: I  Bathing/Dressing- I  Meal Prep- I  Eating- I  Maintaining continence- I  Transferring/Ambulation- I  Managing Meds- I   Follow up appointments reviewed:   [facility] f/u appt confirmed? Yes  Scheduled to see [facility] on 02/24/2020 @ 12:45pm.  Are transportation arrangements needed? No   If their condition worsens, is the pt aware to call PCP or go to the Emergency Dept.? Yes  Was the patient provided with contact information for the PCP's office or ED? Yes  Was to pt encouraged to call back with questions or concerns? Yes

## 2020-02-14 NOTE — Plan of Care (Signed)
  Problem: Education: Goal: Knowledge of disease or condition will improve Outcome: Progressing Goal: Knowledge of the prescribed therapeutic regimen will improve Outcome: Progressing Goal: Individualized Educational Video(s) Outcome: Progressing   Problem: Clinical Measurements: Goal: Complications related to the disease process, condition or treatment will be avoided or minimized Outcome: Progressing   Problem: Education: Goal: Knowledge of risk factors and measures for prevention of condition will improve Outcome: Progressing   Problem: Coping: Goal: Psychosocial and spiritual needs will be supported Outcome: Progressing

## 2020-02-14 NOTE — Consult Note (Addendum)
Medical Consultation   Misty Murillo  WTU:[id number]  DOB: [birth year]  DOA: 02/14/2020  PCP: Orma Flaming, MD   Requesting physician: Dr. Elly Modena  Reason for consultation: COVID-19 viral pneumonia   History of Present Illness: Misty Murillo is an 30 y.o. female G69P1011 '@[redacted]w[redacted]d'  gestation with twins, Graves' disease, depression, anxiety, history of PID presented to MAU complaining of symptoms of an acute viral illness.  She tested positive for COVID-19 on 1/21 during ED visit and then returned on 1/23 and was diagnosed with Covid pneumonia.  On arrival to MAU today, patient was febrile, tachycardic, tachypneic, and SPO2 80% on room air when supine.  Not hypotensive.  WBC 6.6, hemoglobin 10.2 (stable compared to recent labs), platelet count 220K.  Sodium 131, potassium 4.3, chloride 99, bicarb 20, anion gap 12, BUN <5, creatinine 0.6, glucose 112.  AST 82, ALT 36, alk phos 93, T bili 2.2.  Lipase normal. She was given Tylenol, Zofran, and 1 L LR bolus.  Chest x-ray done 02/12/2020 showing new focal consolidation in the right midlung.  Patient is not vaccinated against Covid. She reports 1 week history of body aches, fatigue, cough, shortness of breath, nausea, vomiting, and diarrhea. States she is not able to tolerate any p.o. intake. She is also having discomfort in her lower abdomen. States she has Graves' disease for which she is seen by endocrinologist Dr. Dwyane Dee and currently not on any treatments as her thyroid function labs were normal during recent office visit and her endocrinologist told her he will continue to monitor her labs periodically. No other complaints.  Review of Systems:   All systems reviewed and apart from history of presenting illness, are negative.  Past Medical History: Past Medical History:  Diagnosis Date  . Anxiety   . Depression   . Fibroid   . Graves disease   . Graves disease   . Headache   . Liver mass   . Migraines   . PID (acute pelvic  inflammatory disease) 07/2019    Past Surgical History: Past Surgical History:  Procedure Laterality Date  . CHOLECYSTECTOMY    . RESECTION LIVER       Allergies:  No Known Allergies   Social History:  reports that she quit smoking about 2 years ago. Her smoking use included cigars. She smoked 0.00 packs per day. She has never used smokeless tobacco. She reports previous alcohol use. She reports previous drug use. Drug: Marijuana.   Family History: Family History  Problem Relation Age of Onset  . Diabetes Mother   . Hyperthyroidism Mother   . Migraines Father   . Cancer Other   . Hyperthyroidism Brother   . Hypertension Brother   . Cancer Maternal Grandmother   . Cancer Maternal Grandfather       Physical Exam: Vitals:   02/14/20 1740 02/14/20 1745 02/14/20 1750 02/14/20 1922  BP:      Pulse:      Resp:      Temp:    99.7 F (37.6 C)  TempSrc:    Oral  SpO2: 92% 92% 92%     Physical Exam Constitutional:      General: She is not in acute distress.    Comments: Appears lethargic  HENT:     Head: Normocephalic and atraumatic.     Mouth/Throat:     Mouth: Mucous membranes are dry.  Eyes:     Extraocular  Movements: Extraocular movements intact.     Conjunctiva/sclera: Conjunctivae normal.  Cardiovascular:     Rate and Rhythm: Regular rhythm. Tachycardia present.     Pulses: Normal pulses.  Pulmonary:     Effort: Pulmonary effort is normal. No respiratory distress.     Breath sounds: No wheezing.  Abdominal:     General: Bowel sounds are normal.     Palpations: Abdomen is soft.     Tenderness: There is abdominal tenderness.     Comments: Lower quadrants tender to palpation  Musculoskeletal:        General: No swelling or tenderness.     Cervical back: Normal range of motion and neck supple.  Skin:    General: Skin is warm and dry.  Neurological:     General: No focal deficit present.     Mental Status: She is alert and oriented to person, place,  and time.      Data reviewed:  I have personally reviewed the recent labs and imaging studies  Pertinent Labs:      Inpatient Medications:   Scheduled Meds: . aspirin EC  81 mg Oral Daily  . dexamethasone (DECADRON) injection  6 mg Intravenous Q24H  . [START ON 02/15/2020] docusate sodium  100 mg Oral Daily  . enoxaparin (LOVENOX) injection  40 mg Subcutaneous Q24H  . famotidine  20 mg Oral BID  . ondansetron  4 mg Oral Q6H  . [START ON 02/15/2020] prenatal multivitamin  1 tablet Oral Q1200   Continuous Infusions: . lactated ringers    . remdesivir 200 mg in sodium chloride 0.9% 250 mL IVPB     Followed by  . [START ON 02/15/2020] remdesivir 100 mg in NS 100 mL       Radiological Exams on Admission: No results found.  Impression/Recommendations Principal Problem:   Pneumonia due to COVID-19 virus Active Problems:   Sepsis ([place])   Acute hypoxemic respiratory failure (HCC)   Intractable nausea and vomiting   Diarrhea  Sepsis and acute hypoxemic respiratory failure secondary to COVID-19 viral pneumonia: Meets criteria for sepsis -3 SIRS (fever, tachycardia, tachypnea) and SARS-CoV-2 PCR test positive on 02/10/2020.  No hypotension to suggest severe sepsis.  Chest x-ray done 02/12/2020 showing new focal consolidation in the right midlung.  Her oxygen saturation previously dropped to 80% on room air at MAU when supine.  At present, satting 91% on room air, sats improved to mid 90s with 2 L supplemental oxygen.  No tachypnea or increased work of breathing. -Remdesivir -IV Decadron 6 mg daily -Tussionex as needed for cough -Tylenol as needed for fevers -Bronchodilator as needed -Check ferritin, fibrinogen, D-dimer, CRP, LDH, triglycerides -Check procalcitonin level -Daily CBC with differential, CMP, CRP, D-dimer -Airborne and contact precautions -Incentive spirometry, flutter valve -Continuous pulse ox -Supplemental oxygen as needed to keep oxygen saturation above  92% -Blood culture x2 ordered -Lovenox for DVT prophylaxis  Addendum: D-dimer elevated at 2.52, suspect this is due to COVID-19 infection. PE is a consideration given tachycardia but her heart rate has now come down with IV fluids. Satting well on 2 L supplemental oxygen with no tachypnea or increased work of breathing. As mentioned previously, does have evidence of pneumonia on imaging. Continue to trend D-dimer level, if it trends up, then consider imaging to rule out PE. For now, start unfractionated heparin. Will order bilateral lower extremity Dopplers to rule out DVT.  Intractable nausea and vomiting Diarrhea Suspect symptoms are due to COVID-19 viral gastroenteritis.  AST 82,  ALT 36, alk phos 93, T bili 2.2.  Lipase normal.  Mild elevation of LFTs likely due to COVID-19 viral infection.  She is endorsing lower abdominal pain, discussed with Dr. Elly Modena from [facility] - she will examine the patient but does not feel that there are any pregnancy related issues at this time.  No leukocytosis on labs.  Patient denies recent antibiotic use. -IV fluid hydration, Zofran for nausea/vomiting, loperamide as needed for diarrhea.  Continue to monitor LFTs daily, if no improvement, obtain right upper quadrant ultrasound for evaluation of hepatobiliary system.  Mild hyponatremia: Likely due to poor oral intake/vomiting/diarrhea.  Sodium 131. -IV fluid hydration, continue to monitor sodium level  Mild normal anion gap metabolic acidosis: Likely due to diarrhea.  Bicarb 20, anion gap 12. -IV fluid hydration, continue to monitor  Tachycardia: TSH and free T4 checked on 01/31/2020 were normal. -Cardiac monitoring.  Order EKG.  History of Graves' disease: Patient is followed by endocrinology and recent thyroid function tests were normal. -Outpatient endocrinology follow-up  Pregnancy (68w2dgestation with twins) -Management per primary team.  Thank you for this consultation.  [facility] will  follow the patient with you.  Time Spent: 60 minutes.  VShela LeffM.D. [facility] 02/14/2020, 8:50 PM

## 2020-02-14 NOTE — MAU Provider Note (Signed)
History     CSN: [id number]  Arrival date and time: 02/14/20 1448   None     Chief Complaint  Patient presents with  . Nausea  . Shortness of Breath    Patient stated symptoms stated a week ago. Had mild nausea throughout pregnancy. Pt reports she can't keep liquids or solids down in over a week.    The history is provided by the patient.    Misty Murillo is a 30 y.o. G3P1011 @[redacted]w[redacted]d  gestation with twins. She presents to MAU with c/o nausea/vomiting/diarrhea, fever, congestion, dizziness, fatigue and all over body aches. She tested positive for Covid on 1/21 in the ED at [facility] and then returned on 1/23 and was dx with Covid pneumonia. She reports being unable to keep any food or drink down in the past week. She reports her symptoms are worse today and she is short of breath.   OB History    Gravida  3   Para  1   Term  1   Preterm  0   AB  1   Living  1     SAB  0   IAB  1   Ectopic  0   Multiple  0   Live Births  1           Past Medical History:  Diagnosis Date  . Anxiety   . Depression   . Fibroid   . Graves disease   . Graves disease   . Headache   . Liver mass   . Migraines   . PID (acute pelvic inflammatory disease) 07/2019    Past Surgical History:  Procedure Laterality Date  . CHOLECYSTECTOMY    . RESECTION LIVER      Family History  Problem Relation Age of Onset  . Diabetes Mother   . Hyperthyroidism Mother   . Migraines Father   . Cancer Other   . Hyperthyroidism Brother   . Hypertension Brother   . Cancer Maternal Grandmother   . Cancer Maternal Grandfather     Social History   Tobacco Use  . Smoking status: Former Smoker    Packs/day: 0.00    Types: Cigars    Quit date: 01/27/2018    Years since quitting: 2.0  . Smokeless tobacco: Never Used  Vaping Use  . Vaping Use: Never used  Substance Use Topics  . Alcohol use: Not Currently    Comment: occasionally  . Drug use: Not Currently    Types: Marijuana    Comment: last  used mid October    Allergies: No Known Allergies  Medications Prior to Admission  Medication Sig Dispense Refill Last Dose  . aspirin EC 81 MG tablet Take 1 tablet (81 mg total) by mouth daily. Take after 12 weeks for prevention of preeclampsia later in pregnancy 300 tablet 2   . Blood Pressure Monitoring DEVI 1 each by Does not apply route once a week. 1 each 0   . Elastic Bandages & Supports (COMFORT FIT MATERNITY SUPP LG) MISC 1 application by Does not apply route daily. 1 each 0   . famotidine (PEPCID) 20 MG tablet Take 1 tablet (20 mg total) by mouth 2 (two) times daily. 30 tablet 3   . fluticasone (FLONASE) 50 MCG/ACT nasal spray Place 2 sprays into both nostrils daily. (Patient not taking: Reported on 02/02/2020) 16 g 6   . ondansetron (ZOFRAN) 4 MG tablet Take 1 tablet (4 mg total) by mouth every 6 (six) hours.  20 tablet 0   . potassium chloride SA (KLOR-CON) 20 MEQ tablet Take 1 tablet (20 mEq total) by mouth 2 (two) times daily. 10 tablet 0   . Prenatal Vit-Fe Fumarate-FA (MULTIVITAMIN-PRENATAL) 27-0.8 MG TABS tablet Take 1 tablet by mouth daily at 12 noon.     . Vitamin D 12.5 MCG/0.25ML LIQD Take 3 mLs by mouth daily. For 12 weeks (Patient not taking: Reported on 02/02/2020) 84 mL 3     Review of Systems  Constitutional: Positive for appetite change, fatigue and fever. Negative for chills.  HENT: Positive for congestion, ear pain, sinus pressure, sinus pain and sore throat. Negative for dental problem, drooling, facial swelling, hearing loss and nosebleeds.   Eyes: Negative for photophobia, pain, discharge, redness, itching and visual disturbance.  Respiratory: Positive for cough, chest tightness, shortness of breath and wheezing.   Cardiovascular: Positive for chest pain. Negative for leg swelling.  Gastrointestinal: Positive for abdominal pain, diarrhea, nausea and vomiting.  Genitourinary: Positive for decreased urine volume and dysuria. Negative for frequency, vaginal bleeding  and vaginal discharge.  Musculoskeletal: Positive for back pain, myalgias and neck pain.  Skin: Negative for rash and wound.  Neurological: Positive for dizziness, light-headedness and headaches.  Psychiatric/Behavioral: Negative for confusion. The patient is not nervous/anxious.    Physical Exam   Blood pressure 116/70, pulse (!) 127, temperature (!) 101 F (38.3 C), resp. rate (!) 22, last menstrual period 09/25/2019, SpO2 92 %, unknown if currently breastfeeding.  Physical Exam Vitals and nursing note reviewed.  Constitutional:      General: She is not in acute distress.    Appearance: She is well-developed and well-nourished.  HENT:     Head: Normocephalic and atraumatic.     Mouth/Throat:     Mouth: Mucous membranes are dry.     Pharynx: No oropharyngeal exudate.  Eyes:     Extraocular Movements: Extraocular movements intact and EOM normal.  Neck:     Vascular: No JVD.  Cardiovascular:     Rate and Rhythm: Tachycardia present.  Pulmonary:     Effort: Accessory muscle usage present.     Breath sounds: Decreased breath sounds present. Rales: occasional rales, right.     Comments: On arrival to MAU O2 Sat 88% on R/A. Changed patient to sitting position and increased to 90%.  Abdominal:     General: Bowel sounds are normal.     Tenderness: There is abdominal tenderness in the left upper quadrant and left lower quadrant. There is right CVA tenderness. There is no left CVA tenderness or rebound.     Comments: Gravid with twin gestation. Tender left side of abdomen with palpation.   Genitourinary:    Vagina: Normal.  Musculoskeletal:        General: No swelling. Normal range of motion.     Cervical back: No rigidity.     Right ankle: No swelling.     Left ankle: No swelling.     Comments: Generalized body aches and tenderness on palpation.   Skin:    General: Skin is warm and dry.  Neurological:     Mental Status: She is alert and oriented to person, place, and time.   Psychiatric:        Mood and Affect: Mood is anxious.        Thought Content: Thought content normal.        Judgment: Judgment normal.   FHT: Baby A 192          Baby  B 182 Results for orders placed or performed during the hospital encounter of 02/14/20 (from the past 24 hour(s))  Urinalysis, Routine w reflex microscopic Urine, Clean Catch     Status: Abnormal   Collection Time: 02/14/20  4:39 PM  Result Value Ref Range   Color, Urine AMBER (A) YELLOW   APPearance CLOUDY (A) CLEAR   Specific Gravity, Urine 1.016 1.005 - 1.030   pH 5.0 5.0 - 8.0   Glucose, UA NEGATIVE NEGATIVE mg/dL   Hgb urine dipstick NEGATIVE NEGATIVE   Bilirubin Urine SMALL (A) NEGATIVE   Ketones, ur NEGATIVE NEGATIVE mg/dL   Protein, ur 30 (A) NEGATIVE mg/dL   Nitrite NEGATIVE NEGATIVE   Leukocytes,Ua NEGATIVE NEGATIVE   RBC / HPF 0-5 0 - 5 RBC/hpf   WBC, UA 11-20 0 - 5 WBC/hpf   Bacteria, UA RARE (A) NONE SEEN   Squamous Epithelial / LPF 11-20 0 - 5   Mucus PRESENT    Hyaline Casts, UA PRESENT   Type and screen [place]     Status: None   Collection Time: 02/14/20  4:50 PM  Result Value Ref Range   ABO/RH(D) O POS    Antibody Screen NEG    Sample Expiration      02/17/2020,2359 Performed at [facility], [address] [place], [place] [address]   CBC     Status: Abnormal   Collection Time: 02/14/20  4:55 PM  Result Value Ref Range   WBC 6.6 4.0 - 10.5 K/uL   RBC 3.53 (L) 3.87 - 5.11 MIL/uL   Hemoglobin 10.2 (L) 12.0 - 15.0 g/dL   HCT 29.6 (L) 36.0 - 46.0 %   MCV 83.9 80.0 - 100.0 fL   MCH 28.9 26.0 - 34.0 pg   MCHC 34.5 30.0 - 36.0 g/dL   RDW 13.5 11.5 - 15.5 %   Platelets 220 150 - 400 K/uL   nRBC 0.0 0.0 - 0.2 %  Comprehensive metabolic panel     Status: Abnormal   Collection Time: 02/14/20  4:55 PM  Result Value Ref Range   Sodium 131 (L) 135 - 145 mmol/L   Potassium 4.3 3.5 - 5.1 mmol/L   Chloride 99 98 - 111 mmol/L   CO2 20 (L) 22 - 32 mmol/L   Glucose,  Bld 112 (H) 70 - 99 mg/dL   BUN <5 (L) 6 - 20 mg/dL   Creatinine, Ser 0.61 0.44 - 1.00 mg/dL   Calcium 8.5 (L) 8.9 - 10.3 mg/dL   Total Protein 6.3 (L) 6.5 - 8.1 g/dL   Albumin 2.4 (L) 3.5 - 5.0 g/dL   AST 82 (H) 15 - 41 U/L   ALT 36 0 - 44 U/L   Alkaline Phosphatase 93 38 - 126 U/L   Total Bilirubin 2.2 (H) 0.3 - 1.2 mg/dL   GFR, Estimated >60 >60 mL/min   Anion gap 12 5 - 15  Lipase, blood     Status: None   Collection Time: 02/14/20  4:55 PM  Result Value Ref Range   Lipase 21 11 - 51 U/L    MAU Course:   Procedures 1. Nausea vomiting and diarrhea  2. Pneumonia due to COVID-19 virus IV hydration, Zofran Admit   [facility] 02/14/2020, 7:05 PM

## 2020-02-14 NOTE — H&P (Signed)
History   CSN: [id number]  Arrival date and time: 02/14/20 1448   None         Chief Complaint  Patient presents with  . Nausea  . Shortness of Breath    Patient stated symptoms stated a week ago. Had mild nausea throughout pregnancy. Pt reports she can't keep liquids or solids down in over a week.    The history is provided by the patient.    Misty Murillo is a 30 y.o. G3P1011 @[redacted]w[redacted]d  gestation with twins. She presents to MAU with c/o nausea/vomiting/diarrhea, fever, congestion, dizziness, fatigue and all over body aches. She tested positive for Covid on 1/21 in the ED at [facility] and then returned on 1/23 and was dx with Covid pneumonia. She reports being unable to keep any food or drink down in the past week. She reports her symptoms are worse today and she is short of breath.           OB History    Gravida  3   Para  1   Term  1   Preterm  0   AB  1   Living  1     SAB  0   IAB  1   Ectopic  0   Multiple  0   Live Births  1               Past Medical History:  Diagnosis Date  . Anxiety   . Depression   . Fibroid   . Graves disease   . Graves disease   . Headache   . Liver mass   . Migraines   . PID (acute pelvic inflammatory disease) 07/2019         Past Surgical History:  Procedure Laterality Date  . CHOLECYSTECTOMY    . RESECTION LIVER           Family History  Problem Relation Age of Onset  . Diabetes Mother   . Hyperthyroidism Mother   . Migraines Father   . Cancer Other   . Hyperthyroidism Brother   . Hypertension Brother   . Cancer Maternal Grandmother   . Cancer Maternal Grandfather     Social History        Tobacco Use  . Smoking status: Former Smoker    Packs/day: 0.00    Types: Cigars    Quit date: 01/27/2018    Years since quitting: 2.0  . Smokeless tobacco: Never Used  Vaping Use  . Vaping Use: Never used  Substance Use Topics  . Alcohol use: Not Currently     Comment: occasionally  . Drug use: Not Currently    Types: Marijuana    Comment: last used mid October    Allergies: No Known Allergies         Medications Prior to Admission  Medication Sig Dispense Refill Last Dose  . aspirin EC 81 MG tablet Take 1 tablet (81 mg total) by mouth daily. Take after 12 weeks for prevention of preeclampsia later in pregnancy 300 tablet 2   . Blood Pressure Monitoring DEVI 1 each by Does not apply route once a week. 1 each 0   . Elastic Bandages & Supports (COMFORT FIT MATERNITY SUPP LG) MISC 1 application by Does not apply route daily. 1 each 0   . famotidine (PEPCID) 20 MG tablet Take 1 tablet (20 mg total) by mouth 2 (two) times daily. 30 tablet 3   . fluticasone (FLONASE) 50 MCG/ACT nasal spray  Place 2 sprays into both nostrils daily. (Patient not taking: Reported on 02/02/2020) 16 g 6   . ondansetron (ZOFRAN) 4 MG tablet Take 1 tablet (4 mg total) by mouth every 6 (six) hours. 20 tablet 0   . potassium chloride SA (KLOR-CON) 20 MEQ tablet Take 1 tablet (20 mEq total) by mouth 2 (two) times daily. 10 tablet 0   . Prenatal Vit-Fe Fumarate-FA (MULTIVITAMIN-PRENATAL) 27-0.8 MG TABS tablet Take 1 tablet by mouth daily at 12 noon.     . Vitamin D 12.5 MCG/0.25ML LIQD Take 3 mLs by mouth daily. For 12 weeks (Patient not taking: Reported on 02/02/2020) 84 mL 3     Review of Systems  Constitutional: Positive for appetite change, fatigue and fever. Negative for chills.  HENT: Positive for congestion, ear pain, sinus pressure, sinus pain and sore throat. Negative for dental problem, drooling, facial swelling, hearing loss and nosebleeds.   Eyes: Negative for photophobia, pain, discharge, redness, itching and visual disturbance.  Respiratory: Positive for cough, chest tightness, shortness of breath and wheezing.   Cardiovascular: Positive for chest pain. Negative for leg swelling.  Gastrointestinal: Positive for abdominal pain, diarrhea, nausea and  vomiting.  Genitourinary: Positive for decreased urine volume and dysuria. Negative for frequency, vaginal bleeding and vaginal discharge.  Musculoskeletal: Positive for back pain, myalgias and neck pain.  Skin: Negative for rash and wound.  Neurological: Positive for dizziness, light-headedness and headaches.  Psychiatric/Behavioral: Negative for confusion. The patient is not nervous/anxious.    Physical Exam   Blood pressure 116/70, pulse (!) 127, temperature (!) 101 F (38.3 C), resp. rate (!) 22, last menstrual period 09/25/2019, SpO2 92 %, unknown if currently breastfeeding.  Physical Exam Vitals and nursing note reviewed.  Constitutional:      General: She is not in acute distress.    Appearance: She is well-developed and well-nourished.  HENT:     Head: Normocephalic and atraumatic.     Mouth/Throat:     Mouth: Mucous membranes are dry.     Pharynx: No oropharyngeal exudate.  Eyes:     Extraocular Movements: Extraocular movements intact and EOM normal.  Neck:     Vascular: No JVD.  Cardiovascular:     Rate and Rhythm: Tachycardia present.  Pulmonary:     Effort: Accessory muscle usage present.     Breath sounds: Decreased breath sounds present. Rales: occasional rales, right.     Comments: On arrival to MAU O2 Sat 88% on R/A. Changed patient to sitting position and increased to 90%.  Abdominal:     General: Bowel sounds are normal.     Tenderness: There is abdominal tenderness in the left upper quadrant and left lower quadrant. There is right CVA tenderness. There is no left CVA tenderness or rebound.     Comments: Gravid with twin gestation. Tender left side of abdomen with palpation.   Genitourinary:    Vagina: Normal.  Musculoskeletal:        General: No swelling. Normal range of motion.     Cervical back: No rigidity.     Right ankle: No swelling.     Left ankle: No swelling.     Comments: Generalized body aches and tenderness on palpation.   Skin:     General: Skin is warm and dry.  Neurological:     Mental Status: She is alert and oriented to person, place, and time.  Psychiatric:        Mood and Affect: Mood is anxious.  Thought Content: Thought content normal.        Judgment: Judgment normal.   FHT: Baby A 192          [name] 182 Lab Results Last 24 Hours        Results for orders placed or performed during the hospital encounter of 02/14/20 (from the past 24 hour(s))  Urinalysis, Routine w reflex microscopic Urine, Clean Catch     Status: Abnormal   Collection Time: 02/14/20  4:39 PM  Result Value Ref Range   Color, Urine AMBER (A) YELLOW   APPearance CLOUDY (A) CLEAR   Specific Gravity, Urine 1.016 1.005 - 1.030   pH 5.0 5.0 - 8.0   Glucose, UA NEGATIVE NEGATIVE mg/dL   Hgb urine dipstick NEGATIVE NEGATIVE   Bilirubin Urine SMALL (A) NEGATIVE   Ketones, ur NEGATIVE NEGATIVE mg/dL   Protein, ur 30 (A) NEGATIVE mg/dL   Nitrite NEGATIVE NEGATIVE   Leukocytes,Ua NEGATIVE NEGATIVE   RBC / HPF 0-5 0 - 5 RBC/hpf   WBC, UA 11-20 0 - 5 WBC/hpf   Bacteria, UA RARE (A) NONE SEEN   Squamous Epithelial / LPF 11-20 0 - 5   Mucus PRESENT    Hyaline Casts, UA PRESENT   Type and screen [place]     Status: None   Collection Time: 02/14/20  4:50 PM  Result Value Ref Range   ABO/RH(D) O POS    Antibody Screen NEG    Sample Expiration      02/17/2020,2359 Performed at [facility], [address] [place], [place] [address]   CBC     Status: Abnormal   Collection Time: 02/14/20  4:55 PM  Result Value Ref Range   WBC 6.6 4.0 - 10.5 K/uL   RBC 3.53 (L) 3.87 - 5.11 MIL/uL   Hemoglobin 10.2 (L) 12.0 - 15.0 g/dL   HCT 29.6 (L) 36.0 - 46.0 %   MCV 83.9 80.0 - 100.0 fL   MCH 28.9 26.0 - 34.0 pg   MCHC 34.5 30.0 - 36.0 g/dL   RDW 13.5 11.5 - 15.5 %   Platelets 220 150 - 400 K/uL   nRBC 0.0 0.0 - 0.2 %  Comprehensive metabolic panel     Status: Abnormal    Collection Time: 02/14/20  4:55 PM  Result Value Ref Range   Sodium 131 (L) 135 - 145 mmol/L   Potassium 4.3 3.5 - 5.1 mmol/L   Chloride 99 98 - 111 mmol/L   CO2 20 (L) 22 - 32 mmol/L   Glucose, Bld 112 (H) 70 - 99 mg/dL   BUN <5 (L) 6 - 20 mg/dL   Creatinine, Ser 0.61 0.44 - 1.00 mg/dL   Calcium 8.5 (L) 8.9 - 10.3 mg/dL   Total Protein 6.3 (L) 6.5 - 8.1 g/dL   Albumin 2.4 (L) 3.5 - 5.0 g/dL   AST 82 (H) 15 - 41 U/L   ALT 36 0 - 44 U/L   Alkaline Phosphatase 93 38 - 126 U/L   Total Bilirubin 2.2 (H) 0.3 - 1.2 mg/dL   GFR, Estimated >60 >60 mL/min   Anion gap 12 5 - 15  Lipase, blood     Status: None   Collection Time: 02/14/20  4:55 PM  Result Value Ref Range   Lipase 21 11 - 51 U/L      MAU Course:   Procedures 1. Nausea vomiting and diarrhea  2. Pneumonia due to COVID-19 virus IV hydration,  Zofran Admit   [facility] 02/14/2020, 7:05 PM

## 2020-02-15 ENCOUNTER — Inpatient Hospital Stay (HOSPITAL_COMMUNITY): Payer: Medicaid Other

## 2020-02-15 DIAGNOSIS — O26892 Other specified pregnancy related conditions, second trimester: Secondary | ICD-10-CM

## 2020-02-15 DIAGNOSIS — Z3A20 20 weeks gestation of pregnancy: Secondary | ICD-10-CM

## 2020-02-15 DIAGNOSIS — A419 Sepsis, unspecified organism: Secondary | ICD-10-CM

## 2020-02-15 DIAGNOSIS — O99512 Diseases of the respiratory system complicating pregnancy, second trimester: Secondary | ICD-10-CM

## 2020-02-15 DIAGNOSIS — R110 Nausea: Secondary | ICD-10-CM

## 2020-02-15 DIAGNOSIS — R0989 Other specified symptoms and signs involving the circulatory and respiratory systems: Secondary | ICD-10-CM | POA: Diagnosis present

## 2020-02-15 DIAGNOSIS — U071 COVID-19: Secondary | ICD-10-CM

## 2020-02-15 DIAGNOSIS — R197 Diarrhea, unspecified: Secondary | ICD-10-CM

## 2020-02-15 DIAGNOSIS — O219 Vomiting of pregnancy, unspecified: Secondary | ICD-10-CM

## 2020-02-15 DIAGNOSIS — J9601 Acute respiratory failure with hypoxia: Secondary | ICD-10-CM

## 2020-02-15 DIAGNOSIS — R7989 Other specified abnormal findings of blood chemistry: Secondary | ICD-10-CM

## 2020-02-15 DIAGNOSIS — Z3A21 21 weeks gestation of pregnancy: Secondary | ICD-10-CM

## 2020-02-15 DIAGNOSIS — O98512 Other viral diseases complicating pregnancy, second trimester: Principal | ICD-10-CM

## 2020-02-15 DIAGNOSIS — O30042 Twin pregnancy, dichorionic/diamniotic, second trimester: Secondary | ICD-10-CM

## 2020-02-15 LAB — COMPREHENSIVE METABOLIC PANEL
ALT: 28 U/L (ref 0–44)
AST: 43 U/L — ABNORMAL HIGH (ref 15–41)
Albumin: 2.1 g/dL — ABNORMAL LOW (ref 3.5–5.0)
Alkaline Phosphatase: 81 U/L (ref 38–126)
Anion gap: 12 (ref 5–15)
BUN: <5 mg/dL — ABNORMAL LOW (ref 6–20)
CO2: 21 mmol/L — ABNORMAL LOW (ref 22–32)
Calcium: 8.3 mg/dL — ABNORMAL LOW (ref 8.9–10.3)
Chloride: 102 mmol/L (ref 98–111)
Creatinine, Ser: 0.53 mg/dL (ref 0.44–1.00)
GFR, Estimated: >60 mL/min (ref >60)
Glucose, Bld: 109 mg/dL — ABNORMAL HIGH (ref 70–99)
Potassium: 2.7 mmol/L — CL (ref 3.5–5.1)
Sodium: 135 mmol/L (ref 135–145)
Total Bilirubin: 1.3 mg/dL — ABNORMAL HIGH (ref 0.3–1.2)
Total Protein: 5.7 g/dL — ABNORMAL LOW (ref 6.5–8.1)

## 2020-02-15 LAB — CBC WITH DIFFERENTIAL/PLATELET
Abs Immature Granulocytes: 0.19 10*3/uL — ABNORMAL HIGH (ref 0.00–0.07)
Basophils Absolute: 0 10*3/uL (ref 0.0–0.1)
Basophils Relative: 0 %
Eosinophils Absolute: 0 10*3/uL (ref 0.0–0.5)
Eosinophils Relative: 0 %
HCT: 25.5 % — ABNORMAL LOW (ref 36.0–46.0)
Hemoglobin: 8.7 g/dL — ABNORMAL LOW (ref 12.0–15.0)
Immature Granulocytes: 3 %
Lymphocytes Relative: 11 %
Lymphs Abs: 0.6 10*3/uL — ABNORMAL LOW (ref 0.7–4.0)
MCH: 28.5 pg (ref 26.0–34.0)
MCHC: 34.1 g/dL (ref 30.0–36.0)
MCV: 83.6 fL (ref 80.0–100.0)
Monocytes Absolute: 0.3 10*3/uL (ref 0.1–1.0)
Monocytes Relative: 5 %
Neutro Abs: 4.5 10*3/uL (ref 1.7–7.7)
Neutrophils Relative %: 81 %
Platelets: 197 10*3/uL (ref 150–400)
RBC: 3.05 MIL/uL — ABNORMAL LOW (ref 3.87–5.11)
RDW: 13.6 % (ref 11.5–15.5)
WBC: 5.6 10*3/uL (ref 4.0–10.5)
nRBC: 0 % (ref 0.0–0.2)

## 2020-02-15 LAB — D-DIMER, QUANTITATIVE: D-Dimer, Quant: 1.72 ug/mL-FEU — ABNORMAL HIGH (ref 0.00–0.50)

## 2020-02-15 LAB — PROTIME-INR
INR: 1.1 (ref 0.8–1.2)
Prothrombin Time: 13.3 seconds (ref 11.4–15.2)

## 2020-02-15 LAB — MAGNESIUM: Magnesium: 1.8 mg/dL (ref 1.7–2.4)

## 2020-02-15 LAB — APTT: aPTT: 73 seconds — ABNORMAL HIGH (ref 24–36)

## 2020-02-15 LAB — HEPARIN LEVEL (UNFRACTIONATED): Heparin Unfractionated: 0.51 IU/mL (ref 0.30–0.70)

## 2020-02-15 LAB — C-REACTIVE PROTEIN: CRP: 8 mg/dL — ABNORMAL HIGH (ref <1.0)

## 2020-02-15 MED ORDER — ENOXAPARIN SODIUM 60 MG/0.6ML ~~LOC~~ SOLN: 50.0000 mg | SUBCUTANEOUS | Status: DC

## 2020-02-15 MED ORDER — ONDANSETRON HCL 4 MG/2ML IJ SOLN
4.0000 mg | Freq: Four times a day (QID) | INTRAMUSCULAR | Status: DC
  Administered 2020-02-15 – 2020-02-20 (×12): 4 mg via INTRAVENOUS
  Filled 2020-02-15 (×14): qty 2

## 2020-02-15 MED ORDER — HEPARIN (PORCINE) 25000 UT/250ML-% IV SOLN
16.0000 [IU]/kg/h | INTRAVENOUS | Status: DC
  Administered 2020-02-15: 16 [IU]/kg/h via INTRAVENOUS
  Filled 2020-02-15: qty 250

## 2020-02-15 MED ORDER — ENOXAPARIN SODIUM 60 MG/0.6ML ~~LOC~~ SOLN
50.0000 mg | SUBCUTANEOUS | Status: DC
  Administered 2020-02-15 – 2020-02-20 (×6): 50 mg via SUBCUTANEOUS
  Filled 2020-02-15 (×6): qty 0.6

## 2020-02-15 MED ORDER — ASCORBIC ACID 500 MG PO TABS
500.0000 mg | ORAL_TABLET | Freq: Every day | ORAL | Status: DC
  Administered 2020-02-16 – 2020-02-22 (×7): 500 mg via ORAL
  Filled 2020-02-15 (×10): qty 1

## 2020-02-15 MED ORDER — IOHEXOL 350 MG/ML SOLN
80.0000 mL | Freq: Once | INTRAVENOUS | Status: AC | PRN
  Administered 2020-02-15: 80 mL via INTRAVENOUS

## 2020-02-15 MED ORDER — ZINC SULFATE 220 (50 ZN) MG PO CAPS
220.0000 mg | ORAL_CAPSULE | Freq: Every day | ORAL | Status: DC
  Administered 2020-02-16 – 2020-02-22 (×7): 220 mg via ORAL
  Filled 2020-02-15 (×7): qty 1

## 2020-02-15 MED ORDER — MENTHOL 3 MG MT LOZG
1.0000 | LOZENGE | OROMUCOSAL | Status: DC | PRN
  Administered 2020-02-15: 3 mg via ORAL
  Filled 2020-02-15: qty 9

## 2020-02-15 MED ORDER — POTASSIUM CHLORIDE CRYS ER 20 MEQ PO TBCR
40.0000 meq | EXTENDED_RELEASE_TABLET | Freq: Two times a day (BID) | ORAL | Status: DC
  Administered 2020-02-15 – 2020-02-17 (×4): 40 meq via ORAL
  Filled 2020-02-15 (×5): qty 2

## 2020-02-15 MED ORDER — HEPARIN BOLUS VIA INFUSION
4700.0000 [IU] | Freq: Once | INTRAVENOUS | Status: AC
  Administered 2020-02-15: 4700 [IU] via INTRAVENOUS
  Filled 2020-02-15: qty 4700

## 2020-02-15 NOTE — Plan of Care (Signed)
  Problem: Education: Goal: Knowledge of disease or condition will improve 02/15/2020 0508 by Lars Masson, RN Outcome: Progressing 02/14/2020 2316 by Lars Masson, RN Outcome: Progressing Goal: Knowledge of the prescribed therapeutic regimen will improve Outcome: Progressing Goal: Individualized Educational Video(s) Outcome: Progressing   Problem: Clinical Measurements: Goal: Complications related to the disease process, condition or treatment will be avoided or minimized 02/15/2020 0508 by Lars Masson, RN Outcome: Progressing 02/14/2020 2316 by Lars Masson, RN Outcome: Progressing   Problem: Education: Goal: Knowledge of disease or condition will improve Outcome: Progressing   Problem: Education: Goal: Knowledge of risk factors and measures for prevention of condition will improve Outcome: Progressing

## 2020-02-15 NOTE — Progress Notes (Signed)
[place] COMPREHENSIVE PROGRESS NOTE  Misty Murillo is a 30 y.o. G3P1011 with dichorionic, diamniotic twin gestation at [redacted]w[redacted]d who is admitted for COVID pneumonia.  Estimated Date of Delivery: 07/01/20  Length of Stay:  1 Days. Admitted 02/14/2020  Subjective: Still working hard to breathe, on 2 L [place].  Was getting BLE dopplers, preliminary read negative.  Patient reports some fetal movement.  She reports no uterine contractions, no bleeding and no loss of fluid per vagina.  Vitals:  Blood pressure (!) 106/57, pulse (!) 106, temperature 99.3 F (37.4 C), temperature source Oral, resp. rate (!) 24, last menstrual period 09/25/2019, SpO2 96 %, unknown if currently breastfeeding. Physical Examination: CONSTITUTIONAL:Appears tired SKIN: Skin is warm and dry. No rash noted. Not diaphoretic. No erythema. No pallor. [place]: Alert and oriented to person, place, and time. Normal reflexes, muscle tone coordination. No cranial nerve deficit noted. PSYCHIATRIC: Normal mood and affect. Normal behavior. Normal judgment and thought content. CARDIOVASCULAR: Elevated heart rate noted, regular rhythm RESPIRATORY: Working hard to breathe a little especially while talking MUSCULOSKELETAL: Normal range of motion. No edema and no tenderness. 2+ distal pulses. ABDOMEN: Soft, nontender, nondistended, gravid. CERVIX:  Deferred  Fetal monitoring: FHR: 160 bpm and 145 bpm Uterine activity: None reported  Results for orders placed or performed during the hospital encounter of 02/14/20 (from the past 48 hour(s))  Urinalysis, Routine w reflex microscopic Urine, Clean Catch     Status: Abnormal   Collection Time: 02/14/20  4:39 PM  Result Value Ref Range   Color, Urine AMBER (A) YELLOW    Comment: BIOCHEMICALS MAY BE AFFECTED BY COLOR   APPearance CLOUDY (A) CLEAR   Specific Gravity, Urine 1.016 1.005 - 1.030   pH 5.0 5.0 - 8.0   Glucose, UA NEGATIVE NEGATIVE mg/dL   Hgb urine dipstick NEGATIVE  NEGATIVE   Bilirubin Urine SMALL (A) NEGATIVE   Ketones, ur NEGATIVE NEGATIVE mg/dL   Protein, ur 30 (A) NEGATIVE mg/dL   Nitrite NEGATIVE NEGATIVE   Leukocytes,Ua NEGATIVE NEGATIVE   RBC / HPF 0-5 0 - 5 RBC/hpf   WBC, UA 11-20 0 - 5 WBC/hpf   Bacteria, UA RARE (A) NONE SEEN   Squamous Epithelial / LPF 11-20 0 - 5   Mucus PRESENT    Hyaline Casts, UA PRESENT     Comment: Performed at [facility], [address] [place], [place] [address]  Type and screen [place]     Status: None   Collection Time: 02/14/20  4:50 PM  Result Value Ref Range   ABO/RH(D) O POS    Antibody Screen NEG    Sample Expiration      02/17/2020,2359 Performed at [facility], [place] [address] [place], [place] [address]   CBC     Status: Abnormal   Collection Time: 02/14/20  4:55 PM  Result Value Ref Range   WBC 6.6 4.0 - 10.5 K/uL   RBC 3.53 (L) 3.87 - 5.11 MIL/uL   Hemoglobin 10.2 (L) 12.0 - 15.0 g/dL   HCT 29.6 (L) 36.0 - 46.0 %   MCV 83.9 80.0 - 100.0 fL   MCH 28.9 26.0 - 34.0 pg   MCHC 34.5 30.0 - 36.0 g/dL   RDW 13.5 11.5 - 15.5 %   Platelets 220 150 - 400 K/uL   nRBC 0.0 0.0 - 0.2 %    Comment: Performed at [facility], [place] [address], [place], [place] [address]  Comprehensive metabolic panel     Status: Abnormal   Collection Time: 02/14/20  4:55 PM  Result Value Ref Range   Sodium 131 (L) 135 - 145 mmol/L   Potassium 4.3 3.5 - 5.1 mmol/L   Chloride 99 98 - 111 mmol/L   CO2 20 (L) 22 - 32 mmol/L   Glucose, Bld 112 (H) 70 - 99 mg/dL    Comment: Glucose reference range applies only to samples taken after fasting for at least 8 hours.   BUN <5 (L) 6 - 20 mg/dL   Creatinine, Ser 0.61 0.44 - 1.00 mg/dL   Calcium 8.5 (L) 8.9 - 10.3 mg/dL   Total Protein 6.3 (L) 6.5 - 8.1 g/dL   Albumin 2.4 (L) 3.5 - 5.0 g/dL   AST 82 (H) 15 - 41 U/L   ALT 36 0 - 44 U/L   Alkaline Phosphatase 93 38 - 126 U/L   Total Bilirubin 2.2 (H) 0.3 - 1.2 mg/dL   GFR, Estimated >60  >60 mL/min    Comment: (NOTE) Calculated using the CKD-EPI Creatinine Equation (2021)    Anion gap 12 5 - 15    Comment: Performed at [facility], [place] [address], [place], [place] [address]  Lipase, blood     Status: None   Collection Time: 02/14/20  4:55 PM  Result Value Ref Range   Lipase 21 11 - 51 U/L    Comment: Performed at [place] [address], [place], [place] [address]  Ferritin     Status: None   Collection Time: 02/14/20  8:57 PM  Result Value Ref Range   Ferritin 150 11 - 307 ng/mL    Comment: Performed at [facility], [place] [address], [place], [place] [address]  Fibrinogen     Status: None   Collection Time: 02/14/20  8:57 PM  Result Value Ref Range   Fibrinogen 417 210 - 475 mg/dL    Comment: Performed at [facility] [address] [place], [place] [address]  D-dimer, quantitative (not at [facility])     Status: Abnormal   Collection Time: 02/14/20  8:57 PM  Result Value Ref Range   D-Dimer, Quant 2.52 (H) 0.00 - 0.50 ug/mL-FEU    Comment: (NOTE) At the manufacturer cut-off value of 0.5 g/mL FEU, this assay has a negative predictive value of 95-100%.This assay is intended for use in conjunction with a clinical pretest probability (PTP) assessment model to exclude pulmonary embolism (PE) and deep venous thrombosis (DVT) in outpatients suspected of PE or DVT. Results should be correlated with clinical presentation. Performed at [facility], [place] [address], [place], [place] [address]   C-reactive protein     Status: Abnormal   Collection Time: 02/14/20  8:57 PM  Result Value Ref Range   CRP 6.8 (H) <1.0 mg/dL    Comment: Performed at [place] [address] [place], [place] [address]  Lactate dehydrogenase     Status: Abnormal   Collection Time: 02/14/20  8:57 PM  Result Value Ref Range   LDH 265 (H) 98 - 192 U/L    Comment: Performed at [facility], [place] [address], [place], [place] [address]   Triglycerides     Status: Abnormal   Collection Time: 02/14/20  8:57 PM  Result Value Ref Range   Triglycerides 197 (H) <150 mg/dL    Comment: Performed at [facility] [address],  [place], [place] [address]  Procalcitonin     Status: None   Collection Time: 02/14/20  8:57 PM  Result Value Ref Range   Procalcitonin 0.21 ng/mL    Comment:        Interpretation: PCT (Procalcitonin) <= 0.5 ng/mL: Systemic infection (sepsis) is not likely. Local bacterial infection is possible. (NOTE)       Sepsis PCT Algorithm           Lower Respiratory Tract                                      Infection PCT Algorithm    ----------------------------     ----------------------------         PCT < 0.25 ng/mL                PCT < 0.10 ng/mL          Strongly encourage             Strongly discourage   discontinuation of antibiotics    initiation of antibiotics    ----------------------------     -----------------------------       PCT 0.25 - 0.50 ng/mL            PCT 0.10 - 0.25 ng/mL               OR       >80% decrease in PCT            Discourage initiation of                                            antibiotics      Encourage discontinuation           of antibiotics    ----------------------------     -----------------------------         PCT >= 0.50 ng/mL              PCT 0.26 - 0.50 ng/mL               AND        <80% decrease in PCT             Encourage initiation of                                             antibiotics       Encourage continuation           of antibiotics    ----------------------------     -----------------------------        PCT >= 0.50 ng/mL                  PCT > 0.50 ng/mL               AND         increase in PCT                  Strongly encourage  initiation of antibiotics    Strongly encourage escalation           of antibiotics                                     -----------------------------                                            PCT <= 0.25 ng/mL                                                 OR                                        > 80% decrease in PCT                                      Discontinue / Do not initiate                                             antibiotics  Performed at [facility], [address] [place], [place] [address]   HIV Antibody (routine testing w rflx)     Status: None   Collection Time: 02/14/20  8:57 PM  Result Value Ref Range   HIV Screen 4th Generation wRfx Non Reactive Non Reactive    Comment: Performed at [facility], [place] [address], [place], [place] [address]  Lactic acid, plasma     Status: None   Collection Time: 02/14/20  8:57 PM  Result Value Ref Range   Lactic Acid, Venous 1.1 0.5 - 1.9 mmol/L    Comment: Performed at [facility] [address] [place], [place] [address]  Protime-INR     Status: None   Collection Time: 02/15/20 12:22 AM  Result Value Ref Range   Prothrombin Time 13.3 11.4 - 15.2 seconds   INR 1.1 0.8 - 1.2    Comment: (NOTE) INR goal varies based on device and disease states. Performed at [facility], [place] [address] [place],  [address]   CBC with Differential/Platelet     Status: Abnormal   Collection Time: 02/15/20  7:27 AM  Result Value Ref Range   WBC 5.6 4.0 - 10.5 K/uL   RBC 3.05 (L) 3.87 - 5.11 MIL/uL   Hemoglobin 8.7 (L) 12.0 - 15.0 g/dL   HCT 25.5 (L) 36.0 - 46.0 %   MCV 83.6 80.0 - 100.0 fL   MCH 28.5 26.0 - 34.0 pg   MCHC 34.1 30.0 - 36.0 g/dL   RDW 13.6 11.5 - 15.5 %   Platelets 197 150 - 400 K/uL   nRBC 0.0 0.0 - 0.2 %   Neutrophils Relative % 81 %   Neutro Abs 4.5 1.7 - 7.7 K/uL   Lymphocytes Relative 11 %   Lymphs Abs  0.6 (L) 0.7 - 4.0 K/uL   Monocytes Relative 5 %   Monocytes Absolute 0.3 0.1 - 1.0 K/uL   Eosinophils Relative 0 %   Eosinophils Absolute 0.0 0.0 - 0.5 K/uL   Basophils Relative 0 %   Basophils Absolute 0.0 0.0 - 0.1 K/uL   Immature  Granulocytes 3 %   Abs Immature Granulocytes 0.19 (H) 0.00 - 0.07 K/uL    Comment: Performed at [facility] [address], [place], [place] [address]  Comprehensive metabolic panel     Status: Abnormal   Collection Time: 02/15/20  7:27 AM  Result Value Ref Range   Sodium 135 135 - 145 mmol/L   Potassium 2.7 (LL) 3.5 - 5.1 mmol/L    Comment: CRITICAL RESULT CALLED TO, READ BACK BY AND VERIFIED WITH: C.FOUTES,RN 02/15/2020 0904 DAVISB    Chloride 102 98 - 111 mmol/L   CO2 21 (L) 22 - 32 mmol/L   Glucose, Bld 109 (H) 70 - 99 mg/dL    Comment: Glucose reference range applies only to samples taken after fasting for at least 8 hours.   BUN <5 (L) 6 - 20 mg/dL   Creatinine, Ser 0.53 0.44 - 1.00 mg/dL   Calcium 8.3 (L) 8.9 - 10.3 mg/dL   Total Protein 5.7 (L) 6.5 - 8.1 g/dL   Albumin 2.1 (L) 3.5 - 5.0 g/dL   AST 43 (H) 15 - 41 U/L   ALT 28 0 - 44 U/L   Alkaline Phosphatase 81 38 - 126 U/L   Total Bilirubin 1.3 (H) 0.3 - 1.2 mg/dL   GFR, Estimated >60 >60 mL/min    Comment: (NOTE) Calculated using the CKD-EPI Creatinine Equation (2021)    Anion gap 12 5 - 15    Comment: Performed at [facility], [place] [address], [place], [place] [address]  C-reactive protein     Status: Abnormal   Collection Time: 02/15/20  7:27 AM  Result Value Ref Range   CRP 8.0 (H) <1.0 mg/dL    Comment: Performed at [facility] [address] [place], [place] [address]  D-dimer, quantitative (not at [facility])     Status: Abnormal   Collection Time: 02/15/20  7:27 AM  Result Value Ref Range   D-Dimer, Quant 1.72 (H) 0.00 - 0.50 ug/mL-FEU    Comment: (NOTE) At the manufacturer cut-off value of 0.5 g/mL FEU, this assay has a negative predictive value of 95-100%.This assay is intended for use in conjunction with a clinical pretest probability (PTP) assessment model to exclude pulmonary embolism (PE) and deep venous thrombosis (DVT) in outpatients suspected of PE or DVT. Results should be  correlated with clinical presentation. Performed at [facility], [place] [address] [place], [place] [address]   Heparin level (unfractionated)     Status: None   Collection Time: 02/15/20  7:27 AM  Result Value Ref Range   Heparin Unfractionated 0.51 0.30 - 0.70 IU/mL    Comment: (NOTE) If heparin results are below expected values, and patient dosage has  been confirmed, suggest follow up testing of antithrombin III levels. Performed at [facility], [place] [address], [place], [place] [address]   APTT     Status: Abnormal   Collection Time: 02/15/20  7:27 AM  Result Value Ref Range   aPTT 73 (H) 24 - 36 seconds    Comment:        IF BASELINE aPTT IS ELEVATED, SUGGEST PATIENT RISK ASSESSMENT BE  USED TO DETERMINE APPROPRIATE ANTICOAGULANT THERAPY. Performed at [facility], [place] [address], [place], [place] [address]     No results found.  Current scheduled medications . aspirin EC  81 mg Oral Daily  . dexamethasone (DECADRON) injection  6 mg Intravenous Q24H  . docusate sodium  100 mg Oral Daily  . famotidine  20 mg Oral BID  . ondansetron  4 mg Oral Q6H  . potassium chloride  40 mEq Oral BID  . prenatal multivitamin  1 tablet Oral Q1200    I have reviewed the patient's current medications.  ASSESSMENT: Principal Problem:   Pneumonia due to COVID-19 virus Active Problems:   Dichorionic diamniotic twin gestation   Sepsis ([place])   Acute hypoxemic respiratory failure (HCC)   Intractable nausea and vomiting   Diarrhea   [redacted] weeks gestation of pregnancy   Suspected pulmonary embolism  PLAN: Appreciate [facility] co-management, appreciate the recommendations Patient was started on heparin drip for suspected PE (Elevated HR, D-dimer) but concrete diagnosis is needed so ascertain if heparin drip needs to continue. If negative, she can just be on prophylactic Lovenox. Reassured by negative BLEdopplers. Continue Remdesivir (Day 2) and Dexamethasone Continue  supplemental oxygen as needed to maintain SpO2 >95% Reassuring fetal status, no acute obstetric concerns. Daily dopplers for FHR checks. Continue routine antenatal care.   Verita Schneiders, MD, [place] for [employer], [place]

## 2020-02-15 NOTE — Progress Notes (Signed)
EKG lab personal  called and said pt's previous ekg done was incorrect and ekg needs to be repeated. Will put in another order and  Will notify incoming RN .

## 2020-02-15 NOTE — Progress Notes (Signed)
PROGRESS NOTE    Misty Murillo  BCW:[id number] DOB: 03/04/1990 DOA: 02/14/2020 PCP: Orma Flaming, MD    Brief Narrative:  30 y.o. female [phone] _[phone]  gestation with twins, Graves' disease, depression, anxiety, history of PID presented to [facility]U complaining of symptoms of an acute viral illness.  She tested positive for COVID-19 on 1/21 during ED visit and then returned on 1/23 and was diagnosed with Covid pneumonia.  On arrival to MAU today, patient was febrile, tachycardic, tachypneic, and SPO2 80% on room air when supine.  Not hypotensive.  WBC 6.6, hemoglobin 10.2 (stable compared to recent labs), platelet count 220K.  Sodium 131, potassium 4.3, chloride 99, bicarb 20, anion gap 12, BUN <5, creatinine 0.6, glucose 112.  AST 82, ALT 36, alk phos 93, T bili 2.2.  Lipase normal. She was given Tylenol, Zofran, and 1 L LR bolus.  Patient is not vaccinated against Covid. She reports 1 week history of body aches, fatigue, cough, shortness of breath, nausea, vomiting, and diarrhea. States she is not able to tolerate any p.o. intake. She is also having discomfort in her lower abdomen. States she has Graves' disease for which she is seen by endocrinologist Dr. Dwyane Dee and currently not on any treatments as her thyroid function labs were normal during recent office visit and her endocrinologist told her he will continue to monitor her labs periodically. No other complaints.  Assessment & Plan:   Principal Problem:   Pneumonia due to COVID-19 virus Active Problems:   Dichorionic diamniotic twin gestation   Sepsis ([place])   Acute hypoxemic respiratory failure (HCC)   Intractable nausea and vomiting   Diarrhea   [redacted] weeks gestation of pregnancy  Sepsis and acute hypoxemic respiratory failure secondary to COVID-19 viral pneumonia: Meets criteria for sepsis -3 SIRS (fever, tachycardia, tachypnea) and SARS-CoV-2 PCR test positive on 02/10/2020.  No hypotension to suggest severe sepsis.  Chest x-ray done  02/12/2020 showing new focal consolidation in the right midlung.  Her oxygen saturation previously dropped to 80% on room air at MAU when supine.  Currently O2 sats are over 94-96% on 2LNC.  No tachypnea or increased work of breathing. -cont with Remdesivir -Cont with IV Decadron 6 mg daily -Tussionex as needed for cough -Tylenol as needed for fevers -Cont with bronchodilators as needed -Check ferritin, fibrinogen, D-dimer, CRP, LDH, triglycerides -Check procalcitonin level -Daily CBC with differential, CMP, CRP, D-dimer -CT chest reviewed. No evidence of PE, but confirms PNA already seen on CXR  Intractable nausea and vomiting Diarrhea Suspect symptoms are due to COVID-19 viral gastroenteritis.  AST 82, ALT 36, alk phos 93, T bili 2.2.  Lipase normal.  Mild elevation of LFTs likely due to COVID-19 viral infection.   -Continue with supportive care  Mild hyponatremia: Likely due to poor oral intake/vomiting/diarrhea.  Sodium normalized -continued on IV fluid hydration, continue to monitor sodium level  Mild normal anion gap metabolic acidosis: Likely due to diarrhea.  Bicarb 20, anion gap 12. -Continue with IV fluid hydration, continue to monitor  Tachycardia: TSH and free T4 checked on 01/31/2020 were normal. -Cont with heart monitor as needed  History of Graves' disease: Patient is followed by endocrinology and recent thyroid function tests were normal. -Recommend f/u with Endocrine as outpatient   Pregnancy (43w2dgestation with twins) -Management per primary team.  DVT prophylaxis: Lovenox subq Code Status: Full Family Communication: Pt in room, family at bedside  Status is: Inpatient  Remains inpatient appropriate because:Unsafe d/c plan, IV treatments appropriate due  to intensity of illness or inability to take PO and Inpatient level of care appropriate due to severity of illness   Dispo: The patient is from: Home              Anticipated d/c is to: Home               Anticipated d/c date is: 3 days              Patient currently is not medically stable to d/c.   Difficult to place patient No   Consultants:     Procedures:     Antimicrobials: Anti-infectives (From admission, onward)   Start     Dose/Rate Route Frequency Ordered Stop   02/15/20 1000  remdesivir 100 mg in sodium chloride 0.9 % 100 mL IVPB       "Followed by" Linked Group Details   100 mg 200 mL/hr over 30 Minutes Intravenous Daily 02/14/20 2034 02/19/20 0959   02/14/20 2130  remdesivir 200 mg in sodium chloride 0.9% 250 mL IVPB       "Followed by" Linked Group Details   200 mg 580 mL/hr over 30 Minutes Intravenous Once 02/14/20 2034 02/14/20 2202       Subjective: Still with subjective sob. Complaining of sore throat  Objective: Vitals:   02/15/20 0853 02/15/20 1225 02/15/20 1500 02/15/20 1538  BP: (!) 106/57 (!) 116/59  112/63  Pulse: (!) 106 (!) 110  (!) 113  Resp: (!) 24 (!) 24    Temp: 99.3 F (37.4 C) 98.7 F (37.1 C)  99.3 F (37.4 C)  TempSrc: Oral Oral  Oral  SpO2: 96% 95% 97% 96%    Intake/Output Summary (Last 24 hours) at 02/15/2020 1556 Last data filed at 02/15/2020 1300 Gross per 24 hour  Intake 1518.92 ml  Output 1200 ml  Net 318.92 ml   There were no vitals filed for this visit.  Examination:  General exam: Appears calm and comfortable  Respiratory system: Normal resp effort, no audible wheezing Cardiovascular system: Perfused, no notable JVD Gastrointestinal system: gravid, nondistended Central nervous system: Alert. No focal neurological deficits. Extremities: Symmetric 5 x 5 power. Skin: No rashes, lesions  Psychiatry: Judgement and insight appear normal. Mood & affect appropriate.   Data Reviewed: I have personally reviewed following labs and imaging studies  CBC: Recent Labs  Lab 02/10/20 0536 02/12/20 0937 02/14/20 1655 02/15/20 0727  WBC 6.5 6.1 6.6 5.6  NEUTROABS 5.4 5.1  --  4.5  HGB 10.3* 10.7* 10.2* 8.7*  HCT 29.8*  30.2* 29.6* 25.5*  MCV 85.9 84.6 83.9 83.6  PLT 264 245 220 606   Basic Metabolic Panel: Recent Labs  Lab 02/10/20 0536 02/12/20 0937 02/14/20 1655 02/15/20 0727  NA 131* 131* 131* 135  K 3.1* 2.8* 4.3 2.7*  CL 101 102 99 102  CO2 20* 20* 20* 21*  GLUCOSE 109* 114* 112* 109*  BUN <5* <5* <5* <5*  CREATININE 0.54 0.52 0.61 0.53  CALCIUM 8.6* 8.3* 8.5* 8.3*  MG  --  2.1  --   --    GFR: Estimated Creatinine Clearance: 122 mL/min (by C-G formula based on SCr of 0.53 mg/dL). Liver Function Tests: Recent Labs  Lab 02/10/20 0536 02/14/20 1655 02/15/20 0727  AST 39 82* 43*  ALT 35 36 28  ALKPHOS 96 93 81  BILITOT 0.4 2.2* 1.3*  PROT 7.0 6.3* 5.7*  ALBUMIN 2.9* 2.4* 2.1*   Recent Labs  Lab 02/14/20 1655  LIPASE 21  No results for input(s): AMMONIA in the last 168 hours. Coagulation Profile: Recent Labs  Lab 02/15/20 0022  INR 1.1   Cardiac Enzymes: No results for input(s): CKTOTAL, CKMB, CKMBINDEX, TROPONINI in the last 168 hours. BNP (last 3 results) No results for input(s): PROBNP in the last 8760 hours. HbA1C: No results for input(s): HGBA1C in the last 72 hours. CBG: No results for input(s): GLUCAP in the last 168 hours. Lipid Profile: Recent Labs    02/14/20 2057  TRIG 197*   Thyroid Function Tests: No results for input(s): TSH, T4TOTAL, FREET4, T3FREE, THYROIDAB in the last 72 hours. Anemia Panel: Recent Labs    02/14/20 2057  FERRITIN 150   Sepsis Labs: Recent Labs  Lab 02/10/20 0536 02/10/20 0818 02/14/20 2057  PROCALCITON  --   --  0.21  LATICACIDVEN 1.1 1.4 1.1    Recent Results (from the past 240 hour(s))  SARS Coronavirus 2 by RT PCR (hospital order, performed in [facility] hospital lab) Nasopharyngeal Nasopharyngeal Swab     Status: Abnormal   Collection Time: 02/10/20  4:42 AM   Specimen: Nasopharyngeal Swab  Result Value Ref Range Status   SARS Coronavirus 2 POSITIVE (A) NEGATIVE Final    Comment: RESULT CALLED TO, READ  BACK BY AND VERIFIED WITH: Sharyon Cable AT 2703 ON 02/10/20 BY HUFFINES,S. (NOTE) SARS-CoV-2 target nucleic acids are DETECTED  SARS-CoV-2 RNA is generally detectable in upper respiratory specimens  during the acute phase of infection.  Positive results are indicative  of the presence of the identified virus, but do not rule out bacterial infection or co-infection with other pathogens not detected by the test.  Clinical correlation with patient history and  other diagnostic information is necessary to determine patient infection status.  The expected result is negative.  Fact Sheet for Patients:   StrictlyIdeas.no   Fact Sheet for Healthcare Providers:   BankingDealers.co.za    This test is not yet approved or cleared by the [place] FDA and  has been authorized for detection and/or diagnosis of SARS-CoV-2 by FDA under an Emergency Use Authorization (EUA).  This EUA will remain in effect (meanin g this test can be used) for the duration of  the COVID-19 declaration under Section 564(b)(1) of the Act, 21 U.S.C. section 360-bbb-3(b)(1), unless the authorization is terminated or revoked sooner.  Performed at [facility], [address], [place], [place] [address]   Culture, blood (Routine X 2) w Reflex to ID Panel     Status: None (Preliminary result)   Collection Time: 02/14/20  8:57 PM   Specimen: BLOOD  Result Value Ref Range Status   Specimen Description BLOOD RIGHT ANTECUBITAL  Final   Special Requests   Final    BOTTLES DRAWN AEROBIC AND ANAEROBIC Blood Culture adequate volume   Culture   Final    NO GROWTH < 24 HOURS Performed at [facility], [place] [address], [place], [place] [address]    Report Status PENDING  Incomplete  Culture, blood (Routine X 2) w Reflex to ID Panel     Status: None (Preliminary result)   Collection Time: 02/14/20  8:57 PM   Specimen: BLOOD RIGHT HAND  Result Value Ref Range Status   Specimen  Description BLOOD RIGHT HAND  Final   Special Requests   Final    BOTTLES DRAWN AEROBIC AND ANAEROBIC Blood Culture adequate volume   Culture   Final    NO GROWTH < 24 HOURS Performed at [facility],  [address], [place], [place] [address]    Report Status PENDING  Incomplete     Radiology Studies: CT ANGIO CHEST PE W OR WO CONTRAST  Result Date: 02/15/2020 CLINICAL DATA:  Shortness of breath, cough, COVID positive EXAM: CT ANGIOGRAPHY CHEST WITH CONTRAST TECHNIQUE: Multidetector CT imaging of the chest was performed using the standard protocol during bolus administration of intravenous contrast. Multiplanar CT image reconstructions and MIPs were obtained to evaluate the vascular anatomy. CONTRAST:  48m OMNIPAQUE IOHEXOL 350 MG/ML SOLN COMPARISON:  01/28/2018 FINDINGS: Cardiovascular: Heart size normal. No pericardial effusion. Fair contrast opacification of pulmonary artery branches. No convincing filling defects to diagnose acute PE. Adequate contrast opacification of the thoracic aorta with no evidence of dissection, aneurysm, or stenosis. There is classic 3-vessel brachiocephalic arch anatomy without proximal stenosis. Mediastinum/Nodes: Right hilar and subcarinal adenopathy, new since previous, presumably reactive. Central airways unremarkable. Lungs/Pleura: No pleural effusion. No pneumothorax. New scattered somewhat nodular bilateral airspace opacities with a predominantly perihilar distribution, involving bases more than upper lobes. Upper Abdomen: No acute abnormality. Musculoskeletal: No chest wall abnormality. No acute or significant osseous findings. Review of the MIP images confirms the above findings. IMPRESSION: 1. Negative for acute PE or thoracic aortic dissection. 2. New scattered somewhat nodular bilateral airspace opacities with a predominantly perihilar distribution, involving bases more than upper lobes. Favor infectious/inflammatory etiology given rapid progression. 3.  Right hilar and subcarinal adenopathy, presumably reactive. Electronically Signed   By: DLucrezia EuropeM.D.   On: 02/15/2020 13:48   VAS [place]OWER EXTREMITY VENOUS (DVT)  Result Date: 02/15/2020  Lower Venous DVT Study Indications: Covid,elevated ddimer.  Limitations: Pain tolerance. Comparison Study: no prior Performing Technologist: MAbram SanderRVS  Examination Guidelines: A complete evaluation includes B-mode imaging, spectral Doppler, color Doppler, and power Doppler as needed of all accessible portions of each vessel. Bilateral testing is considered an integral part of a complete examination. Limited examinations for reoccurring indications may be performed as noted. The reflux portion of the exam is performed with the patient in reverse Trendelenburg.  +---------+---------------+---------+-----------+----------+-------------------+ RIGHT    CompressibilityPhasicitySpontaneityPropertiesThrombus Aging      +---------+---------------+---------+-----------+----------+-------------------+ CFV      Full           Yes      Yes                                      +---------+---------------+---------+-----------+----------+-------------------+ SFJ      Full                                                             +---------+---------------+---------+-----------+----------+-------------------+ FV Prox  Full                                                             +---------+---------------+---------+-----------+----------+-------------------+ FV Mid                  Yes      Yes                                      +---------+---------------+---------+-----------+----------+-------------------+  FV Distal               Yes      Yes                                      +---------+---------------+---------+-----------+----------+-------------------+ PFV      Full                                                              +---------+---------------+---------+-----------+----------+-------------------+ POP      Partial        Yes      Yes                                      +---------+---------------+---------+-----------+----------+-------------------+ PTV      Full                                                             +---------+---------------+---------+-----------+----------+-------------------+ PERO                                                  Not well visualized +---------+---------------+---------+-----------+----------+-------------------+   +---------+---------------+---------+-----------+----------+-------------------+ LEFT     CompressibilityPhasicitySpontaneityPropertiesThrombus Aging      +---------+---------------+---------+-----------+----------+-------------------+ CFV      Full           Yes      Yes                                      +---------+---------------+---------+-----------+----------+-------------------+ SFJ      Full                                                             +---------+---------------+---------+-----------+----------+-------------------+ FV Prox  Full                                                             +---------+---------------+---------+-----------+----------+-------------------+ FV Mid                  Yes      Yes                                      +---------+---------------+---------+-----------+----------+-------------------+ FV Distal  Yes      Yes                                      +---------+---------------+---------+-----------+----------+-------------------+ PFV      Full                                                             +---------+---------------+---------+-----------+----------+-------------------+ POP      Full           Yes      Yes                                      +---------+---------------+---------+-----------+----------+-------------------+  PTV      Full                                                             +---------+---------------+---------+-----------+----------+-------------------+ PERO                                                  Not well visualized +---------+---------------+---------+-----------+----------+-------------------+     Summary: BILATERAL: - No evidence of deep vein thrombosis seen in the lower extremities, bilaterally. - No evidence of superficial venous thrombosis in the lower extremities, bilaterally. -No evidence of popliteal cyst, bilaterally.   *See table(s) above for measurements and observations.    Preliminary     Scheduled Meds: . aspirin EC  81 mg Oral Daily  . dexamethasone (DECADRON) injection  6 mg Intravenous Q24H  . docusate sodium  100 mg Oral Daily  . enoxaparin (LOVENOX) injection  50 mg Subcutaneous Q24H  . famotidine  20 mg Oral BID  . ondansetron  4 mg Oral Q6H  . potassium chloride  40 mEq Oral BID  . prenatal multivitamin  1 tablet Oral Q1200   Continuous Infusions: . lactated ringers 75 mL/hr at 02/15/20 1100  . remdesivir 100 mg in NS 100 mL 100 mg (02/15/20 0957)   Time spent:28mn   LOS: 1 day   SMarylu Lund MD [facility] Pager On Amion  If 7PM-7AM, please contact night-coverage 02/15/2020, 3:56 PM

## 2020-02-15 NOTE — Progress Notes (Addendum)
CRITICAL VALUE ALERT  Critical Value:  Potassium 2.7  Date & Time Notied:  1/26 @0903   Provider Notified: Dr Harolyn Rutherford notified (face to face)  Orders Received/Actions taken: MD to place order for KCl

## 2020-02-15 NOTE — Progress Notes (Signed)
ANTICOAGULATION CONSULT NOTE - Initial Consult  Pharmacy Consult for heparin Indication: pulmonary embolus  No Known Allergies  Patient Measurements:   Heparin Dosing Weight: 77.65 kg  Vital Signs: Temp: 99.2 F (37.3 C) (01/25 2136) Temp Source: Oral (01/25 1922) BP: 110/66 (01/25 2136) Pulse Rate: 105 (01/25 2136)  Labs: Recent Labs    02/12/20 0937 02/14/20 1655 02/15/20 0022  HGB 10.7* 10.2*  --   HCT 30.2* 29.6*  --   PLT 245 220  --   LABPROT  --   --  13.3  INR  --   --  1.1  CREATININE 0.52 0.61  --     Estimated Creatinine Clearance: 122 mL/min (by C-G formula based on SCr of 0.61 mg/dL).   Medical History: Past Medical History:  Diagnosis Date  . Anxiety   . Depression   . Fibroid   . Graves disease   . Graves disease   . Headache   . Liver mass   . Migraines   . PID (acute pelvic inflammatory disease) 07/2019    Medications:  Infusions:  . heparin    . lactated ringers 75 mL/hr at 02/14/20 2126  . remdesivir 100 mg in NS 100 mL      Assessment: Pregnant patient ([redacted]w[redacted]d) with twins admitted for COVID pneumonia with possible PE. Team plans to work-up patient for PE in AM. Given abdominal pains and possible need for emergent intervention per MD, will begin heparin gtt for possible PE treatment.  Goal of Therapy:  Heparin level 0.3-0.7 units/ml Monitor platelets by anticoagulation protocol: Yes   Plan:  Give 4700 units bolus x 1, followed by 16 units/kg/hr (1250 units/hr). Will obtain heparin anti-Xa and aPTT at ~6 hours post start of infusion to evaluate and adjust heparin as needed. Will continue to monitor and follow-up plans by hospitalists and OB/GYN teams.  Jordan Hawks Bao Coreas 02/15/2020,1:14 AM

## 2020-02-15 NOTE — Care Management (Addendum)
CM had consult for Lovenox planning for discharge.  Patient has Medicaid insurance and should have coverage for med. CM called and spoke to the [place] and she will have a pharmacist follow up on dosage for discharge with MD and teach patient  and will follow back up with CM for any issues.  Please send script to the [facility] pharmacy here at [facility].   Rosita Fire RNC-MNN, BSN Transitions of Care Pediatrics/Women's and [place]

## 2020-02-15 NOTE — Progress Notes (Signed)
Lower extremity venous has been completed.   Preliminary results in CV Proc.   Abram Sander 02/15/2020 10:06 AM

## 2020-02-15 NOTE — Progress Notes (Signed)
    Faculty Practice OB/GYN Attending Result Note  Patient's imaging is negative for BLE DVT and PE. Heparin drip discontinued. Ordered for prophylactic Lovenox 50 mg [place] daily, to start around 1600 today as per Pharmacy recommendations. Higinio Plan, RN notified.    Verita Schneiders, MD, [place] for [employer], [place]

## 2020-02-15 NOTE — Progress Notes (Signed)
ANTICOAGULATION CONSULT NOTE - Follow Up Consult  Pharmacy Consult for Heparin   Heparin Dosing Weight: 77.65  Vital Signs: Temp: 99.3 F (37.4 C) (01/26 0853) Temp Source: Oral (01/26 0853) BP: 106/57 (01/26 0853) Pulse Rate: 106 (01/26 0853)  Labs: Recent Labs    02/14/20 1655 02/15/20 0022 02/15/20 0727  HGB 10.2*  --  8.7*  HCT 29.6*  --  25.5*  PLT 220  --  197  APTT  --   --  73*  LABPROT  --  13.3  --   INR  --  1.1  --   HEPARINUNFRC  --   --  0.51  CREATININE 0.61  --  0.53    Estimated Creatinine Clearance: 122 mL/min (by C-G formula based on SCr of 0.53 mg/dL).   Medications:   Heparin drip currently at 16 units/kg/hr (1250 units/hr)  Assessment: Follow up 6 hr heparin anti-Xa reported at 0.51 drawn 02/15/20  @ 07:27   Goal of Therapy:  Heparin level 0.3-0.7 units/ml Monitor platelets by anticoagulation protocol: Yes   Plan:  Since heparin level is within goal of therapy, will continue at current rate.  Hovey-Rankin, Jonetta Dagley 02/15/2020,10:30 AM

## 2020-02-15 NOTE — Progress Notes (Signed)
Upon assessment, this RN found two medications at pt's bedside. Pt told RN that she was given them to take earlier but could not tolerate PO meds. [place] who believes it to be Asprin and prenatal vitamin.

## 2020-02-16 LAB — CBC WITH DIFFERENTIAL/PLATELET
Abs Immature Granulocytes: 0.31 10*3/uL — ABNORMAL HIGH (ref 0.00–0.07)
Basophils Absolute: 0 10*3/uL (ref 0.0–0.1)
Basophils Relative: 0 %
Eosinophils Absolute: 0 10*3/uL (ref 0.0–0.5)
Eosinophils Relative: 0 %
HCT: 24.5 % — ABNORMAL LOW (ref 36.0–46.0)
Hemoglobin: 8.8 g/dL — ABNORMAL LOW (ref 12.0–15.0)
Immature Granulocytes: 5 %
Lymphocytes Relative: 9 %
Lymphs Abs: 0.5 10*3/uL — ABNORMAL LOW (ref 0.7–4.0)
MCH: 29.9 pg (ref 26.0–34.0)
MCHC: 35.9 g/dL (ref 30.0–36.0)
MCV: 83.3 fL (ref 80.0–100.0)
Monocytes Absolute: 0.4 10*3/uL (ref 0.1–1.0)
Monocytes Relative: 6 %
Neutro Abs: 4.7 10*3/uL (ref 1.7–7.7)
Neutrophils Relative %: 80 %
Platelets: 214 10*3/uL (ref 150–400)
RBC: 2.94 MIL/uL — ABNORMAL LOW (ref 3.87–5.11)
RDW: 13.8 % (ref 11.5–15.5)
WBC: 5.9 10*3/uL (ref 4.0–10.5)
nRBC: 0.3 % — ABNORMAL HIGH (ref 0.0–0.2)

## 2020-02-16 LAB — BLOOD GAS, ARTERIAL
Acid-base deficit: 1 mmol/L (ref 0.0–2.0)
Bicarbonate: 22.1 mmol/L (ref 20.0–28.0)
FIO2: 100
O2 Saturation: 96.2 %
pCO2 arterial: 32.9 mmHg (ref 32.0–48.0)
pH, Arterial: 7.442 (ref 7.350–7.450)
pO2, Arterial: 79.8 mmHg — ABNORMAL LOW (ref 83.0–108.0)

## 2020-02-16 LAB — COMPREHENSIVE METABOLIC PANEL
ALT: 28 U/L (ref 0–44)
AST: 48 U/L — ABNORMAL HIGH (ref 15–41)
Albumin: 1.9 g/dL — ABNORMAL LOW (ref 3.5–5.0)
Alkaline Phosphatase: 90 U/L (ref 38–126)
Anion gap: 12 (ref 5–15)
BUN: <5 mg/dL — ABNORMAL LOW (ref 6–20)
CO2: 20 mmol/L — ABNORMAL LOW (ref 22–32)
Calcium: 8.2 mg/dL — ABNORMAL LOW (ref 8.9–10.3)
Chloride: 104 mmol/L (ref 98–111)
Creatinine, Ser: 0.49 mg/dL (ref 0.44–1.00)
GFR, Estimated: >60 mL/min (ref >60)
Glucose, Bld: 121 mg/dL — ABNORMAL HIGH (ref 70–99)
Potassium: 2.9 mmol/L — ABNORMAL LOW (ref 3.5–5.1)
Sodium: 136 mmol/L (ref 135–145)
Total Bilirubin: 0.8 mg/dL (ref 0.3–1.2)
Total Protein: 5.4 g/dL — ABNORMAL LOW (ref 6.5–8.1)

## 2020-02-16 LAB — D-DIMER, QUANTITATIVE: D-Dimer, Quant: 1.19 ug/mL-FEU — ABNORMAL HIGH (ref 0.00–0.50)

## 2020-02-16 LAB — C-REACTIVE PROTEIN: CRP: 7.8 mg/dL — ABNORMAL HIGH (ref <1.0)

## 2020-02-16 MED ORDER — METHYLPREDNISOLONE SODIUM SUCC 125 MG IJ SOLR
1.0000 mg/kg | Freq: Two times a day (BID) | INTRAMUSCULAR | Status: AC
  Administered 2020-02-16 – 2020-02-18 (×6): 106.25 mg via INTRAVENOUS
  Filled 2020-02-16 (×6): qty 2

## 2020-02-16 MED ORDER — PREDNISONE 20 MG PO TABS
50.0000 mg | ORAL_TABLET | Freq: Every day | ORAL | Status: DC
  Administered 2020-02-19: 50 mg via ORAL
  Filled 2020-02-16: qty 3

## 2020-02-16 NOTE — Progress Notes (Signed)
[facility] COMPREHENSIVE PROGRESS NOTE  Misty Murillo is a 30 y.o. G3P1011 with dichorionic, diamniotic twin gestation at [redacted]w[redacted]d who is admitted for COVID pneumonia.  Estimated Date of Delivery: 07/01/20  Length of Stay:  2 Days. Admitted 02/14/2020  Subjective: Had increased respiratory distress overnight, had to be on 8 L high flow oxygen to maintain saturations. Currently on 4L. Very sleepy currently.  Patient reports some fetal movement.  She reports no uterine contractions, no bleeding and no loss of fluid per vagina.  Vitals:  Blood pressure 106/60, pulse 94, temperature 98.1 F (36.7 C), temperature source Oral, resp. rate (!) 34, last menstrual period 09/25/2019, SpO2 95 %, unknown if currently breastfeeding. Physical Examination: CONSTITUTIONAL:Appears tired SKIN: Skin is warm and dry. No rash noted. Not diaphoretic. No erythema. No pallor. [place]: Alert and oriented to person, place, and time. Normal reflexes, muscle tone coordination. No cranial nerve deficit noted. PSYCHIATRIC: Normal mood and affect. Normal behavior. Normal judgment and thought content. CARDIOVASCULAR: Elevated heart rate noted, regular rhythm RESPIRATORY: Working hard to breathe a little especially while talking MUSCULOSKELETAL: Normal range of motion. No edema and no tenderness. 2+ distal pulses. ABDOMEN: Soft, nontender, nondistended, gravid. CERVIX:  Deferred  Fetal monitoring: FHR: 168 bpm and 170 bpm Uterine activity: None reported  Results for orders placed or performed during the hospital encounter of 02/14/20 (from the past 48 hour(s))  Urinalysis, Routine w reflex microscopic Urine, Clean Catch     Status: Abnormal   Collection Time: 02/14/20  4:39 PM  Result Value Ref Range   Color, Urine AMBER (A) YELLOW    Comment: BIOCHEMICALS MAY BE AFFECTED BY COLOR   APPearance CLOUDY (A) CLEAR   Specific Gravity, Urine 1.016 1.005 - 1.030   pH 5.0 5.0 - 8.0   Glucose, UA NEGATIVE  NEGATIVE mg/dL   Hgb urine dipstick NEGATIVE NEGATIVE   Bilirubin Urine SMALL (A) NEGATIVE   Ketones, ur NEGATIVE NEGATIVE mg/dL   Protein, ur 30 (A) NEGATIVE mg/dL   Nitrite NEGATIVE NEGATIVE   Leukocytes,Ua NEGATIVE NEGATIVE   RBC / HPF 0-5 0 - 5 RBC/hpf   WBC, UA 11-20 0 - 5 WBC/hpf   Bacteria, UA RARE (A) NONE SEEN   Squamous Epithelial / LPF 11-20 0 - 5   Mucus PRESENT    Hyaline Casts, UA PRESENT     Comment: Performed at [facility], [address] [place], [place] [address]  Type and screen [place]     Status: None   Collection Time: 02/14/20  4:50 PM  Result Value Ref Range   ABO/RH(D) O POS    Antibody Screen NEG    Sample Expiration      02/17/2020,2359 Performed at [facility], [place] [address] [place], [place] [address]   CBC     Status: Abnormal   Collection Time: 02/14/20  4:55 PM  Result Value Ref Range   WBC 6.6 4.0 - 10.5 K/uL   RBC 3.53 (L) 3.87 - 5.11 MIL/uL   Hemoglobin 10.2 (L) 12.0 - 15.0 g/dL   HCT 29.6 (L) 36.0 - 46.0 %   MCV 83.9 80.0 - 100.0 fL   MCH 28.9 26.0 - 34.0 pg   MCHC 34.5 30.0 - 36.0 g/dL   RDW 13.5 11.5 - 15.5 %   Platelets 220 150 - 400 K/uL   nRBC 0.0 0.0 - 0.2 %    Comment: Performed at [facility], [place] [address]  [place], [place] [address]  Comprehensive metabolic panel     Status: Abnormal   Collection Time: 02/14/20  4:55 PM  Result Value Ref Range   Sodium 131 (L) 135 - 145 mmol/L   Potassium 4.3 3.5 - 5.1 mmol/L   Chloride 99 98 - 111 mmol/L   CO2 20 (L) 22 - 32 mmol/L   Glucose, Bld 112 (H) 70 - 99 mg/dL    Comment: Glucose reference range applies only to samples taken after fasting for at least 8 hours.   BUN <5 (L) 6 - 20 mg/dL   Creatinine, Ser 0.61 0.44 - 1.00 mg/dL   Calcium 8.5 (L) 8.9 - 10.3 mg/dL   Total Protein 6.3 (L) 6.5 - 8.1 g/dL   Albumin 2.4 (L) 3.5 - 5.0 g/dL   AST 82 (H) 15 - 41 U/L   ALT 36 0 - 44 U/L   Alkaline Phosphatase 93 38 - 126 U/L   Total Bilirubin  2.2 (H) 0.3 - 1.2 mg/dL   GFR, Estimated >60 >60 mL/min    Comment: (NOTE) Calculated using the CKD-EPI Creatinine Equation (2021)    Anion gap 12 5 - 15    Comment: Performed at [facility], [place] [address] [place], [place] [address]  Lipase, blood     Status: None   Collection Time: 02/14/20  4:55 PM  Result Value Ref Range   Lipase 21 11 - 51 U/L    Comment: Performed at [place] [address], [place], [place] [address]  Ferritin     Status: None   Collection Time: 02/14/20  8:57 PM  Result Value Ref Range   Ferritin 150 11 - 307 ng/mL    Comment: Performed at [facility], [place] [address], [place], [place] [address]  Fibrinogen     Status: None   Collection Time: 02/14/20  8:57 PM  Result Value Ref Range   Fibrinogen 417 210 - 475 mg/dL    Comment: Performed at [place] [address], [place], [place] [address]  D-dimer, quantitative (not at [facility])     Status: Abnormal   Collection Time: 02/14/20  8:57 PM  Result Value Ref Range   D-Dimer, Quant 2.52 (H) 0.00 - 0.50 ug/mL-FEU    Comment: (NOTE) At the manufacturer cut-off value of 0.5 g/mL FEU, this assay has a negative predictive value of 95-100%.This assay is intended for use in conjunction with a clinical pretest probability (PTP) assessment model to exclude pulmonary embolism (PE) and deep venous thrombosis (DVT) in outpatients suspected of PE or DVT. Results should be correlated with clinical presentation. Performed at [facility], [place] [address], [place], [place] [address]   C-reactive protein     Status: Abnormal   Collection Time: 02/14/20  8:57 PM  Result Value Ref Range   CRP 6.8 (H) <1.0 mg/dL    Comment: Performed at [place] [address], [place], [place] [address]  Lactate dehydrogenase     Status: Abnormal   Collection Time: 02/14/20  8:57 PM  Result Value Ref Range   LDH 265 (H) 98 - 192 U/L    Comment: Performed at [facility], [place] [address], [place], [place] [address]  Triglycerides     Status: Abnormal   Collection Time: 02/14/20  8:57 PM  Result Value Ref Range   Triglycerides 197 (H) <150 mg/dL    Comment: Performed at [facility],  [address], [place], [place] [address]  Procalcitonin     Status: None   Collection Time: 02/14/20  8:57 PM  Result Value Ref Range   Procalcitonin 0.21 ng/mL    Comment:        Interpretation: PCT (Procalcitonin) <= 0.5 ng/mL: Systemic infection (sepsis) is not likely. Local bacterial infection is possible. (NOTE)       Sepsis PCT Algorithm           Lower Respiratory Tract                                      Infection PCT Algorithm    ----------------------------     ----------------------------         PCT < 0.25 ng/mL                PCT < 0.10 ng/mL          Strongly encourage             Strongly discourage   discontinuation of antibiotics    initiation of antibiotics    ----------------------------     -----------------------------       PCT 0.25 - 0.50 ng/mL            PCT 0.10 - 0.25 ng/mL               OR       >80% decrease in PCT            Discourage initiation of                                            antibiotics      Encourage discontinuation           of antibiotics    ----------------------------     -----------------------------         PCT >= 0.50 ng/mL              PCT 0.26 - 0.50 ng/mL               AND        <80% decrease in PCT             Encourage initiation of                                             antibiotics       Encourage continuation           of antibiotics    ----------------------------     -----------------------------        PCT >= 0.50 ng/mL                  PCT > 0.50 ng/mL               AND         increase in PCT                  Strongly encourage  initiation of antibiotics    Strongly encourage escalation           of antibiotics                                      -----------------------------                                           PCT <= 0.25 ng/mL                                                 OR                                        > 80% decrease in PCT                                      Discontinue / Do not initiate                                             antibiotics  Performed at [facility], [address] [place], [place] [address]   Culture, blood (Routine X 2) w Reflex to ID Panel     Status: None (Preliminary result)   Collection Time: 02/14/20  8:57 PM   Specimen: BLOOD  Result Value Ref Range   Specimen Description BLOOD RIGHT ANTECUBITAL    Special Requests      BOTTLES DRAWN AEROBIC AND ANAEROBIC Blood Culture adequate volume   Culture      NO GROWTH < 24 HOURS Performed at [facility], [address] [place], [place] [address]    Report Status PENDING   Culture, blood (Routine X 2) w Reflex to ID Panel     Status: None (Preliminary result)   Collection Time: 02/14/20  8:57 PM   Specimen: BLOOD RIGHT HAND  Result Value Ref Range   Specimen Description BLOOD RIGHT HAND    Special Requests      BOTTLES DRAWN AEROBIC AND ANAEROBIC Blood Culture adequate volume   Culture      NO GROWTH < 24 HOURS Performed at [facility], [address] [place], [place] [address]    Report Status PENDING   HIV Antibody (routine testing w rflx)     Status: None   Collection Time: 02/14/20  8:57 PM  Result Value Ref Range   HIV Screen 4th Generation wRfx Non Reactive Non Reactive    Comment: Performed at [facility], [address] [place], [place] [address]  Lactic acid, plasma     Status: None   Collection Time: 02/14/20  8:57 PM  Result Value Ref Range   Lactic Acid, Venous 1.1 0.5 - 1.9 mmol/L    Comment: Performed at [facility], [address] [place], [place] [address]  Protime-INR  Status: None   Collection Time: 02/15/20 12:22 AM  Result Value Ref Range   Prothrombin Time  13.3 11.4 - 15.2 seconds   INR 1.1 0.8 - 1.2    Comment: (NOTE) INR goal varies based on device and disease states. Performed at [facility], [place] [address], [place], [place] [address]   CBC with Differential/Platelet     Status: Abnormal   Collection Time: 02/15/20  7:27 AM  Result Value Ref Range   WBC 5.6 4.0 - 10.5 K/uL   RBC 3.05 (L) 3.87 - 5.11 MIL/uL   Hemoglobin 8.7 (L) 12.0 - 15.0 g/dL   HCT 25.5 (L) 36.0 - 46.0 %   MCV 83.6 80.0 - 100.0 fL   MCH 28.5 26.0 - 34.0 pg   MCHC 34.1 30.0 - 36.0 g/dL   RDW 13.6 11.5 - 15.5 %   Platelets 197 150 - 400 K/uL   nRBC 0.0 0.0 - 0.2 %   Neutrophils Relative % 81 %   Neutro Abs 4.5 1.7 - 7.7 K/uL   Lymphocytes Relative 11 %   Lymphs Abs 0.6 (L) 0.7 - 4.0 K/uL   Monocytes Relative 5 %   Monocytes Absolute 0.3 0.1 - 1.0 K/uL   Eosinophils Relative 0 %   Eosinophils Absolute 0.0 0.0 - 0.5 K/uL   Basophils Relative 0 %   Basophils Absolute 0.0 0.0 - 0.1 K/uL   Immature Granulocytes 3 %   Abs Immature Granulocytes 0.19 (H) 0.00 - 0.07 K/uL    Comment: Performed at [place] [address] [place] [address], [place], [place] [address]  Comprehensive metabolic panel     Status: Abnormal   Collection Time: 02/15/20  7:27 AM  Result Value Ref Range   Sodium 135 135 - 145 mmol/L   Potassium 2.7 (LL) 3.5 - 5.1 mmol/L    Comment: CRITICAL RESULT CALLED TO, READ BACK BY AND VERIFIED WITH: C.FOUTES,RN 02/15/2020 0904 DAVISB    Chloride 102 98 - 111 mmol/L   CO2 21 (L) 22 - 32 mmol/L   Glucose, Bld 109 (H) 70 - 99 mg/dL    Comment: Glucose reference range applies only to samples taken after fasting for at least 8 hours.   BUN <5 (L) 6 - 20 mg/dL   Creatinine, Ser 0.53 0.44 - 1.00 mg/dL   Calcium 8.3 (L) 8.9 - 10.3 mg/dL   Total Protein 5.7 (L) 6.5 - 8.1 g/dL   Albumin 2.1 (L) 3.5 - 5.0 g/dL   AST 43 (H) 15 - 41 U/L   ALT 28 0 - 44 U/L   Alkaline Phosphatase 81 38 - 126 U/L   Total Bilirubin 1.3 (H) 0.3 - 1.2 mg/dL   GFR, Estimated >60  >60 mL/min    Comment: (NOTE) Calculated using the CKD-EPI Creatinine Equation (2021)    Anion gap 12 5 - 15    Comment: Performed at [facility], [place] [address], [place], [place] [address]  C-reactive protein     Status: Abnormal   Collection Time: 02/15/20  7:27 AM  Result Value Ref Range   CRP 8.0 (H) <1.0 mg/dL    Comment: Performed at [place] [address] [place], [place] [address]  D-dimer, quantitative (not at [facility])     Status: Abnormal   Collection Time: 02/15/20  7:27 AM  Result Value Ref Range   D-Dimer, Quant 1.72 (H) 0.00 - 0.50 ug/mL-FEU    Comment: (NOTE) At the manufacturer cut-off value  of 0.5 g/mL FEU, this assay has a negative predictive value of 95-100%.This assay is intended for use in conjunction with a clinical pretest probability (PTP) assessment model to exclude pulmonary embolism (PE) and deep venous thrombosis (DVT) in outpatients suspected of PE or DVT. Results should be correlated with clinical presentation. Performed at [facility], [place] [address], [place], [place] [address]   Heparin level (unfractionated)     Status: None   Collection Time: 02/15/20  7:27 AM  Result Value Ref Range   Heparin Unfractionated 0.51 0.30 - 0.70 IU/mL    Comment: (NOTE) If heparin results are below expected values, and patient dosage has  been confirmed, suggest follow up testing of antithrombin III levels. Performed at [facility], [place] [address], [place], [place] [address]   APTT     Status: Abnormal   Collection Time: 02/15/20  7:27 AM  Result Value Ref Range   aPTT 73 (H) 24 - 36 seconds    Comment:        IF BASELINE aPTT IS ELEVATED, SUGGEST PATIENT RISK ASSESSMENT BE USED TO DETERMINE APPROPRIATE ANTICOAGULANT THERAPY. Performed at [facility], [place] [address] [place], [place] [address]   Magnesium     Status: None   Collection Time: 02/15/20  7:27 AM  Result Value Ref Range   Magnesium 1.8 1.7 - 2.4 mg/dL     Comment: Performed at [place] [address] [place], [place] [address]  Blood gas, arterial     Status: Abnormal   Collection Time: 02/16/20  2:41 AM  Result Value Ref Range   FIO2 100.00    Mode NASAL CANNULA    pH, Arterial 7.442 7.350 - 7.450   pCO2 arterial 32.9 32.0 - 48.0 mmHg   pO2, Arterial 79.8 (L) 83.0 - 108.0 mmHg   Bicarbonate 22.1 20.0 - 28.0 mmol/L   Acid-base deficit 1.0 0.0 - 2.0 mmol/L   O2 Saturation 96.2 %   Sample type ARTERIAL   CBC with Differential/Platelet     Status: Abnormal   Collection Time: 02/16/20  3:50 AM  Result Value Ref Range   WBC 5.9 4.0 - 10.5 K/uL   RBC 2.94 (L) 3.87 - 5.11 MIL/uL   Hemoglobin 8.8 (L) 12.0 - 15.0 g/dL   HCT 24.5 (L) 36.0 - 46.0 %   MCV 83.3 80.0 - 100.0 fL   MCH 29.9 26.0 - 34.0 pg   MCHC 35.9 30.0 - 36.0 g/dL   RDW 13.8 11.5 - 15.5 %   Platelets 214 150 - 400 K/uL   nRBC 0.3 (H) 0.0 - 0.2 %   Neutrophils Relative % 80 %   Neutro Abs 4.7 1.7 - 7.7 K/uL   Lymphocytes Relative 9 %   Lymphs Abs 0.5 (L) 0.7 - 4.0 K/uL   Monocytes Relative 6 %   Monocytes Absolute 0.4 0.1 - 1.0 K/uL   Eosinophils Relative 0 %   Eosinophils Absolute 0.0 0.0 - 0.5 K/uL   Basophils Relative 0 %   Basophils Absolute 0.0 0.0 - 0.1 K/uL   Immature Granulocytes 5 %   Abs Immature Granulocytes 0.31 (H) 0.00 - 0.07 K/uL    Comment: Performed at [facility], [place] [address], [place], [place] [address]  Comprehensive metabolic panel     Status: Abnormal   Collection Time: 02/16/20  3:50 AM  Result Value Ref Range   Sodium 136 135 - 145 mmol/L   Potassium 2.9 (  L) 3.5 - 5.1 mmol/L   Chloride 104 98 - 111 mmol/L   CO2 20 (L) 22 - 32 mmol/L   Glucose, Bld 121 (H) 70 - 99 mg/dL    Comment: Glucose reference range applies only to samples taken after fasting for at least 8 hours.   BUN <5 (L) 6 - 20 mg/dL   Creatinine, Ser 0.49 0.44 - 1.00 mg/dL   Calcium 8.2 (L) 8.9 - 10.3 mg/dL   Total Protein 5.4 (L) 6.5 - 8.1 g/dL   Albumin  1.9 (L) 3.5 - 5.0 g/dL   AST 48 (H) 15 - 41 U/L   ALT 28 0 - 44 U/L   Alkaline Phosphatase 90 38 - 126 U/L   Total Bilirubin 0.8 0.3 - 1.2 mg/dL   GFR, Estimated >60 >60 mL/min    Comment: (NOTE) Calculated using the CKD-EPI Creatinine Equation (2021)    Anion gap 12 5 - 15    Comment: Performed at [facility], [place] [address], [place], [place] [address]  C-reactive protein     Status: Abnormal   Collection Time: 02/16/20  3:50 AM  Result Value Ref Range   CRP 7.8 (H) <1.0 mg/dL    Comment: Performed at [place] [address] [place], [place] [address]  D-dimer, quantitative (not at [facility])     Status: Abnormal   Collection Time: 02/16/20  3:50 AM  Result Value Ref Range   D-Dimer, Quant 1.19 (H) 0.00 - 0.50 ug/mL-FEU    Comment: (NOTE) At the manufacturer cut-off value of 0.5 g/mL FEU, this assay has a negative predictive value of 95-100%.This assay is intended for use in conjunction with a clinical pretest probability (PTP) assessment model to exclude pulmonary embolism (PE) and deep venous thrombosis (DVT) in outpatients suspected of PE or DVT. Results should be correlated with clinical presentation. Performed at [facility], [place] [address] [place], [place] [address]     CT ANGIO CHEST PE W OR WO CONTRAST  Result Date: 02/15/2020 CLINICAL DATA:  Shortness of breath, cough, COVID positive EXAM: CT ANGIOGRAPHY CHEST WITH CONTRAST TECHNIQUE: Multidetector CT imaging of the chest was performed using the standard protocol during bolus administration of intravenous contrast. Multiplanar CT image reconstructions and MIPs were obtained to evaluate the vascular anatomy. CONTRAST:  61mL OMNIPAQUE IOHEXOL 350 MG/ML SOLN COMPARISON:  01/28/2018 FINDINGS: Cardiovascular: Heart size normal. No pericardial effusion. Fair contrast opacification of pulmonary artery branches. No convincing filling defects to diagnose acute PE. Adequate contrast opacification of the  thoracic aorta with no evidence of dissection, aneurysm, or stenosis. There is classic 3-vessel brachiocephalic arch anatomy without proximal stenosis. Mediastinum/Nodes: Right hilar and subcarinal adenopathy, new since previous, presumably reactive. Central airways unremarkable. Lungs/Pleura: No pleural effusion. No pneumothorax. New scattered somewhat nodular bilateral airspace opacities with a predominantly perihilar distribution, involving bases more than upper lobes. Upper Abdomen: No acute abnormality. Musculoskeletal: No chest wall abnormality. No acute or significant osseous findings. Review of the MIP images confirms the above findings. IMPRESSION: 1. Negative for acute PE or thoracic aortic dissection. 2. New scattered somewhat nodular bilateral airspace opacities with a predominantly perihilar distribution, involving bases more than upper lobes. Favor infectious/inflammatory etiology given rapid progression. 3. Right hilar and subcarinal adenopathy, presumably reactive. Electronically Signed   By: Lucrezia Europe M.D.   On: 02/15/2020 13:48   VAS [place] LOWER EXTREMITY VENOUS (DVT)  Result Date: 02/15/2020  Lower Venous DVT Study Indications: Covid,elevated ddimer.  Limitations: Pain  tolerance. Comparison Study: no prior Performing Technologist: Blanch Media RVS  Examination Guidelines: A complete evaluation includes B-mode imaging, spectral Doppler, color Doppler, and power Doppler as needed of all accessible portions of each vessel. Bilateral testing is considered an integral part of a complete examination. Limited examinations for reoccurring indications may be performed as noted. The reflux portion of the exam is performed with the patient in reverse Trendelenburg.  +---------+---------------+---------+-----------+----------+-------------------+ RIGHT    CompressibilityPhasicitySpontaneityPropertiesThrombus Aging      +---------+---------------+---------+-----------+----------+-------------------+  CFV      Full           Yes      Yes                                      +---------+---------------+---------+-----------+----------+-------------------+ SFJ      Full                                                             +---------+---------------+---------+-----------+----------+-------------------+ FV Prox  Full                                                             +---------+---------------+---------+-----------+----------+-------------------+ FV Mid                  Yes      Yes                                      +---------+---------------+---------+-----------+----------+-------------------+ FV Distal               Yes      Yes                                      +---------+---------------+---------+-----------+----------+-------------------+ PFV      Full                                                             +---------+---------------+---------+-----------+----------+-------------------+ POP      Partial        Yes      Yes                                      +---------+---------------+---------+-----------+----------+-------------------+ PTV      Full                                                             +---------+---------------+---------+-----------+----------+-------------------+  PERO                                                  Not well visualized +---------+---------------+---------+-----------+----------+-------------------+   +---------+---------------+---------+-----------+----------+-------------------+ LEFT     CompressibilityPhasicitySpontaneityPropertiesThrombus Aging      +---------+---------------+---------+-----------+----------+-------------------+ CFV      Full           Yes      Yes                                      +---------+---------------+---------+-----------+----------+-------------------+ SFJ      Full                                                              +---------+---------------+---------+-----------+----------+-------------------+ FV Prox  Full                                                             +---------+---------------+---------+-----------+----------+-------------------+ FV Mid                  Yes      Yes                                      +---------+---------------+---------+-----------+----------+-------------------+ FV Distal               Yes      Yes                                      +---------+---------------+---------+-----------+----------+-------------------+ PFV      Full                                                             +---------+---------------+---------+-----------+----------+-------------------+ POP      Full           Yes      Yes                                      +---------+---------------+---------+-----------+----------+-------------------+ PTV      Full                                                             +---------+---------------+---------+-----------+----------+-------------------+ PERO  Not well visualized +---------+---------------+---------+-----------+----------+-------------------+     Summary: BILATERAL: - No evidence of deep vein thrombosis seen in the lower extremities, bilaterally. - No evidence of superficial venous thrombosis in the lower extremities, bilaterally. -No evidence of popliteal cyst, bilaterally.   *See table(s) above for measurements and observations. Electronically signed by Harold Barban MD on 02/15/2020 at 9:40:50 PM.    Final     Current scheduled medications . vitamin C  500 mg Oral Daily  . aspirin EC  81 mg Oral Daily  . docusate sodium  100 mg Oral Daily  . enoxaparin (LOVENOX) injection  50 mg Subcutaneous Q24H  . famotidine  20 mg Oral BID  . methylPREDNISolone (SOLU-MEDROL) injection  1 mg/kg Intravenous Q12H   Followed by  . [START ON 02/19/2020] predniSONE  50  mg Oral Daily  . ondansetron (ZOFRAN) IV  4 mg Intravenous Q6H  . potassium chloride  40 mEq Oral BID  . prenatal multivitamin  1 tablet Oral Q1200  . zinc sulfate  220 mg Oral Daily    I have reviewed the patient's current medications.  ASSESSMENT: Principal Problem:   Pneumonia due to COVID-19 virus Active Problems:   Dichorionic diamniotic twin gestation   Sepsis ([place])   Acute hypoxemic respiratory failure (H[facility])   Intractable nausea and vomiting   Diarrhea   [redacted] weeks gestation of pregnancy  PLAN: Appreciate TRH co-management, appreciate the recommendations Continue supplemental oxygen as needed to maintain SpO2 >95% Continue Remdesivir (Day 2) and Dexamethasone Continue prophylactic Lovenox for VTE prophylaxis Reassuring fetal status, no acute obstetric concerns. Daily dopplers for FHR checks. Continue routine antenatal care.   Verita Schneiders, MD, [place] for [employer], [place]

## 2020-02-16 NOTE — Progress Notes (Signed)
Patient ID: Misty Murillo, female   DOB: 10-19-1990, 30 y.o.   MRN: [id number] [place]) NOTE  ANALYSA Murillo is a 30 y.o. G3P1011 at [redacted]w[redacted]d by early ultrasound who is admitted for Covid 19 pneumonia.   Fetal presentation is unsure. Length of Stay:  2  Days  Subjective: Increasing SOB Patient reports the fetal movement as active. Patient reports uterine contraction  activity as none. Patient reports  vaginal bleeding as none. Patient describes fluid per vagina as None.  Vitals:  Blood pressure (!) 110/59, pulse (!) 109, temperature 98.5 F (36.9 C), temperature source Oral, resp. rate (!) 39, last menstrual period 09/25/2019, SpO2 94 %, unknown if currently breastfeeding. Physical Examination:  General appearance - alert, well appearing, and in no distress and ill-appearing Heart - normal rate and regular rhythm Abdomen - soft, nontender, nondistended Fundal Height:  consistent with twins Cervical Exam: Not evaluated.Extremities: extremities normal, atraumatic, no cyanosis or edema and Homans sign is negative, no sign of DVT  Fetal Monitoring:     Fetal Heart Rate A   Mode Doppler filed at 02/16/2020 0000  Baseline Rate (A) 168 bpm filed at 02/16/2020 0000  Multiple birth? Y filed at 02/14/2020 1609  Fetal Heart Rate Fetus B   Mode Doppler filed at 02/16/2020 0000  Baseline Rate (B) 170 BPM filed at 02/16/2020 0000     Labs:  Results for orders placed or performed during the hospital encounter of 02/14/20 (from the past 24 hour(s))  CBC with Differential/Platelet   Collection Time: 02/15/20  7:27 AM  Result Value Ref Range   WBC 5.6 4.0 - 10.5 K/uL   RBC 3.05 (L) 3.87 - 5.11 MIL/uL   Hemoglobin 8.7 (L) 12.0 - 15.0 g/dL   HCT 25.5 (L) 36.0 - 46.0 %   MCV 83.6 80.0 - 100.0 fL   MCH 28.5 26.0 - 34.0 pg   MCHC 34.1 30.0 - 36.0 g/dL   RDW 13.6 11.5 - 15.5 %   Platelets 197 150 - 400 K/uL   nRBC 0.0 0.0 - 0.2 %   Neutrophils Relative % 81 %    Neutro Abs 4.5 1.7 - 7.7 K/uL   Lymphocytes Relative 11 %   Lymphs Abs 0.6 (L) 0.7 - 4.0 K/uL   Monocytes Relative 5 %   Monocytes Absolute 0.3 0.1 - 1.0 K/uL   Eosinophils Relative 0 %   Eosinophils Absolute 0.0 0.0 - 0.5 K/uL   Basophils Relative 0 %   Basophils Absolute 0.0 0.0 - 0.1 K/uL   Immature Granulocytes 3 %   Abs Immature Granulocytes 0.19 (H) 0.00 - 0.07 K/uL  Comprehensive metabolic panel   Collection Time: 02/15/20  7:27 AM  Result Value Ref Range   Sodium 135 135 - 145 mmol/L   Potassium 2.7 (LL) 3.5 - 5.1 mmol/L   Chloride 102 98 - 111 mmol/L   CO2 21 (L) 22 - 32 mmol/L   Glucose, Bld 109 (H) 70 - 99 mg/dL   BUN <5 (L) 6 - 20 mg/dL   Creatinine, Ser 0.53 0.44 - 1.00 mg/dL   Calcium 8.3 (L) 8.9 - 10.3 mg/dL   Total Protein 5.7 (L) 6.5 - 8.1 g/dL   Albumin 2.1 (L) 3.5 - 5.0 g/dL   AST 43 (H) 15 - 41 U/L   ALT 28 0 - 44 U/L   Alkaline Phosphatase 81 38 - 126 U/L   Total Bilirubin 1.3 (H) 0.3 - 1.2 mg/dL   GFR, Estimated >60 >  60 mL/min   Anion gap 12 5 - 15  C-reactive protein   Collection Time: 02/15/20  7:27 AM  Result Value Ref Range   CRP 8.0 (H) <1.0 mg/dL  D-dimer, quantitative (not at [facility])   Collection Time: 02/15/20  7:27 AM  Result Value Ref Range   D-Dimer, Quant 1.72 (H) 0.00 - 0.50 ug/mL-FEU  Heparin level (unfractionated)   Collection Time: 02/15/20  7:27 AM  Result Value Ref Range   Heparin Unfractionated 0.51 0.30 - 0.70 IU/mL  APTT   Collection Time: 02/15/20  7:27 AM  Result Value Ref Range   aPTT 73 (H) 24 - 36 seconds  Magnesium   Collection Time: 02/15/20  7:27 AM  Result Value Ref Range   Magnesium 1.8 1.7 - 2.4 mg/dL     Medications:  Scheduled . vitamin C  500 mg Oral Daily  . aspirin EC  81 mg Oral Daily  . dexamethasone (DECADRON) injection  6 mg Intravenous Q24H  . docusate sodium  100 mg Oral Daily  . enoxaparin (LOVENOX) injection  50 mg Subcutaneous Q24H  . famotidine  20 mg Oral BID  . ondansetron (ZOFRAN) IV  4  mg Intravenous Q6H  . potassium chloride  40 mEq Oral BID  . prenatal multivitamin  1 tablet Oral Q1200  . zinc sulfate  220 mg Oral Daily   I have reviewed the patient's current medications.  ASSESSMENT: Patient Active Problem List   Diagnosis Date Noted  . [redacted] weeks gestation of pregnancy 02/15/2020  . Pneumonia due to COVID-19 virus 02/14/2020  . Sepsis ([place]) 02/14/2020  . Acute hypoxemic respiratory failure ([place]) 02/14/2020  . Intractable nausea and vomiting 02/14/2020  . Diarrhea 02/14/2020  . Hypokalemia 01/17/2020  . Supervision of high risk pregnancy, antepartum 12/12/2019  . Fibroid   . Dichorionic diamniotic twin gestation 07/2019  . Obesity in pregnancy 07/2019  . Graves' disease 08/03/2018  . Hyperthyroidism 03/08/2018  . Vitamin D deficiency 03/08/2018  . Chronic urticaria 07/11/2017  . Angioedema 07/11/2017  . Sickle cell trait ([place]) 05/26/2017  . Liver mass, left lobe 05/18/2017    PLAN: Patient O2 sat 94 on6 L [place], was 89% on 3 L, will notify hospitalist on call  Emeterio Reeve 02/16/2020,1:53 AM

## 2020-02-16 NOTE — Progress Notes (Signed)
HOSPITAL MEDICINE OVERNIGHT EVENT NOTE    Notified by nursing the patient is exhibiting significant shortness of breath and tachypnea with a respiratory rate upwards of 30 times per minute.  Patient is also exhibiting oxygen saturations in the upper 80s to low 90s.  Patient was evaluated by Dr. Ouida Sills with OB/GYN who evaluated the patient at the bedside.  Patient wants placed on increasing amounts of oxygen via nasal cannula and is currently on 6 L/min improving with oxygen saturations now between 93 and 95%.  Per my discussion with nursing, patient is continued to exhibit lethargy and is arousable after some prompting seems very slow to answer.  I recommended continue to titrate supplemental oxygen to maintain target oxygen saturation of 94%.  We will also obtain an ABG to assure patient is not developing significant hypercapnia as a cause of her lethargy.  Medications reviewed, patient does not seem to be on any sedating agents.  Will follow up ABG.  Vernelle Emerald  MD [facility]

## 2020-02-16 NOTE — Progress Notes (Signed)
PROGRESS NOTE    Misty Murillo  WHQ:[id number] DOB: 07/27/1990 DOA: 02/14/2020 PCP: Orma Flaming, MD    Brief Narrative:  30 y.o. female [phone] '@[redacted]w[redacted]d'  gestation with twins, Graves' disease, depression, anxiety, history of PID presented to [facility]U complaining of symptoms of an acute viral illness.  She tested positive for COVID-19 on 1/21 during ED visit and then returned on 1/23 and was diagnosed with Covid pneumonia.  On arrival to MAU today, patient was febrile, tachycardic, tachypneic, and SPO2 80% on room air when supine.  Not hypotensive.  WBC 6.6, hemoglobin 10.2 (stable compared to recent labs), platelet count 220K.  Sodium 131, potassium 4.3, chloride 99, bicarb 20, anion gap 12, BUN <5, creatinine 0.6, glucose 112.  AST 82, ALT 36, alk phos 93, T bili 2.2.  Lipase normal. She was given Tylenol, Zofran, and 1 L LR bolus.  Patient is not vaccinated against Covid. She reports 1 week history of body aches, fatigue, cough, shortness of breath, nausea, vomiting, and diarrhea. States she is not able to tolerate any p.o. intake. She is also having discomfort in her lower abdomen. States she has Graves' disease for which she is seen by endocrinologist Dr. Dwyane Dee and currently not on any treatments as her thyroid function labs were normal during recent office visit and her endocrinologist told her he will continue to monitor her labs periodically. No other complaints.  Assessment & Plan:   Principal Problem:   Pneumonia due to COVID-19 virus Active Problems:   Dichorionic diamniotic twin gestation   Sepsis ([place])   Acute hypoxemic respiratory failure ([facility])   Intractable nausea and vomiting   Diarrhea   [redacted] weeks gestation of pregnancy  Sepsis and acute hypoxemic respiratory failure secondary to COVID-19 viral pneumonia: Meets criteria for sepsis -3 SIRS (fever, tachycardia, tachypnea) and SARS-CoV-2 PCR test positive on 02/10/2020.  No hypotension to suggest severe sepsis.   -Chest x-ray done  02/12/2020 showing new focal consolidation in the right midlung.  . -cont with Remdesivir and steroids -Overnight noted to have increased O2 demands, now needing 4LNC -Will change Decadron to solumedrol 47m/kg bid -Chest CT without PE -CRP is down peaked to 8.0, now down to 7.8 today -Will repeat inflammatory markers in AM  Intractable nausea and vomiting Diarrhea Suspect symptoms are due to COVID-19 viral gastroenteritis.  AST 82, ALT 36, alk phos 93, T bili 2.2.  Lipase normal.  Mild elevation of LFTs likely due to COVID-19 viral infection.   -Stable when seen today  Mild hyponatremia: Likely due to poor oral intake/vomiting/diarrhea.  Sodium normalized -continued to encourage hydration -recheck bmet in AM  Mild normal anion gap metabolic acidosis: Likely due to diarrhea.  Bicarb 20, anion gap 12. -Continue to encourage hydration  Tachycardia: TSH and free T4 checked on 01/31/2020 were normal. -Cont with heart monitor as needed  History of Graves' disease: Patient is followed by endocrinology and recent thyroid function tests were normal. -Recommend f/u with Endocrine as outpatient   Pregnancy (260w2destation with twins) -Management per primary team.  Hypokalemia -K of 2.9, recent Mg of 1.8 -Potassium is being replaced -Will repeat bmet in AM, cont to replace as needed, goal K of >4  DVT prophylaxis: Lovenox subq Code Status: Full Family Communication: Pt in room, family at bedside  Status is: Inpatient  Remains inpatient appropriate because:Unsafe d/c plan, IV treatments appropriate due to intensity of illness or inability to take PO and Inpatient level of care appropriate due to severity of illness  Dispo: The patient is from: Home              Anticipated d/c is to: Home              Anticipated d/c date is: 3 days              Patient currently is not medically stable to d/c.   Difficult to place patient No   Consultants:     Procedures:      Antimicrobials: Anti-infectives (From admission, onward)   Start     Dose/Rate Route Frequency Ordered Stop   02/15/20 1000  remdesivir 100 mg in sodium chloride 0.9 % 100 mL IVPB       "Followed by" Linked Group Details   100 mg 200 mL/hr over 30 Minutes Intravenous Daily 02/14/20 2034 02/19/20 0959   02/14/20 2130  remdesivir 200 mg in sodium chloride 0.9% 250 mL IVPB       "Followed by" Linked Group Details   200 mg 580 mL/hr over 30 Minutes Intravenous Once 02/14/20 2034 02/14/20 2202      Subjective: Reports feeling generally weak  Objective: Vitals:   02/16/20 1559 02/16/20 1600 02/16/20 1605 02/16/20 1610  BP: (!) 105/58     Pulse: (!) 101     Resp: (!) 23     Temp: 98.1 F (36.7 C)     TempSrc: Oral     SpO2:  93% 94% 93%    Intake/Output Summary (Last 24 hours) at 02/16/2020 1720 Last data filed at 02/16/2020 1400 Gross per 24 hour  Intake --  Output 1200 ml  Net -1200 ml   There were no vitals filed for this visit.  Examination: General exam: Awake, laying in bed, in nad Respiratory system: Normal respiratory effort, no audible wheezing Cardiovascular system: perfused, no notable jvd Gastrointestinal system: Soft, nondistended, positive BS Central nervous system: CN2-12 grossly intact, strength intact Extremities: Perfused, no clubbing Skin: Normal skin turgor, no notable skin lesions seen Psychiatry: Mood normal // no visual hallucinations   Data Reviewed: I have personally reviewed following labs and imaging studies  CBC: Recent Labs  Lab 02/10/20 0536 02/12/20 0937 02/14/20 1655 02/15/20 0727 02/16/20 0350  WBC 6.5 6.1 6.6 5.6 5.9  NEUTROABS 5.4 5.1  --  4.5 4.7  HGB 10.3* 10.7* 10.2* 8.7* 8.8*  HCT 29.8* 30.2* 29.6* 25.5* 24.5*  MCV 85.9 84.6 83.9 83.6 83.3  PLT 264 245 220 197 563   Basic Metabolic Panel: Recent Labs  Lab 02/10/20 0536 02/12/20 0937 02/14/20 1655 02/15/20 0727 02/16/20 0350  NA 131* 131* 131* 135 136  K  3.1* 2.8* 4.3 2.7* 2.9*  CL 101 102 99 102 104  CO2 20* 20* 20* 21* 20*  GLUCOSE 109* 114* 112* 109* 121*  BUN <5* <5* <5* <5* <5*  CREATININE 0.54 0.52 0.61 0.53 0.49  CALCIUM 8.6* 8.3* 8.5* 8.3* 8.2*  MG  --  2.1  --  1.8  --    GFR: Estimated Creatinine Clearance: 122 mL/min (by C-G formula based on SCr of 0.49 mg/dL). Liver Function Tests: Recent Labs  Lab 02/10/20 0536 02/14/20 1655 02/15/20 0727 02/16/20 0350  AST 39 82* 43* 48*  ALT 35 36 28 28  ALKPHOS 96 93 81 90  BILITOT 0.4 2.2* 1.3* 0.8  PROT 7.0 6.3* 5.7* 5.4*  ALBUMIN 2.9* 2.4* 2.1* 1.9*   Recent Labs  Lab 02/14/20 1655  LIPASE 21   No results for input(s): AMMONIA in the last  168 hours. Coagulation Profile: Recent Labs  Lab 02/15/20 0022  INR 1.1   Cardiac Enzymes: No results for input(s): CKTOTAL, CKMB, CKMBINDEX, TROPONINI in the last 168 hours. BNP (last 3 results) No results for input(s): PROBNP in the last 8760 hours. HbA1C: No results for input(s): HGBA1C in the last 72 hours. CBG: No results for input(s): GLUCAP in the last 168 hours. Lipid Profile: Recent Labs    02/14/20 2057  TRIG 197*   Thyroid Function Tests: No results for input(s): TSH, T4TOTAL, FREET4, T3FREE, THYROIDAB in the last 72 hours. Anemia Panel: Recent Labs    02/14/20 2057  FERRITIN 150   Sepsis Labs: Recent Labs  Lab 02/10/20 0536 02/10/20 0818 02/14/20 2057  PROCALCITON  --   --  0.21  LATICACIDVEN 1.1 1.4 1.1    Recent Results (from the past 240 hour(s))  SARS Coronavirus 2 by RT PCR (hospital order, performed in [facility] hospital lab) Nasopharyngeal Nasopharyngeal Swab     Status: Abnormal   Collection Time: 02/10/20  4:42 AM   Specimen: Nasopharyngeal Swab  Result Value Ref Range Status   SARS Coronavirus 2 POSITIVE (A) NEGATIVE Final    Comment: RESULT CALLED TO, READ BACK BY AND VERIFIED WITH: Sharyon Cable AT 4174 ON 02/10/20 BY HUFFINES,S. (NOTE) SARS-CoV-2 target nucleic acids are  DETECTED  SARS-CoV-2 RNA is generally detectable in upper respiratory specimens  during the acute phase of infection.  Positive results are indicative  of the presence of the identified virus, but do not rule out bacterial infection or co-infection with other pathogens not detected by the test.  Clinical correlation with patient history and  other diagnostic information is necessary to determine patient infection status.  The expected result is negative.  Fact Sheet for Patients:   StrictlyIdeas.no   Fact Sheet for Healthcare Providers:   BankingDealers.co.za    This test is not yet approved or cleared by the [place] FDA and  has been authorized for detection and/or diagnosis of SARS-CoV-2 by FDA under an Emergency Use Authorization (EUA).  This EUA will remain in effect (meanin g this test can be used) for the duration of  the COVID-19 declaration under Section 564(b)(1) of the Act, 21 U.S.C. section 360-bbb-3(b)(1), unless the authorization is terminated or revoked sooner.  Performed at [facility], [address], [place], [place] [address]   Culture, blood (Routine X 2) w Reflex to ID Panel     Status: None (Preliminary result)   Collection Time: 02/14/20  8:57 PM   Specimen: BLOOD  Result Value Ref Range Status   Specimen Description BLOOD RIGHT ANTECUBITAL  Final   Special Requests   Final    BOTTLES DRAWN AEROBIC AND ANAEROBIC Blood Culture adequate volume   Culture   Final    NO GROWTH 2 DAYS Performed at [facility], [place] [address], [place], [place] [address]    Report Status PENDING  Incomplete  Culture, blood (Routine X 2) w Reflex to ID Panel     Status: None (Preliminary result)   Collection Time: 02/14/20  8:57 PM   Specimen: BLOOD RIGHT HAND  Result Value Ref Range Status   Specimen Description BLOOD RIGHT HAND  Final   Special Requests   Final    BOTTLES DRAWN AEROBIC AND ANAEROBIC Blood Culture  adequate volume   Culture   Final    NO GROWTH 2 DAYS Performed at [facility], [place] [address], [place], [place] [address]  Report Status PENDING  Incomplete     Radiology Studies: CT ANGIO CHEST PE W OR WO CONTRAST  Result Date: 02/15/2020 CLINICAL DATA:  Shortness of breath, cough, COVID positive EXAM: CT ANGIOGRAPHY CHEST WITH CONTRAST TECHNIQUE: Multidetector CT imaging of the chest was performed using the standard protocol during bolus administration of intravenous contrast. Multiplanar CT image reconstructions and MIPs were obtained to evaluate the vascular anatomy. CONTRAST:  42m OMNIPAQUE IOHEXOL 350 MG/ML SOLN COMPARISON:  01/28/2018 FINDINGS: Cardiovascular: Heart size normal. No pericardial effusion. Fair contrast opacification of pulmonary artery branches. No convincing filling defects to diagnose acute PE. Adequate contrast opacification of the thoracic aorta with no evidence of dissection, aneurysm, or stenosis. There is classic 3-vessel brachiocephalic arch anatomy without proximal stenosis. Mediastinum/Nodes: Right hilar and subcarinal adenopathy, new since previous, presumably reactive. Central airways unremarkable. Lungs/Pleura: No pleural effusion. No pneumothorax. New scattered somewhat nodular bilateral airspace opacities with a predominantly perihilar distribution, involving bases more than upper lobes. Upper Abdomen: No acute abnormality. Musculoskeletal: No chest wall abnormality. No acute or significant osseous findings. Review of the MIP images confirms the above findings. IMPRESSION: 1. Negative for acute PE or thoracic aortic dissection. 2. New scattered somewhat nodular bilateral airspace opacities with a predominantly perihilar distribution, involving bases more than upper lobes. Favor infectious/inflammatory etiology given rapid progression. 3. Right hilar and subcarinal adenopathy, presumably reactive. Electronically Signed   By: DLucrezia EuropeM.D.   On: 02/15/2020  13:48   VAS [place]OWER EXTREMITY VENOUS (DVT)  Result Date: 02/15/2020  Lower Venous DVT Study Indications: Covid,elevated ddimer.  Limitations: Pain tolerance. Comparison Study: no prior Performing Technologist: MAbram SanderRVS  Examination Guidelines: A complete evaluation includes B-mode imaging, spectral Doppler, color Doppler, and power Doppler as needed of all accessible portions of each vessel. Bilateral testing is considered an integral part of a complete examination. Limited examinations for reoccurring indications may be performed as noted. The reflux portion of the exam is performed with the patient in reverse Trendelenburg.  +---------+---------------+---------+-----------+----------+-------------------+ RIGHT    CompressibilityPhasicitySpontaneityPropertiesThrombus Aging      +---------+---------------+---------+-----------+----------+-------------------+ CFV      Full           Yes      Yes                                      +---------+---------------+---------+-----------+----------+-------------------+ SFJ      Full                                                             +---------+---------------+---------+-----------+----------+-------------------+ FV Prox  Full                                                             +---------+---------------+---------+-----------+----------+-------------------+ FV Mid                  Yes      Yes                                      +---------+---------------+---------+-----------+----------+-------------------+  FV Distal               Yes      Yes                                      +---------+---------------+---------+-----------+----------+-------------------+ PFV      Full                                                             +---------+---------------+---------+-----------+----------+-------------------+ POP      Partial        Yes      Yes                                       +---------+---------------+---------+-----------+----------+-------------------+ PTV      Full                                                             +---------+---------------+---------+-----------+----------+-------------------+ PERO                                                  Not well visualized +---------+---------------+---------+-----------+----------+-------------------+   +---------+---------------+---------+-----------+----------+-------------------+ LEFT     CompressibilityPhasicitySpontaneityPropertiesThrombus Aging      +---------+---------------+---------+-----------+----------+-------------------+ CFV      Full           Yes      Yes                                      +---------+---------------+---------+-----------+----------+-------------------+ SFJ      Full                                                             +---------+---------------+---------+-----------+----------+-------------------+ FV Prox  Full                                                             +---------+---------------+---------+-----------+----------+-------------------+ FV Mid                  Yes      Yes                                      +---------+---------------+---------+-----------+----------+-------------------+ FV Distal  Yes      Yes                                      +---------+---------------+---------+-----------+----------+-------------------+ PFV      Full                                                             +---------+---------------+---------+-----------+----------+-------------------+ POP      Full           Yes      Yes                                      +---------+---------------+---------+-----------+----------+-------------------+ PTV      Full                                                             +---------+---------------+---------+-----------+----------+-------------------+  PERO                                                  Not well visualized +---------+---------------+---------+-----------+----------+-------------------+     Summary: BILATERAL: - No evidence of deep vein thrombosis seen in the lower extremities, bilaterally. - No evidence of superficial venous thrombosis in the lower extremities, bilaterally. -No evidence of popliteal cyst, bilaterally.   *See table(s) above for measurements and observations. Electronically signed by Harold Barban MD on 02/15/2020 at 9:40:50 PM.    Final     Scheduled Meds: . vitamin C  500 mg Oral Daily  . aspirin EC  81 mg Oral Daily  . docusate sodium  100 mg Oral Daily  . enoxaparin (LOVENOX) injection  50 mg Subcutaneous Q24H  . famotidine  20 mg Oral BID  . methylPREDNISolone (SOLU-MEDROL) injection  1 mg/kg Intravenous Q12H   Followed by  . [START ON 02/19/2020] predniSONE  50 mg Oral Daily  . ondansetron (ZOFRAN) IV  4 mg Intravenous Q6H  . potassium chloride  40 mEq Oral BID  . prenatal multivitamin  1 tablet Oral Q1200  . zinc sulfate  220 mg Oral Daily   Continuous Infusions: . lactated ringers 75 mL/hr at 02/16/20 0440  . remdesivir 100 mg in NS 100 mL 100 mg (02/16/20 1109)   Time spent:29mn   LOS: 2 days   SMarylu Lund MD [facility] Pager On Amion  If 7PM-7AM, please contact night-coverage 02/16/2020, 5:20 PM

## 2020-02-16 NOTE — Progress Notes (Signed)
At 23:30 RN offered pt. scheduled PO potassium, pt. not able to tolerate PO medication at this time. Upon assessment pt. SPO2 between 89-91, RR 39;  Dr. Roselie Awkward notified.Ordered to keep pt on 4L [place] PRN to maintain 95% SPO2. At 0058 SPO2 back to 89-91. Dr. Roselie Awkward notified ordered to give up to 6L [place] PRN to maintain 95% SPO2.

## 2020-02-17 ENCOUNTER — Encounter (HOSPITAL_COMMUNITY): Payer: Self-pay | Admitting: Obstetrics and Gynecology

## 2020-02-17 LAB — BASIC METABOLIC PANEL
Anion gap: 12 (ref 5–15)
BUN: <5 mg/dL — ABNORMAL LOW (ref 6–20)
CO2: 20 mmol/L — ABNORMAL LOW (ref 22–32)
Calcium: 8.3 mg/dL — ABNORMAL LOW (ref 8.9–10.3)
Chloride: 104 mmol/L (ref 98–111)
Creatinine, Ser: 0.52 mg/dL (ref 0.44–1.00)
GFR, Estimated: >60 mL/min (ref >60)
Glucose, Bld: 146 mg/dL — ABNORMAL HIGH (ref 70–99)
Potassium: 2.9 mmol/L — ABNORMAL LOW (ref 3.5–5.1)
Sodium: 136 mmol/L (ref 135–145)

## 2020-02-17 LAB — CBC WITH DIFFERENTIAL/PLATELET
Abs Immature Granulocytes: 0.38 10*3/uL — ABNORMAL HIGH (ref 0.00–0.07)
Basophils Absolute: 0 10*3/uL (ref 0.0–0.1)
Basophils Relative: 0 %
Eosinophils Absolute: 0 10*3/uL (ref 0.0–0.5)
Eosinophils Relative: 0 %
HCT: 26.8 % — ABNORMAL LOW (ref 36.0–46.0)
Hemoglobin: 9.5 g/dL — ABNORMAL LOW (ref 12.0–15.0)
Immature Granulocytes: 6 %
Lymphocytes Relative: 11 %
Lymphs Abs: 0.7 10*3/uL (ref 0.7–4.0)
MCH: 29.7 pg (ref 26.0–34.0)
MCHC: 35.4 g/dL (ref 30.0–36.0)
MCV: 83.8 fL (ref 80.0–100.0)
Monocytes Absolute: 0.6 10*3/uL (ref 0.1–1.0)
Monocytes Relative: 9 %
Neutro Abs: 4.9 10*3/uL (ref 1.7–7.7)
Neutrophils Relative %: 74 %
Platelets: 296 10*3/uL (ref 150–400)
RBC: 3.2 MIL/uL — ABNORMAL LOW (ref 3.87–5.11)
RDW: 13.9 % (ref 11.5–15.5)
WBC: 6.6 10*3/uL (ref 4.0–10.5)
nRBC: 0.5 % — ABNORMAL HIGH (ref 0.0–0.2)

## 2020-02-17 LAB — COMPREHENSIVE METABOLIC PANEL
ALT: 31 U/L (ref 0–44)
AST: 55 U/L — ABNORMAL HIGH (ref 15–41)
Albumin: 2.1 g/dL — ABNORMAL LOW (ref 3.5–5.0)
Alkaline Phosphatase: 103 U/L (ref 38–126)
Anion gap: 13 (ref 5–15)
BUN: <5 mg/dL — ABNORMAL LOW (ref 6–20)
CO2: 20 mmol/L — ABNORMAL LOW (ref 22–32)
Calcium: 8.5 mg/dL — ABNORMAL LOW (ref 8.9–10.3)
Chloride: 103 mmol/L (ref 98–111)
Creatinine, Ser: 0.52 mg/dL (ref 0.44–1.00)
GFR, Estimated: >60 mL/min (ref >60)
Glucose, Bld: 125 mg/dL — ABNORMAL HIGH (ref 70–99)
Potassium: 2.9 mmol/L — ABNORMAL LOW (ref 3.5–5.1)
Sodium: 136 mmol/L (ref 135–145)
Total Bilirubin: 1.7 mg/dL — ABNORMAL HIGH (ref 0.3–1.2)
Total Protein: 5.9 g/dL — ABNORMAL LOW (ref 6.5–8.1)

## 2020-02-17 LAB — D-DIMER, QUANTITATIVE: D-Dimer, Quant: 1.12 ug/mL-FEU — ABNORMAL HIGH (ref 0.00–0.50)

## 2020-02-17 LAB — C-REACTIVE PROTEIN: CRP: 4.3 mg/dL — ABNORMAL HIGH (ref <1.0)

## 2020-02-17 MED ORDER — POTASSIUM CHLORIDE CRYS ER 20 MEQ PO TBCR
60.0000 meq | EXTENDED_RELEASE_TABLET | Freq: Two times a day (BID) | ORAL | Status: DC
  Administered 2020-02-17: 60 meq via ORAL
  Filled 2020-02-17: qty 3

## 2020-02-17 MED ORDER — IPRATROPIUM-ALBUTEROL 0.5-2.5 (3) MG/3ML IN SOLN
3.0000 mL | Freq: Once | RESPIRATORY_TRACT | Status: DC
  Filled 2020-02-17: qty 3

## 2020-02-17 MED ORDER — COMPLETENATE 29-1 MG PO CHEW
1.0000 | CHEWABLE_TABLET | Freq: Every day | ORAL | Status: DC
  Administered 2020-02-17 – 2020-02-21 (×5): 1 via ORAL
  Filled 2020-02-17 (×5): qty 1

## 2020-02-17 NOTE — Progress Notes (Signed)
PROGRESS NOTE    Misty Murillo  UXN:[id number] DOB: 1990-06-09 DOA: 02/14/2020 PCP: Orma Flaming, MD    Brief Narrative:  30 y.o. female [phone] _0  gestation with twins, Graves' disease, depression, anxiety, history of PID presented to [facility] complaining of symptoms of an acute viral illness.  She tested positive for COVID-19 on 1/21 during ED visit and then returned on 1/23 and was diagnosed with Covid pneumonia.  On arrival to [facility]U today, patient was febrile, tachycardic, tachypneic, and SPO2 80% on room air when supine.  Not hypotensive.  WBC 6.6, hemoglobin 10.2 (stable compared to recent labs), platelet count 220K.  Sodium 131, potassium 4.3, chloride 99, bicarb 20, anion gap 12, BUN <5, creatinine 0.6, glucose 112.  AST 82, ALT 36, alk phos 93, T bili 2.2.  Lipase normal. She was given Tylenol, Zofran, and 1 L LR bolus.  Patient is not vaccinated against Covid. She reports 1 week history of body aches, fatigue, cough, shortness of breath, nausea, vomiting, and diarrhea. States she is not able to tolerate any p.o. intake. She is also having discomfort in her lower abdomen. States she has Graves' disease for which she is seen by endocrinologist Dr. Dwyane Dee and currently not on any treatments as her thyroid function labs were normal during recent office visit and her endocrinologist told her he will continue to monitor her labs periodically. No other complaints.  Assessment & Plan:   Principal Problem:   Pneumonia due to COVID-19 virus Active Problems:   Dichorionic diamniotic twin gestation   Sepsis ([place])   Acute hypoxemic respiratory failure (HCC)   Intractable nausea and vomiting   Diarrhea   [redacted] weeks gestation of pregnancy  Sepsis and acute hypoxemic respiratory failure secondary to COVID-19 viral pneumonia: Meets criteria for sepsis -3 SIRS (fever, tachycardia, tachypnea) and SARS-CoV-2 PCR test positive on 02/10/2020.  No hypotension to suggest severe sepsis.   -Chest x-ray done  02/12/2020 showing new focal consolidation in the right midlung.  . -cont with Remdesivir and steroids -Overnight noted to have increased O2 demands, needing up to 11L overnight, down to 5L with use of IS -Chest CT without PE -CRP is down peaked to 8.0, now down to 4.3today -Will repeat inflammatory markers in AM -If patient's O2 requirements worsen again, would recommend proning patient. Discussed with gyn attending, that it is OK to prone pt if needed -Encourage pt to continue using IS as much as possible -If O2 requirements continue to worsen despite conservative measures, may need to consider trial of actemra   Intractable nausea and vomiting Diarrhea Suspect symptoms are due to COVID-19 viral gastroenteritis.  AST 82, ALT 36, alk phos 93, T bili 2.2.  Lipase normal.  Mild elevation of LFTs likely due to COVID-19 viral infection.   -remains stable when seen today  Mild hyponatremia: Likely due to poor oral intake/vomiting/diarrhea.  Sodium normalized -continued to encourage hydration -repeat bmet in AM  Mild normal anion gap metabolic acidosis: Likely due to diarrhea.  Bicarb 20, anion gap 12. -Continue to encourage hydration  Tachycardia: TSH and free T4 checked on 01/31/2020 were normal. -Cont with heart monitor as needed  History of Graves' disease: Patient is followed by endocrinology and recent thyroid function tests were normal. -Recommend f/u with Endocrine as outpatient   Pregnancy (49w2dgestation with twins) -Management per primary team.  Hypokalemia -K of 2.9, recent Mg of 1.8 -repeat K remains 2.9 today -Will give additional dose of K tonight, repeat bmet in AM  DVT  prophylaxis: Lovenox subq Code Status: Full Family Communication: Pt in room, family at bedside  Status is: Inpatient  Remains inpatient appropriate because:Unsafe d/c plan, IV treatments appropriate due to intensity of illness or inability to take PO and Inpatient level of care appropriate  due to severity of illness   Dispo: The patient is from: Home              Anticipated d/c is to: Home              Anticipated d/c date is: 3 days              Patient currently is not medically stable to d/c.   Difficult to place patient No   Consultants:     Procedures:     Antimicrobials: Anti-infectives (From admission, onward)   Start     Dose/Rate Route Frequency Ordered Stop   02/15/20 1000  remdesivir 100 mg in sodium chloride 0.9 % 100 mL IVPB       "Followed by" Linked Group Details   100 mg 200 mL/hr over 30 Minutes Intravenous Daily 02/14/20 2034 02/19/20 0959   02/14/20 2130  remdesivir 200 mg in sodium chloride 0.9% 250 mL IVPB       "Followed by" Linked Group Details   200 mg 580 mL/hr over 30 Minutes Intravenous Once 02/14/20 2034 02/14/20 2202      Subjective: Feeling weak generally  Objective: Vitals:   02/17/20 1100 02/17/20 1137 02/17/20 1625 02/17/20 1659  BP:  (!) 105/54    Pulse:  (!) 103    Resp:  (!) 36    Temp:  98.1 F (36.7 C)    TempSrc:  Oral    SpO2: 93% 96% 97% 100%    Intake/Output Summary (Last 24 hours) at 02/17/2020 1828 Last data filed at 02/17/2020 1600 Gross per 24 hour  Intake --  Output 550 ml  Net -550 ml   There were no vitals filed for this visit.  Examination: General exam: Conversant, in no acute distress Respiratory system: normal chest rise, clear, no audible wheezing Cardiovascular system: regular rhythm, s1-s2 Gastrointestinal system: Nondistended, nontender, pos BS Central nervous system: No seizures, no tremors Extremities: No cyanosis, no joint deformities Skin: No rashes, no pallor Psychiatry: Affect normal // no auditory hallucinations   Data Reviewed: I have personally reviewed following labs and imaging studies  CBC: Recent Labs  Lab 02/12/20 0937 02/14/20 1655 02/15/20 0727 02/16/20 0350 02/17/20 0733  WBC 6.1 6.6 5.6 5.9 6.6  NEUTROABS 5.1  --  4.5 4.7 4.9  HGB 10.7* 10.2* 8.7* 8.8*  9.5*  HCT 30.2* 29.6* 25.5* 24.5* 26.8*  MCV 84.6 83.9 83.6 83.3 83.8  PLT 245 220 197 214 932   Basic Metabolic Panel: Recent Labs  Lab 02/12/20 0937 02/14/20 1655 02/15/20 0727 02/16/20 0350 02/17/20 0733 02/17/20 1558  NA 131* 131* 135 136 136 136  K 2.8* 4.3 2.7* 2.9* 2.9* 2.9*  CL 102 99 102 104 103 104  CO2 20* 20* 21* 20* 20* 20*  GLUCOSE 114* 112* 109* 121* 125* 146*  BUN <5* <5* <5* <5* <5* <5*  CREATININE 0.52 0.61 0.53 0.49 0.52 0.52  CALCIUM 8.3* 8.5* 8.3* 8.2* 8.5* 8.3*  MG 2.1  --  1.8  --   --   --    GFR: Estimated Creatinine Clearance: 122 mL/min (by C-G formula based on SCr of 0.52 mg/dL). Liver Function Tests: Recent Labs  Lab 02/14/20 1655 02/15/20 0727 02/16/20 0350  02/17/20 0733  AST 82* 43* 48* 55*  ALT 36 _0 ALKPHOS 93 81 90 103  BILITOT 2.2* 1.3* 0.8 1.7*  PROT 6.3* 5.7* 5.4* 5.9*  ALBUMIN 2.4* 2.1* 1.9* 2.1*   Recent Labs  Lab 02/14/20 1655  LIPASE 21   No results for input(s): AMMONIA in the last 168 hours. Coagulation Profile: Recent Labs  Lab 02/15/20 0022  INR 1.1   Cardiac Enzymes: No results for input(s): CKTOTAL, CKMB, CKMBINDEX, TROPONINI in the last 168 hours. BNP (last 3 results) No results for input(s): PROBNP in the last 8760 hours. HbA1C: No results for input(s): HGBA1C in the last 72 hours. CBG: No results for input(s): GLUCAP in the last 168 hours. Lipid Profile: Recent Labs    02/14/20 2057  TRIG 197*   Thyroid Function Tests: No results for input(s): TSH, T4TOTAL, FREET4, T3FREE, THYROIDAB in the last 72 hours. Anemia Panel: Recent Labs    02/14/20 2057  FERRITIN 150   Sepsis Labs: Recent Labs  Lab 02/14/20 2057  PROCALCITON 0.21  LATICACIDVEN 1.1    Recent Results (from the past 240 hour(s))  SARS Coronavirus 2 by RT PCR (hospital order, performed in [facility] hospital lab) Nasopharyngeal Nasopharyngeal Swab     Status: Abnormal   Collection Time: 02/10/20  4:42 AM   Specimen:  Nasopharyngeal Swab  Result Value Ref Range Status   SARS Coronavirus 2 POSITIVE (A) NEGATIVE Final    Comment: RESULT CALLED TO, READ BACK BY AND VERIFIED WITH: Sharyon Cable AT 1478 ON 02/10/20 BY HUFFINES,S. (NOTE) SARS-CoV-2 target nucleic acids are DETECTED  SARS-CoV-2 RNA is generally detectable in upper respiratory specimens  during the acute phase of infection.  Positive results are indicative  of the presence of the identified virus, but do not rule out bacterial infection or co-infection with other pathogens not detected by the test.  Clinical correlation with patient history and  other diagnostic information is necessary to determine patient infection status.  The expected result is negative.  Fact Sheet for Patients:   StrictlyIdeas.no   Fact Sheet for Healthcare Providers:   BankingDealers.co.za    This test is not yet approved or cleared by the [place] FDA and  has been authorized for detection and/or diagnosis of SARS-CoV-2 by FDA under an Emergency Use Authorization (EUA).  This EUA will remain in effect (meanin g this test can be used) for the duration of  the COVID-19 declaration under Section 564(b)(1) of the Act, 21 U.S.C. section 360-bbb-3(b)(1), unless the authorization is terminated or revoked sooner.  Performed at [facility], [address] [place], [place] [address]   Culture, blood (Routine X 2) w Reflex to ID Panel     Status: None (Preliminary result)   Collection Time: 02/14/20  8:57 PM   Specimen: BLOOD  Result Value Ref Range Status   Specimen Description BLOOD RIGHT ANTECUBITAL  Final   Special Requests   Final    BOTTLES DRAWN AEROBIC AND ANAEROBIC Blood Culture adequate volume   Culture   Final    NO GROWTH 3 DAYS Performed at [facility], [place] [address] [place], [place] [address]    Report Status PENDING  Incomplete  Culture, blood (Routine X 2) w Reflex to ID Panel     Status: None  (Preliminary result)   Collection Time: 02/14/20  8:57 PM   Specimen: BLOOD RIGHT HAND  Result Value Ref Range Status   Specimen Description BLOOD RIGHT HAND  Final  Special Requests   Final    BOTTLES DRAWN AEROBIC AND ANAEROBIC Blood Culture adequate volume   Culture   Final    NO GROWTH 3 DAYS Performed at [facility], [place] [address], [place], [place] [address]    Report Status PENDING  Incomplete     Radiology Studies: No results found.  Scheduled Meds: . vitamin C  500 mg Oral Daily  . aspirin EC  81 mg Oral Daily  . docusate sodium  100 mg Oral Daily  . enoxaparin (LOVENOX) injection  50 mg Subcutaneous Q24H  . famotidine  20 mg Oral BID  . methylPREDNISolone (SOLU-MEDROL) injection  1 mg/kg Intravenous Q12H   Followed by  . [START ON 02/19/2020] predniSONE  50 mg Oral Daily  . ondansetron (ZOFRAN) IV  4 mg Intravenous Q6H  . potassium chloride  60 mEq Oral BID  . prenatal vitamin w/FE, FA  1 tablet Oral Q1200  . zinc sulfate  220 mg Oral Daily   Continuous Infusions: . remdesivir 100 mg in NS 100 mL 100 mg (02/17/20 0951)   Time spent:9mn   LOS: 3 days   SMarylu Lund MD [facility] Pager On Amion  If 7PM-7AM, please contact night-coverage 02/17/2020, 6:28 PM

## 2020-02-17 NOTE — Progress Notes (Signed)
Pt sitting up in chair for most of afternoon.  Required O2 - 7L via HFNC while in chair.  Assisted patient to prone position using pregnancy pillow and O2 SATS remained in upper 90s on 5L via HFNC.

## 2020-02-17 NOTE — Progress Notes (Signed)
Misty Murillo PROGRESS NOTE  Misty Murillo is a 30 y.o. G3P1011 with dichorionic, diamniotic twin gestation at [redacted]w[redacted]d who is admitted for COVID pneumonia.  Estimated Date of Delivery: 07/01/20  Length of Stay:  3 Days. Admitted 02/14/2020  Subjective: Had increased respiratory distress overnight and this morning, currently on 9 L high flow oxygen to maintain saturations.  Patient reports some fetal movement.  She reports no uterine contractions, no bleeding and no loss of fluid per vagina.  Vitals:  Blood pressure 109/61, pulse 96, temperature 97.9 F (36.6 C), temperature source Oral, resp. rate 18, last menstrual period 09/25/2019, SpO2 100 %, unknown if currently breastfeeding. Physical Examination: CONSTITUTIONAL:Appears tired SKIN: Skin is warm and dry. No rash noted. Not diaphoretic. No erythema. No pallor. [place]: Alert and oriented to person, place, and time. Normal reflexes, muscle tone coordination. No cranial nerve deficit noted. PSYCHIATRIC: Normal mood and affect. Normal behavior. Normal judgment and thought content. CARDIOVASCULAR: Elevated heart rate noted, regular rhythm RESPIRATORY: Working hard to breathe a little especially while talking MUSCULOSKELETAL: Normal range of motion. No edema and no tenderness. 2+ distal pulses. ABDOMEN: Soft, nontender, nondistended, gravid. CERVIX:  Deferred  Fetal monitoring: FHR: 156 bpm and 153 bpm Uterine activity: None reported  Results for orders placed or performed during the hospital encounter of 02/14/20 (from the past 48 hour(s))  Blood gas, arterial     Status: Abnormal   Collection Time: 02/16/20  2:41 AM  Result Value Ref Range   FIO2 100.00    Mode NASAL CANNULA    pH, Arterial 7.442 7.350 - 7.450   pCO2 arterial 32.9 32.0 - 48.0 mmHg   pO2, Arterial 79.8 (L) 83.0 - 108.0 mmHg   Bicarbonate 22.1 20.0 - 28.0 mmol/L   Acid-base deficit 1.0 0.0 - 2.0 mmol/L   O2 Saturation 96.2 %   Sample type  ARTERIAL   CBC with Differential/Platelet     Status: Abnormal   Collection Time: 02/16/20  3:50 AM  Result Value Ref Range   WBC 5.9 4.0 - 10.5 K/uL   RBC 2.94 (L) 3.87 - 5.11 MIL/uL   Hemoglobin 8.8 (L) 12.0 - 15.0 g/dL   HCT 24.5 (L) 36.0 - 46.0 %   MCV 83.3 80.0 - 100.0 fL   MCH 29.9 26.0 - 34.0 pg   MCHC 35.9 30.0 - 36.0 g/dL   RDW 13.8 11.5 - 15.5 %   Platelets 214 150 - 400 K/uL   nRBC 0.3 (H) 0.0 - 0.2 %   Neutrophils Relative % 80 %   Neutro Abs 4.7 1.7 - 7.7 K/uL   Lymphocytes Relative 9 %   Lymphs Abs 0.5 (L) 0.7 - 4.0 K/uL   Monocytes Relative 6 %   Monocytes Absolute 0.4 0.1 - 1.0 K/uL   Eosinophils Relative 0 %   Eosinophils Absolute 0.0 0.0 - 0.5 K/uL   Basophils Relative 0 %   Basophils Absolute 0.0 0.0 - 0.1 K/uL   Immature Granulocytes 5 %   Abs Immature Granulocytes 0.31 (H) 0.00 - 0.07 K/uL    Comment: Performed at [facility], [address], [place], [place] [address]  [place] metabolic panel     Status: Abnormal   Collection Time: 02/16/20  3:50 AM  Result Value Ref Range   Sodium 136 135 - 145 mmol/L   Potassium 2.9 (L) 3.5 - 5.1 mmol/L   Chloride 104 98 - 111 mmol/L   CO2 20 (L) 22 - 32 mmol/L  Glucose, Bld 121 (H) 70 - 99 mg/dL    Comment: Glucose reference range applies only to samples taken after fasting for at least 8 hours.   BUN <5 (L) 6 - 20 mg/dL   Creatinine, Ser 0.49 0.44 - 1.00 mg/dL   Calcium 8.2 (L) 8.9 - 10.3 mg/dL   Total Protein 5.4 (L) 6.5 - 8.1 g/dL   Albumin 1.9 (L) 3.5 - 5.0 g/dL   AST 48 (H) 15 - 41 U/L   ALT 28 0 - 44 U/L   Alkaline Phosphatase 90 38 - 126 U/L   Total Bilirubin 0.8 0.3 - 1.2 mg/dL   GFR, Estimated >60 >60 mL/min    Comment: (NOTE) Calculated using the CKD-EPI Creatinine Equation (2021)    Anion gap 12 5 - 15    Comment: Performed at [facility], [place] [address], [place], [place] [address]  C-reactive protein     Status: Abnormal   Collection Time: 02/16/20  3:50 AM  Result Value  Ref Range   CRP 7.8 (H) <1.0 mg/dL    Comment: Performed at [facility] [address] [place], [place] [address]  D-dimer, quantitative (not at [facility])     Status: Abnormal   Collection Time: 02/16/20  3:50 AM  Result Value Ref Range   D-Dimer, Quant 1.19 (H) 0.00 - 0.50 ug/mL-FEU    Comment: (NOTE) At the manufacturer cut-off value of 0.5 g/mL FEU, this assay has a negative predictive value of 95-100%.This assay is intended for use in conjunction with a clinical pretest probability (PTP) assessment model to exclude pulmonary embolism (PE) and deep venous thrombosis (DVT) in outpatients suspected of PE or DVT. Results should be correlated with clinical presentation. Performed at [facility], [place] [address] [place], [place] [address]   CBC with Differential/Platelet     Status: Abnormal   Collection Time: 02/17/20  7:33 AM  Result Value Ref Range   WBC 6.6 4.0 - 10.5 K/uL   RBC 3.20 (L) 3.87 - 5.11 MIL/uL   Hemoglobin 9.5 (L) 12.0 - 15.0 g/dL   HCT 26.8 (L) 36.0 - 46.0 %   MCV 83.8 80.0 - 100.0 fL   MCH 29.7 26.0 - 34.0 pg   MCHC 35.4 30.0 - 36.0 g/dL   RDW 13.9 11.5 - 15.5 %   Platelets 296 150 - 400 K/uL   nRBC 0.5 (H) 0.0 - 0.2 %   Neutrophils Relative % 74 %   Neutro Abs 4.9 1.7 - 7.7 K/uL   Lymphocytes Relative 11 %   Lymphs Abs 0.7 0.7 - 4.0 K/uL   Monocytes Relative 9 %   Monocytes Absolute 0.6 0.1 - 1.0 K/uL   Eosinophils Relative 0 %   Eosinophils Absolute 0.0 0.0 - 0.5 K/uL   Basophils Relative 0 %   Basophils Absolute 0.0 0.0 - 0.1 K/uL   Immature Granulocytes 6 %   Abs Immature Granulocytes 0.38 (H) 0.00 - 0.07 K/uL    Comment: Performed at [facility] [address] [place], [place] [address]  [place]illo metabolic panel     Status: Abnormal   Collection Time: 02/17/20  7:33 AM  Result Value Ref Range   Sodium 136 135 - 145 mmol/L   Potassium 2.9 (L) 3.5 - 5.1 mmol/L   Chloride 103 98 - 111 mmol/L   CO2 20 (L) 22 - 32 mmol/L    Glucose, Bld 125 (H) 70 - 99 mg/dL    Comment:  Glucose reference range applies only to samples taken after fasting for at least 8 hours.   BUN <5 (L) 6 - 20 mg/dL   Creatinine, Ser 0.52 0.44 - 1.00 mg/dL   Calcium 8.5 (L) 8.9 - 10.3 mg/dL   Total Protein 5.9 (L) 6.5 - 8.1 g/dL   Albumin 2.1 (L) 3.5 - 5.0 g/dL   AST 55 (H) 15 - 41 U/L   ALT 31 0 - 44 U/L   Alkaline Phosphatase 103 38 - 126 U/L   Total Bilirubin 1.7 (H) 0.3 - 1.2 mg/dL   GFR, Estimated >60 >60 mL/min    Comment: (NOTE) Calculated using the CKD-EPI Creatinine Equation (2021)    Anion gap 13 5 - 15    Comment: Performed at [facility], [place] [address], [place], [place] [address]  C-reactive protein     Status: Abnormal   Collection Time: 02/17/20  7:33 AM  Result Value Ref Range   CRP 4.3 (H) <1.0 mg/dL    Comment: Performed at [facility] [address] [place], [place] [address]  D-dimer, quantitative (not at [facility])     Status: Abnormal   Collection Time: 02/17/20  7:33 AM  Result Value Ref Range   D-Dimer, Quant 1.12 (H) 0.00 - 0.50 ug/mL-FEU    Comment: (NOTE) At the manufacturer cut-off value of 0.5 g/mL FEU, this assay has a negative predictive value of 95-100%.This assay is intended for use in conjunction with a clinical pretest probability (PTP) assessment model to exclude pulmonary embolism (PE) and deep venous thrombosis (DVT) in outpatients suspected of PE or DVT. Results should be correlated with clinical presentation. Performed at [facility], [place] [address], [place], [place] [address]     CT ANGIO CHEST PE W OR WO CONTRAST  Result Date: 02/15/2020 CLINICAL DATA:  Shortness of breath, cough, COVID positive EXAM: CT ANGIOGRAPHY CHEST WITH CONTRAST TECHNIQUE: Multidetector CT imaging of the chest was performed using the standard protocol during bolus administration of intravenous contrast. Multiplanar CT image reconstructions and MIPs were obtained to evaluate the vascular anatomy.  CONTRAST:  27mL OMNIPAQUE IOHEXOL 350 MG/ML SOLN COMPARISON:  01/28/2018 FINDINGS: Cardiovascular: Heart size normal. No pericardial effusion. Fair contrast opacification of pulmonary artery branches. No convincing filling defects to diagnose acute PE. Adequate contrast opacification of the thoracic aorta with no evidence of dissection, aneurysm, or stenosis. There is classic 3-vessel brachiocephalic arch anatomy without proximal stenosis. Mediastinum/Nodes: Right hilar and subcarinal adenopathy, new since previous, presumably reactive. Central airways unremarkable. Lungs/Pleura: No pleural effusion. No pneumothorax. New scattered somewhat nodular bilateral airspace opacities with a predominantly perihilar distribution, involving bases more than upper lobes. Upper Abdomen: No acute abnormality. Musculoskeletal: No chest wall abnormality. No acute or significant osseous findings. Review of the MIP images confirms the above findings. IMPRESSION: 1. Negative for acute PE or thoracic aortic dissection. 2. New scattered somewhat nodular bilateral airspace opacities with a predominantly perihilar distribution, involving bases more than upper lobes. Favor infectious/inflammatory etiology given rapid progression. 3. Right hilar and subcarinal adenopathy, presumably reactive. Electronically Signed   By: Lucrezia Europe M.D.   On: 02/15/2020 13:48    Current scheduled medications . vitamin C  500 mg Oral Daily  . aspirin EC  81 mg Oral Daily  . docusate sodium  100 mg Oral Daily  . enoxaparin (LOVENOX) injection  50 mg Subcutaneous Q24H  . famotidine  20 mg Oral BID  . methylPREDNISolone (SOLU-MEDROL) injection  1 mg/kg Intravenous  Q12H   Followed by  . [START ON 02/19/2020] predniSONE  50 mg Oral Daily  . ondansetron (ZOFRAN) IV  4 mg Intravenous Q6H  . potassium chloride  40 mEq Oral BID  . prenatal multivitamin  1 tablet Oral Q1200  . zinc sulfate  220 mg Oral Daily    I have reviewed the patient's current  medications.  ASSESSMENT: Principal Problem:   Pneumonia due to COVID-19 virus Active Problems:   Dichorionic diamniotic twin gestation   Sepsis ([place])   Acute hypoxemic respiratory failure (HCC)   Intractable nausea and vomiting   Diarrhea   [redacted] weeks gestation of pregnancy  PLAN: Appreciate TRH co-management, appreciate the recommendations Continue supplemental oxygen as needed to maintain SpO2 >95% Continue Remdesivir (Day 3) and Dexamethasone, may need further therapy if she continues to have respiratory distress, and may also need transfer to another unit if oxygen needs increase Continue prophylactic Lovenox for VTE prophylaxis Reassuring fetal status x 2, no acute obstetric concerns. Daily dopplers for FHR checks. Continue routine antenatal care.   Verita Schneiders, MD, [place] for [employer], [place]

## 2020-02-18 DIAGNOSIS — Z3A20 20 weeks gestation of pregnancy: Secondary | ICD-10-CM

## 2020-02-18 LAB — COMPREHENSIVE METABOLIC PANEL
ALT: 61 U/L — ABNORMAL HIGH (ref 0–44)
AST: 113 U/L — ABNORMAL HIGH (ref 15–41)
Albumin: 2.1 g/dL — ABNORMAL LOW (ref 3.5–5.0)
Alkaline Phosphatase: 100 U/L (ref 38–126)
Anion gap: 11 (ref 5–15)
BUN: <5 mg/dL — ABNORMAL LOW (ref 6–20)
CO2: 22 mmol/L (ref 22–32)
Calcium: 8 mg/dL — ABNORMAL LOW (ref 8.9–10.3)
Chloride: 105 mmol/L (ref 98–111)
Creatinine, Ser: 0.5 mg/dL (ref 0.44–1.00)
GFR, Estimated: >60 mL/min (ref >60)
Glucose, Bld: 135 mg/dL — ABNORMAL HIGH (ref 70–99)
Potassium: 3.1 mmol/L — ABNORMAL LOW (ref 3.5–5.1)
Sodium: 138 mmol/L (ref 135–145)
Total Bilirubin: 1.1 mg/dL (ref 0.3–1.2)
Total Protein: 5.5 g/dL — ABNORMAL LOW (ref 6.5–8.1)

## 2020-02-18 LAB — CBC WITH DIFFERENTIAL/PLATELET
Abs Immature Granulocytes: 0 10*3/uL (ref 0.00–0.07)
Basophils Absolute: 0 10*3/uL (ref 0.0–0.1)
Basophils Relative: 0 %
Eosinophils Absolute: 0 10*3/uL (ref 0.0–0.5)
Eosinophils Relative: 0 %
HCT: 25.3 % — ABNORMAL LOW (ref 36.0–46.0)
Hemoglobin: 8.9 g/dL — ABNORMAL LOW (ref 12.0–15.0)
Lymphocytes Relative: 5 %
Lymphs Abs: 0.4 10*3/uL — ABNORMAL LOW (ref 0.7–4.0)
MCH: 29.6 pg (ref 26.0–34.0)
MCHC: 35.2 g/dL (ref 30.0–36.0)
MCV: 84.1 fL (ref 80.0–100.0)
Monocytes Absolute: 0.5 10*3/uL (ref 0.1–1.0)
Monocytes Relative: 6 %
Neutro Abs: 7.4 10*3/uL (ref 1.7–7.7)
Neutrophils Relative %: 89 %
Platelets: 323 10*3/uL (ref 150–400)
RBC: 3.01 MIL/uL — ABNORMAL LOW (ref 3.87–5.11)
RDW: 13.8 % (ref 11.5–15.5)
WBC: 8.3 10*3/uL (ref 4.0–10.5)
nRBC: 0.5 % — ABNORMAL HIGH (ref 0.0–0.2)
nRBC: 3 /100 WBC — ABNORMAL HIGH

## 2020-02-18 LAB — MAGNESIUM: Magnesium: 2.1 mg/dL (ref 1.7–2.4)

## 2020-02-18 LAB — C-REACTIVE PROTEIN: CRP: 1.4 mg/dL — ABNORMAL HIGH (ref <1.0)

## 2020-02-18 LAB — BASIC METABOLIC PANEL
Anion gap: 13 (ref 5–15)
BUN: <5 mg/dL — ABNORMAL LOW (ref 6–20)
CO2: 21 mmol/L — ABNORMAL LOW (ref 22–32)
Calcium: 8 mg/dL — ABNORMAL LOW (ref 8.9–10.3)
Chloride: 102 mmol/L (ref 98–111)
Creatinine, Ser: 0.52 mg/dL (ref 0.44–1.00)
GFR, Estimated: >60 mL/min (ref >60)
Glucose, Bld: 165 mg/dL — ABNORMAL HIGH (ref 70–99)
Potassium: 3 mmol/L — ABNORMAL LOW (ref 3.5–5.1)
Sodium: 136 mmol/L (ref 135–145)

## 2020-02-18 LAB — D-DIMER, QUANTITATIVE: D-Dimer, Quant: 0.86 ug/mL-FEU — ABNORMAL HIGH (ref 0.00–0.50)

## 2020-02-18 MED ORDER — ALUM & MAG HYDROXIDE-SIMETH 200-200-20 MG/5ML PO SUSP
15.0000 mL | ORAL | Status: DC | PRN
  Administered 2020-02-18 – 2020-02-21 (×2): 15 mL via ORAL
  Filled 2020-02-18 (×2): qty 30

## 2020-02-18 MED ORDER — POTASSIUM CHLORIDE CRYS ER 20 MEQ PO TBCR
60.0000 meq | EXTENDED_RELEASE_TABLET | Freq: Two times a day (BID) | ORAL | Status: DC
  Administered 2020-02-18: 60 meq via ORAL
  Filled 2020-02-18: qty 3

## 2020-02-18 MED ORDER — POTASSIUM CHLORIDE CRYS ER 20 MEQ PO TBCR
60.0000 meq | EXTENDED_RELEASE_TABLET | Freq: Two times a day (BID) | ORAL | Status: AC
  Administered 2020-02-18: 60 meq via ORAL
  Filled 2020-02-18: qty 3

## 2020-02-18 MED ORDER — POTASSIUM CHLORIDE CRYS ER 20 MEQ PO TBCR
60.0000 meq | EXTENDED_RELEASE_TABLET | Freq: Once | ORAL | Status: AC
  Administered 2020-02-18: 60 meq via ORAL
  Filled 2020-02-18: qty 3

## 2020-02-18 NOTE — Progress Notes (Signed)
Patient ID: EESHA IGLESIA, female   DOB: 12-Feb-1990, 30 y.o.   MRN: [id number]   Assessment/Plan: Principal Problem:   Pneumonia due to COVID-19 virus Active Problems:   Dichorionic diamniotic twin gestation   Sepsis ([place])   Acute hypoxemic respiratory failure (HCC)   Intractable nausea and vomiting   Diarrhea   [redacted] weeks gestation of pregnancy  Inpatient monitoring HF O2 to keep sats > 95% Remdesivir day 5/5 Steroids day 5/10 Prone as much as possible. Flutter valve Inhalers Lovenox ppx No acute OB concerns Zofran for N/V  Subjective: Interval History:felt better after proning yesterday. O2 needs have increased overnight again.  Objective: Vital signs in last 24 hours: Temp:  [97.6 F (36.4 C)-98.1 F (36.7 C)] 97.9 F (36.6 C) (01/29 0442) Pulse Rate:  [96-103] 98 (01/29 0442) Resp:  [20-36] 20 (01/29 0442) BP: (105-111)/(54-69) 111/69 (01/29 0442) SpO2:  [91 %-100 %] 95 % (01/29 0712)  Intake/Output from previous day: 01/28 0701 - 01/29 0700 In: 0  Out: 950 [Urine:950] Intake/Output this shift: No intake/output data recorded.  General appearance: alert, cooperative and appears stated age Head: Normocephalic, without obvious abnormality, atraumatic Neck: supple, symmetrical, trachea midline Lungs: clear bilaterally, mild increased WOB Heart: regular rate and rhythm Abdomen: gravid, non-tender Extremities: Homans sign is negative, no sign of DVT Skin: Skin color, texture, turgor normal. No rashes or lesions  Results for orders placed or performed during the hospital encounter of 02/14/20 (from the past 24 hour(s))  Basic metabolic panel     Status: Abnormal   Collection Time: 02/17/20  3:58 PM  Result Value Ref Range   Sodium 136 135 - 145 mmol/L   Potassium 2.9 (L) 3.5 - 5.1 mmol/L   Chloride 104 98 - 111 mmol/L   CO2 20 (L) 22 - 32 mmol/L   Glucose, Bld 146 (H) 70 - 99 mg/dL   BUN <5 (L) 6 - 20 mg/dL   Creatinine, Ser 0.52 0.44 - 1.00 mg/dL   Calcium  8.3 (L) 8.9 - 10.3 mg/dL   GFR, Estimated >60 >60 mL/min   Anion gap 12 5 - 15  CBC with Differential/Platelet     Status: Abnormal (Preliminary result)   Collection Time: 02/18/20  5:59 AM  Result Value Ref Range   WBC 8.3 4.0 - 10.5 K/uL   RBC 3.01 (L) 3.87 - 5.11 MIL/uL   Hemoglobin 8.9 (L) 12.0 - 15.0 g/dL   HCT 25.3 (L) 36.0 - 46.0 %   MCV 84.1 80.0 - 100.0 fL   MCH 29.6 26.0 - 34.0 pg   MCHC 35.2 30.0 - 36.0 g/dL   RDW 13.8 11.5 - 15.5 %   Platelets 323 150 - 400 K/uL   nRBC 0.5 (H) 0.0 - 0.2 %   Neutrophils Relative % PENDING %   Neutro Abs PENDING 1.7 - 7.7 K/uL   Band Neutrophils PENDING %   Lymphocytes Relative PENDING %   Lymphs Abs PENDING 0.7 - 4.0 K/uL   Monocytes Relative PENDING %   Monocytes Absolute PENDING 0.1 - 1.0 K/uL   Eosinophils Relative PENDING %   Eosinophils Absolute PENDING 0.0 - 0.5 K/uL   Basophils Relative PENDING %   Basophils Absolute PENDING 0.0 - 0.1 K/uL   WBC Morphology PENDING    RBC Morphology PENDING    Smear Review PENDING    Other PENDING %   nRBC PENDING 0 /100 WBC   Metamyelocytes Relative PENDING %   Myelocytes PENDING %   Promyelocytes Relative  PENDING %   Blasts PENDING %   Immature Granulocytes PENDING %   Abs Immature Granulocytes PENDING 0.00 - 0.07 K/uL  Comprehensive metabolic panel     Status: Abnormal   Collection Time: 02/18/20  5:59 AM  Result Value Ref Range   Sodium 138 135 - 145 mmol/L   Potassium 3.1 (L) 3.5 - 5.1 mmol/L   Chloride 105 98 - 111 mmol/L   CO2 22 22 - 32 mmol/L   Glucose, Bld 135 (H) 70 - 99 mg/dL   BUN <5 (L) 6 - 20 mg/dL   Creatinine, Ser 0.50 0.44 - 1.00 mg/dL   Calcium 8.0 (L) 8.9 - 10.3 mg/dL   Total Protein 5.5 (L) 6.5 - 8.1 g/dL   Albumin 2.1 (L) 3.5 - 5.0 g/dL   AST 113 (H) 15 - 41 U/L   ALT 61 (H) 0 - 44 U/L   Alkaline Phosphatase 100 38 - 126 U/L   Total Bilirubin 1.1 0.3 - 1.2 mg/dL   GFR, Estimated >60 >60 mL/min   Anion gap 11 5 - 15  D-dimer, quantitative (not at [facility])      Status: Abnormal   Collection Time: 02/18/20  5:59 AM  Result Value Ref Range   D-Dimer, Quant 0.86 (H) 0.00 - 0.50 ug/mL-FEU    Studies/Results: CT ANGIO CHEST PE W OR WO CONTRAST  Result Date: 02/15/2020 CLINICAL DATA:  Shortness of breath, cough, COVID positive EXAM: CT ANGIOGRAPHY CHEST WITH CONTRAST TECHNIQUE: Multidetector CT imaging of the chest was performed using the standard protocol during bolus administration of intravenous contrast. Multiplanar CT image reconstructions and MIPs were obtained to evaluate the vascular anatomy. CONTRAST:  39mL OMNIPAQUE IOHEXOL 350 MG/ML SOLN COMPARISON:  01/28/2018 FINDINGS: Cardiovascular: Heart size normal. No pericardial effusion. Fair contrast opacification of pulmonary artery branches. No convincing filling defects to diagnose acute PE. Adequate contrast opacification of the thoracic aorta with no evidence of dissection, aneurysm, or stenosis. There is classic 3-vessel brachiocephalic arch anatomy without proximal stenosis. Mediastinum/Nodes: Right hilar and subcarinal adenopathy, new since previous, presumably reactive. Central airways unremarkable. Lungs/Pleura: No pleural effusion. No pneumothorax. New scattered somewhat nodular bilateral airspace opacities with a predominantly perihilar distribution, involving bases more than upper lobes. Upper Abdomen: No acute abnormality. Musculoskeletal: No chest wall abnormality. No acute or significant osseous findings. Review of the MIP images confirms the above findings. IMPRESSION: 1. Negative for acute PE or thoracic aortic dissection. 2. New scattered somewhat nodular bilateral airspace opacities with a predominantly perihilar distribution, involving bases more than upper lobes. Favor infectious/inflammatory etiology given rapid progression. 3. Right hilar and subcarinal adenopathy, presumably reactive. Electronically Signed   By: Lucrezia Europe M.D.   On: 02/15/2020 13:48   DG Chest Portable 1 View  Result  Date: 02/12/2020 CLINICAL DATA:  History of COVID-19.  Headache.  Chills. EXAM: PORTABLE CHEST 1 VIEW COMPARISON:  Chest radiograph 02/10/2020. FINDINGS: Stable cardiac and mediastinal contours. Interval development patchy consolidation right mid lung. No pleural effusion or pneumothorax. Thoracic spine degenerative changes. IMPRESSION: New focal consolidation right mid lung may represent atelectasis or potentially pneumonia. Recommend further evaluation with PA and lateral chest radiograph. Electronically Signed   By: Lovey Newcomer M.D.   On: 02/12/2020 10:06   DG Chest Port 1 View  Result Date: 02/10/2020 CLINICAL DATA:  Suspected COVID EXAM: PORTABLE CHEST 1 VIEW COMPARISON:  06/01/2019 FINDINGS: Normal heart size and mediastinal contours. Low volume chest. No acute infiltrate or edema. No effusion or pneumothorax. No acute osseous  findings. IMPRESSION: Negative low volume chest. Electronically Signed   By: Monte Fantasia M.D.   On: 02/10/2020 05:45   VAS [place] LOWER EXTREMITY VENOUS (DVT)  Result Date: 02/15/2020  Lower Venous DVT Study Indications: Covid,elevated ddimer.  Limitations: Pain tolerance. Comparison Study: no prior Performing Technologist: Abram Sander RVS  Examination Guidelines: A complete evaluation includes B-mode imaging, spectral Doppler, color Doppler, and power Doppler as needed of all accessible portions of each vessel. Bilateral testing is considered an integral part of a complete examination. Limited examinations for reoccurring indications may be performed as noted. The reflux portion of the exam is performed with the patient in reverse Trendelenburg.  +---------+---------------+---------+-----------+----------+-------------------+ RIGHT    CompressibilityPhasicitySpontaneityPropertiesThrombus Aging      +---------+---------------+---------+-----------+----------+-------------------+ CFV      Full           Yes      Yes                                       +---------+---------------+---------+-----------+----------+-------------------+ SFJ      Full                                                             +---------+---------------+---------+-----------+----------+-------------------+ FV Prox  Full                                                             +---------+---------------+---------+-----------+----------+-------------------+ FV Mid                  Yes      Yes                                      +---------+---------------+---------+-----------+----------+-------------------+ FV Distal               Yes      Yes                                      +---------+---------------+---------+-----------+----------+-------------------+ PFV      Full                                                             +---------+---------------+---------+-----------+----------+-------------------+ POP      Partial        Yes      Yes                                      +---------+---------------+---------+-----------+----------+-------------------+ PTV      Full                                                             +---------+---------------+---------+-----------+----------+-------------------+  PERO                                                  Not well visualized +---------+---------------+---------+-----------+----------+-------------------+   +---------+---------------+---------+-----------+----------+-------------------+ LEFT     CompressibilityPhasicitySpontaneityPropertiesThrombus Aging      +---------+---------------+---------+-----------+----------+-------------------+ CFV      Full           Yes      Yes                                      +---------+---------------+---------+-----------+----------+-------------------+ SFJ      Full                                                             +---------+---------------+---------+-----------+----------+-------------------+ FV  Prox  Full                                                             +---------+---------------+---------+-----------+----------+-------------------+ FV Mid                  Yes      Yes                                      +---------+---------------+---------+-----------+----------+-------------------+ FV Distal               Yes      Yes                                      +---------+---------------+---------+-----------+----------+-------------------+ PFV      Full                                                             +---------+---------------+---------+-----------+----------+-------------------+ POP      Full           Yes      Yes                                      +---------+---------------+---------+-----------+----------+-------------------+ PTV      Full                                                             +---------+---------------+---------+-----------+----------+-------------------+ PERO  Not well visualized +---------+---------------+---------+-----------+----------+-------------------+     Summary: BILATERAL: - No evidence of deep vein thrombosis seen in the lower extremities, bilaterally. - No evidence of superficial venous thrombosis in the lower extremities, bilaterally. -No evidence of popliteal cyst, bilaterally.   *See table(s) above for measurements and observations. Electronically signed by Harold Barban MD on 02/15/2020 at 9:40:50 PM.    Final     Scheduled Meds: . vitamin C  500 mg Oral Daily  . aspirin EC  81 mg Oral Daily  . docusate sodium  100 mg Oral Daily  . enoxaparin (LOVENOX) injection  50 mg Subcutaneous Q24H  . famotidine  20 mg Oral BID  . methylPREDNISolone (SOLU-MEDROL) injection  1 mg/kg Intravenous Q12H   Followed by  . [START ON 02/19/2020] predniSONE  50 mg Oral Daily  . ondansetron (ZOFRAN) IV  4 mg Intravenous Q6H  . potassium chloride  60 mEq Oral  BID  . prenatal vitamin w/FE, FA  1 tablet Oral Q1200  . zinc sulfate  220 mg Oral Daily   Continuous Infusions: . remdesivir 100 mg in NS 100 mL 100 mg (02/17/20 0951)   PRN Meds:acetaminophen, albuterol, calcium carbonate, guaiFENesin-dextromethorphan, loperamide, menthol-cetylpyridinium    LOS: 4 days   Donnamae Jude, MD 02/18/2020 7:38 AM

## 2020-02-18 NOTE — Progress Notes (Signed)
PROGRESS NOTE    Misty Murillo  MGQ:[id number] DOB: 1990/04/19 DOA: 02/14/2020 PCP: Orma Flaming, MD    Brief Narrative:  30 y.o. female [phone] '@[redacted]w[redacted]d'  gestation with twins, Graves' disease, depression, anxiety, history of PID presented to [facility] complaining of symptoms of an acute viral illness.  She tested positive for COVID-19 on 1/21 during ED visit and then returned on 1/23 and was diagnosed with Covid pneumonia.  On arrival to [facility] today, patient was febrile, tachycardic, tachypneic, and SPO2 80% on room air when supine.  Not hypotensive.  WBC 6.6, hemoglobin 10.2 (stable compared to recent labs), platelet count 220K.  Sodium 131, potassium 4.3, chloride 99, bicarb 20, anion gap 12, BUN <5, creatinine 0.6, glucose 112.  AST 82, ALT 36, alk phos 93, T bili 2.2.  Lipase normal. She was given Tylenol, Zofran, and 1 L LR bolus.  Patient is not vaccinated against Covid. She reports 1 week history of body aches, fatigue, cough, shortness of breath, nausea, vomiting, and diarrhea. States she is not able to tolerate any p.o. intake. She is also having discomfort in her lower abdomen. States she has Graves' disease for which she is seen by endocrinologist Dr. Dwyane Dee and currently not on any treatments as her thyroid function labs were normal during recent office visit and her endocrinologist told her he will continue to monitor her labs periodically. No other complaints.  Assessment & Plan:   Principal Problem:   Pneumonia due to COVID-19 virus Active Problems:   Dichorionic diamniotic twin gestation   Sepsis ([place])   Acute hypoxemic respiratory failure (HCC)   Intractable nausea and vomiting   Diarrhea   [redacted] weeks gestation of pregnancy  Sepsis and acute hypoxemic respiratory failure secondary to COVID-19 viral pneumonia: Meets criteria for sepsis -3 SIRS (fever, tachycardia, tachypnea) and SARS-CoV-2 PCR test positive on 02/10/2020.  No hypotension to suggest severe sepsis.   -Chest x-ray done  02/12/2020 showing new focal consolidation in the right midlung.  . -Pt completed course of remdesivir. Remains on steroids, anticipate total 10 days of steroids -Chest CT without PE -CRP is down peaked to 8.0, now down to 1.4 today -Recently needing increased O2 requirements up to 11 L at night -Pt now s/p proning and instructed on frequent use of IS -O2 requirements improved this afternoon to [facility]  Intractable nausea and vomiting Diarrhea Suspect symptoms are due to COVID-19 viral gastroenteritis.  AST 82, ALT 36, alk phos 93, T bili 2.2.  Lipase normal.  Mild elevation of LFTs likely due to COVID-19 viral infection.   -remains stable  Mild hyponatremia: Likely due to poor oral intake/vomiting/diarrhea.  Sodium normalized -continued to encourage hydration -resolved  Mild normal anion gap metabolic acidosis: Likely due to diarrhea.  Bicarb 20, anion gap 12. -Continue to encourage hydration as tolerated  Tachycardia: TSH and free T4 checked on 01/31/2020 were normal. -Pt is continued on heart monitor  History of Graves' disease: Patient is followed by endocrinology and recent thyroid function tests were normal. -Recommend f/u with Endocrine as outpatient   Pregnancy (77w2dgestation with twins) -Management per primary team.  Hypokalemia -K remains low at 3.1 -cont to replace -Will repeat lytes this afternoon and cont to replace as needed  DVT prophylaxis: Lovenox subq Code Status: Full Family Communication: Pt in room, family at bedside  Status is: Inpatient  Remains inpatient appropriate because:Unsafe d/c plan, IV treatments appropriate due to intensity of illness or inability to take PO and Inpatient level of care appropriate  due to severity of illness   Dispo: The patient is from: Home              Anticipated d/c is to: Home              Anticipated d/c date is: 3 days              Patient currently is not medically stable to d/c.   Difficult to place patient  No   Consultants:     Procedures:     Antimicrobials: Anti-infectives (From admission, onward)   Start     Dose/Rate Route Frequency Ordered Stop   02/15/20 1000  remdesivir 100 mg in sodium chloride 0.9 % 100 mL IVPB       "Followed by" Linked Group Details   100 mg 200 mL/hr over 30 Minutes Intravenous Daily 02/14/20 2034 02/18/20 0906   02/14/20 2130  remdesivir 200 mg in sodium chloride 0.9% 250 mL IVPB       "Followed by" Linked Group Details   200 mg 580 mL/hr over 30 Minutes Intravenous Once 02/14/20 2034 02/14/20 2202      Subjective: Reports feeling better today  Objective: Vitals:   02/18/20 1245 02/18/20 1250 02/18/20 1255 02/18/20 1300  BP:      Pulse:      Resp:      Temp:      TempSrc:      SpO2: 95% 95% 95% 95%    Intake/Output Summary (Last 24 hours) at 02/18/2020 1447 Last data filed at 02/18/2020 1000 Gross per 24 hour  Intake 100 ml  Output 1400 ml  Net -1300 ml   There were no vitals filed for this visit.  Examination: General exam: Awake, laying in bed, in nad Respiratory system: Normal respiratory effort, no wheezing Cardiovascular system: regular rate, s1, s2 Gastrointestinal system: Soft, nondistended, positive BS Central nervous system: CN2-12 grossly intact, strength intact Extremities: Perfused, no clubbing Skin: Normal skin turgor, no notable skin lesions seen Psychiatry: Mood normal // no visual hallucinations   Data Reviewed: I have personally reviewed following labs and imaging studies  CBC: Recent Labs  Lab 02/12/20 0937 02/14/20 1655 02/15/20 0727 02/16/20 0350 02/17/20 0733 02/18/20 0559  WBC 6.1 6.6 5.6 5.9 6.6 8.3  NEUTROABS 5.1  --  4.5 4.7 4.9 7.4  HGB 10.7* 10.2* 8.7* 8.8* 9.5* 8.9*  HCT 30.2* 29.6* 25.5* 24.5* 26.8* 25.3*  MCV 84.6 83.9 83.6 83.3 83.8 84.1  PLT 245 220 197 214 296 443   Basic Metabolic Panel: Recent Labs  Lab 02/12/20 0937 02/14/20 1655 02/15/20 0727 02/16/20 0350 02/17/20 0733  02/17/20 1558 02/18/20 0559  NA 131*   < > 135 136 136 136 138  K 2.8*   < > 2.7* 2.9* 2.9* 2.9* 3.1*  CL 102   < > 102 104 103 104 105  CO2 20*   < > 21* 20* 20* 20* 22  GLUCOSE 114*   < > 109* 121* 125* 146* 135*  BUN <5*   < > <5* <5* <5* <5* <5*  CREATININE 0.52   < > 0.53 0.49 0.52 0.52 0.50  CALCIUM 8.3*   < > 8.3* 8.2* 8.5* 8.3* 8.0*  MG 2.1  --  1.8  --   --   --   --    < > = values in this interval not displayed.   GFR: Estimated Creatinine Clearance: 122 mL/min (by C-G formula based on SCr of 0.5 mg/dL). Liver Function  Tests: Recent Labs  Lab 02/14/20 1655 02/15/20 0727 02/16/20 0350 02/17/20 0733 02/18/20 0559  AST 82* 43* 48* 55* 113*  ALT 36 '28 28 31 ' 61*  ALKPHOS 93 81 90 103 100  BILITOT 2.2* 1.3* 0.8 1.7* 1.1  PROT 6.3* 5.7* 5.4* 5.9* 5.5*  ALBUMIN 2.4* 2.1* 1.9* 2.1* 2.1*   Recent Labs  Lab 02/14/20 1655  LIPASE 21   No results for input(s): AMMONIA in the last 168 hours. Coagulation Profile: Recent Labs  Lab 02/15/20 0022  INR 1.1   Cardiac Enzymes: No results for input(s): CKTOTAL, CKMB, CKMBINDEX, TROPONINI in the last 168 hours. BNP (last 3 results) No results for input(s): PROBNP in the last 8760 hours. HbA1C: No results for input(s): HGBA1C in the last 72 hours. CBG: No results for input(s): GLUCAP in the last 168 hours. Lipid Profile: No results for input(s): CHOL, HDL, LDLCALC, TRIG, CHOLHDL, LDLDIRECT in the last 72 hours. Thyroid Function Tests: No results for input(s): TSH, T4TOTAL, FREET4, T3FREE, THYROIDAB in the last 72 hours. Anemia Panel: No results for input(s): VITAMINB12, FOLATE, FERRITIN, TIBC, IRON, RETICCTPCT in the last 72 hours. Sepsis Labs: Recent Labs  Lab 02/14/20 2057  PROCALCITON 0.21  LATICACIDVEN 1.1    Recent Results (from the past 240 hour(s))  SARS Coronavirus 2 by RT PCR (hospital order, performed in [facility] hospital lab) Nasopharyngeal Nasopharyngeal Swab     Status: Abnormal   Collection Time:  02/10/20  4:42 AM   Specimen: Nasopharyngeal Swab  Result Value Ref Range Status   SARS Coronavirus 2 POSITIVE (A) NEGATIVE Final    Comment: RESULT CALLED TO, READ BACK BY AND VERIFIED WITH: Sharyon Cable AT 6712 ON 02/10/20 BY HUFFINES,S. (NOTE) SARS-CoV-2 target nucleic acids are DETECTED  SARS-CoV-2 RNA is generally detectable in upper respiratory specimens  during the acute phase of infection.  Positive results are indicative  of the presence of the identified virus, but do not rule out bacterial infection or co-infection with other pathogens not detected by the test.  Clinical correlation with patient history and  other diagnostic information is necessary to determine patient infection status.  The expected result is negative.  Fact Sheet for Patients:   StrictlyIdeas.no   Fact Sheet for Healthcare Providers:   BankingDealers.co.za    This test is not yet approved or cleared by the [place] FDA and  has been authorized for detection and/or diagnosis of SARS-CoV-2 by FDA under an Emergency Use Authorization (EUA).  This EUA will remain in effect (meanin g this test can be used) for the duration of  the COVID-19 declaration under Section 564(b)(1) of the Act, 21 U.S.C. section 360-bbb-3(b)(1), unless the authorization is terminated or revoked sooner.  Performed at [facility], [address], [place], [place] [address]   Culture, blood (Routine X 2) w Reflex to ID Panel     Status: None (Preliminary result)   Collection Time: 02/14/20  8:57 PM   Specimen: BLOOD  Result Value Ref Range Status   Specimen Description BLOOD RIGHT ANTECUBITAL  Final   Special Requests   Final    BOTTLES DRAWN AEROBIC AND ANAEROBIC Blood Culture adequate volume   Culture   Final    NO GROWTH 3 DAYS Performed at [facility], [place] [address], [place], [place] [address]    Report Status PENDING  Incomplete  Culture, blood (Routine X 2) w  Reflex to ID Panel     Status: None (Preliminary result)   Collection Time: 02/14/20  8:57 PM   Specimen: BLOOD RIGHT HAND  Result Value Ref Range Status   Specimen Description BLOOD RIGHT HAND  Final   Special Requests   Final    BOTTLES DRAWN AEROBIC AND ANAEROBIC Blood Culture adequate volume   Culture   Final    NO GROWTH 3 DAYS Performed at [facility], [address] [place], [place] [address]    Report Status PENDING  Incomplete     Radiology Studies: No results found.  Scheduled Meds: . vitamin C  500 mg Oral Daily  . aspirin EC  81 mg Oral Daily  . docusate sodium  100 mg Oral Daily  . enoxaparin (LOVENOX) injection  50 mg Subcutaneous Q24H  . famotidine  20 mg Oral BID  . methylPREDNISolone (SOLU-MEDROL) injection  1 mg/kg Intravenous Q12H   Followed by  . [START ON 02/19/2020] predniSONE  50 mg Oral Daily  . ondansetron (ZOFRAN) IV  4 mg Intravenous Q6H  . potassium chloride  60 mEq Oral BID  . prenatal vitamin w/FE, FA  1 tablet Oral Q1200  . zinc sulfate  220 mg Oral Daily   Continuous Infusions:  Time spent:52mn   LOS: 4 days   SMarylu Lund MD [facility] Pager On Amion  If 7PM-7AM, please contact night-coverage 02/18/2020, 2:47 PM

## 2020-02-19 LAB — CBC WITH DIFFERENTIAL/PLATELET
Abs Immature Granulocytes: 0 10*3/uL (ref 0.00–0.07)
Basophils Absolute: 0 10*3/uL (ref 0.0–0.1)
Basophils Relative: 0 %
Eosinophils Absolute: 0 10*3/uL (ref 0.0–0.5)
Eosinophils Relative: 0 %
HCT: 26.4 % — ABNORMAL LOW (ref 36.0–46.0)
Hemoglobin: 8.8 g/dL — ABNORMAL LOW (ref 12.0–15.0)
Lymphocytes Relative: 2 %
Lymphs Abs: 0.2 10*3/uL — ABNORMAL LOW (ref 0.7–4.0)
MCH: 28.8 pg (ref 26.0–34.0)
MCHC: 33.3 g/dL (ref 30.0–36.0)
MCV: 86.3 fL (ref 80.0–100.0)
Monocytes Absolute: 0.5 10*3/uL (ref 0.1–1.0)
Monocytes Relative: 4 %
Neutro Abs: 11 10*3/uL — ABNORMAL HIGH (ref 1.7–7.7)
Neutrophils Relative %: 94 %
Platelets: 364 10*3/uL (ref 150–400)
RBC: 3.06 MIL/uL — ABNORMAL LOW (ref 3.87–5.11)
RDW: 14 % (ref 11.5–15.5)
WBC: 11.7 10*3/uL — ABNORMAL HIGH (ref 4.0–10.5)
nRBC: 0 /100 WBC
nRBC: 0.3 % — ABNORMAL HIGH (ref 0.0–0.2)

## 2020-02-19 LAB — D-DIMER, QUANTITATIVE: D-Dimer, Quant: 0.86 ug/mL-FEU — ABNORMAL HIGH (ref 0.00–0.50)

## 2020-02-19 LAB — COMPREHENSIVE METABOLIC PANEL
ALT: 130 U/L — ABNORMAL HIGH (ref 0–44)
AST: 153 U/L — ABNORMAL HIGH (ref 15–41)
Albumin: 2.2 g/dL — ABNORMAL LOW (ref 3.5–5.0)
Alkaline Phosphatase: 89 U/L (ref 38–126)
Anion gap: 11 (ref 5–15)
BUN: <5 mg/dL — ABNORMAL LOW (ref 6–20)
CO2: 21 mmol/L — ABNORMAL LOW (ref 22–32)
Calcium: 8.1 mg/dL — ABNORMAL LOW (ref 8.9–10.3)
Chloride: 105 mmol/L (ref 98–111)
Creatinine, Ser: 0.49 mg/dL (ref 0.44–1.00)
GFR, Estimated: >60 mL/min (ref >60)
Glucose, Bld: 159 mg/dL — ABNORMAL HIGH (ref 70–99)
Potassium: 3.5 mmol/L (ref 3.5–5.1)
Sodium: 137 mmol/L (ref 135–145)
Total Bilirubin: 0.8 mg/dL (ref 0.3–1.2)
Total Protein: 5.7 g/dL — ABNORMAL LOW (ref 6.5–8.1)

## 2020-02-19 LAB — TYPE AND SCREEN
ABO/RH(D): O POS
Antibody Screen: NEGATIVE

## 2020-02-19 LAB — CULTURE, BLOOD (ROUTINE X 2)
Culture: NO GROWTH
Culture: NO GROWTH
Special Requests: ADEQUATE
Special Requests: ADEQUATE

## 2020-02-19 LAB — C-REACTIVE PROTEIN: CRP: 0.7 mg/dL (ref <1.0)

## 2020-02-19 MED ORDER — PROMETHAZINE HCL 25 MG/ML IJ SOLN
25.0000 mg | Freq: Four times a day (QID) | INTRAMUSCULAR | Status: DC | PRN
  Administered 2020-02-19: 25 mg via INTRAVENOUS
  Filled 2020-02-19 (×2): qty 1

## 2020-02-19 MED ORDER — FAMOTIDINE 40 MG/5ML PO SUSR
20.0000 mg | Freq: Two times a day (BID) | ORAL | Status: DC
  Administered 2020-02-19 – 2020-02-22 (×6): 20 mg via ORAL
  Filled 2020-02-19 (×8): qty 2.5

## 2020-02-19 MED ORDER — PREDNISONE 5 MG/5ML PO SOLN
50.0000 mg | Freq: Every day | ORAL | Status: DC
  Filled 2020-02-19 (×2): qty 50

## 2020-02-19 MED ORDER — POLYETHYLENE GLYCOL 3350 17 G PO PACK
17.0000 g | PACK | Freq: Every day | ORAL | Status: DC
  Administered 2020-02-19 – 2020-02-20 (×2): 17 g via ORAL
  Filled 2020-02-19 (×4): qty 1

## 2020-02-19 MED ORDER — POTASSIUM CHLORIDE 20 MEQ PO PACK
40.0000 meq | PACK | Freq: Once | ORAL | Status: AC
  Administered 2020-02-19: 40 meq via ORAL
  Filled 2020-02-19: qty 2

## 2020-02-19 MED ORDER — ASPIRIN 81 MG PO CHEW
81.0000 mg | CHEWABLE_TABLET | Freq: Every day | ORAL | Status: DC
  Administered 2020-02-20 – 2020-02-22 (×3): 81 mg via ORAL
  Filled 2020-02-19 (×3): qty 1

## 2020-02-19 MED ORDER — ACETAMINOPHEN 160 MG/5ML PO SOLN: 650.0000 mg | ORAL | Status: DC | PRN

## 2020-02-19 NOTE — Progress Notes (Addendum)
Patient O2 saturation at 98% on 3L HFNC. RN observed patient sitting up in bed eating, drinking, talking, and breathing without duress. O2 was decreased to 2.5L HFNC with continuous pulse oximetry. Patient using flutter valve and incentive spirometer as instructed by RN with satisfactory results.

## 2020-02-19 NOTE — Progress Notes (Signed)
Patient O2 saturation 98% on 1.5 L HFNC. Patient resting with no observed signs of respiratory distress.

## 2020-02-19 NOTE — Progress Notes (Signed)
PROGRESS NOTE    Misty Murillo  XBD:[id number] DOB: [birth year] DOA: 02/14/2020 PCP: Orma Flaming, MD    Brief Narrative:  30 y.o. female [phone] '@[redacted]w[redacted]d'  gestation with twins, Graves' disease, depression, anxiety, history of PID presented to [facility] complaining of symptoms of an acute viral illness.  She tested positive for COVID-19 on 1/21 during ED visit and then returned on 1/23 and was diagnosed with Covid pneumonia.  On arrival to [facility] today, patient was febrile, tachycardic, tachypneic, and SPO2 80% on room air when supine.  Not hypotensive.  WBC 6.6, hemoglobin 10.2 (stable compared to recent labs), platelet count 220K.  Sodium 131, potassium 4.3, chloride 99, bicarb 20, anion gap 12, BUN <5, creatinine 0.6, glucose 112.  AST 82, ALT 36, alk phos 93, T bili 2.2.  Lipase normal. She was given Tylenol, Zofran, and 1 L LR bolus.  Patient is not vaccinated against Covid. She reports 1 week history of body aches, fatigue, cough, shortness of breath, nausea, vomiting, and diarrhea. States she is not able to tolerate any p.o. intake. She is also having discomfort in her lower abdomen. States she has Graves' disease for which she is seen by endocrinologist Dr. Dwyane Dee and currently not on any treatments as her thyroid function labs were normal during recent office visit and her endocrinologist told her he will continue to monitor her labs periodically. No other complaints.  Assessment & Plan:   Principal Problem:   Pneumonia due to COVID-19 virus Active Problems:   Dichorionic diamniotic twin gestation   Sepsis ([place])   Acute hypoxemic respiratory failure (HCC)   Intractable nausea and vomiting   Diarrhea   [redacted] weeks gestation of pregnancy  Sepsis and acute hypoxemic respiratory failure secondary to COVID-19 viral pneumonia: Meets criteria for sepsis -3 SIRS (fever, tachycardia, tachypnea) and SARS-CoV-2 PCR test positive on 02/10/2020.  No hypotension to suggest severe sepsis.   -Chest x-ray done  02/12/2020 showing new focal consolidation in the right midlung.  . -Pt completed course of remdesivir. Remains on steroids, anticipate total 10 days of steroids -Chest CT without PE -CRP is down peaked to 8.0, now down to 1.4 today -Recently needing increased O2 requirements peaking to 11L -Pt now s/p proning and IS use with O2 requirements down to 2.5L  Intractable nausea and vomiting Diarrhea Suspect symptoms are due to COVID-19 viral gastroenteritis.  AST 82, ALT 36, alk phos 93, T bili 2.2.  Lipase normal.  Mild elevation of LFTs likely due to COVID-19 viral infection.   -remains stable  Mild hyponatremia: Likely due to poor oral intake/vomiting/diarrhea.  Sodium normalized -continued to encourage hydration -resolved  Mild normal anion gap metabolic acidosis: Likely due to diarrhea.  Bicarb 20, anion gap 12. -Encourage hydration as tolerated  Tachycardia: TSH and free T4 checked on 01/31/2020 were normal. -Pt is continued on heart monitor  History of Graves' disease: Patient is followed by endocrinology and recent thyroid function tests were normal. -Recommend f/u with Endocrine as outpatient   Pregnancy (19w2dgestation with twins) -Management per primary team.  Hypokalemia -K today 3.5 -cont to replace -Will repeat lytes this afternoon and cont to replace as needed  DVT prophylaxis: Lovenox subq Code Status: Full Family Communication: Pt in room, family at bedside  Status is: Inpatient  Remains inpatient appropriate because:Unsafe d/c plan, IV treatments appropriate due to intensity of illness or inability to take PO and Inpatient level of care appropriate due to severity of illness   Dispo: The patient is from:  Home              Anticipated d/c is to: Home              Anticipated d/c date is: 3 days              Patient currently is not medically stable to d/c.   Difficult to place patient No   Consultants:     Procedures:      Antimicrobials: Anti-infectives (From admission, onward)   Start     Dose/Rate Route Frequency Ordered Stop   02/15/20 1000  remdesivir 100 mg in sodium chloride 0.9 % 100 mL IVPB       "Followed by" Linked Group Details   100 mg 200 mL/hr over 30 Minutes Intravenous Daily 02/14/20 2034 02/18/20 0906   02/14/20 2130  remdesivir 200 mg in sodium chloride 0.9% 250 mL IVPB       "Followed by" Linked Group Details   200 mg 580 mL/hr over 30 Minutes Intravenous Once 02/14/20 2034 02/14/20 2202      Subjective: States breathing better. In good spirits today  Objective: Vitals:   02/19/20 1029 02/19/20 1034 02/19/20 1039 02/19/20 1112  BP:    109/79  Pulse:    99  Resp:    18  Temp:    (!) 97.5 F (36.4 C)  TempSrc:    Oral  SpO2: 99% 99% 99% 98%    Intake/Output Summary (Last 24 hours) at 02/19/2020 1457 Last data filed at 02/19/2020 1340 Gross per 24 hour  Intake 1660 ml  Output 2550 ml  Net -890 ml   There were no vitals filed for this visit.  Examination: General exam: Conversant, in no acute distress Respiratory system: normal chest rise, clear, no audible wheezing Cardiovascular system: regular rhythm, s1-s2 Gastrointestinal system: Nondistended, nontender, pos BS Central nervous system: No seizures, no tremors Extremities: No cyanosis, no joint deformities Skin: No rashes, no pallor Psychiatry: Affect normal // no auditory hallucinations   Data Reviewed: I have personally reviewed following labs and imaging studies  CBC: Recent Labs  Lab 02/15/20 0727 02/16/20 0350 02/17/20 0733 02/18/20 0559 02/19/20 0528  WBC 5.6 5.9 6.6 8.3 11.7*  NEUTROABS 4.5 4.7 4.9 7.4 11.0*  HGB 8.7* 8.8* 9.5* 8.9* 8.8*  HCT 25.5* 24.5* 26.8* 25.3* 26.4*  MCV 83.6 83.3 83.8 84.1 86.3  PLT 197 214 296 323 371   Basic Metabolic Panel: Recent Labs  Lab 02/15/20 0727 02/16/20 0350 02/17/20 0733 02/17/20 1558 02/18/20 0559 02/18/20 1549 02/19/20 0528  NA 135   < >  136 136 138 136 137  K 2.7*   < > 2.9* 2.9* 3.1* 3.0* 3.5  CL 102   < > 103 104 105 102 105  CO2 21*   < > 20* 20* 22 21* 21*  GLUCOSE 109*   < > 125* 146* 135* 165* 159*  BUN <5*   < > <5* <5* <5* <5* <5*  CREATININE 0.53   < > 0.52 0.52 0.50 0.52 0.49  CALCIUM 8.3*   < > 8.5* 8.3* 8.0* 8.0* 8.1*  MG 1.8  --   --   --   --  2.1  --    < > = values in this interval not displayed.   GFR: Estimated Creatinine Clearance: 122 mL/min (by C-G formula based on SCr of 0.49 mg/dL). Liver Function Tests: Recent Labs  Lab 02/15/20 0727 02/16/20 0350 02/17/20 0733 02/18/20 0559 02/19/20 0528  AST 43*  48* 55* 113* 153*  ALT '28 28 31 ' 61* 130*  ALKPHOS 81 90 103 100 89  BILITOT 1.3* 0.8 1.7* 1.1 0.8  PROT 5.7* 5.4* 5.9* 5.5* 5.7*  ALBUMIN 2.1* 1.9* 2.1* 2.1* 2.2*   Recent Labs  Lab 02/14/20 1655  LIPASE 21   No results for input(s): AMMONIA in the last 168 hours. Coagulation Profile: Recent Labs  Lab 02/15/20 0022  INR 1.1   Cardiac Enzymes: No results for input(s): CKTOTAL, CKMB, CKMBINDEX, TROPONINI in the last 168 hours. BNP (last 3 results) No results for input(s): PROBNP in the last 8760 hours. HbA1C: No results for input(s): HGBA1C in the last 72 hours. CBG: No results for input(s): GLUCAP in the last 168 hours. Lipid Profile: No results for input(s): CHOL, HDL, LDLCALC, TRIG, CHOLHDL, LDLDIRECT in the last 72 hours. Thyroid Function Tests: No results for input(s): TSH, T4TOTAL, FREET4, T3FREE, THYROIDAB in the last 72 hours. Anemia Panel: No results for input(s): VITAMINB12, FOLATE, FERRITIN, TIBC, IRON, RETICCTPCT in the last 72 hours. Sepsis Labs: Recent Labs  Lab 02/14/20 2057  PROCALCITON 0.21  LATICACIDVEN 1.1    Recent Results (from the past 240 hour(s))  SARS Coronavirus 2 by RT PCR (hospital order, performed in [facility] hospital lab) Nasopharyngeal Nasopharyngeal Swab     Status: Abnormal   Collection Time: 02/10/20  4:42 AM   Specimen:  Nasopharyngeal Swab  Result Value Ref Range Status   SARS Coronavirus 2 POSITIVE (A) NEGATIVE Final    Comment: RESULT CALLED TO, READ BACK BY AND VERIFIED WITH: Sharyon Cable AT 6286 ON 02/10/20 BY HUFFINES,S. (NOTE) SARS-CoV-2 target nucleic acids are DETECTED  SARS-CoV-2 RNA is generally detectable in upper respiratory specimens  during the acute phase of infection.  Positive results are indicative  of the presence of the identified virus, but do not rule out bacterial infection or co-infection with other pathogens not detected by the test.  Clinical correlation with patient history and  other diagnostic information is necessary to determine patient infection status.  The expected result is negative.  Fact Sheet for Patients:   StrictlyIdeas.no   Fact Sheet for Healthcare Providers:   BankingDealers.co.za    This test is not yet approved or cleared by the [place] FDA and  has been authorized for detection and/or diagnosis of SARS-CoV-2 by FDA under an Emergency Use Authorization (EUA).  This EUA will remain in effect (meanin g this test can be used) for the duration of  the COVID-19 declaration under Section 564(b)(1) of the Act, 21 U.S.C. section 360-bbb-3(b)(1), unless the authorization is terminated or revoked sooner.  Performed at [facility], [address], [place], [place] [address]   Culture, blood (Routine X 2) w Reflex to ID Panel     Status: None   Collection Time: 02/14/20  8:57 PM   Specimen: BLOOD  Result Value Ref Range Status   Specimen Description BLOOD RIGHT ANTECUBITAL  Final   Special Requests   Final    BOTTLES DRAWN AEROBIC AND ANAEROBIC Blood Culture adequate volume   Culture   Final    NO GROWTH 5 DAYS Performed at [facility], [address], [place], [place] [address]    Report Status 02/19/2020 FINAL  Final  Culture, blood (Routine X 2) w Reflex to ID Panel     Status: None   Collection  Time: 02/14/20  8:57 PM   Specimen: BLOOD RIGHT HAND  Result Value Ref Range Status   Specimen Description BLOOD RIGHT  HAND  Final   Special Requests   Final    BOTTLES DRAWN AEROBIC AND ANAEROBIC Blood Culture adequate volume   Culture   Final    NO GROWTH 5 DAYS Performed at [facility], [address] [place], [place] [address]    Report Status 02/19/2020 FINAL  Final     Radiology Studies: No results found.  Scheduled Meds: . vitamin C  500 mg Oral Daily  . aspirin  81 mg Oral Daily  . docusate sodium  100 mg Oral Daily  . enoxaparin (LOVENOX) injection  50 mg Subcutaneous Q24H  . famotidine  20 mg Oral BID  . ondansetron (ZOFRAN) IV  4 mg Intravenous Q6H  . polyethylene glycol  17 g Oral Daily  . [START ON 02/20/2020] predniSONE  50 mg Oral Q breakfast  . prenatal vitamin w/FE, FA  1 tablet Oral Q1200  . zinc sulfate  220 mg Oral Daily   Continuous Infusions:  Time spent:51mn   LOS: 5 days   SMarylu Lund MD [facility] Pager On Amion  If 7PM-7AM, please contact night-coverage 02/19/2020, 2:57 PM

## 2020-02-19 NOTE — Plan of Care (Signed)
  Problem: Education: Goal: Knowledge of disease or condition will improve Outcome: Progressing Goal: Knowledge of the prescribed therapeutic regimen will improve Outcome: Progressing Goal: Individualized Educational Video(s) Outcome: Progressing   Problem: Clinical Measurements: Goal: Complications related to the disease process, condition or treatment will be avoided or minimized Outcome: Progressing   Problem: Education: Goal: Knowledge of risk factors and measures for prevention of condition will improve Outcome: Progressing   Problem: Coping: Goal: Psychosocial and spiritual needs will be supported Outcome: Progressing   Problem: Respiratory: Goal: Will maintain a patent airway Outcome: Progressing Goal: Complications related to the disease process, condition or treatment will be avoided or minimized Outcome: Progressing   Problem: Education: Goal: Knowledge of disease or condition will improve Outcome: Progressing Goal: Individualized Educational Video(s) Outcome: Progressing   Problem: Clinical Measurements: Goal: Complications related to the disease process, condition or treatment will be avoided or minimized Outcome: Progressing   Problem: Education: Goal: Knowledge of General Education information will improve Description: Including pain rating scale, medication(s)/side effects and non-pharmacologic comfort measures 02/19/2020 0957 by Sylvan Cheese, RN Outcome: Progressing 02/19/2020 0952 by Sylvan Cheese, RN Outcome: Progressing   Problem: Health Behavior/Discharge Planning: Goal: Ability to manage health-related needs will improve 02/19/2020 0957 by Sylvan Cheese, RN Outcome: Progressing 02/19/2020 0952 by Sylvan Cheese, RN Outcome: Progressing   Problem: Clinical Measurements: Goal: Ability to maintain clinical measurements within normal limits will improve 02/19/2020 0957 by Sylvan Cheese, RN Outcome: Progressing 02/19/2020 0952 by Sylvan Cheese,  RN Outcome: Progressing Goal: Will remain free from infection Outcome: Progressing Goal: Diagnostic test results will improve Outcome: Progressing Goal: Respiratory complications will improve 02/19/2020 0957 by Sylvan Cheese, RN Outcome: Progressing 02/19/2020 0952 by Sylvan Cheese, RN Outcome: Progressing Goal: Cardiovascular complication will be avoided Outcome: Progressing   Problem: Activity: Goal: Risk for activity intolerance will decrease 02/19/2020 0957 by Sylvan Cheese, RN Outcome: Progressing 02/19/2020 0952 by Sylvan Cheese, RN Outcome: Progressing   Problem: Nutrition: Goal: Adequate nutrition will be maintained 02/19/2020 0957 by Sylvan Cheese, RN Outcome: Progressing 02/19/2020 0952 by Sylvan Cheese, RN Outcome: Progressing   Problem: Coping: Goal: Level of anxiety will decrease 02/19/2020 0957 by Sylvan Cheese, RN Outcome: Progressing 02/19/2020 0952 by Sylvan Cheese, RN Outcome: Progressing   Problem: Elimination: Goal: Will not experience complications related to bowel motility Outcome: Progressing Goal: Will not experience complications related to urinary retention Outcome: Progressing   Problem: Pain Managment: Goal: General experience of comfort will improve Outcome: Progressing   Problem: Safety: Goal: Ability to remain free from injury will improve Outcome: Progressing   Problem: Skin Integrity: Goal: Risk for impaired skin integrity will decrease Outcome: Progressing

## 2020-02-19 NOTE — Progress Notes (Addendum)
[place] COMPREHENSIVE PROGRESS NOTE  Misty Murillo is a 30 y.o. G3P1011 with dichorionic, diamniotic twin gestation at [redacted]w[redacted]d who is admitted for COVID pneumonia.  Estimated Date of Delivery: 07/01/20  Length of Stay:  5 Days. Admitted 02/14/2020  Subjective: Feeling better today. Sitting up in bed. Currently on 3L of oxygen via [place].Marland Kitchen  Patient reports some fetal movement.  She reports no uterine contractions, no bleeding and no loss of fluid per vagina.  Vitals:  Blood pressure 109/79, pulse 99, temperature (!) 97.5 F (36.4 C), temperature source Oral, resp. rate 18, last menstrual period 09/25/2019, SpO2 98 %, unknown if currently breastfeeding. Physical Examination: CONSTITUTIONAL:No apparent distress. SKIN: Skin is warm and dry. No rash noted. Not diaphoretic. No erythema. No pallor. [place]: Alert and oriented to person, place, and time. Normal reflexes, muscle tone coordination. No cranial nerve deficit noted. PSYCHIATRIC: Normal mood and affect. Normal behavior. Normal judgment and thought content. CARDIOVASCULAR: Elevated heart rate noted, regular rhythm RESPIRATORY: No observed distress while breathing, [place] in place MUSCULOSKELETAL: Normal range of motion. No edema and no tenderness. 2+ distal pulses. ABDOMEN: Soft, nontender, nondistended, gravid. CERVIX:  Deferred  Fetal monitoring: FHR: 150 bpm and 158 bpm Uterine activity: None reported  Results for orders placed or performed during the hospital encounter of 02/14/20 (from the past 48 hour(s))  Basic metabolic panel     Status: Abnormal   Collection Time: 02/17/20  3:58 PM  Result Value Ref Range   Sodium 136 135 - 145 mmol/L   Potassium 2.9 (L) 3.5 - 5.1 mmol/L   Chloride 104 98 - 111 mmol/L   CO2 20 (L) 22 - 32 mmol/L   Glucose, Bld 146 (H) 70 - 99 mg/dL    Comment: Glucose reference range applies only to samples taken after fasting for at least 8 hours.   BUN <5 (L) 6 - 20 mg/dL   Creatinine, Ser  0.52 0.44 - 1.00 mg/dL   Calcium 8.3 (L) 8.9 - 10.3 mg/dL   GFR, Estimated >60 >60 mL/min    Comment: (NOTE) Calculated using the CKD-EPI Creatinine Equation (2021)    Anion gap 12 5 - 15    Comment: Performed at [facility] [address] [place], [place] [address]  CBC with Differential/Platelet     Status: Abnormal   Collection Time: 02/18/20  5:59 AM  Result Value Ref Range   WBC 8.3 4.0 - 10.5 K/uL   RBC 3.01 (L) 3.87 - 5.11 MIL/uL   Hemoglobin 8.9 (L) 12.0 - 15.0 g/dL   HCT 25.3 (L) 36.0 - 46.0 %   MCV 84.1 80.0 - 100.0 fL   MCH 29.6 26.0 - 34.0 pg   MCHC 35.2 30.0 - 36.0 g/dL   RDW 13.8 11.5 - 15.5 %   Platelets 323 150 - 400 K/uL   nRBC 0.5 (H) 0.0 - 0.2 %   Neutrophils Relative % 89 %   Neutro Abs 7.4 1.7 - 7.7 K/uL   Lymphocytes Relative 5 %   Lymphs Abs 0.4 (L) 0.7 - 4.0 K/uL   Monocytes Relative 6 %   Monocytes Absolute 0.5 0.1 - 1.0 K/uL   Eosinophils Relative 0 %   Eosinophils Absolute 0.0 0.0 - 0.5 K/uL   Basophils Relative 0 %   Basophils Absolute 0.0 0.0 - 0.1 K/uL   WBC Morphology See Note     Comment: Mild Left Shift. 1 to 5% Metas and Myelos, Occ Pro Noted.   nRBC 3 (  H) 0 /100 WBC   Abs Immature Granulocytes 0.00 0.00 - 0.07 K/uL   Polychromasia PRESENT     Comment: Performed at [facility], [place] [address] [place], [place] [address]  Comprehensive metabolic panel     Status: Abnormal   Collection Time: 02/18/20  5:59 AM  Result Value Ref Range   Sodium 138 135 - 145 mmol/L   Potassium 3.1 (L) 3.5 - 5.1 mmol/L   Chloride 105 98 - 111 mmol/L   CO2 22 22 - 32 mmol/L   Glucose, Bld 135 (H) 70 - 99 mg/dL    Comment: Glucose reference range applies only to samples taken after fasting for at least 8 hours.   BUN <5 (L) 6 - 20 mg/dL   Creatinine, Ser 0.50 0.44 - 1.00 mg/dL   Calcium 8.0 (L) 8.9 - 10.3 mg/dL   Total Protein 5.5 (L) 6.5 - 8.1 g/dL   Albumin 2.1 (L) 3.5 - 5.0 g/dL   AST 113 (H) 15 - 41 U/L   ALT 61 (H) 0 - 44 U/L    Alkaline Phosphatase 100 38 - 126 U/L   Total Bilirubin 1.1 0.3 - 1.2 mg/dL   GFR, Estimated >60 >60 mL/min    Comment: (NOTE) Calculated using the CKD-EPI Creatinine Equation (2021)    Anion gap 11 5 - 15    Comment: Performed at [facility], [place] [address], [place], [place] [address]  C-reactive protein     Status: Abnormal   Collection Time: 02/18/20  5:59 AM  Result Value Ref Range   CRP 1.4 (H) <1.0 mg/dL    Comment: Performed at [facility] [address], [place], [place] [address]  D-dimer, quantitative (not at [facility])     Status: Abnormal   Collection Time: 02/18/20  5:59 AM  Result Value Ref Range   D-Dimer, Quant 0.86 (H) 0.00 - 0.50 ug/mL-FEU    Comment: (NOTE) At the manufacturer cut-off value of 0.5 g/mL FEU, this assay has a negative predictive value of 95-100%.This assay is intended for use in conjunction with a clinical pretest probability (PTP) assessment model to exclude pulmonary embolism (PE) and deep venous thrombosis (DVT) in outpatients suspected of PE or DVT. Results should be correlated with clinical presentation. Performed at [facility], [place] [address], [place], [place] [id number]   Basic metabolic panel     Status: Abnormal   Collection Time: 02/18/20  3:49 PM  Result Value Ref Range   Sodium 136 135 - 145 mmol/L   Potassium 3.0 (L) 3.5 - 5.1 mmol/L   Chloride 102 98 - 111 mmol/L   CO2 21 (L) 22 - 32 mmol/L   Glucose, Bld 165 (H) 70 - 99 mg/dL    Comment: Glucose reference range applies only to samples taken after fasting for at least 8 hours.   BUN <5 (L) 6 - 20 mg/dL   Creatinine, Ser 0.52 0.44 - 1.00 mg/dL   Calcium 8.0 (L) 8.9 - 10.3 mg/dL   GFR, Estimated >60 >60 mL/min    Comment: (NOTE) Calculated using the CKD-EPI Creatinine Equation (2021)    Anion gap 13 5 - 15    Comment: Performed at [facility] [address], [place], [place] [address]  Magnesium     Status: None   Collection Time: 02/18/20  3:49 PM   Result Value Ref Range   Magnesium 2.1 1.7 - 2.4 mg/dL    Comment: Performed at  [facility], [place] [address] [place], [place] [address]  CBC with Differential/Platelet     Status: Abnormal   Collection Time: 02/19/20  5:28 AM  Result Value Ref Range   WBC 11.7 (H) 4.0 - 10.5 K/uL   RBC 3.06 (L) 3.87 - 5.11 MIL/uL   Hemoglobin 8.8 (L) 12.0 - 15.0 g/dL   HCT 26.4 (L) 36.0 - 46.0 %   MCV 86.3 80.0 - 100.0 fL   MCH 28.8 26.0 - 34.0 pg   MCHC 33.3 30.0 - 36.0 g/dL   RDW 14.0 11.5 - 15.5 %   Platelets 364 150 - 400 K/uL   nRBC 0.3 (H) 0.0 - 0.2 %   Neutrophils Relative % 94 %   Neutro Abs 11.0 (H) 1.7 - 7.7 K/uL   Lymphocytes Relative 2 %   Lymphs Abs 0.2 (L) 0.7 - 4.0 K/uL   Monocytes Relative 4 %   Monocytes Absolute 0.5 0.1 - 1.0 K/uL   Eosinophils Relative 0 %   Eosinophils Absolute 0.0 0.0 - 0.5 K/uL   Basophils Relative 0 %   Basophils Absolute 0.0 0.0 - 0.1 K/uL   WBC Morphology See Note     Comment: Mild Left Shift. 1 to 5% Metas and Myelos, Occ Pro Noted.   nRBC 0 0 /100 WBC   Abs Immature Granulocytes 0.00 0.00 - 0.07 K/uL    Comment: Performed at [facility], [place] [address] [place], [place] [address]  Comprehensive metabolic panel     Status: Abnormal   Collection Time: 02/19/20  5:28 AM  Result Value Ref Range   Sodium 137 135 - 145 mmol/L   Potassium 3.5 3.5 - 5.1 mmol/L   Chloride 105 98 - 111 mmol/L   CO2 21 (L) 22 - 32 mmol/L   Glucose, Bld 159 (H) 70 - 99 mg/dL    Comment: Glucose reference range applies only to samples taken after fasting for at least 8 hours.   BUN <5 (L) 6 - 20 mg/dL   Creatinine, Ser 0.49 0.44 - 1.00 mg/dL   Calcium 8.1 (L) 8.9 - 10.3 mg/dL   Total Protein 5.7 (L) 6.5 - 8.1 g/dL   Albumin 2.2 (L) 3.5 - 5.0 g/dL   AST 153 (H) 15 - 41 U/L   ALT 130 (H) 0 - 44 U/L   Alkaline Phosphatase 89 38 - 126 U/L   Total Bilirubin 0.8 0.3 - 1.2 mg/dL   GFR, Estimated >60 >60 mL/min    Comment: (NOTE) Calculated using the CKD-EPI  Creatinine Equation (2021)    Anion gap 11 5 - 15    Comment: Performed at [facility], [place] [address], [place], [place] [address]  C-reactive protein     Status: None   Collection Time: 02/19/20  5:28 AM  Result Value Ref Range   CRP 0.7 <1.0 mg/dL    Comment: Performed at [facility] [address], [place], [place] [address]  D-dimer, quantitative (not at [facility])     Status: Abnormal   Collection Time: 02/19/20  5:28 AM  Result Value Ref Range   D-Dimer, Quant 0.86 (H) 0.00 - 0.50 ug/mL-FEU    Comment: (NOTE) At the manufacturer cut-off value of 0.5 g/mL FEU, this assay has a negative predictive value of 95-100%.This assay is intended for use in conjunction with a clinical pretest probability (PTP) assessment model to exclude pulmonary embolism (PE) and deep venous thrombosis (DVT) in outpatients suspected of PE  or DVT. Results should be correlated with clinical presentation. Performed at [facility], [place] [address], [place], [place] [address]   Type and screen [place]     Status: None   Collection Time: 02/19/20  7:25 AM  Result Value Ref Range   ABO/RH(D) O POS    Antibody Screen NEG    Sample Expiration      02/22/2020,2359 Performed at [facility], [place] [address] [place], [place] [address]     No results found.  Current scheduled medications . vitamin C  500 mg Oral Daily  . aspirin  81 mg Oral Daily  . docusate sodium  100 mg Oral Daily  . enoxaparin (LOVENOX) injection  50 mg Subcutaneous Q24H  . famotidine  20 mg Oral BID  . ondansetron (ZOFRAN) IV  4 mg Intravenous Q6H  . polyethylene glycol  17 g Oral Daily  . [START ON 02/20/2020] predniSONE  50 mg Oral Q breakfast  . prenatal vitamin w/FE, FA  1 tablet Oral Q1200  . zinc sulfate  220 mg Oral Daily    I have reviewed the patient's current medications.  ASSESSMENT: Principal Problem:   Pneumonia due to COVID-19 virus Active Problems:   Dichorionic  diamniotic twin gestation   Sepsis ([place])   Acute hypoxemic respiratory failure (HCC)   Intractable nausea and vomiting   Diarrhea   [redacted] weeks gestation of pregnancy  PLAN: Appreciate TRH co-management, appreciate the recommendations Continue supplemental oxygen as needed to maintain SpO2 >95%, wean as tolerated Continue Dexamethasone, has completed Remdesivir Continue prophylactic Lovenox for VTE prophylaxis Reassuring fetal status x 2, no acute obstetric concerns. Daily dopplers for FHR checks. Continue routine antenatal care.   Verita Schneiders, MD, [place] for [employer], [place]

## 2020-02-20 ENCOUNTER — Inpatient Hospital Stay (HOSPITAL_COMMUNITY): Payer: Medicaid Other

## 2020-02-20 DIAGNOSIS — R1115 Cyclical vomiting syndrome unrelated to migraine: Secondary | ICD-10-CM

## 2020-02-20 LAB — COMPREHENSIVE METABOLIC PANEL
ALT: 182 U/L — ABNORMAL HIGH (ref 0–44)
AST: 169 U/L — ABNORMAL HIGH (ref 15–41)
Albumin: 2.1 g/dL — ABNORMAL LOW (ref 3.5–5.0)
Alkaline Phosphatase: 86 U/L (ref 38–126)
Anion gap: 10 (ref 5–15)
BUN: <5 mg/dL — ABNORMAL LOW (ref 6–20)
CO2: 24 mmol/L (ref 22–32)
Calcium: 8.3 mg/dL — ABNORMAL LOW (ref 8.9–10.3)
Chloride: 104 mmol/L (ref 98–111)
Creatinine, Ser: 0.42 mg/dL — ABNORMAL LOW (ref 0.44–1.00)
GFR, Estimated: >60 mL/min (ref >60)
Glucose, Bld: 98 mg/dL (ref 70–99)
Potassium: 2.8 mmol/L — ABNORMAL LOW (ref 3.5–5.1)
Sodium: 138 mmol/L (ref 135–145)
Total Bilirubin: 0.6 mg/dL (ref 0.3–1.2)
Total Protein: 5.6 g/dL — ABNORMAL LOW (ref 6.5–8.1)

## 2020-02-20 LAB — BASIC METABOLIC PANEL
Anion gap: 10 (ref 5–15)
BUN: <5 mg/dL — ABNORMAL LOW (ref 6–20)
CO2: 24 mmol/L (ref 22–32)
Calcium: 8.4 mg/dL — ABNORMAL LOW (ref 8.9–10.3)
Chloride: 102 mmol/L (ref 98–111)
Creatinine, Ser: 0.56 mg/dL (ref 0.44–1.00)
GFR, Estimated: >60 mL/min (ref >60)
Glucose, Bld: 129 mg/dL — ABNORMAL HIGH (ref 70–99)
Potassium: 3.4 mmol/L — ABNORMAL LOW (ref 3.5–5.1)
Sodium: 136 mmol/L (ref 135–145)

## 2020-02-20 LAB — POTASSIUM: Potassium: 3.6 mmol/L (ref 3.5–5.1)

## 2020-02-20 MED ORDER — PREDNISONE 5 MG/ML PO CONC
50.0000 mg | Freq: Every day | ORAL | Status: DC
  Administered 2020-02-20 – 2020-02-22 (×3): 50 mg via ORAL
  Filled 2020-02-20 (×3): qty 10

## 2020-02-20 MED ORDER — POTASSIUM CHLORIDE 10 MEQ/100ML IV SOLN
10.0000 meq | INTRAVENOUS | Status: AC
  Administered 2020-02-20 (×3): 10 meq via INTRAVENOUS
  Filled 2020-02-20 (×3): qty 100

## 2020-02-20 MED ORDER — POTASSIUM CHLORIDE CRYS ER 20 MEQ PO TBCR
40.0000 meq | EXTENDED_RELEASE_TABLET | Freq: Two times a day (BID) | ORAL | Status: DC
  Administered 2020-02-20 (×2): 40 meq via ORAL
  Filled 2020-02-20 (×3): qty 2

## 2020-02-20 NOTE — Progress Notes (Signed)
Patient tolerating 1L/min O2NC well at 96%. RN removed [place] for 30 min this AM ([id number]) with ambulation in room and to bathroom. Patient O2 93% on room air. [place] reapplied at 1L/min, and O2 saturation returned to 97%. Patient eating 75% of meals and tolerating solids well. Plan of care discussed with patient. Patient had no questions.

## 2020-02-20 NOTE — Progress Notes (Signed)
Patient ID: Misty Murillo, female   DOB: 10/16/1990, 30 y.o.   MRN: [id number] [place] COMPREHENSIVE PROGRESS NOTE  Misty Murillo is a 30 y.o. G3P1011 with dichorionic, diamniotic twin gestation at [redacted]w[redacted]d who is admitted for COVID pneumonia.  Estimated Date of Delivery: 07/01/20  Length of Stay:  6 Days. Admitted 02/14/2020  Subjective: Feeling better today. Sitting up in bed. Currently on 1L of oxygen via [place]. Patient ambulates without shortness of breath and desaturates to 92% on RA Patient reports fetal movement and denies uterine contractions, bleeding and loss of fluid per vagina.  Vitals:  Blood pressure 116/68, pulse 97, temperature 97.6 F (36.4 C), temperature source Oral, resp. rate 16, last menstrual period 09/25/2019, SpO2 95 %, unknown if currently breastfeeding. Physical Examination: CONSTITUTIONAL:No apparent distress. PSYCHIATRIC: Normal mood and affect. Normal behavior. Normal judgment and thought content. CARDIOVASCULAR: Elevated heart rate noted, regular rhythm RESPIRATORY: No observed distress while breathing, [place] in place MUSCULOSKELETAL: Normal range of motion. No edema and no tenderness. 2+ distal pulses. ABDOMEN: Soft, nontender, nondistended, gravid. CERVIX:  Deferred  Fetal monitoring: FHR: 150 bpm and 158 bpm Uterine activity: None reported  Results for orders placed or performed during the hospital encounter of 02/14/20 (from the past 48 hour(s))  Basic metabolic panel     Status: Abnormal   Collection Time: 02/18/20  3:49 PM  Result Value Ref Range   Sodium 136 135 - 145 mmol/L   Potassium 3.0 (L) 3.5 - 5.1 mmol/L   Chloride 102 98 - 111 mmol/L   CO2 21 (L) 22 - 32 mmol/L   Glucose, Bld 165 (H) 70 - 99 mg/dL    Comment: Glucose reference range applies only to samples taken after fasting for at least 8 hours.   BUN <5 (L) 6 - 20 mg/dL   Creatinine, Ser 0.52 0.44 - 1.00 mg/dL   Calcium 8.0 (L) 8.9 - 10.3 mg/dL   GFR, Estimated >60 >60 mL/min     Comment: (NOTE) Calculated using the CKD-EPI Creatinine Equation (2021)    Anion gap 13 5 - 15    Comment: Performed at [facility] [address], [place], [place] [address]  Magnesium     Status: None   Collection Time: 02/18/20  3:49 PM  Result Value Ref Range   Magnesium 2.1 1.7 - 2.4 mg/dL    Comment: Performed at [facility], [place] [address], [place], [place] [address]  CBC with Differential/Platelet     Status: Abnormal   Collection Time: 02/19/20  5:28 AM  Result Value Ref Range   WBC 11.7 (H) 4.0 - 10.5 K/uL   RBC 3.06 (L) 3.87 - 5.11 MIL/uL   Hemoglobin 8.8 (L) 12.0 - 15.0 g/dL   HCT 26.4 (L) 36.0 - 46.0 %   MCV 86.3 80.0 - 100.0 fL   MCH 28.8 26.0 - 34.0 pg   MCHC 33.3 30.0 - 36.0 g/dL   RDW 14.0 11.5 - 15.5 %   Platelets 364 150 - 400 K/uL   nRBC 0.3 (H) 0.0 - 0.2 %   Neutrophils Relative % 94 %   Neutro Abs 11.0 (H) 1.7 - 7.7 K/uL   Lymphocytes Relative 2 %   Lymphs Abs 0.2 (L) 0.7 - 4.0 K/uL   Monocytes Relative 4 %   Monocytes Absolute 0.5 0.1 - 1.0 K/uL   Eosinophils Relative 0 %   Eosinophils Absolute 0.0 0.0 - 0.5 K/uL   Basophils Relative 0 %  Basophils Absolute 0.0 0.0 - 0.1 K/uL   WBC Morphology See Note     Comment: Mild Left Shift. 1 to 5% Metas and Myelos, Occ Pro Noted.   nRBC 0 0 /100 WBC   Abs Immature Granulocytes 0.00 0.00 - 0.07 K/uL    Comment: Performed at [facility], [place] [address] [place], [place] [address]  Comprehensive metabolic panel     Status: Abnormal   Collection Time: 02/19/20  5:28 AM  Result Value Ref Range   Sodium 137 135 - 145 mmol/L   Potassium 3.5 3.5 - 5.1 mmol/L   Chloride 105 98 - 111 mmol/L   CO2 21 (L) 22 - 32 mmol/L   Glucose, Bld 159 (H) 70 - 99 mg/dL    Comment: Glucose reference range applies only to samples taken after fasting for at least 8 hours.   BUN <5 (L) 6 - 20 mg/dL   Creatinine, Ser 0.49 0.44 - 1.00 mg/dL   Calcium 8.1 (L) 8.9 - 10.3 mg/dL   Total Protein 5.7 (L) 6.5 -  8.1 g/dL   Albumin 2.2 (L) 3.5 - 5.0 g/dL   AST 153 (H) 15 - 41 U/L   ALT 130 (H) 0 - 44 U/L   Alkaline Phosphatase 89 38 - 126 U/L   Total Bilirubin 0.8 0.3 - 1.2 mg/dL   GFR, Estimated >60 >60 mL/min    Comment: (NOTE) Calculated using the CKD-EPI Creatinine Equation (2021)    Anion gap 11 5 - 15    Comment: Performed at [facility], [place] [address] [place], [place] [address]  C-reactive protein     Status: None   Collection Time: 02/19/20  5:28 AM  Result Value Ref Range   CRP 0.7 <1.0 mg/dL    Comment: Performed at [facility] [address] [place], [place] [address]  D-dimer, quantitative (not at [facility])     Status: Abnormal   Collection Time: 02/19/20  5:28 AM  Result Value Ref Range   D-Dimer, Quant 0.86 (H) 0.00 - 0.50 ug/mL-FEU    Comment: (NOTE) At the manufacturer cut-off value of 0.5 g/mL FEU, this assay has a negative predictive value of 95-100%.This assay is intended for use in conjunction with a clinical pretest probability (PTP) assessment model to exclude pulmonary embolism (PE) and deep venous thrombosis (DVT) in outpatients suspected of PE or DVT. Results should be correlated with clinical presentation. Performed at [facility], [place] [address] [place], [place] [address]   Type and screen [place]     Status: None   Collection Time: 02/19/20  7:25 AM  Result Value Ref Range   ABO/RH(D) O POS    Antibody Screen NEG    Sample Expiration      02/22/2020,2359 Performed at [facility], [place] [address], [place], [place] [address]   Comprehensive metabolic panel     Status: Abnormal   Collection Time: 02/20/20  4:23 AM  Result Value Ref Range   Sodium 138 135 - 145 mmol/L   Potassium 2.8 (L) 3.5 - 5.1 mmol/L   Chloride 104 98 - 111 mmol/L   CO2 24 22 - 32 mmol/L   Glucose, Bld 98 70 - 99 mg/dL    Comment: Glucose reference range applies only to samples taken after fasting for at least 8 hours.   BUN <5 (L) 6  - 20 mg/dL   Creatinine, Ser 0.42 (L) 0.44 - 1.00 mg/dL  Calcium 8.3 (L) 8.9 - 10.3 mg/dL   Total Protein 5.6 (L) 6.5 - 8.1 g/dL   Albumin 2.1 (L) 3.5 - 5.0 g/dL   AST 169 (H) 15 - 41 U/L   ALT 182 (H) 0 - 44 U/L   Alkaline Phosphatase 86 38 - 126 U/L   Total Bilirubin 0.6 0.3 - 1.2 mg/dL   GFR, Estimated >60 >60 mL/min    Comment: (NOTE) Calculated using the CKD-EPI Creatinine Equation (2021)    Anion gap 10 5 - 15    Comment: Performed at [facility] [address], [place], [place] [address]    No results found.  Current scheduled medications . vitamin C  500 mg Oral Daily  . aspirin  81 mg Oral Daily  . docusate sodium  100 mg Oral Daily  . enoxaparin (LOVENOX) injection  50 mg Subcutaneous Q24H  . famotidine  20 mg Oral BID  . ondansetron (ZOFRAN) IV  4 mg Intravenous Q6H  . polyethylene glycol  17 g Oral Daily  . predniSONE  50 mg Oral Q breakfast  . prenatal vitamin w/FE, FA  1 tablet Oral Q1200  . zinc sulfate  220 mg Oral Daily    I have reviewed the patient's current medications.  ASSESSMENT: Principal Problem:   Pneumonia due to COVID-19 virus Active Problems:   Dichorionic diamniotic twin gestation   Sepsis ([place])   Acute hypoxemic respiratory failure (HCC)   Intractable nausea and vomiting   Diarrhea   [redacted] weeks gestation of pregnancy  PLAN: Continue supplemental oxygen as needed to maintain SpO2 >95%, wean as tolerated Continue Dexamethasone, has completed Remdesivir Continue prophylactic Lovenox for VTE prophylaxis Appreciate TRH co-management and recommendations Reassuring fetal status x 2, no acute obstetric concerns. Daily dopplers for FHR checks. Continue routine antenatal care.   Mora Bellman, MD, [place] for [employer], [place]

## 2020-02-20 NOTE — Progress Notes (Addendum)
Patient 98% on room air. Patient eating 100% of meals and ambulating.

## 2020-02-20 NOTE — Progress Notes (Signed)
PROGRESS NOTE    Misty Murillo  VQQ:[id number] DOB: 10/04/1990 DOA: 02/14/2020 PCP: Orma Flaming, MD    Brief Narrative:  30 y.o. female [phone] '@[redacted]w[redacted]d'  gestation with twins, Graves' disease, depression, anxiety, history of PID presented to [facility] complaining of symptoms of an acute viral illness.  She tested positive for COVID-19 on 1/21 during ED visit and then returned on 1/23 and was diagnosed with Covid pneumonia.  On arrival to [facility]U today, patient was febrile, tachycardic, tachypneic, and SPO2 80% on room air when supine.  Not hypotensive.  WBC 6.6, hemoglobin 10.2 (stable compared to recent labs), platelet count 220K.  Sodium 131, potassium 4.3, chloride 99, bicarb 20, anion gap 12, BUN <5, creatinine 0.6, glucose 112.  AST 82, ALT 36, alk phos 93, T bili 2.2.  Lipase normal. She was given Tylenol, Zofran, and 1 L LR bolus.  Patient is not vaccinated against Covid. She reports 1 week history of body aches, fatigue, cough, shortness of breath, nausea, vomiting, and diarrhea. States she is not able to tolerate any p.o. intake. She is also having discomfort in her lower abdomen. States she has Graves' disease for which she is seen by endocrinologist Dr. Dwyane Dee and currently not on any treatments as her thyroid function labs were normal during recent office visit and her endocrinologist told her he will continue to monitor her labs periodically. No other complaints.  Assessment & Plan:   Principal Problem:   Pneumonia due to COVID-19 virus Active Problems:   Dichorionic diamniotic twin gestation   Sepsis ([place])   Acute hypoxemic respiratory failure (HCC)   Intractable nausea and vomiting   Diarrhea   [redacted] weeks gestation of pregnancy  Sepsis and acute hypoxemic respiratory failure secondary to COVID-19 viral pneumonia: Meets criteria for sepsis -3 SIRS (fever, tachycardia, tachypnea) and SARS-CoV-2 PCR test positive on 02/10/2020.  No hypotension to suggest severe sepsis.   -Chest x-ray done  02/12/2020 showing new focal consolidation in the right midlung.  . -Pt completed course of remdesivir. Remains on steroids, anticipate total 10 days of steroids, 3 more days after today -Chest CT without PE -CRP is down peaked to 8.0, now down to 0.7 as of 1/30 -Recently needing increased O2 requirements peaking to 11L -Pt now s/p proning and IS use with O2 requirements down to room air this afternoon  Intractable nausea and vomiting Diarrhea Suspect symptoms are due to COVID-19 viral gastroenteritis.  AST 82, ALT 36, alk phos 93, T bili 2.2.  Lipase normal.  Mild elevation of LFTs likely due to COVID-19 viral infection.   -remains stable  Mild hyponatremia: Likely due to poor oral intake/vomiting/diarrhea.  Sodium normalized -continued to encourage hydration -normalized  Mild normal anion gap metabolic acidosis: Likely due to diarrhea.  Bicarb 20, anion gap 12. -Encourage hydration as tolerated  Tachycardia: TSH and free T4 checked on 01/31/2020 were normal. -Pt is continued on heart monitor  History of Graves' disease: Patient is followed by endocrinology and recent thyroid function tests were normal. -Recommend f/u with Endocrine as outpatient   Pregnancy (60w2dgestation with twins) -Management per primary team.  Hypokalemia -K today 2.8 -will replace -Of note, pt has been refusing potassium this admit. Pt advised to remain compliant with potassium replacement -Recheck bmet in AM  Elevated LFT's -Recent hepatitis panel is neg -Will check RUQ [place] Pt is tolerating diet without issues  DVT prophylaxis: Lovenox subq Code Status: Full Family Communication: Pt in room, family no at bedside  Status is: Inpatient  Remains inpatient appropriate because:Unsafe d/c plan, IV treatments appropriate due to intensity of illness or inability to take PO and Inpatient level of care appropriate due to severity of illness   Dispo: The patient is from: Home               Anticipated d/c is to: Home              Anticipated d/c date is: 3 days              Patient currently is not medically stable to d/c.   Difficult to place patient No   Consultants:     Procedures:     Antimicrobials: Anti-infectives (From admission, onward)   Start     Dose/Rate Route Frequency Ordered Stop   02/15/20 1000  remdesivir 100 mg in sodium chloride 0.9 % 100 mL IVPB       "Followed by" Linked Group Details   100 mg 200 mL/hr over 30 Minutes Intravenous Daily 02/14/20 2034 02/18/20 0906   02/14/20 2130  remdesivir 200 mg in sodium chloride 0.9% 250 mL IVPB       "Followed by" Linked Group Details   200 mg 580 mL/hr over 30 Minutes Intravenous Once 02/14/20 2034 02/14/20 2202      Subjective: Reports feeling much better today  Objective: Vitals:   02/20/20 1220 02/20/20 1225 02/20/20 1230 02/20/20 1235  BP:    115/68  Pulse:    (!) 103  Resp:    17  Temp:    97.9 F (36.6 C)  TempSrc:    Oral  SpO2: 96% 96% 97% 96%    Intake/Output Summary (Last 24 hours) at 02/20/2020 1549 Last data filed at 02/20/2020 0820 Gross per 24 hour  Intake 240 ml  Output 1100 ml  Net -860 ml   There were no vitals filed for this visit.  Examination: General exam: Conversant, in no acute distress Respiratory system: normal chest rise, clear, no audible wheezing Cardiovascular system: regular rhythm, s1-s2 Gastrointestinal system: Nondistended, nontender, pos BS Central nervous system: No seizures, no tremors Extremities: No cyanosis, no joint deformities Skin: No rashes, no pallor Psychiatry: Affect normal // no auditory hallucinations    Data Reviewed: I have personally reviewed following labs and imaging studies  CBC: Recent Labs  Lab 02/15/20 0727 02/16/20 0350 02/17/20 0733 02/18/20 0559 02/19/20 0528  WBC 5.6 5.9 6.6 8.3 11.7*  NEUTROABS 4.5 4.7 4.9 7.4 11.0*  HGB 8.7* 8.8* 9.5* 8.9* 8.8*  HCT 25.5* 24.5* 26.8* 25.3* 26.4*  MCV 83.6 83.3 83.8 84.1  86.3  PLT 197 214 296 323 956   Basic Metabolic Panel: Recent Labs  Lab 02/15/20 0727 02/16/20 0350 02/18/20 0559 02/18/20 1549 02/19/20 0528 02/20/20 0423 02/20/20 1420  NA 135   < > 138 136 137 138 136  K 2.7*   < > 3.1* 3.0* 3.5 2.8* 3.4*  CL 102   < > 105 102 105 104 102  CO2 21*   < > 22 21* 21* 24 24  GLUCOSE 109*   < > 135* 165* 159* 98 129*  BUN <5*   < > <5* <5* <5* <5* <5*  CREATININE 0.53   < > 0.50 0.52 0.49 0.42* 0.56  CALCIUM 8.3*   < > 8.0* 8.0* 8.1* 8.3* 8.4*  MG 1.8  --   --  2.1  --   --   --    < > = values in this interval not displayed.  GFR: Estimated Creatinine Clearance: 122 mL/min (by C-G formula based on SCr of 0.56 mg/dL). Liver Function Tests: Recent Labs  Lab 02/16/20 0350 02/17/20 0733 02/18/20 0559 02/19/20 0528 02/20/20 0423  AST 48* 55* 113* 153* 169*  ALT 28 31 61* 130* 182*  ALKPHOS 90 103 100 89 86  BILITOT 0.8 1.7* 1.1 0.8 0.6  PROT 5.4* 5.9* 5.5* 5.7* 5.6*  ALBUMIN 1.9* 2.1* 2.1* 2.2* 2.1*   Recent Labs  Lab 02/14/20 1655  LIPASE 21   No results for input(s): AMMONIA in the last 168 hours. Coagulation Profile: Recent Labs  Lab 02/15/20 0022  INR 1.1   Cardiac Enzymes: No results for input(s): CKTOTAL, CKMB, CKMBINDEX, TROPONINI in the last 168 hours. BNP (last 3 results) No results for input(s): PROBNP in the last 8760 hours. HbA1C: No results for input(s): HGBA1C in the last 72 hours. CBG: No results for input(s): GLUCAP in the last 168 hours. Lipid Profile: No results for input(s): CHOL, HDL, LDLCALC, TRIG, CHOLHDL, LDLDIRECT in the last 72 hours. Thyroid Function Tests: No results for input(s): TSH, T4TOTAL, FREET4, T3FREE, THYROIDAB in the last 72 hours. Anemia Panel: No results for input(s): VITAMINB12, FOLATE, FERRITIN, TIBC, IRON, RETICCTPCT in the last 72 hours. Sepsis Labs: Recent Labs  Lab 02/14/20 2057  PROCALCITON 0.21  LATICACIDVEN 1.1    Recent Results (from the past 240 hour(s))  Culture,  blood (Routine X 2) w Reflex to ID Panel     Status: None   Collection Time: 02/14/20  8:57 PM   Specimen: BLOOD  Result Value Ref Range Status   Specimen Description BLOOD RIGHT ANTECUBITAL  Final   Special Requests   Final    BOTTLES DRAWN AEROBIC AND ANAEROBIC Blood Culture adequate volume   Culture   Final    NO GROWTH 5 DAYS Performed at [facility], [address], [place], [place] [address]    Report Status 02/19/2020 FINAL  Final  Culture, blood (Routine X 2) w Reflex to ID Panel     Status: None   Collection Time: 02/14/20  8:57 PM   Specimen: BLOOD RIGHT HAND  Result Value Ref Range Status   Specimen Description BLOOD RIGHT HAND  Final   Special Requests   Final    BOTTLES DRAWN AEROBIC AND ANAEROBIC Blood Culture adequate volume   Culture   Final    NO GROWTH 5 DAYS Performed at [facility], [place] [address] [place], [place] [address]    Report Status 02/19/2020 FINAL  Final     Radiology Studies: No results found.  Scheduled Meds: . vitamin C  500 mg Oral Daily  . aspirin  81 mg Oral Daily  . docusate sodium  100 mg Oral Daily  . enoxaparin (LOVENOX) injection  50 mg Subcutaneous Q24H  . famotidine  20 mg Oral BID  . ondansetron (ZOFRAN) IV  4 mg Intravenous Q6H  . polyethylene glycol  17 g Oral Daily  . potassium chloride  40 mEq Oral BID  . predniSONE  50 mg Oral Q breakfast  . prenatal vitamin w/FE, FA  1 tablet Oral Q1200  . zinc sulfate  220 mg Oral Daily   Continuous Infusions:  Time spent:32mn   LOS: 6 days   SMarylu Lund MD [facility] Pager On Amion  If 7PM-7AM, please contact night-coverage 02/20/2020, 3:49 PM

## 2020-02-21 LAB — COMPREHENSIVE METABOLIC PANEL
ALT: 204 U/L — ABNORMAL HIGH (ref 0–44)
AST: 145 U/L — ABNORMAL HIGH (ref 15–41)
Albumin: 2.1 g/dL — ABNORMAL LOW (ref 3.5–5.0)
Alkaline Phosphatase: 84 U/L (ref 38–126)
Anion gap: 10 (ref 5–15)
BUN: <5 mg/dL — ABNORMAL LOW (ref 6–20)
CO2: 20 mmol/L — ABNORMAL LOW (ref 22–32)
Calcium: 8.2 mg/dL — ABNORMAL LOW (ref 8.9–10.3)
Chloride: 103 mmol/L (ref 98–111)
Creatinine, Ser: 0.46 mg/dL (ref 0.44–1.00)
GFR, Estimated: >60 mL/min (ref >60)
Glucose, Bld: 142 mg/dL — ABNORMAL HIGH (ref 70–99)
Potassium: 2.9 mmol/L — ABNORMAL LOW (ref 3.5–5.1)
Sodium: 133 mmol/L — ABNORMAL LOW (ref 135–145)
Total Bilirubin: 0.4 mg/dL (ref 0.3–1.2)
Total Protein: 5.5 g/dL — ABNORMAL LOW (ref 6.5–8.1)

## 2020-02-21 LAB — POTASSIUM: Potassium: 3.4 mmol/L — ABNORMAL LOW (ref 3.5–5.1)

## 2020-02-21 MED ORDER — POTASSIUM CHLORIDE CRYS ER 20 MEQ PO TBCR
60.0000 meq | EXTENDED_RELEASE_TABLET | ORAL | Status: AC
  Administered 2020-02-21: 60 meq via ORAL
  Filled 2020-02-21: qty 3

## 2020-02-21 MED ORDER — POTASSIUM CHLORIDE CRYS ER 20 MEQ PO TBCR
60.0000 meq | EXTENDED_RELEASE_TABLET | Freq: Two times a day (BID) | ORAL | Status: DC
  Administered 2020-02-21: 60 meq via ORAL
  Filled 2020-02-21: qty 3

## 2020-02-21 MED ORDER — ONDANSETRON HCL 4 MG/2ML IJ SOLN: 4.0000 mg | Freq: Four times a day (QID) | INTRAMUSCULAR | Status: DC | PRN

## 2020-02-21 NOTE — Progress Notes (Signed)
PROGRESS NOTE    Misty Murillo  VOJ:[id number] DOB: 01/24/1990 DOA: 02/14/2020 PCP: Orma Flaming, MD    Brief Narrative:  30 y.o. female [phone] '@[redacted]w[redacted]d'  gestation with twins, Graves' disease, depression, anxiety, history of PID presented to [facility]U complaining of symptoms of an acute viral illness.  She tested positive for COVID-19 on 1/21 during ED visit and then returned on 1/23 and was diagnosed with Covid pneumonia.  On arrival to MAU today, patient was febrile, tachycardic, tachypneic, and SPO2 80% on room air when supine.  Not hypotensive.  WBC 6.6, hemoglobin 10.2 (stable compared to recent labs), platelet count 220K.  Sodium 131, potassium 4.3, chloride 99, bicarb 20, anion gap 12, BUN <5, creatinine 0.6, glucose 112.  AST 82, ALT 36, alk phos 93, T bili 2.2.  Lipase normal. She was given Tylenol, Zofran, and 1 L LR bolus.  Patient is not vaccinated against Covid. She reports 1 week history of body aches, fatigue, cough, shortness of breath, nausea, vomiting, and diarrhea. States she is not able to tolerate any p.o. intake. She is also having discomfort in her lower abdomen. States she has Graves' disease for which she is seen by endocrinologist Dr. Dwyane Dee and currently not on any treatments as her thyroid function labs were normal during recent office visit and her endocrinologist told her he will continue to monitor her labs periodically. No other complaints.  Assessment & Plan:   Principal Problem:   Pneumonia due to COVID-19 virus Active Problems:   Dichorionic diamniotic twin gestation   Sepsis ([place])   Acute hypoxemic respiratory failure (HCC)   Intractable nausea and vomiting   Diarrhea   [redacted] weeks gestation of pregnancy  Sepsis and acute hypoxemic respiratory failure secondary to COVID-19 viral pneumonia: Meets criteria for sepsis -3 SIRS (fever, tachycardia, tachypnea) and SARS-CoV-2 PCR test positive on 02/10/2020.  No hypotension to suggest severe sepsis.   -Chest x-ray done  02/12/2020 showing new focal consolidation in the right midlung.  . -Pt completed course of remdesivir. Remains on steroids -Chest CT without PE -CRP is down peaked to 8.0, now down to 0.7 as of 1/30 -Recently needing increased O2 requirements peaking to 11L -Pt now s/p proning and IS -Now on room air -Recommend 2 more days of steroids after today, can discharge on prednisone 39m daily at time of d/c to complete course  Intractable nausea and vomiting Diarrhea Suspect symptoms are due to COVID-19 viral gastroenteritis.  AST 82, ALT 36, alk phos 93, T bili 2.2.  Lipase normal.  Mild elevation of LFTs likely due to COVID-19 viral infection.   -Currently stable  Mild hyponatremia: Likely due to poor oral intake/vomiting/diarrhea.  Sodium normalized -continued to encourage hydration -sodium normalized  Mild normal anion gap metabolic acidosis: Likely due to diarrhea.  Bicarb 20, anion gap 12. -Encourage hydration as tolerated  Tachycardia: TSH and free T4 checked on 01/31/2020 were normal. -Pt is continued on heart monitor  History of Graves' disease: Patient is followed by endocrinology and recent thyroid function tests were normal. -Recommend f/u with Endocrine as outpatient   Pregnancy (275w2destation with twins) -Management per primary team.  Hypokalemia -K today 2.9 -replaced with repeat K of 3.4 this afternoon -Would repeat bmet in AM, if lytes appear stable, can d/c on potassium supplementation   Elevated LFT's -Recent hepatitis panel is neg - RUQ [place] with findings of stable benign hepatic mass, known from prior imaging, unchaged -. Pt is tolerating diet without issues -LFT's are higher  today, but seems to be beginning to plateau -Discussed with Ob service, who can follow closely as outpatient  Will sign off for now. Please do not hesitate if we can be of any further assistance.  DVT prophylaxis: Lovenox subq Code Status: Full Family Communication: Pt  in room, family no at bedside  Status is: Inpatient  Remains inpatient appropriate because:Unsafe d/c plan, IV treatments appropriate due to intensity of illness or inability to take PO and Inpatient level of care appropriate due to severity of illness   Dispo: The patient is from: Home              Anticipated d/c is to: Home              Anticipated d/c date is: 3 days              Patient currently is not medically stable to d/c.   Difficult to place patient No   Consultants:     Procedures:     Antimicrobials: Anti-infectives (From admission, onward)   Start     Dose/Rate Route Frequency Ordered Stop   02/15/20 1000  remdesivir 100 mg in sodium chloride 0.9 % 100 mL IVPB       "Followed by" Linked Group Details   100 mg 200 mL/hr over 30 Minutes Intravenous Daily 02/14/20 2034 02/18/20 0906   02/14/20 2130  remdesivir 200 mg in sodium chloride 0.9% 250 mL IVPB       "Followed by" Linked Group Details   200 mg 580 mL/hr over 30 Minutes Intravenous Once 02/14/20 2034 02/14/20 2202      Subjective: Feels well. Eager to go home  Objective: Vitals:   02/21/20 1408 02/21/20 1554 02/21/20 1601 02/21/20 1606  BP: 120/67  119/64   Pulse: 98  (!) 101   Resp: 17  16   Temp: 97.7 F (36.5 C)  98.1 F (36.7 C)   TempSrc: Oral  Oral   SpO2:  98% 97% 100%    Intake/Output Summary (Last 24 hours) at 02/21/2020 1704 Last data filed at 02/20/2020 1956 Gross per 24 hour  Intake 480 ml  Output 800 ml  Net -320 ml   There were no vitals filed for this visit.  Examination: General exam: Awake, laying in bed, in nad Respiratory system: Normal respiratory effort, no wheezing Cardiovascular system: regular rate, s1, s2 Gastrointestinal system: Soft, nondistended, positive BS Central nervous system: CN2-12 grossly intact, strength intact Extremities: Perfused, no clubbing Skin: Normal skin turgor, no notable skin lesions seen Psychiatry: Mood normal // no visual  hallucinations   Data Reviewed: I have personally reviewed following labs and imaging studies  CBC: Recent Labs  Lab 02/15/20 0727 02/16/20 0350 02/17/20 0733 02/18/20 0559 02/19/20 0528  WBC 5.6 5.9 6.6 8.3 11.7*  NEUTROABS 4.5 4.7 4.9 7.4 11.0*  HGB 8.7* 8.8* 9.5* 8.9* 8.8*  HCT 25.5* 24.5* 26.8* 25.3* 26.4*  MCV 83.6 83.3 83.8 84.1 86.3  PLT 197 214 296 323 641   Basic Metabolic Panel: Recent Labs  Lab 02/15/20 0727 02/16/20 0350 02/18/20 1549 02/19/20 0528 02/20/20 0423 02/20/20 1420 02/20/20 2032 02/21/20 0505 02/21/20 1546  NA 135   < > 136 137 138 136  --  133*  --   K 2.7*   < > 3.0* 3.5 2.8* 3.4* 3.6 2.9* 3.4*  CL 102   < > 102 105 104 102  --  103  --   CO2 21*   < > 21* 21*  24 24  --  20*  --   GLUCOSE 109*   < > 165* 159* 98 129*  --  142*  --   BUN <5*   < > <5* <5* <5* <5*  --  <5*  --   CREATININE 0.53   < > 0.52 0.49 0.42* 0.56  --  0.46  --   CALCIUM 8.3*   < > 8.0* 8.1* 8.3* 8.4*  --  8.2*  --   MG 1.8  --  2.1  --   --   --   --   --   --    < > = values in this interval not displayed.   GFR: Estimated Creatinine Clearance: 122 mL/min (by C-G formula based on SCr of 0.46 mg/dL). Liver Function Tests: Recent Labs  Lab 02/17/20 0733 02/18/20 0559 02/19/20 0528 02/20/20 0423 02/21/20 0505  AST 55* 113* 153* 169* 145*  ALT 31 61* 130* 182* 204*  ALKPHOS 103 100 89 86 84  BILITOT 1.7* 1.1 0.8 0.6 0.4  PROT 5.9* 5.5* 5.7* 5.6* 5.5*  ALBUMIN 2.1* 2.1* 2.2* 2.1* 2.1*   No results for input(s): LIPASE, AMYLASE in the last 168 hours. No results for input(s): AMMONIA in the last 168 hours. Coagulation Profile: Recent Labs  Lab 02/15/20 0022  INR 1.1   Cardiac Enzymes: No results for input(s): CKTOTAL, CKMB, CKMBINDEX, TROPONINI in the last 168 hours. BNP (last 3 results) No results for input(s): PROBNP in the last 8760 hours. HbA1C: No results for input(s): HGBA1C in the last 72 hours. CBG: No results for input(s): GLUCAP in the last  168 hours. Lipid Profile: No results for input(s): CHOL, HDL, LDLCALC, TRIG, CHOLHDL, LDLDIRECT in the last 72 hours. Thyroid Function Tests: No results for input(s): TSH, T4TOTAL, FREET4, T3FREE, THYROIDAB in the last 72 hours. Anemia Panel: No results for input(s): VITAMINB12, FOLATE, FERRITIN, TIBC, IRON, RETICCTPCT in the last 72 hours. Sepsis Labs: Recent Labs  Lab 02/14/20 2057  PROCALCITON 0.21  LATICACIDVEN 1.1    Recent Results (from the past 240 hour(s))  Culture, blood (Routine X 2) w Reflex to ID Panel     Status: None   Collection Time: 02/14/20  8:57 PM   Specimen: BLOOD  Result Value Ref Range Status   Specimen Description BLOOD RIGHT ANTECUBITAL  Final   Special Requests   Final    BOTTLES DRAWN AEROBIC AND ANAEROBIC Blood Culture adequate volume   Culture   Final    NO GROWTH 5 DAYS Performed at [facility], [address], [place], [place] [address]    Report Status 02/19/2020 FINAL  Final  Culture, blood (Routine X 2) w Reflex to ID Panel     Status: None   Collection Time: 02/14/20  8:57 PM   Specimen: BLOOD RIGHT HAND  Result Value Ref Range Status   Specimen Description BLOOD RIGHT HAND  Final   Special Requests   Final    BOTTLES DRAWN AEROBIC AND ANAEROBIC Blood Culture adequate volume   Culture   Final    NO GROWTH 5 DAYS Performed at [facility], [place] [address], [place], [place] [address]    Report Status 02/19/2020 FINAL  Final     Radiology Studies: US Abdomen Limited RUQ (LIVER/GB)  Result Date: 02/20/2020 CLINICAL DATA:  Elevated LFTs EXAM: ULTRASOUND ABDOMEN LIMITED RIGHT UPPER QUADRANT COMPARISON:  07/30/2019 FINDINGS: Gallbladder: Surgically removed Common bile duct: Diameter: 2.9 mm. Liver: Persistent 8.3 cm mass is  noted in the left lobe of the liver similar to that seen on prior CT examinations consistent with focal nodular hyperplasia. No new focal mass is noted. Portal vein is patent on color Doppler imaging with normal  direction of blood flow towards the liver. Other: None. IMPRESSION: Status post cholecystectomy. No biliary ductal dilatation is seen. Persistent left hepatic mass stable from prior CT examination consistent with focal nodular hyperplasia. Electronically Signed   By: Inez Catalina M.D.   On: 02/20/2020 19:34    Scheduled Meds: . vitamin C  500 mg Oral Daily  . aspirin  81 mg Oral Daily  . docusate sodium  100 mg Oral Daily  . enoxaparin (LOVENOX) injection  50 mg Subcutaneous Q24H  . famotidine  20 mg Oral BID  . polyethylene glycol  17 g Oral Daily  . predniSONE  50 mg Oral Q breakfast  . prenatal vitamin w/FE, FA  1 tablet Oral Q1200  . zinc sulfate  220 mg Oral Daily   Continuous Infusions:  Time spent:34mn   LOS: 7 days   SMarylu Lund MD [facility] Pager On Amion  If 7PM-7AM, please contact night-coverage 02/21/2020, 5:04 PM

## 2020-02-21 NOTE — Progress Notes (Signed)
Initial Nutrition Assessment  DOCUMENTATION CODES:  Morbid obesity  INTERVENTION:  Regular Diet Pt may order double protein portions and snacks TID if she makes request when ordering meals   NUTRITION DIAGNOSIS:  Increased nutrient needs related to  (pregnancy and fetal growth requirements) as evidenced by  ([redacted] weeks GA twin pregancy).  GOAL:  Patient will meet greater than or equal to 90% of their needs MONITOR:  Weight trends  REASON FOR ASSESSMENT:  Antenatal   ASSESSMENT:  Admission day 7, 21 2/7 weeks twins, adm w/ COVID -19. in room air, appetite recorded as good. Pre-preg weight 108 Kg,no weight gain too date. pre-preg BMI 42.4 - morbid obesity   Diet Order:   Diet Order            Diet regular Room service appropriate? Yes; Fluid consistency: Thin  Diet effective now                EDUCATION NEEDS:  No education needs have been identified at this time  Skin:  Skin Assessment: Reviewed RN Assessment  Last BM:  1/31  Height:   Ht Readings from Last 1 Encounters:  02/12/20 5\' 3"  (1.6 m)   Weight:   Wt Readings from Last 1 Encounters:  02/12/20 106 kg   Ideal Body Weight:   115 lbs  BMI:  There is no height or weight on file to calculate BMI.  Estimated Nutritional Needs:   Kcal:  2500-2700  Protein:  110-120 g  Fluid:  2.7 L

## 2020-02-21 NOTE — Progress Notes (Signed)
Patient ID: Misty Murillo, female   DOB: [birth year], 30 y.o.   MRN: [id number] [place] COMPREHENSIVE PROGRESS NOTE  Misty Murillo is a 30 y.o. G3P1011 with dichorionic, diamniotic twin gestation at [redacted]w[redacted]d who is admitted for COVID pneumonia.  Estimated Date of Delivery: 07/01/20  Length of Stay:  7 Days. Admitted 02/14/2020  Subjective: Feeling much better today. Sitting up in bed. Currently on room air. Patient ambulates without shortness of breath. Patient reports fetal movements x 2 and denies uterine contractions, bleeding and loss of fluid per vagina.  Vitals:  Blood pressure 117/67, pulse 96, temperature 98.6 F (37 C), temperature source Oral, resp. rate 20, last menstrual period 09/25/2019, SpO2 99 %, unknown if currently breastfeeding. Physical Examination: CONSTITUTIONAL:No apparent distress. PSYCHIATRIC: Normal mood and affect. Normal behavior. Normal judgment and thought content. CARDIOVASCULAR: Elevated heart rate noted, regular rhythm RESPIRATORY: No observed distress while breathing, [place] in place MUSCULOSKELETAL: Normal range of motion. No edema and no tenderness. 2+ distal pulses. ABDOMEN: Soft, nontender, nondistended, gravid. CERVIX:  Deferred  Fetal monitoring: FHR: 155 bpm and 162 bpm Uterine activity: None reported  Results for orders placed or performed during the hospital encounter of 02/14/20 (from the past 48 hour(s))  Comprehensive metabolic panel     Status: Abnormal   Collection Time: 02/20/20  4:23 AM  Result Value Ref Range   Sodium 138 135 - 145 mmol/L   Potassium 2.8 (L) 3.5 - 5.1 mmol/L   Chloride 104 98 - 111 mmol/L   CO2 24 22 - 32 mmol/L   Glucose, Bld 98 70 - 99 mg/dL    Comment: Glucose reference range applies only to samples taken after fasting for at least 8 hours.   BUN <5 (L) 6 - 20 mg/dL   Creatinine, Ser 0.42 (L) 0.44 - 1.00 mg/dL   Calcium 8.3 (L) 8.9 - 10.3 mg/dL   Total Protein 5.6 (L) 6.5 - 8.1 g/dL   Albumin 2.1 (L)  3.5 - 5.0 g/dL   AST 169 (H) 15 - 41 U/L   ALT 182 (H) 0 - 44 U/L   Alkaline Phosphatase 86 38 - 126 U/L   Total Bilirubin 0.6 0.3 - 1.2 mg/dL   GFR, Estimated >60 >60 mL/min    Comment: (NOTE) Calculated using the CKD-EPI Creatinine Equation (2021)    Anion gap 10 5 - 15    Comment: Performed at [facility], [place] [address], [place], [place] [address]  Basic metabolic panel     Status: Abnormal   Collection Time: 02/20/20  2:20 PM  Result Value Ref Range   Sodium 136 135 - 145 mmol/L   Potassium 3.4 (L) 3.5 - 5.1 mmol/L   Chloride 102 98 - 111 mmol/L   CO2 24 22 - 32 mmol/L   Glucose, Bld 129 (H) 70 - 99 mg/dL    Comment: Glucose reference range applies only to samples taken after fasting for at least 8 hours.   BUN <5 (L) 6 - 20 mg/dL   Creatinine, Ser 0.56 0.44 - 1.00 mg/dL   Calcium 8.4 (L) 8.9 - 10.3 mg/dL   GFR, Estimated >60 >60 mL/min    Comment: (NOTE) Calculated using the CKD-EPI Creatinine Equation (2021)    Anion gap 10 5 - 15    Comment: Performed at [facility] [address] [place], [place] [address]  Potassium     Status: None   Collection Time: 02/20/20  8:32 PM  Result Value  Ref Range   Potassium 3.6 3.5 - 5.1 mmol/L    Comment: Performed at [facility] [address], [place], [place] [address]  Comprehensive metabolic panel     Status: Abnormal   Collection Time: 02/21/20  5:05 AM  Result Value Ref Range   Sodium 133 (L) 135 - 145 mmol/L   Potassium 2.9 (L) 3.5 - 5.1 mmol/L   Chloride 103 98 - 111 mmol/L   CO2 20 (L) 22 - 32 mmol/L   Glucose, Bld 142 (H) 70 - 99 mg/dL    Comment: Glucose reference range applies only to samples taken after fasting for at least 8 hours.   BUN <5 (L) 6 - 20 mg/dL   Creatinine, Ser 0.46 0.44 - 1.00 mg/dL   Calcium 8.2 (L) 8.9 - 10.3 mg/dL   Total Protein 5.5 (L) 6.5 - 8.1 g/dL   Albumin 2.1 (L) 3.5 - 5.0 g/dL   AST 145 (H) 15 - 41 U/L   ALT 204 (H) 0 - 44 U/L   Alkaline Phosphatase 84 38 - 126  U/L   Total Bilirubin 0.4 0.3 - 1.2 mg/dL   GFR, Estimated >60 >60 mL/min    Comment: (NOTE) Calculated using the CKD-EPI Creatinine Equation (2021)    Anion gap 10 5 - 15    Comment: Performed at [facility], [place] [address] [place], [place] [address]    US Abdomen Limited RUQ (LIVER/GB)  Result Date: 02/20/2020 CLINICAL DATA:  Elevated LFTs EXAM: ULTRASOUND ABDOMEN LIMITED RIGHT UPPER QUADRANT COMPARISON:  07/30/2019 FINDINGS: Gallbladder: Surgically removed Common bile duct: Diameter: 2.9 mm. Liver: Persistent 8.3 cm mass is noted in the left lobe of the liver similar to that seen on prior CT examinations consistent with focal nodular hyperplasia. No new focal mass is noted. Portal vein is patent on color Doppler imaging with normal direction of blood flow towards the liver. Other: None. IMPRESSION: Status post cholecystectomy. No biliary ductal dilatation is seen. Persistent left hepatic mass stable from prior CT examination consistent with focal nodular hyperplasia. Electronically Signed   By: Inez Catalina M.D.   On: 02/20/2020 19:34    Current scheduled medications . vitamin C  500 mg Oral Daily  . aspirin  81 mg Oral Daily  . docusate sodium  100 mg Oral Daily  . enoxaparin (LOVENOX) injection  50 mg Subcutaneous Q24H  . famotidine  20 mg Oral BID  . ondansetron (ZOFRAN) IV  4 mg Intravenous Q6H  . polyethylene glycol  17 g Oral Daily  . potassium chloride  60 mEq Oral BID  . predniSONE  50 mg Oral Q breakfast  . prenatal vitamin w/FE, FA  1 tablet Oral Q1200  . zinc sulfate  220 mg Oral Daily    I have reviewed the patient's current medications.  ASSESSMENT: Principal Problem:   Pneumonia due to COVID-19 virus Active Problems:   Dichorionic diamniotic twin gestation   Sepsis ([place])   Acute hypoxemic respiratory failure ([facility])   Intractable nausea and vomiting   Diarrhea   [redacted] weeks gestation of pregnancy  PLAN: Reassuring fetal status x 2, no acute obstetric  concerns. Daily dopplers for FHR checks. Continue routine antenatal care. Continue Dexamethasone, has completed Remdesivir Continue prophylactic Lovenox for VTE prophylaxis Appreciate TRH co-management and recommendations Discharge planning per TRH    Mora Bellman, MD, [place] for [employer], [place]

## 2020-02-22 ENCOUNTER — Other Ambulatory Visit: Payer: Self-pay | Admitting: Obstetrics and Gynecology

## 2020-02-22 DIAGNOSIS — E876 Hypokalemia: Secondary | ICD-10-CM

## 2020-02-22 DIAGNOSIS — O99282 Endocrine, nutritional and metabolic diseases complicating pregnancy, second trimester: Secondary | ICD-10-CM

## 2020-02-22 DIAGNOSIS — R945 Abnormal results of liver function studies: Secondary | ICD-10-CM

## 2020-02-22 DIAGNOSIS — Z3A21 21 weeks gestation of pregnancy: Secondary | ICD-10-CM

## 2020-02-22 MED ORDER — PREDNISONE 50 MG PO TABS: ORAL_TABLET | ORAL | 0 refills | Status: AC

## 2020-02-22 MED ORDER — PREDNISONE 50 MG PO TABS: ORAL_TABLET | ORAL | 0 refills | Status: DC

## 2020-02-22 MED ORDER — POTASSIUM CHLORIDE CRYS ER 20 MEQ PO TBCR: 20.0000 meq | EXTENDED_RELEASE_TABLET | Freq: Two times a day (BID) | ORAL | 0 refills | Status: DC

## 2020-02-22 NOTE — Discharge Instructions (Signed)
° °Infection Prevention Recommendations for Individuals Confirmed to have, °or Being Evaluated for, 2019 Novel Coronavirus (COVID-19) Infection Who °Receive Care at Home ° °Individuals who are confirmed to have, or are being evaluated for, COVID-19 should follow the prevention steps below °until a healthcare provider or local or state health department says they can return to normal activities. ° °Stay home except to get medical care °You should restrict activities outside your home, except for getting medical care. Do not go to work, school, or public °areas, and do not use public transportation or taxis. ° °Call ahead before visiting your doctor °Before your medical appointment, call the healthcare provider and tell them that you have, or are being evaluated for, °COVID-19 infection. This will help the healthcare provider’s office take steps to keep other people from getting infected. °Ask your healthcare provider to call the local or state health department. ° °Monitor your symptoms °Seek prompt medical attention if your illness is worsening (e.g., difficulty breathing). Before going to your medical °appointment, call the healthcare provider and tell them that you have, or are being evaluated for, COVID-19 infection. Ask °your healthcare provider to call the local or state health department. ° °Wear a facemask °You should wear a facemask that covers your nose and mouth when you are in the same room with other people and °when you visit a healthcare provider. People who live with or visit you should also wear a facemask while they are in the °same room with you. ° °Separate yourself from other people in your home °As much as possible, you should stay in a different room from other people in your home. Also, you should use a separate °bathroom, if available. ° °Avoid sharing household items °You should not share dishes, drinking glasses, cups, eating utensils, towels, bedding, or other items with other people  in °your home. After using these items, you should wash them thoroughly with soap and water. ° °Cover your coughs and sneezes °Cover your mouth and nose with a tissue when you cough or sneeze, or you can cough or sneeze into your sleeve. Throw °used tissues in a lined trash can, and immediately wash your hands with soap and water for at least 20 seconds or use an °alcohol-based hand rub. ° °Wash your hands °Wash your hands often and thoroughly with soap and water for at least 20 seconds. You can use an alcohol-based hand °sanitizer if soap and water are not available and if your hands are not visibly dirty. Avoid touching your eyes, nose, and °mouth with unwashed hands. ° ° °Prevention Steps for Caregivers and Household Members of °Individuals Confirmed to have, or Being Evaluated for, COVID-19 Infection Being Cared for in the Home ° °If you live with, or provide care at home for, a person confirmed to have, or being evaluated for, COVID-19 infection °please follow these guidelines to prevent infection: ° °Follow healthcare provider’s instructions °Make sure that you understand and can help the patient follow any healthcare provider instructions for all care. ° °Provide for the patient’s basic needs °You should help the patient with basic needs in the home and provide support for getting groceries, prescriptions, and °other personal needs. ° °Monitor the patient’s symptoms °If they are getting sicker, call his or her medical provider and tell them that the patient has, or is being evaluated for, °COVID-19 infection. This will help the healthcare provider’s office take steps to keep other people from getting infected. °Ask the healthcare provider to call the local or   state health department. ° °Limit the number of people who have contact with the patient °· If possible, have only one caregiver for the patient. °· Other household members should stay in another home or place of residence. If this is not possible, they  should stay °· in another room, or be separated from the patient as much as possible. Use a separate bathroom, if available. °· Restrict visitors who do not have an essential need to be in the home. ° °Keep older adults, very young children, and other sick people away from the patient °Keep older adults, very young children, and those who have compromised immune systems or chronic health conditions away from the patient. This includes people with chronic heart, lung, or kidney conditions, diabetes, and cancer. ° °Ensure good ventilation °Make sure that shared spaces in the home have good air flow, such as from an air conditioner or an opened window, °weather permitting. ° °Wash your hands often °· Wash your hands often and thoroughly with soap and water for at least 20 seconds. You can use an alcohol based hand sanitizer if soap and water are not available and if your hands are not visibly dirty. °· Avoid touching your eyes, nose, and mouth with unwashed hands. °· Use disposable paper towels to dry your hands. If not available, use dedicated cloth towels and replace them when they become wet. ° °Wear a facemask and gloves °· Wear a disposable facemask at all times in the room and gloves when you touch or have contact with the patient’s blood, body fluids, and/or secretions or excretions, such as sweat, saliva, sputum, nasal mucus, vomit, urine, or feces.  Ensure the mask fits over your nose and mouth tightly, and do not touch it during use. °· Throw out disposable facemasks and gloves after using them. Do not reuse. °· Wash your hands immediately after removing your facemask and gloves. °· If your personal clothing becomes contaminated, carefully remove clothing and launder. Wash your hands after handling contaminated clothing. °· Place all used disposable facemasks, gloves, and other waste in a lined container before disposing them with other household waste. °· Remove gloves and wash your hands immediately after  handling these items. ° °Do not share dishes, glasses, or other household items with the patient °· Avoid sharing household items. You should not share dishes, drinking glasses, cups, eating utensils, towels, bedding, or other items with a patient who is confirmed to have, or being evaluated for, COVID-19 infection. °· After the person uses these items, you should wash them thoroughly with soap and water. ° °Wash laundry thoroughly °· Immediately remove and wash clothes or bedding that have blood, body fluids, and/or secretions or excretions, such as sweat, saliva, sputum, nasal mucus, vomit, urine, or feces, on them. °· Wear gloves when handling laundry from the patient. °· Read and follow directions on labels of laundry or clothing items and detergent. In general, wash and dry with the warmest temperatures recommended on the label. ° °Clean all areas the individual has used often °· Clean all touchable surfaces, such as counters, tabletops, doorknobs, bathroom fixtures, toilets, phones, keyboards, tablets, and bedside tables, every day. Also, clean any surfaces that may have blood, body fluids, and/or secretions or excretions on them. °· Wear gloves when cleaning surfaces the patient has come in contact with. °· Use a diluted bleach solution (e.g., dilute bleach with 1 part bleach and 10 parts water) or a household disinfectant with a label that says EPA-registered for coronaviruses. To   make a bleach solution at home, add 1 tablespoon of bleach to 1 quart (4 cups) of water. For a larger supply, add ¼ cup of bleach to 1 gallon (16 cups) of water. °· Read labels of cleaning products and follow recommendations provided on product labels. Labels contain instructions for safe and effective use of the cleaning product including precautions you should take when applying the product, such as wearing gloves or eye protection and making sure you have good ventilation during use of the product. °· Remove gloves and wash  hands immediately after cleaning. ° °Monitor yourself for signs and symptoms of illness °Caregivers and household members are considered close contacts, should monitor their health, and will be asked to limit °movement outside of the home to the extent possible. Follow the monitoring steps for close contacts listed on the °symptom monitoring form. ° ° °? If you have additional questions, contact your local health department or call the epidemiologist on call at [phone] °(available 24/7). °? This guidance is subject to change. For the most up-to-date guidance from CDC, please refer to their website: °https://www.cdc.gov/coronavirus/2019-ncov/hcp/guidance-prevent-spread.html °

## 2020-02-22 NOTE — Progress Notes (Signed)
Discharge instructions given at 1010. All questions answered and pt verbalized understanding of medications, follow-up care, and activity. Pt alert and oriented. Pt ambulatory, however per activity instructions, wheeled out to car. Pt stated no pain at discharge.

## 2020-02-22 NOTE — Discharge Summary (Signed)
Antenatal Physician Discharge Summary  Patient ID: Misty Murillo MRN: [id number] DOB/AGE: 29-May-1990 29 y.o.  Admit date: 02/14/2020 Discharge date: 02/22/2020  Admission Diagnoses: Principal Problem:   Pneumonia due to COVID-19 virus Active Problems:   Dichorionic diamniotic twin gestation   Sepsis ([place])   Acute hypoxemic respiratory failure (HCC)   Intractable nausea and vomiting   Diarrhea   [redacted] weeks gestation of pregnancy   Hypokalemia   H/o Graves Disease   Elevated LFTs   Discharge Diagnoses:  Same  Prenatal Procedures: none  Consults: [facility]  Hospital Course:  Misty Murillo is a 30 y.o. G3P1011 with IUP at 55w3dadmitted for COVID-19 pneumonia with acute hypoxic respiratory failure.  The patient had a TRH consult and was started on remdesivir.  She completed a 5-day course.  She received 8 days of prednisone.  She was placed on high flow nasal cannula with an oxygen need as high as 11 L/min and this was eventually weaned down and off.  Also had significant hypokalemia and other electrolyte MMR abnormalities.  She had significant nausea and vomiting which were corrected with antiemetics.  Her electrolyte abnormalities were also corrected.  After 8 days of hospitalization she was felt stable for discharge.  Discharge Exam: Temp:  [97.7 F (36.5 C)-98.3 F (36.8 C)] 98.3 F (36.8 C) (02/02 0805) Pulse Rate:  [92-101] 96 (02/02 0805) Resp:  [16-17] 17 (02/02 0805) BP: (111-124)/(59-74) 114/65 (02/02 0805) SpO2:  [92 %-100 %] 96 % (02/02 0805) Physical Examination: CONSTITUTIONAL: Well-developed, well-nourished female in no acute distress.  HENT:  Normocephalic, atraumatic, External right and left ear normal. Oropharynx is clear and moist EYES: Conjunctivae and EOM are normal. Pupils are equal, round, and reactive to light. No scleral icterus.  NECK: Normal range of motion, supple, no masses SKIN: Skin is warm and dry. No rash noted. Not diaphoretic. No erythema. No  pallor. N[place] Alert and oriented to person, place, and time. Normal reflexes, muscle tone coordination. No cranial nerve deficit noted. PSYCHIATRIC: Normal mood and affect. Normal behavior. Normal judgment and thought content. CARDIOVASCULAR: Normal heart rate noted, regular rhythm RESPIRATORY: Effort and breath sounds normal, no problems with respiration noted MUSCULOSKELETAL: Normal range of motion. No edema and no tenderness. 2+ distal pulses. ABDOMEN: Soft, nontender, nondistended, gravid.   Significant Diagnostic Studies:  Results for orders placed or performed during the hospital encounter of 02/14/20 (from the past 168 hour(s))  Blood gas, arterial   Collection Time: 02/16/20  2:41 AM  Result Value Ref Range   FIO2 100.00    Mode NASAL CANNULA    pH, Arterial 7.442 7.350 - 7.450   pCO2 arterial 32.9 32.0 - 48.0 mmHg   pO2, Arterial 79.8 (L) 83.0 - 108.0 mmHg   Bicarbonate 22.1 20.0 - 28.0 mmol/L   Acid-base deficit 1.0 0.0 - 2.0 mmol/L   O2 Saturation 96.2 %   Sample type ARTERIAL   CBC with Differential/Platelet   Collection Time: 02/16/20  3:50 AM  Result Value Ref Range   WBC 5.9 4.0 - 10.5 K/uL   RBC 2.94 (L) 3.87 - 5.11 MIL/uL   Hemoglobin 8.8 (L) 12.0 - 15.0 g/dL   HCT 24.5 (L) 36.0 - 46.0 %   MCV 83.3 80.0 - 100.0 fL   MCH 29.9 26.0 - 34.0 pg   MCHC 35.9 30.0 - 36.0 g/dL   RDW 13.8 11.5 - 15.5 %   Platelets 214 150 - 400 K/uL   nRBC 0.3 (H) 0.0 - 0.2 %  Neutrophils Relative % 80 %   Neutro Abs 4.7 1.7 - 7.7 K/uL   Lymphocytes Relative 9 %   Lymphs Abs 0.5 (L) 0.7 - 4.0 K/uL   Monocytes Relative 6 %   Monocytes Absolute 0.4 0.1 - 1.0 K/uL   Eosinophils Relative 0 %   Eosinophils Absolute 0.0 0.0 - 0.5 K/uL   Basophils Relative 0 %   Basophils Absolute 0.0 0.0 - 0.1 K/uL   Immature Granulocytes 5 %   Abs Immature Granulocytes 0.31 (H) 0.00 - 0.07 K/uL  Comprehensive metabolic panel   Collection Time: 02/16/20  3:50 AM  Result Value Ref Range    Sodium 136 135 - 145 mmol/L   Potassium 2.9 (L) 3.5 - 5.1 mmol/L   Chloride 104 98 - 111 mmol/L   CO2 20 (L) 22 - 32 mmol/L   Glucose, Bld 121 (H) 70 - 99 mg/dL   BUN <5 (L) 6 - 20 mg/dL   Creatinine, Ser 0.49 0.44 - 1.00 mg/dL   Calcium 8.2 (L) 8.9 - 10.3 mg/dL   Total Protein 5.4 (L) 6.5 - 8.1 g/dL   Albumin 1.9 (L) 3.5 - 5.0 g/dL   AST 48 (H) 15 - 41 U/L   ALT 28 0 - 44 U/L   Alkaline Phosphatase 90 38 - 126 U/L   Total Bilirubin 0.8 0.3 - 1.2 mg/dL   GFR, Estimated >60 >60 mL/min   Anion gap 12 5 - 15  C-reactive protein   Collection Time: 02/16/20  3:50 AM  Result Value Ref Range   CRP 7.8 (H) <1.0 mg/dL  D-dimer, quantitative (not at [facility])   Collection Time: 02/16/20  3:50 AM  Result Value Ref Range   D-Dimer, Quant 1.19 (H) 0.00 - 0.50 ug/mL-FEU  CBC with Differential/Platelet   Collection Time: 02/17/20  7:33 AM  Result Value Ref Range   WBC 6.6 4.0 - 10.5 K/uL   RBC 3.20 (L) 3.87 - 5.11 MIL/uL   Hemoglobin 9.5 (L) 12.0 - 15.0 g/dL   HCT 26.8 (L) 36.0 - 46.0 %   MCV 83.8 80.0 - 100.0 fL   MCH 29.7 26.0 - 34.0 pg   MCHC 35.4 30.0 - 36.0 g/dL   RDW 13.9 11.5 - 15.5 %   Platelets 296 150 - 400 K/uL   nRBC 0.5 (H) 0.0 - 0.2 %   Neutrophils Relative % 74 %   Neutro Abs 4.9 1.7 - 7.7 K/uL   Lymphocytes Relative 11 %   Lymphs Abs 0.7 0.7 - 4.0 K/uL   Monocytes Relative 9 %   Monocytes Absolute 0.6 0.1 - 1.0 K/uL   Eosinophils Relative 0 %   Eosinophils Absolute 0.0 0.0 - 0.5 K/uL   Basophils Relative 0 %   Basophils Absolute 0.0 0.0 - 0.1 K/uL   Immature Granulocytes 6 %   Abs Immature Granulocytes 0.38 (H) 0.00 - 0.07 K/uL  Comprehensive metabolic panel   Collection Time: 02/17/20  7:33 AM  Result Value Ref Range   Sodium 136 135 - 145 mmol/L   Potassium 2.9 (L) 3.5 - 5.1 mmol/L   Chloride 103 98 - 111 mmol/L   CO2 20 (L) 22 - 32 mmol/L   Glucose, Bld 125 (H) 70 - 99 mg/dL   BUN <5 (L) 6 - 20 mg/dL   Creatinine, Ser 0.52 0.44 - 1.00 mg/dL   Calcium 8.5 (L)  8.9 - 10.3 mg/dL   Total Protein 5.9 (L) 6.5 - 8.1 g/dL   Albumin 2.1 (L)  3.5 - 5.0 g/dL   AST 55 (H) 15 - 41 U/L   ALT 31 0 - 44 U/L   Alkaline Phosphatase 103 38 - 126 U/L   Total Bilirubin 1.7 (H) 0.3 - 1.2 mg/dL   GFR, Estimated >60 >60 mL/min   Anion gap 13 5 - 15  C-reactive protein   Collection Time: 02/17/20  7:33 AM  Result Value Ref Range   CRP 4.3 (H) <1.0 mg/dL  D-dimer, quantitative (not at [facility])   Collection Time: 02/17/20  7:33 AM  Result Value Ref Range   D-Dimer, Quant 1.12 (H) 0.00 - 0.50 ug/mL-FEU  Basic metabolic panel   Collection Time: 02/17/20  3:58 PM  Result Value Ref Range   Sodium 136 135 - 145 mmol/L   Potassium 2.9 (L) 3.5 - 5.1 mmol/L   Chloride 104 98 - 111 mmol/L   CO2 20 (L) 22 - 32 mmol/L   Glucose, Bld 146 (H) 70 - 99 mg/dL   BUN <5 (L) 6 - 20 mg/dL   Creatinine, Ser 0.52 0.44 - 1.00 mg/dL   Calcium 8.3 (L) 8.9 - 10.3 mg/dL   GFR, Estimated >60 >60 mL/min   Anion gap 12 5 - 15  CBC with Differential/Platelet   Collection Time: 02/18/20  5:59 AM  Result Value Ref Range   WBC 8.3 4.0 - 10.5 K/uL   RBC 3.01 (L) 3.87 - 5.11 MIL/uL   Hemoglobin 8.9 (L) 12.0 - 15.0 g/dL   HCT 25.3 (L) 36.0 - 46.0 %   MCV 84.1 80.0 - 100.0 fL   MCH 29.6 26.0 - 34.0 pg   MCHC 35.2 30.0 - 36.0 g/dL   RDW 13.8 11.5 - 15.5 %   Platelets 323 150 - 400 K/uL   nRBC 0.5 (H) 0.0 - 0.2 %   Neutrophils Relative % 89 %   Neutro Abs 7.4 1.7 - 7.7 K/uL   Lymphocytes Relative 5 %   Lymphs Abs 0.4 (L) 0.7 - 4.0 K/uL   Monocytes Relative 6 %   Monocytes Absolute 0.5 0.1 - 1.0 K/uL   Eosinophils Relative 0 %   Eosinophils Absolute 0.0 0.0 - 0.5 K/uL   Basophils Relative 0 %   Basophils Absolute 0.0 0.0 - 0.1 K/uL   WBC Morphology See Note    nRBC 3 (H) 0 /100 WBC   Abs Immature Granulocytes 0.00 0.00 - 0.07 K/uL   Polychromasia PRESENT   Comprehensive metabolic panel   Collection Time: 02/18/20  5:59 AM  Result Value Ref Range   Sodium 138 135 - 145 mmol/L    Potassium 3.1 (L) 3.5 - 5.1 mmol/L   Chloride 105 98 - 111 mmol/L   CO2 22 22 - 32 mmol/L   Glucose, Bld 135 (H) 70 - 99 mg/dL   BUN <5 (L) 6 - 20 mg/dL   Creatinine, Ser 0.50 0.44 - 1.00 mg/dL   Calcium 8.0 (L) 8.9 - 10.3 mg/dL   Total Protein 5.5 (L) 6.5 - 8.1 g/dL   Albumin 2.1 (L) 3.5 - 5.0 g/dL   AST 113 (H) 15 - 41 U/L   ALT 61 (H) 0 - 44 U/L   Alkaline Phosphatase 100 38 - 126 U/L   Total Bilirubin 1.1 0.3 - 1.2 mg/dL   GFR, Estimated >60 >60 mL/min   Anion gap 11 5 - 15  C-reactive protein   Collection Time: 02/18/20  5:59 AM  Result Value Ref Range   CRP 1.4 (H) <1.0 mg/dL  D-dimer, quantitative (not at [facility])   Collection Time: 02/18/20  5:59 AM  Result Value Ref Range   D-Dimer, Quant 0.86 (H) 0.00 - 0.50 ug/mL-FEU  Basic metabolic panel   Collection Time: 02/18/20  3:49 PM  Result Value Ref Range   Sodium 136 135 - 145 mmol/L   Potassium 3.0 (L) 3.5 - 5.1 mmol/L   Chloride 102 98 - 111 mmol/L   CO2 21 (L) 22 - 32 mmol/L   Glucose, Bld 165 (H) 70 - 99 mg/dL   BUN <5 (L) 6 - 20 mg/dL   Creatinine, Ser 0.52 0.44 - 1.00 mg/dL   Calcium 8.0 (L) 8.9 - 10.3 mg/dL   GFR, Estimated >60 >60 mL/min   Anion gap 13 5 - 15  Magnesium   Collection Time: 02/18/20  3:49 PM  Result Value Ref Range   Magnesium 2.1 1.7 - 2.4 mg/dL  CBC with Differential/Platelet   Collection Time: 02/19/20  5:28 AM  Result Value Ref Range   WBC 11.7 (H) 4.0 - 10.5 K/uL   RBC 3.06 (L) 3.87 - 5.11 MIL/uL   Hemoglobin 8.8 (L) 12.0 - 15.0 g/dL   HCT 26.4 (L) 36.0 - 46.0 %   MCV 86.3 80.0 - 100.0 fL   MCH 28.8 26.0 - 34.0 pg   MCHC 33.3 30.0 - 36.0 g/dL   RDW 14.0 11.5 - 15.5 %   Platelets 364 150 - 400 K/uL   nRBC 0.3 (H) 0.0 - 0.2 %   Neutrophils Relative % 94 %   Neutro Abs 11.0 (H) 1.7 - 7.7 K/uL   Lymphocytes Relative 2 %   Lymphs Abs 0.2 (L) 0.7 - 4.0 K/uL   Monocytes Relative 4 %   Monocytes Absolute 0.5 0.1 - 1.0 K/uL   Eosinophils Relative 0 %   Eosinophils Absolute 0.0 0.0 -  0.5 K/uL   Basophils Relative 0 %   Basophils Absolute 0.0 0.0 - 0.1 K/uL   WBC Morphology See Note    nRBC 0 0 /100 WBC   Abs Immature Granulocytes 0.00 0.00 - 0.07 K/uL  Comprehensive metabolic panel   Collection Time: 02/19/20  5:28 AM  Result Value Ref Range   Sodium 137 135 - 145 mmol/L   Potassium 3.5 3.5 - 5.1 mmol/L   Chloride 105 98 - 111 mmol/L   CO2 21 (L) 22 - 32 mmol/L   Glucose, Bld 159 (H) 70 - 99 mg/dL   BUN <5 (L) 6 - 20 mg/dL   Creatinine, Ser 0.49 0.44 - 1.00 mg/dL   Calcium 8.1 (L) 8.9 - 10.3 mg/dL   Total Protein 5.7 (L) 6.5 - 8.1 g/dL   Albumin 2.2 (L) 3.5 - 5.0 g/dL   AST 153 (H) 15 - 41 U/L   ALT 130 (H) 0 - 44 U/L   Alkaline Phosphatase 89 38 - 126 U/L   Total Bilirubin 0.8 0.3 - 1.2 mg/dL   GFR, Estimated >60 >60 mL/min   Anion gap 11 5 - 15  C-reactive protein   Collection Time: 02/19/20  5:28 AM  Result Value Ref Range   CRP 0.7 <1.0 mg/dL  D-dimer, quantitative (not at [facility])   Collection Time: 02/19/20  5:28 AM  Result Value Ref Range   D-Dimer, Quant 0.86 (H) 0.00 - 0.50 ug/mL-FEU  Type and screen [place]   Collection Time: 02/19/20  7:25 AM  Result Value Ref Range   ABO/RH(D) O POS    Antibody Screen NEG  Sample Expiration      02/22/2020,2359 Performed at [facility], [place] [address] [place], [place] [address]   Comprehensive metabolic panel   Collection Time: 02/20/20  4:23 AM  Result Value Ref Range   Sodium 138 135 - 145 mmol/L   Potassium 2.8 (L) 3.5 - 5.1 mmol/L   Chloride 104 98 - 111 mmol/L   CO2 24 22 - 32 mmol/L   Glucose, Bld 98 70 - 99 mg/dL   BUN <5 (L) 6 - 20 mg/dL   Creatinine, Ser 0.42 (L) 0.44 - 1.00 mg/dL   Calcium 8.3 (L) 8.9 - 10.3 mg/dL   Total Protein 5.6 (L) 6.5 - 8.1 g/dL   Albumin 2.1 (L) 3.5 - 5.0 g/dL   AST 169 (H) 15 - 41 U/L   ALT 182 (H) 0 - 44 U/L   Alkaline Phosphatase 86 38 - 126 U/L   Total Bilirubin 0.6 0.3 - 1.2 mg/dL   GFR, Estimated >60 >60 mL/min   Anion gap  10 5 - 15  Basic metabolic panel   Collection Time: 02/20/20  2:20 PM  Result Value Ref Range   Sodium 136 135 - 145 mmol/L   Potassium 3.4 (L) 3.5 - 5.1 mmol/L   Chloride 102 98 - 111 mmol/L   CO2 24 22 - 32 mmol/L   Glucose, Bld 129 (H) 70 - 99 mg/dL   BUN <5 (L) 6 - 20 mg/dL   Creatinine, Ser 0.56 0.44 - 1.00 mg/dL   Calcium 8.4 (L) 8.9 - 10.3 mg/dL   GFR, Estimated >60 >60 mL/min   Anion gap 10 5 - 15  Potassium   Collection Time: 02/20/20  8:32 PM  Result Value Ref Range   Potassium 3.6 3.5 - 5.1 mmol/L  Comprehensive metabolic panel   Collection Time: 02/21/20  5:05 AM  Result Value Ref Range   Sodium 133 (L) 135 - 145 mmol/L   Potassium 2.9 (L) 3.5 - 5.1 mmol/L   Chloride 103 98 - 111 mmol/L   CO2 20 (L) 22 - 32 mmol/L   Glucose, Bld 142 (H) 70 - 99 mg/dL   BUN <5 (L) 6 - 20 mg/dL   Creatinine, Ser 0.46 0.44 - 1.00 mg/dL   Calcium 8.2 (L) 8.9 - 10.3 mg/dL   Total Protein 5.5 (L) 6.5 - 8.1 g/dL   Albumin 2.1 (L) 3.5 - 5.0 g/dL   AST 145 (H) 15 - 41 U/L   ALT 204 (H) 0 - 44 U/L   Alkaline Phosphatase 84 38 - 126 U/L   Total Bilirubin 0.4 0.3 - 1.2 mg/dL   GFR, Estimated >60 >60 mL/min   Anion gap 10 5 - 15  Potassium   Collection Time: 02/21/20  3:46 PM  Result Value Ref Range   Potassium 3.4 (L) 3.5 - 5.1 mmol/L   CT ANGIO CHEST PE W OR WO CONTRAST  Result Date: 02/15/2020 CLINICAL DATA:  Shortness of breath, cough, COVID positive EXAM: CT ANGIOGRAPHY CHEST WITH CONTRAST TECHNIQUE: Multidetector CT imaging of the chest was performed using the standard protocol during bolus administration of intravenous contrast. Multiplanar CT image reconstructions and MIPs were obtained to evaluate the vascular anatomy. CONTRAST:  72m OMNIPAQUE IOHEXOL 350 MG/ML SOLN COMPARISON:  01/28/2018 FINDINGS: Cardiovascular: Heart size normal. No pericardial effusion. Fair contrast opacification of pulmonary artery branches. No convincing filling defects to diagnose acute PE. Adequate  contrast opacification of the thoracic aorta with no evidence of dissection, aneurysm, or stenosis. There  is classic 3-vessel brachiocephalic arch anatomy without proximal stenosis. Mediastinum/Nodes: Right hilar and subcarinal adenopathy, new since previous, presumably reactive. Central airways unremarkable. Lungs/Pleura: No pleural effusion. No pneumothorax. New scattered somewhat nodular bilateral airspace opacities with a predominantly perihilar distribution, involving bases more than upper lobes. Upper Abdomen: No acute abnormality. Musculoskeletal: No chest wall abnormality. No acute or significant osseous findings. Review of the MIP images confirms the above findings. IMPRESSION: 1. Negative for acute PE or thoracic aortic dissection. 2. New scattered somewhat nodular bilateral airspace opacities with a predominantly perihilar distribution, involving bases more than upper lobes. Favor infectious/inflammatory etiology given rapid progression. 3. Right hilar and subcarinal adenopathy, presumably reactive. Electronically Signed   By: Lucrezia Europe M.D.   On: 02/15/2020 13:48   DG Chest Portable 1 View  Result Date: 02/12/2020 CLINICAL DATA:  History of COVID-19.  Headache.  Chills. EXAM: PORTABLE CHEST 1 VIEW COMPARISON:  Chest radiograph 02/10/2020. FINDINGS: Stable cardiac and mediastinal contours. Interval development patchy consolidation right mid lung. No pleural effusion or pneumothorax. Thoracic spine degenerative changes. IMPRESSION: New focal consolidation right mid lung may represent atelectasis or potentially pneumonia. Recommend further evaluation with PA and lateral chest radiograph. Electronically Signed   By: Lovey Newcomer M.D.   On: 02/12/2020 10:06   DG Chest Port 1 View  Result Date: 02/10/2020 CLINICAL DATA:  Suspected COVID EXAM: PORTABLE CHEST 1 VIEW COMPARISON:  06/01/2019 FINDINGS: Normal heart size and mediastinal contours. Low volume chest. No acute infiltrate or edema. No effusion or  pneumothorax. No acute osseous findings. IMPRESSION: Negative low volume chest. Electronically Signed   By: Monte Fantasia M.D.   On: 02/10/2020 05:45   VAS [place] LOWER EXTREMITY VENOUS (DVT)  Result Date: 02/15/2020  Lower Venous DVT Study Indications: Covid,elevated ddimer.  Limitations: Pain tolerance. Comparison Study: no prior Performing Technologist: Abram Sander RVS  Examination Guidelines: A complete evaluation includes B-mode imaging, spectral Doppler, color Doppler, and power Doppler as needed of all accessible portions of each vessel. Bilateral testing is considered an integral part of a complete examination. Limited examinations for reoccurring indications may be performed as noted. The reflux portion of the exam is performed with the patient in reverse Trendelenburg.  +---------+---------------+---------+-----------+----------+-------------------+ RIGHT    CompressibilityPhasicitySpontaneityPropertiesThrombus Aging      +---------+---------------+---------+-----------+----------+-------------------+ CFV      Full           Yes      Yes                                      +---------+---------------+---------+-----------+----------+-------------------+ SFJ      Full                                                             +---------+---------------+---------+-----------+----------+-------------------+ FV Prox  Full                                                             +---------+---------------+---------+-----------+----------+-------------------+ FV Mid  Yes      Yes                                      +---------+---------------+---------+-----------+----------+-------------------+ FV Distal               Yes      Yes                                      +---------+---------------+---------+-----------+----------+-------------------+ PFV      Full                                                              +---------+---------------+---------+-----------+----------+-------------------+ POP      Partial        Yes      Yes                                      +---------+---------------+---------+-----------+----------+-------------------+ PTV      Full                                                             +---------+---------------+---------+-----------+----------+-------------------+ PERO                                                  Not well visualized +---------+---------------+---------+-----------+----------+-------------------+   +---------+---------------+---------+-----------+----------+-------------------+ LEFT     CompressibilityPhasicitySpontaneityPropertiesThrombus Aging      +---------+---------------+---------+-----------+----------+-------------------+ CFV      Full           Yes      Yes                                      +---------+---------------+---------+-----------+----------+-------------------+ SFJ      Full                                                             +---------+---------------+---------+-----------+----------+-------------------+ FV Prox  Full                                                             +---------+---------------+---------+-----------+----------+-------------------+ FV Mid                  Yes      Yes                                      +---------+---------------+---------+-----------+----------+-------------------+  FV Distal               Yes      Yes                                      +---------+---------------+---------+-----------+----------+-------------------+ PFV      Full                                                             +---------+---------------+---------+-----------+----------+-------------------+ POP      Full           Yes      Yes                                      +---------+---------------+---------+-----------+----------+-------------------+  PTV      Full                                                             +---------+---------------+---------+-----------+----------+-------------------+ PERO                                                  Not well visualized +---------+---------------+---------+-----------+----------+-------------------+     Summary: BILATERAL: - No evidence of deep vein thrombosis seen in the lower extremities, bilaterally. - No evidence of superficial venous thrombosis in the lower extremities, bilaterally. -No evidence of popliteal cyst, bilaterally.   *See table(s) above for measurements and observations. Electronically signed by Harold Barban MD on 02/15/2020 at 9:40:50 PM.    Final    US Abdomen Limited RUQ (LIVER/GB)  Result Date: 02/20/2020 CLINICAL DATA:  Elevated LFTs EXAM: ULTRASOUND ABDOMEN LIMITED RIGHT UPPER QUADRANT COMPARISON:  07/30/2019 FINDINGS: Gallbladder: Surgically removed Common bile duct: Diameter: 2.9 mm. Liver: Persistent 8.3 cm mass is noted in the left lobe of the liver similar to that seen on prior CT examinations consistent with focal nodular hyperplasia. No new focal mass is noted. Portal vein is patent on color Doppler imaging with normal direction of blood flow towards the liver. Other: None. IMPRESSION: Status post cholecystectomy. No biliary ductal dilatation is seen. Persistent left hepatic mass stable from prior CT examination consistent with focal nodular hyperplasia. Electronically Signed   By: Inez Catalina M.D.   On: 02/20/2020 19:34    Future Appointments  Date Time Provider [place]  02/24/2020 12:30 PM [facility] NURSE [facility]  02/24/2020 12:45 PM [facility] US[facility]  02/27/2020  3:30 PM Florian Buff, MD [facility]  03/12/2020 10:00 AM [facility] [place] [facility] None  03/12/2020 11:10 AM Roma Schanz, CNM [facility]  03/29/2020  8:30 AM [facility] LAB [facility] None  04/03/2020  4:15 PM Elayne Snare, MD [facility] None     Discharge Condition: Stable  Discharge disposition: 01-Home or Self Care       Discharge  Instructions    Discharge activity:  Bathroom / Shower only   Complete by: As directed    Discharge activity: Bedrest   Complete by: As directed    Discharge diet:  No restrictions   Complete by: As directed    Fetal Kick Count:  Lie on our left side for one hour after a meal, and count the number of times your baby kicks.  If it is less than 5 times, get up, move around and drink some juice.  Repeat the test 30 minutes later.  If it is still less than 5 kicks in an hour, notify your doctor.   Complete by: As directed    No sexual activity restrictions   Complete by: As directed    Notify physician for a general feeling that "something is not right"   Complete by: As directed    Notify physician for increase or change in vaginal discharge   Complete by: As directed    Notify physician for intestinal cramps, with or without diarrhea, sometimes described as "gas pain"   Complete by: As directed    Notify physician for leaking of fluid   Complete by: As directed    Notify physician for low, dull backache, unrelieved by heat or Tylenol   Complete by: As directed    Notify physician for menstrual like cramps   Complete by: As directed    Notify physician for pelvic pressure   Complete by: As directed    Notify physician for uterine contractions.  These may be painless and feel like the uterus is tightening or the baby is  "balling up"   Complete by: As directed    Notify physician for vaginal bleeding   Complete by: As directed    PRETERM LABOR:  Includes any of the follwing symptoms that occur between 20 - [redacted] weeks gestation.  If these symptoms are not stopped, preterm labor can result in preterm delivery, placing your baby at risk   Complete by: As directed      Allergies as of 02/22/2020   No Known Allergies     Medication List    TAKE these medications   aspirin EC 81 MG  tablet Take 1 tablet (81 mg total) by mouth daily. Take after 12 weeks for prevention of preeclampsia later in pregnancy   Blood Pressure Monitoring Devi 1 each by Does not apply route once a week.   Comfort Fit Maternity Supp Lg Misc 1 application by Does not apply route daily.   famotidine 20 MG tablet Commonly known as: Pepcid Take 1 tablet (20 mg total) by mouth 2 (two) times daily.   ondansetron 4 MG tablet Commonly known as: ZOFRAN Take 1 tablet (4 mg total) by mouth every 6 (six) hours.   potassium chloride SA 20 MEQ tablet Commonly known as: KLOR-CON Take 1 tablet (20 mEq total) by mouth 2 (two) times daily.   predniSONE 50 MG tablet Commonly known as: DELTASONE 1 tab daily   prenatal multivitamin Tabs tablet Take 1 tablet by mouth daily at 12 noon.       Follow-up Information    [facility] Follow up in 2 week(s).   Specialty: Obstetrics and Gynecology Contact information: [place] [phone]              Total discharge time: 30 minutes   Signed: Donnamae Jude M.D. 02/22/2020, 9:09 AM

## 2020-02-22 NOTE — Plan of Care (Signed)
  Problem: Education: Goal: Knowledge of disease or condition will improve Outcome: Adequate for Discharge Goal: Knowledge of the prescribed therapeutic regimen will improve Outcome: Adequate for Discharge Goal: Individualized Educational Video(s) Outcome: Adequate for Discharge   Problem: Clinical Measurements: Goal: Complications related to the disease process, condition or treatment will be avoided or minimized Outcome: Adequate for Discharge   Problem: Education: Goal: Knowledge of risk factors and measures for prevention of condition will improve Outcome: Adequate for Discharge   Problem: Coping: Goal: Psychosocial and spiritual needs will be supported Outcome: Adequate for Discharge   Problem: Respiratory: Goal: Will maintain a patent airway Outcome: Adequate for Discharge Goal: Complications related to the disease process, condition or treatment will be avoided or minimized Outcome: Adequate for Discharge   Problem: Education: Goal: Knowledge of disease or condition will improve Outcome: Adequate for Discharge Goal: Individualized Educational Video(s) Outcome: Adequate for Discharge   Problem: Clinical Measurements: Goal: Complications related to the disease process, condition or treatment will be avoided or minimized Outcome: Adequate for Discharge   Problem: Education: Goal: Knowledge of General Education information will improve Description: Including pain rating scale, medication(s)/side effects and non-pharmacologic comfort measures Outcome: Adequate for Discharge   Problem: Health Behavior/Discharge Planning: Goal: Ability to manage health-related needs will improve Outcome: Adequate for Discharge   Problem: Clinical Measurements: Goal: Ability to maintain clinical measurements within normal limits will improve Outcome: Adequate for Discharge Goal: Will remain free from infection Outcome: Adequate for Discharge Goal: Diagnostic test results will  improve Outcome: Adequate for Discharge Goal: Respiratory complications will improve Outcome: Adequate for Discharge Goal: Cardiovascular complication will be avoided Outcome: Adequate for Discharge   Problem: Activity: Goal: Risk for activity intolerance will decrease Outcome: Adequate for Discharge   Problem: Nutrition: Goal: Adequate nutrition will be maintained Outcome: Adequate for Discharge   Problem: Coping: Goal: Level of anxiety will decrease Outcome: Adequate for Discharge   Problem: Elimination: Goal: Will not experience complications related to bowel motility Outcome: Adequate for Discharge Goal: Will not experience complications related to urinary retention Outcome: Adequate for Discharge   Problem: Pain Managment: Goal: General experience of comfort will improve Outcome: Adequate for Discharge   Problem: Safety: Goal: Ability to remain free from injury will improve Outcome: Adequate for Discharge   Problem: Skin Integrity: Goal: Risk for impaired skin integrity will decrease Outcome: Adequate for Discharge

## 2020-02-24 ENCOUNTER — Other Ambulatory Visit: Payer: Self-pay

## 2020-02-24 ENCOUNTER — Encounter: Payer: Self-pay | Admitting: *Deleted

## 2020-02-24 ENCOUNTER — Ambulatory Visit: Payer: Medicaid Other | Attending: Obstetrics and Gynecology

## 2020-02-24 ENCOUNTER — Ambulatory Visit: Payer: Medicaid Other | Admitting: *Deleted

## 2020-02-24 DIAGNOSIS — E669 Obesity, unspecified: Secondary | ICD-10-CM

## 2020-02-24 DIAGNOSIS — O99212 Obesity complicating pregnancy, second trimester: Secondary | ICD-10-CM | POA: Diagnosis not present

## 2020-02-24 DIAGNOSIS — Z3A21 21 weeks gestation of pregnancy: Secondary | ICD-10-CM | POA: Diagnosis present

## 2020-02-24 DIAGNOSIS — D219 Benign neoplasm of connective and other soft tissue, unspecified: Secondary | ICD-10-CM

## 2020-02-24 DIAGNOSIS — O0990 Supervision of high risk pregnancy, unspecified, unspecified trimester: Secondary | ICD-10-CM

## 2020-02-24 DIAGNOSIS — E0500 Thyrotoxicosis with diffuse goiter without thyrotoxic crisis or storm: Secondary | ICD-10-CM | POA: Diagnosis not present

## 2020-02-24 DIAGNOSIS — Z363 Encounter for antenatal screening for malformations: Secondary | ICD-10-CM | POA: Diagnosis not present

## 2020-02-24 DIAGNOSIS — O99210 Obesity complicating pregnancy, unspecified trimester: Secondary | ICD-10-CM

## 2020-02-24 DIAGNOSIS — Z8619 Personal history of other infectious and parasitic diseases: Secondary | ICD-10-CM

## 2020-02-24 DIAGNOSIS — O30009 Twin pregnancy, unspecified number of placenta and unspecified number of amniotic sacs, unspecified trimester: Secondary | ICD-10-CM | POA: Diagnosis present

## 2020-02-24 DIAGNOSIS — D259 Leiomyoma of uterus, unspecified: Secondary | ICD-10-CM

## 2020-02-24 DIAGNOSIS — O99282 Endocrine, nutritional and metabolic diseases complicating pregnancy, second trimester: Secondary | ICD-10-CM | POA: Diagnosis not present

## 2020-02-24 DIAGNOSIS — O30002 Twin pregnancy, unspecified number of placenta and unspecified number of amniotic sacs, second trimester: Secondary | ICD-10-CM

## 2020-02-24 DIAGNOSIS — O322XX0 Maternal care for transverse and oblique lie, not applicable or unspecified: Secondary | ICD-10-CM

## 2020-02-24 DIAGNOSIS — E0590 Thyrotoxicosis, unspecified without thyrotoxic crisis or storm: Secondary | ICD-10-CM

## 2020-02-24 DIAGNOSIS — O3412 Maternal care for benign tumor of corpus uteri, second trimester: Secondary | ICD-10-CM

## 2020-02-24 DIAGNOSIS — E876 Hypokalemia: Secondary | ICD-10-CM | POA: Insufficient documentation

## 2020-02-24 DIAGNOSIS — N730 Acute parametritis and pelvic cellulitis: Secondary | ICD-10-CM | POA: Diagnosis not present

## 2020-02-24 DIAGNOSIS — O26612 Liver and biliary tract disorders in pregnancy, second trimester: Secondary | ICD-10-CM

## 2020-02-24 DIAGNOSIS — O23592 Infection of other part of genital tract in pregnancy, second trimester: Secondary | ICD-10-CM

## 2020-02-24 DIAGNOSIS — N739 Female pelvic inflammatory disease, unspecified: Secondary | ICD-10-CM

## 2020-02-24 DIAGNOSIS — R160 Hepatomegaly, not elsewhere classified: Secondary | ICD-10-CM

## 2020-02-27 ENCOUNTER — Encounter: Payer: Medicaid Other | Admitting: Obstetrics & Gynecology

## 2020-02-27 ENCOUNTER — Other Ambulatory Visit: Payer: Self-pay | Admitting: *Deleted

## 2020-02-27 DIAGNOSIS — O30042 Twin pregnancy, dichorionic/diamniotic, second trimester: Secondary | ICD-10-CM

## 2020-02-29 ENCOUNTER — Telehealth: Payer: Self-pay | Admitting: *Deleted

## 2020-02-29 ENCOUNTER — Other Ambulatory Visit: Payer: Self-pay | Admitting: Obstetrics and Gynecology

## 2020-02-29 ENCOUNTER — Inpatient Hospital Stay (HOSPITAL_COMMUNITY)
Admission: AD | Admit: 2020-02-29 | Discharge: 2020-02-29 | Disposition: A | Discharge status: Home / Self Care | Payer: Medicaid Other | Attending: Family Medicine | Admitting: Family Medicine

## 2020-02-29 ENCOUNTER — Inpatient Hospital Stay (HOSPITAL_BASED_OUTPATIENT_CLINIC_OR_DEPARTMENT_OTHER): Payer: Medicaid Other

## 2020-02-29 ENCOUNTER — Encounter (HOSPITAL_COMMUNITY): Payer: Self-pay | Admitting: Family Medicine

## 2020-02-29 ENCOUNTER — Other Ambulatory Visit: Payer: Self-pay

## 2020-02-29 DIAGNOSIS — Z7982 Long term (current) use of aspirin: Secondary | ICD-10-CM | POA: Insufficient documentation

## 2020-02-29 DIAGNOSIS — Z79899 Other long term (current) drug therapy: Secondary | ICD-10-CM | POA: Diagnosis not present

## 2020-02-29 DIAGNOSIS — R102 Pelvic and perineal pain: Secondary | ICD-10-CM | POA: Diagnosis not present

## 2020-02-29 DIAGNOSIS — D259 Leiomyoma of uterus, unspecified: Secondary | ICD-10-CM

## 2020-02-29 DIAGNOSIS — O3412 Maternal care for benign tumor of corpus uteri, second trimester: Secondary | ICD-10-CM

## 2020-02-29 DIAGNOSIS — Z3A22 22 weeks gestation of pregnancy: Secondary | ICD-10-CM

## 2020-02-29 DIAGNOSIS — E669 Obesity, unspecified: Secondary | ICD-10-CM

## 2020-02-29 DIAGNOSIS — O99891 Other specified diseases and conditions complicating pregnancy: Secondary | ICD-10-CM

## 2020-02-29 DIAGNOSIS — N9489 Other specified conditions associated with female genital organs and menstrual cycle: Secondary | ICD-10-CM

## 2020-02-29 DIAGNOSIS — O30042 Twin pregnancy, dichorionic/diamniotic, second trimester: Secondary | ICD-10-CM | POA: Diagnosis not present

## 2020-02-29 DIAGNOSIS — O26872 Cervical shortening, second trimester: Secondary | ICD-10-CM

## 2020-02-29 DIAGNOSIS — M545 Low back pain, unspecified: Secondary | ICD-10-CM | POA: Diagnosis not present

## 2020-02-29 DIAGNOSIS — O26892 Other specified pregnancy related conditions, second trimester: Secondary | ICD-10-CM | POA: Diagnosis not present

## 2020-02-29 DIAGNOSIS — M549 Dorsalgia, unspecified: Secondary | ICD-10-CM

## 2020-02-29 DIAGNOSIS — U071 COVID-19: Secondary | ICD-10-CM | POA: Diagnosis not present

## 2020-02-29 DIAGNOSIS — E0590 Thyrotoxicosis, unspecified without thyrotoxic crisis or storm: Secondary | ICD-10-CM

## 2020-02-29 DIAGNOSIS — O99212 Obesity complicating pregnancy, second trimester: Secondary | ICD-10-CM

## 2020-02-29 DIAGNOSIS — O99282 Endocrine, nutritional and metabolic diseases complicating pregnancy, second trimester: Secondary | ICD-10-CM

## 2020-02-29 DIAGNOSIS — O26879 Cervical shortening, unspecified trimester: Secondary | ICD-10-CM | POA: Diagnosis present

## 2020-02-29 DIAGNOSIS — O26899 Other specified pregnancy related conditions, unspecified trimester: Secondary | ICD-10-CM | POA: Diagnosis present

## 2020-02-29 DIAGNOSIS — D219 Benign neoplasm of connective and other soft tissue, unspecified: Secondary | ICD-10-CM

## 2020-02-29 DIAGNOSIS — Z87891 Personal history of nicotine dependence: Secondary | ICD-10-CM | POA: Insufficient documentation

## 2020-02-29 DIAGNOSIS — O98512 Other viral diseases complicating pregnancy, second trimester: Secondary | ICD-10-CM

## 2020-02-29 LAB — URINALYSIS, ROUTINE W REFLEX MICROSCOPIC
Bilirubin Urine: NEGATIVE
Glucose, UA: NEGATIVE mg/dL
Hgb urine dipstick: NEGATIVE
Ketones, ur: NEGATIVE mg/dL
Leukocytes,Ua: NEGATIVE
Nitrite: NEGATIVE
Protein, ur: NEGATIVE mg/dL
Specific Gravity, Urine: 1.025 (ref 1.005–1.030)
pH: 6 (ref 5.0–8.0)

## 2020-02-29 LAB — WET PREP, GENITAL
Clue Cells Wet Prep HPF POC: NONE SEEN
Sperm: NONE SEEN
Trich, Wet Prep: NONE SEEN
Yeast Wet Prep HPF POC: NONE SEEN

## 2020-02-29 MED ORDER — ACETAMINOPHEN 500 MG PO TABS
1000.0000 mg | ORAL_TABLET | Freq: Once | ORAL | Status: AC
  Administered 2020-02-29: 1000 mg via ORAL
  Filled 2020-02-29: qty 2

## 2020-02-29 MED ORDER — ACETAMINOPHEN 500 MG PO TABS: 1000.0000 mg | ORAL_TABLET | Freq: Four times a day (QID) | ORAL | 2 refills | Status: DC | PRN

## 2020-02-29 MED ORDER — CYCLOBENZAPRINE HCL 10 MG PO TABS: 10.0000 mg | ORAL_TABLET | Freq: Three times a day (TID) | ORAL | 0 refills | Status: DC | PRN

## 2020-02-29 MED ORDER — PROGESTERONE MICRONIZED 200 MG PO CAPS: 200.0000 mg | ORAL_CAPSULE | Freq: Every day | ORAL | 1 refills | Status: DC

## 2020-02-29 MED ORDER — CYCLOBENZAPRINE HCL 5 MG PO TABS
10.0000 mg | ORAL_TABLET | Freq: Once | ORAL | Status: AC
  Administered 2020-02-29: 10 mg via ORAL
  Filled 2020-02-29: qty 2

## 2020-02-29 NOTE — Telephone Encounter (Addendum)
Patient called with c/o sharp, constant pain in her lower abdomen radiating to her back.  She is also having an increase in pelvic and rectal pressure.  Denies bleeding, discharge, leaking of fluid or urinary symptoms. While assessing patient over the phone, I could tell she was uncomfortable.  I advised patient to be seen either at our office or at the hospital.  Pt stated she needed to pick up her child from the bus stop at 2:50.  I offered for her to come at 1:30 to be seen by Dr Rip Harbour and pt agreeable to do so.  Advised that in the meantime, if she became more uncomfortable, to go to the hospital.  Pt verbalized understanding and agreeable to do so.

## 2020-02-29 NOTE — Progress Notes (Signed)
RX: Prometrium 200 mg vaginally at bedtime.    Lezlie Lye, NP 02/29/2020 5:01 PM

## 2020-02-29 NOTE — MAU Note (Signed)
Presents with c/o lower abdominal, back, and pelvic pain/pressure.  Also reports rectal pressure.  Denies VB or LOF.  Endorses +FM.

## 2020-02-29 NOTE — Discharge Instructions (Signed)
Activity Restriction During Pregnancy Your health care provider may recommend specific activity restrictions during pregnancy for a variety of reasons. Activity restriction may require that you limit activities that require great effort, such as exercise, lifting, or sex. The type of activity restriction will vary for each person, depending on your risk or the problems you are having. Activity restriction may be recommended for a period of time until your baby is delivered. Why are activity restrictions recommended? Activity restriction may be recommended if:  Your placenta is partially or completely covering the opening of your cervix (placenta previa).  There is bleeding between the wall of the uterus and the amniotic sac in the first trimester of pregnancy (subchorionic hemorrhage).  You went into labor too early (preterm labor).  You have a history of miscarriage.  You have a condition that causes high blood pressure during pregnancy (preeclampsia or eclampsia).  You are pregnant with more than one baby.  Your baby is not growing well. What are the risks? The risks depend on your specific restriction. Strict bed rest has the most physical and emotional risks and is no longer routinely recommended. Risks of strict bed rest include:  Loss of muscle conditioning from not moving.  Blood clots.  Social isolation.  Depression.  Loss of income. Talk with your health care team about activity restriction to decide if it is best for you and your baby. Even if you are having problems during your pregnancy, you may be able to continue with normal levels of activity with careful monitoring by your health care team. Follow these instructions at home: If needed, based on your overall health and the health of your baby, your health care provider will decide which type of activity restriction is right for you. Activity restrictions may include:  Not lifting anything heavier than 10 pounds (4.5  kg).  Avoiding activities that take a lot of physical effort.  No lifting or straining.  Resting in a sitting position or lying down for periods of time during the day. Pelvic rest may be recommended along with activity restrictions. If pelvic rest is recommended, then:  Do not have sex, an orgasm, or use sexual stimulation.  Do not use tampons. Do not douche. Do not put anything into your vagina.  Do not lift anything that is heavier than 10 lb (4.5 kg).  Avoid activities that require a lot of effort.  Avoid any activity in which your pelvic muscles could become strained, such as squatting.   Questions to ask your health care provider  Why is my activity being limited?  How will activity restrictions affect my body?  Why is rest helpful for me and my baby?  What activities can I do?  When can I return to normal activities? When should I seek immediate medical care? Seek immediate medical care if you have:  Vaginal bleeding.  Vaginal discharge.  Cramping pain in your lower abdomen.  Regular contractions.  A low, dull backache. Summary  Your health care provider may recommend specific activity restrictions during pregnancy for a variety of reasons.  Activity restriction may require that you limit activities such as exercise, lifting, sex, or any other activity that requires great effort.  Discuss the risks and benefits of activity restriction with your health care team to decide if it is best for you and your baby.  Contact your health care provider right away if you think you are having contractions, or if you notice vaginal bleeding, discharge, or cramping. This information  is not intended to replace advice given to you by your health care provider. Make sure you discuss any questions you have with your health care provider. Document Revised: 09/29/2018 Document Reviewed: 04/28/2017 Elsevier Patient Education  [place].

## 2020-02-29 NOTE — MAU Provider Note (Signed)
History     CSN: [id number]  Arrival date and time: 02/29/20 1304   Event Date/Time   First Provider Initiated Contact with Patient 02/29/20 1408      Chief Complaint  Patient presents with  . Abdominal Pain  . Pelvic Pain  . Back Pain   HPI   Ms.ADLAI NIEBLAS is a 30 y.o. female G21P1011 Di/Di twins @ [redacted]w[redacted]d here with right sided pain that radiates around to her lower back and buttocks. At times the pain feels like pressure in her lower pelvis. The pain started around lunch time today around 1200.  She was taking a nap and the pain woke her up. She has not tried anything for the pain. She currently rates her pain 6/10 and says it is slightly better than it was when it started. Her Last BM was this morning and was normal. She feels the urge to have a BM but nothing will come out, she does not feel constipated.   Denies recent intercourse. She reports picking up her child's dog often lately. She is aware she should not be doing this but reports it is out of habit.   Hx of 5cm X 5 cm fibroid.    OB History    Gravida  3   Para  1   Term  1   Preterm  0   AB  1   Living  1     SAB  0   IAB  1   Ectopic  0   Multiple  0   Live Births  1           Past Medical History:  Diagnosis Date  . Anxiety   . Depression   . Fibroid   . Graves disease   . Headache   . Liver mass   . Migraines   . PID (acute pelvic inflammatory disease) 07/2019    Past Surgical History:  Procedure Laterality Date  . CHOLECYSTECTOMY    . RESECTION LIVER      Family History  Problem Relation Age of Onset  . Diabetes Mother   . Hyperthyroidism Mother   . Migraines Father   . Cancer Other   . Hyperthyroidism Brother   . Hypertension Brother   . Cancer Maternal Grandmother   . Cancer Maternal Grandfather     Social History   Tobacco Use  . Smoking status: Former Smoker    Packs/day: 0.00    Types: Cigars    Quit date: 01/27/2018    Years since quitting: 2.0  . Smokeless  tobacco: Never Used  Vaping Use  . Vaping Use: Never used  Substance Use Topics  . Alcohol use: Not Currently    Comment: occasionally  . Drug use: Not Currently    Types: Marijuana    Comment: last used mid October    Allergies: No Known Allergies  Medications Prior to Admission  Medication Sig Dispense Refill Last Dose  . aspirin EC 81 MG tablet Take 1 tablet (81 mg total) by mouth daily. Take after 12 weeks for prevention of preeclampsia later in pregnancy 300 tablet 2 02/29/2020 at Unknown time  . famotidine (PEPCID) 20 MG tablet Take 1 tablet (20 mg total) by mouth 2 (two) times daily. 30 tablet 3 Past Week at Unknown time  . ondansetron (ZOFRAN) 4 MG tablet Take 1 tablet (4 mg total) by mouth every 6 (six) hours. 20 tablet 0 Past Week at Unknown time  . potassium chloride SA (KLOR-CON)  20 MEQ tablet Take 1 tablet (20 mEq total) by mouth 2 (two) times daily. 10 tablet 0 02/29/2020 at Unknown time  . Blood Pressure Monitoring DEVI 1 each by Does not apply route once a week. 1 each 0   . Elastic Bandages & Supports (COMFORT FIT MATERNITY SUPP LG) MISC 1 application by Does not apply route daily. 1 each 0   . Prenatal Vit-Fe Fumarate-FA (PRENATAL MULTIVITAMIN) TABS tablet Take 1 tablet by mouth daily at 12 noon.      Results for orders placed or performed during the hospital encounter of 02/29/20 (from the past 48 hour(s))  Urinalysis, Routine w reflex microscopic Urine, Clean Catch     Status: None   Collection Time: 02/29/20  2:23 PM  Result Value Ref Range   Color, Urine YELLOW YELLOW   APPearance CLEAR CLEAR   Specific Gravity, Urine 1.025 1.005 - 1.030   pH 6.0 5.0 - 8.0   Glucose, UA NEGATIVE NEGATIVE mg/dL   Hgb urine dipstick NEGATIVE NEGATIVE   Bilirubin Urine NEGATIVE NEGATIVE   Ketones, ur NEGATIVE NEGATIVE mg/dL   Protein, ur NEGATIVE NEGATIVE mg/dL   Nitrite NEGATIVE NEGATIVE   Leukocytes,Ua NEGATIVE NEGATIVE    Comment: Microscopic not done on urines with negative  protein, blood, leukocytes, nitrite, or glucose < 500 mg/dL. Performed at [facility], [place] [address] [place], [place] [address]   Wet prep, genital     Status: Abnormal   Collection Time: 02/29/20  3:15 PM  Result Value Ref Range   Yeast Wet Prep HPF POC NONE SEEN NONE SEEN   Trich, Wet Prep NONE SEEN NONE SEEN   Clue Cells Wet Prep HPF POC NONE SEEN NONE SEEN   WBC, Wet Prep HPF POC FEW (A) NONE SEEN   Sperm NONE SEEN     Comment: Performed at [facility], [place] [address] [place], [place] [address]   Review of Systems  Constitutional: Negative for fever.  Gastrointestinal: Negative for abdominal pain.  Genitourinary: Negative for dysuria, flank pain, urgency, vaginal bleeding, vaginal discharge and vaginal pain.  Musculoskeletal: Positive for back pain.   Physical Exam   Blood pressure 116/72, pulse (!) 115, temperature 98.3 F (36.8 C), temperature source Oral, resp. rate 18, height 5\' 3"  (1.6 m), weight 102.3 kg, last menstrual period 09/25/2019, SpO2 99 %, unknown if currently breastfeeding.  Physical Exam Constitutional:      General: She is not in acute distress.    Appearance: Normal appearance. She is not ill-appearing or toxic-appearing.  HENT:     Head: Normocephalic.  Abdominal:     Tenderness: There is no abdominal tenderness. There is no right CVA tenderness or left CVA tenderness.  Genitourinary:    Comments: Dilation: Closed Effacement (%): Thick Exam by:: J, Nashalie Sallis, Musculoskeletal:        General: Normal range of motion.       Arms:     Comments: No CVA tenderness, minimal lower back pain with deep palpation.   Skin:    General: Skin is warm.  Neurological:     Mental Status: She is alert and oriented to person, place, and time.  Psychiatric:        Behavior: Behavior normal.     MAU Course  Procedures  None  MDM  Wet prep and GC collected UA and Urine culture sent.  US OB MFM [place] for cervical length shows cervix of 2.6 cm,  which is down from 4.2 cm on  2/4. She was given tylenol and flexeril today and reports significant improvement in back pain and HA.  She has an appointment with Dr. Elonda Husky tomorrow and an MFM [place] scheduled in 2 weeks.   Assessment and Plan   A:  1. [redacted] weeks gestation of pregnancy   2. Fibroid   3. Dichorionic diamniotic twin pregnancy in second trimester   4. Pelvic pressure in pregnancy, antepartum   5. Cervix, short (affecting pregnancy)     P:  Discharge home with strict return precautions.  Rx: Flexeril, tylenol Keep your appointment with Dr. Elonda Husky tomorrow. Message sent to Dr. Elonda Husky through epic regarding cervical length. Pelvic rest REST Increase oral fluid intake.  Lezlie Lye, NP 02/29/2020 4:15 PM

## 2020-03-01 ENCOUNTER — Ambulatory Visit (INDEPENDENT_AMBULATORY_CARE_PROVIDER_SITE_OTHER): Payer: Medicaid Other | Admitting: Obstetrics & Gynecology

## 2020-03-01 ENCOUNTER — Encounter: Payer: Self-pay | Admitting: Obstetrics & Gynecology

## 2020-03-01 VITALS — BP 117/74 | HR 112 | Wt 223.0 lb

## 2020-03-01 DIAGNOSIS — O30042 Twin pregnancy, dichorionic/diamniotic, second trimester: Secondary | ICD-10-CM

## 2020-03-01 DIAGNOSIS — O99210 Obesity complicating pregnancy, unspecified trimester: Secondary | ICD-10-CM

## 2020-03-01 DIAGNOSIS — D219 Benign neoplasm of connective and other soft tissue, unspecified: Secondary | ICD-10-CM

## 2020-03-01 DIAGNOSIS — Z331 Pregnant state, incidental: Secondary | ICD-10-CM

## 2020-03-01 DIAGNOSIS — Z1389 Encounter for screening for other disorder: Secondary | ICD-10-CM

## 2020-03-01 DIAGNOSIS — E876 Hypokalemia: Secondary | ICD-10-CM

## 2020-03-01 DIAGNOSIS — Z3A21 21 weeks gestation of pregnancy: Secondary | ICD-10-CM

## 2020-03-01 DIAGNOSIS — O0990 Supervision of high risk pregnancy, unspecified, unspecified trimester: Secondary | ICD-10-CM

## 2020-03-01 LAB — GC/CHLAMYDIA PROBE AMP (~~LOC~~) NOT AT ARMC
Chlamydia: NEGATIVE
Comment: NEGATIVE
Comment: NORMAL
Neisseria Gonorrhea: NEGATIVE

## 2020-03-01 LAB — POCT URINALYSIS DIPSTICK OB
Blood, UA: NEGATIVE
Glucose, UA: NEGATIVE
Ketones, UA: NEGATIVE
Leukocytes, UA: NEGATIVE
Nitrite, UA: NEGATIVE
POC,PROTEIN,UA: NEGATIVE

## 2020-03-01 MED ORDER — POTASSIUM CHLORIDE CRYS ER 20 MEQ PO TBCR: 20.0000 meq | EXTENDED_RELEASE_TABLET | Freq: Two times a day (BID) | ORAL | 3 refills | Status: DC

## 2020-03-01 NOTE — Progress Notes (Signed)
HIGH-RISK PREGNANCY VISIT Patient name: KADRA KOHAN MRN [id number]  Date of birth: 1990/09/13 Chief Complaint:   Routine Prenatal Visit (Potassium check)  History of Present Illness:   Misty Murillo is a 30 y.o. G83P1011 female at [redacted]w[redacted]d with an Estimated Date of Delivery: 07/01/20 being seen today for ongoing management of a high-risk pregnancy complicated by DiDi twins with borderline cervical length.  Today she reports no complaints.  Depression screen [facility] 2/9 12/19/2019 12/12/2019 07/21/2018 03/05/2018 05/18/2017  Decreased Interest 2 3 1 3 1   Down, Depressed, Hopeless 0 1 0 3 0  PHQ - 2 Score 2 4 1 6 1   Altered sleeping 0 3 0 3 -  Tired, decreased energy 2 3 3 3 2   Change in appetite 2 3 2 3  0  Feeling bad or failure about yourself  0 0 0 3 0  Trouble concentrating 0 0 0 0 0  Moving slowly or fidgety/restless 0 0 0 0 0  Suicidal thoughts 0 0 0 0 0  PHQ-9 Score 6 13 6 18  -  Difficult doing work/chores - - Not difficult at all Somewhat difficult -    Contractions: Not present. Vag. Bleeding: None.  Movement: Present. denies leaking of fluid.  Review of Systems:   Pertinent items are noted in HPI Denies abnormal vaginal discharge w/ itching/odor/irritation, headaches, visual changes, shortness of breath, chest pain, abdominal pain, severe nausea/vomiting, or problems with urination or bowel movements unless otherwise stated above. Pertinent History Reviewed:  Reviewed past medical,surgical, social, obstetrical and family history.  Reviewed problem list, medications and allergies. Physical Assessment:   Vitals:   03/01/20 1428  BP: 117/74  Pulse: (!) 112  Weight: 223 lb (101.2 kg)  Body mass index is 39.5 kg/m.           Physical Examination:   General appearance: alert, well appearing, and in no distress  Mental status: alert, oriented to person, place, and time  Skin: warm & dry   Extremities: Edema: Trace    Cardiovascular: normal heart rate noted  Respiratory: normal  respiratory effort, no distress  Abdomen: gravid, soft, non-tender  Pelvic: Cervical exam deferred         Fetal Status: Fetal Heart Rate (bpm): 162/158 Fundal Height: 30 cm Movement: Present    Fetal Surveillance Testing today: FHR x 2   Chaperone: N/A    Results for orders placed or performed in visit on 03/01/20 (from the past 24 hour(s))  POC Urinalysis Dipstick OB   Collection Time: 03/01/20  2:32 PM  Result Value Ref Range   Color, UA     Clarity, UA     Glucose, UA Negative Negative   Bilirubin, UA     Ketones, UA neg    Spec Grav, UA     Blood, UA neg    pH, UA     POC,PROTEIN,UA Negative Negative, Trace, Small (1+), Moderate (2+), Large (3+), 4+   Urobilinogen, UA     Nitrite, UA neg    Leukocytes, UA Negative Negative   Appearance     Odor      Assessment & Plan:  High-risk pregnancy: G3P1011 at [redacted]w[redacted]d with an Estimated Date of Delivery: 07/01/20   1) DiDi twins, stable  2) borderline cervicl length, recheck 03/12/20  Meds:  Meds ordered this encounter  Medications  . potassium chloride SA (KLOR-CON) 20 MEQ tablet    Sig: Take 1 tablet (20 mEq total) by mouth 2 (two) times daily.  Dispense:  60 tablet    Refill:  3    Labs/procedures today: none  Treatment Plan:  Begin prometrium as ordered  Reviewed: Preterm labor symptoms and general obstetric precautions including but not limited to vaginal bleeding, contractions, leaking of fluid and fetal movement were reviewed in detail with the patient.  All questions were answered. Has home bp cuff. Rx faxed to . Check bp weekly, let us know if >140/90.   Follow-up: Return for keep scheduled.   Future Appointments  Date Time Provider [place]  03/12/2020 10:00 AM [facility] [place] [facility] None  03/12/2020 11:10 AM Roma Schanz, CNM [facility]  03/23/2020 11:00 AM [facility] NURSE [facility]  03/23/2020 11:15 AM [facility] US2 [facility]  03/29/2020  8:30 AM [facility] LAB [facility] None   04/03/2020  4:15 PM Elayne Snare, MD [facility] None    Orders Placed This Encounter  Procedures  . POC Urinalysis Dipstick OB   Mertie Clause Eure  03/01/2020 3:20 PM

## 2020-03-02 LAB — CULTURE, OB URINE

## 2020-03-08 ENCOUNTER — Telehealth: Payer: Self-pay

## 2020-03-08 MED ORDER — PANTOPRAZOLE SODIUM 20 MG PO TBEC: 20.0000 mg | DELAYED_RELEASE_TABLET | Freq: Every day | ORAL | 3 refills | Status: DC

## 2020-03-08 NOTE — Telephone Encounter (Signed)
Pt called stating that she is having a lot of discomfort the acid reflux, she stated that she is taking pepcid for this issue but doesn't seem to be helping the issue, she also stated that she she has had a dry nonproductive cough since recovering from [place] and it feels like she always has something in her throat.

## 2020-03-08 NOTE — Telephone Encounter (Signed)
Pt says pepcid not helping her heat burn. Also has a dry cough left from when she had covid. Always feels like something is in her throat.

## 2020-03-12 ENCOUNTER — Other Ambulatory Visit: Payer: Self-pay | Admitting: Women's Health

## 2020-03-12 ENCOUNTER — Encounter: Payer: Self-pay | Admitting: Women's Health

## 2020-03-12 ENCOUNTER — Ambulatory Visit (INDEPENDENT_AMBULATORY_CARE_PROVIDER_SITE_OTHER): Payer: Medicaid Other | Admitting: Women's Health

## 2020-03-12 ENCOUNTER — Ambulatory Visit (INDEPENDENT_AMBULATORY_CARE_PROVIDER_SITE_OTHER): Payer: Medicaid Other

## 2020-03-12 ENCOUNTER — Other Ambulatory Visit: Payer: Self-pay

## 2020-03-12 VITALS — BP 132/79 | HR 105 | Wt 226.0 lb

## 2020-03-12 DIAGNOSIS — O0992 Supervision of high risk pregnancy, unspecified, second trimester: Secondary | ICD-10-CM | POA: Diagnosis not present

## 2020-03-12 DIAGNOSIS — O26879 Cervical shortening, unspecified trimester: Secondary | ICD-10-CM

## 2020-03-12 DIAGNOSIS — Z0375 Encounter for suspected cervical shortening ruled out: Secondary | ICD-10-CM | POA: Diagnosis not present

## 2020-03-12 DIAGNOSIS — Z3A24 24 weeks gestation of pregnancy: Secondary | ICD-10-CM

## 2020-03-12 DIAGNOSIS — E0500 Thyrotoxicosis with diffuse goiter without thyrotoxic crisis or storm: Secondary | ICD-10-CM

## 2020-03-12 DIAGNOSIS — O0990 Supervision of high risk pregnancy, unspecified, unspecified trimester: Secondary | ICD-10-CM

## 2020-03-12 DIAGNOSIS — O30042 Twin pregnancy, dichorionic/diamniotic, second trimester: Secondary | ICD-10-CM

## 2020-03-12 DIAGNOSIS — O99210 Obesity complicating pregnancy, unspecified trimester: Secondary | ICD-10-CM

## 2020-03-12 DIAGNOSIS — E876 Hypokalemia: Secondary | ICD-10-CM

## 2020-03-12 DIAGNOSIS — D573 Sickle-cell trait: Secondary | ICD-10-CM

## 2020-03-12 DIAGNOSIS — D219 Benign neoplasm of connective and other soft tissue, unspecified: Secondary | ICD-10-CM

## 2020-03-12 LAB — POCT URINALYSIS DIPSTICK OB
Blood, UA: NEGATIVE
Glucose, UA: NEGATIVE
Ketones, UA: NEGATIVE
Leukocytes, UA: NEGATIVE
Nitrite, UA: NEGATIVE
POC,PROTEIN,UA: NEGATIVE

## 2020-03-12 NOTE — Progress Notes (Signed)
[place] TA/TV:24+1 wks,DI/DI twins,mult fibroids,largest fibroid anterior 6.8 x 4.2 x 6.2 cm,cx length with pressure 2.5cm Baby A inferior right,breech,posterior placenta gr 0,svp of fluid 6.5 cm,fhr 154 bpm,efw 670 g 43%,discordance 2.3% [name]:superior left,breech,anterior placenta gr 0,svp of fluid 5.7 cm,fhr 142 bpm,efw 686 g 50%

## 2020-03-12 NOTE — Progress Notes (Signed)
HIGH-RISK PREGNANCY VISIT Patient name: Misty Murillo MRN [id number]  Date of birth: 02-Jul-1990 Chief Complaint:   Routine Prenatal Visit  History of Present Illness:   Misty Murillo is a 30 y.o. G42P1011 female at [redacted]w[redacted]d with an Estimated Date of Delivery: 07/01/20 being seen today for ongoing management of a high-risk pregnancy complicated by multiple gestation Di-Di twins & graves disease- no meds, borderline short cx, 9d hospitalization for Covid at end of Jan.  Today she reports feeling much better, has lingering dry cough. Unable to keep K+ down. .  Depression screen [facility] 2/9 12/19/2019 12/12/2019 07/21/2018 03/05/2018 05/18/2017  Decreased Interest 2 3 1 3 1   Down, Depressed, Hopeless 0 1 0 3 0  PHQ - 2 Score 2 4 1 6 1   Altered sleeping 0 3 0 3 -  Tired, decreased energy 2 3 3 3 2   Change in appetite 2 3 2 3  0  Feeling bad or failure about yourself  0 0 0 3 0  Trouble concentrating 0 0 0 0 0  Moving slowly or fidgety/restless 0 0 0 0 0  Suicidal thoughts 0 0 0 0 0  PHQ-9 Score 6 13 6 18  -  Difficult doing work/chores - - Not difficult at all Somewhat difficult -    Contractions: Not present. Vag. Bleeding: None.  Movement: Present. denies leaking of fluid.  Review of Systems:   Pertinent items are noted in HPI Denies abnormal vaginal discharge w/ itching/odor/irritation, headaches, visual changes, shortness of breath, chest pain, abdominal pain, severe nausea/vomiting, or problems with urination or bowel movements unless otherwise stated above. Pertinent History Reviewed:  Reviewed past medical,surgical, social, obstetrical and family history.  Reviewed problem list, medications and allergies. Physical Assessment:   Vitals:   03/12/20 1124  BP: 132/79  Pulse: (!) 105  Weight: 226 lb (102.5 kg)  Body mass index is 40.03 kg/m.           Physical Examination:   General appearance: alert, well appearing, and in no distress  Mental status: alert, oriented to person, place, and  time  Skin: warm & dry   Extremities: Edema: Trace    Cardiovascular: normal heart rate noted  Respiratory: normal respiratory effort, no distress  Abdomen: gravid, soft, non-tender  Pelvic: Cervical exam performed         Fetal Status: Fetal Heart Rate (bpm): 154/142   Movement: Present    Fetal Surveillance Testing today: u/s  [place] TA/TV:24+1 wks,DI/DI twins,mult fibroids,largest fibroid anterior 6.8 x 4.2 x 6.2 cm,cx length with pressure 2.5cm Baby A inferior right,breech,posterior placenta gr 0,svp of fluid 6.5 cm,fhr 154 bpm,efw 670 g 43%,discordance 2.3% [name]:superior left,breech,anterior placenta gr 0,svp of fluid 5.7 cm,fhr 142 bpm,efw 686 g 50%  Chaperone: N/A    No results found for this or any previous visit (from the past 24 hour(s)).  Assessment & Plan:  High-risk pregnancy: G3P1011 at [redacted]w[redacted]d with an Estimated Date of Delivery: 07/01/20   1) Di-Di twins, stable, discordance 2.3%  2) Graves disease, no meds, last TSH normal, has labs on 3/10 and appt w/ Dr. Dwyane Dee 3/15. Per MFM note 2/4, recommend TRAB antibodies (pt to let lab know on 3/10), serial growth u/s and weekly testing @ 32wks  3) SCT+> urine cx today  4) Borderline short cervix> stable, 2.5cm today, continue prometrium, repeat in 2wks  5) Trouble keeping K+down> take zofran 1hr prior  Meds: No orders of the defined types were placed in this encounter.  Labs/procedures today: urine culture and U/S  Treatment Plan:  CL q 2wks, EFW q4wks, weekly testing @ 32wks d/t Graves  Reviewed: Preterm labor symptoms and general obstetric precautions including but not limited to vaginal bleeding, contractions, leaking of fluid and fetal movement were reviewed in detail with the patient.  All questions were answered. Has home bp cuff.  Check bp weekly, let us know if >140/90.   Follow-up: Return in about 2 weeks (around 03/26/2020) for US:OB F/U cervical length and HROB w/ MD.   Future Appointments  Date Time Provider  [place]  03/27/2020  2:45 PM [facility] - [facility] [place] [facility] None  03/27/2020  3:50 PM Florian Buff, MD [facility]YN  03/29/2020  8:30 AM [facility] LAB [facility] None  04/03/2020  4:15 PM Elayne Snare, MD [facility] None    Orders Placed This Encounter  Procedures  . Urine Culture  . US OB Transvaginal   [place], [facility] 03/12/2020 12:33 PM

## 2020-03-12 NOTE — Patient Instructions (Signed)
Misty Murillo, I greatly value your feedback.  If you receive a survey following your visit with [place] today, we appreciate you taking the time to fill it out.  Thanks, Knute Neu, CNM, WHNP-BC  [facility] ([place], [place] [address]) Entrance C, located off of [address] parking  Go to [email or web address] to register for FREE online childbirth classes  [place] Pediatricians/Family Doctors:  [facility] [place] [phone]                 [place] [phone] (usually not accepting new patients unless you have family there already, you are always welcome to call and ask)       [facility] Department [phone]       [facility] Pediatricians/Family Doctors:   [facility]: [phone]  [facility]: [phone]  [facility]: [place] Doctors:   [facility]: [facility]: [place]:  [place]: [phone]    Home Blood Pressure Monitoring for Patients   Your provider has recommended that you check your blood pressure (BP) at least once a week at home. If you do not have a blood pressure cuff at home, one will be provided for you. Contact your provider if you have not received your monitor within 1 week.   Helpful Tips for Accurate Home Blood Pressure Checks  . Don't smoke, exercise, or drink caffeine 30 minutes before checking your BP . Use the restroom before checking your BP (a full bladder can raise your pressure) . Relax in a comfortable upright chair . Feet on the ground . Left arm resting comfortably on a flat surface at the level of your heart . Legs uncrossed . Back supported . Sit quietly and don't talk . Place the cuff on your bare arm . Adjust snuggly, so that  only two fingertips can fit between your skin and the top of the cuff . Check 2 readings separated by at least one minute . Keep a log of your BP readings . For a visual, please reference this diagram: http://ccnc.care/bpdiagram  Provider Name: [facility]     Phone: [phone]  Zone 1: ALL CLEAR  Continue to monitor your symptoms:  . BP reading is less than 140 (top number) or less than 90 (bottom number)  . No right upper stomach pain . No headaches or seeing spots . No feeling nauseated or throwing up . No swelling in face and hands  Zone 2: CAUTION Call your doctor's office for any of the following:  . BP reading is greater than 140 (top number) or greater than 90 (bottom number)  . Stomach pain under your ribs in the middle or right side . Headaches or seeing spots . Feeling nauseated or throwing up . Swelling in face and hands  Zone 3: EMERGENCY  Seek immediate medical care if you have any of the following:  . BP reading is greater than160 (top number) or greater than 110 (bottom number) . Severe headaches not improving with Tylenol . Serious difficulty catching your breath . Any worsening symptoms from Zone 2     Second Trimester of Pregnancy The second trimester is from week 14 through week 27 (months 4 through 6). The second trimester is often a time when you feel your  best. Your body has adjusted to being pregnant, and you begin to feel better physically. Usually, morning sickness has lessened or quit completely, you may have more energy, and you may have an increase in appetite. The second trimester is also a time when the fetus is growing rapidly. At the end of the sixth month, the fetus is about 9 inches long and weighs about 1 pounds. You will likely begin to feel the baby move (quickening) between 16 and 20 weeks of pregnancy. Body changes during your second trimester Your body continues to go through many changes during your second trimester. The changes  vary from woman to woman.  Your weight will continue to increase. You will notice your lower abdomen bulging out.  You may begin to get stretch marks on your hips, abdomen, and breasts.  You may develop headaches that can be relieved by medicines. The medicines should be approved by your health care provider.  You may urinate more often because the fetus is pressing on your bladder.  You may develop or continue to have heartburn as a result of your pregnancy.  You may develop constipation because certain hormones are causing the muscles that push waste through your intestines to slow down.  You may develop hemorrhoids or swollen, bulging veins (varicose veins).  You may have back pain. This is caused by: ? Weight gain. ? Pregnancy hormones that are relaxing the joints in your pelvis. ? A shift in weight and the muscles that support your balance.  Your breasts will continue to grow and they will continue to become tender.  Your gums may bleed and may be sensitive to brushing and flossing.  Dark spots or blotches (chloasma, mask of pregnancy) may develop on your face. This will likely fade after the baby is born.  A dark line from your belly button to the pubic area (linea nigra) may appear. This will likely fade after the baby is born.  You may have changes in your hair. These can include thickening of your hair, rapid growth, and changes in texture. Some women also have hair loss during or after pregnancy, or hair that feels dry or thin. Your hair will most likely return to normal after your baby is born.  What to expect at prenatal visits During a routine prenatal visit:  You will be weighed to make sure you and the fetus are growing normally.  Your blood pressure will be taken.  Your abdomen will be measured to track your baby's growth.  The fetal heartbeat will be listened to.  Any test results from the previous visit will be discussed.  Your health care provider may  ask you:  How you are feeling.  If you are feeling the baby move.  If you have had any abnormal symptoms, such as leaking fluid, bleeding, severe headaches, or abdominal cramping.  If you are using any tobacco products, including cigarettes, chewing tobacco, and electronic cigarettes.  If you have any questions.  Other tests that may be performed during your second trimester include:  Blood tests that check for: ? Low iron levels (anemia). ? High blood sugar that affects pregnant women (gestational diabetes) between 79 and 28 weeks. ? Rh antibodies. This is to check for a protein on red blood cells (Rh factor).  Urine tests to check for infections, diabetes, or protein in the urine.  An ultrasound to confirm the proper growth and development of the baby.  An amniocentesis to check for possible genetic problems.  Fetal  screens for spina bifida and Down syndrome.  HIV (human immunodeficiency virus) testing. Routine prenatal testing includes screening for HIV, unless you choose not to have this test.  Follow these instructions at home: Medicines  Follow your health care provider's instructions regarding medicine use. Specific medicines may be either safe or unsafe to take during pregnancy.  Take a prenatal vitamin that contains at least 600 micrograms (mcg) of folic acid.  If you develop constipation, try taking a stool softener if your health care provider approves. Eating and drinking  Eat a balanced diet that includes fresh fruits and vegetables, whole grains, good sources of protein such as meat, eggs, or tofu, and low-fat dairy. Your health care provider will help you determine the amount of weight gain that is right for you.  Avoid raw meat and uncooked cheese. These carry germs that can cause birth defects in the baby.  If you have low calcium intake from food, talk to your health care provider about whether you should take a daily calcium supplement.  Limit foods  that are high in fat and processed sugars, such as fried and sweet foods.  To prevent constipation: ? Drink enough fluid to keep your urine clear or pale yellow. ? Eat foods that are high in fiber, such as fresh fruits and vegetables, whole grains, and beans. Activity  Exercise only as directed by your health care provider. Most women can continue their usual exercise routine during pregnancy. Try to exercise for 30 minutes at least 5 days a week. Stop exercising if you experience uterine contractions.  Avoid heavy lifting, wear low heel shoes, and practice good posture.  A sexual relationship may be continued unless your health care provider directs you otherwise. Relieving pain and discomfort  Wear a good support bra to prevent discomfort from breast tenderness.  Take warm sitz baths to soothe any pain or discomfort caused by hemorrhoids. Use hemorrhoid cream if your health care provider approves.  Rest with your legs elevated if you have leg cramps or low back pain.  If you develop varicose veins, wear support hose. Elevate your feet for 15 minutes, 3-4 times a day. Limit salt in your diet. Prenatal Care  Write down your questions. Take them to your prenatal visits.  Keep all your prenatal visits as told by your health care provider. This is important. Safety  Wear your seat belt at all times when driving.  Make a list of emergency phone numbers, including numbers for family, friends, the hospital, and police and fire departments. General instructions  Ask your health care provider for a referral to a local prenatal education class. Begin classes no later than the beginning of month 6 of your pregnancy.  Ask for help if you have counseling or nutritional needs during pregnancy. Your health care provider can offer advice or refer you to specialists for help with various needs.  Do not use hot tubs, steam rooms, or saunas.  Do not douche or use tampons or scented sanitary  pads.  Do not cross your legs for long periods of time.  Avoid cat litter boxes and soil used by cats. These carry germs that can cause birth defects in the baby and possibly loss of the fetus by miscarriage or stillbirth.  Avoid all smoking, herbs, alcohol, and unprescribed drugs. Chemicals in these products can affect the formation and growth of the baby.  Do not use any products that contain nicotine or tobacco, such as cigarettes and e-cigarettes. If you need help  quitting, ask your health care provider.  Visit your dentist if you have not gone yet during your pregnancy. Use a soft toothbrush to brush your teeth and be gentle when you floss. Contact a health care provider if:  You have dizziness.  You have mild pelvic cramps, pelvic pressure, or nagging pain in the abdominal area.  You have persistent nausea, vomiting, or diarrhea.  You have a bad smelling vaginal discharge.  You have pain when you urinate. Get help right away if:  You have a fever.  You are leaking fluid from your vagina.  You have spotting or bleeding from your vagina.  You have severe abdominal cramping or pain.  You have rapid weight gain or weight loss.  You have shortness of breath with chest pain.  You notice sudden or extreme swelling of your face, hands, ankles, feet, or legs.  You have not felt your baby move in over an hour.  You have severe headaches that do not go away when you take medicine.  You have vision changes. Summary  The second trimester is from week 14 through week 27 (months 4 through 6). It is also a time when the fetus is growing rapidly.  Your body goes through many changes during pregnancy. The changes vary from woman to woman.  Avoid all smoking, herbs, alcohol, and unprescribed drugs. These chemicals affect the formation and growth your baby.  Do not use any tobacco products, such as cigarettes, chewing tobacco, and e-cigarettes. If you need help quitting, ask your  health care provider.  Contact your health care provider if you have any questions. Keep all prenatal visits as told by your health care provider. This is important. This information is not intended to replace advice given to you by your health care provider. Make sure you discuss any questions you have with your health care provider. Document Released: 12/31/2000 Document Revised: 06/14/2015 Document Reviewed: 03/09/2012 Elsevier Interactive Patient Education  2017 [employer].

## 2020-03-14 LAB — URINE CULTURE

## 2020-03-23 ENCOUNTER — Ambulatory Visit: Payer: Medicaid Other

## 2020-03-26 ENCOUNTER — Encounter: Payer: Medicaid Other | Admitting: Obstetrics & Gynecology

## 2020-03-26 ENCOUNTER — Other Ambulatory Visit: Payer: Medicaid Other

## 2020-03-27 ENCOUNTER — Other Ambulatory Visit: Payer: Self-pay | Admitting: Women's Health

## 2020-03-27 ENCOUNTER — Encounter: Payer: Self-pay | Admitting: Obstetrics & Gynecology

## 2020-03-27 ENCOUNTER — Ambulatory Visit (INDEPENDENT_AMBULATORY_CARE_PROVIDER_SITE_OTHER): Payer: Medicaid Other | Admitting: Obstetrics & Gynecology

## 2020-03-27 ENCOUNTER — Ambulatory Visit (INDEPENDENT_AMBULATORY_CARE_PROVIDER_SITE_OTHER): Payer: Medicaid Other

## 2020-03-27 ENCOUNTER — Other Ambulatory Visit: Payer: Self-pay

## 2020-03-27 VITALS — BP 114/75 | HR 96 | Wt 238.0 lb

## 2020-03-27 DIAGNOSIS — O0992 Supervision of high risk pregnancy, unspecified, second trimester: Secondary | ICD-10-CM | POA: Diagnosis not present

## 2020-03-27 DIAGNOSIS — E876 Hypokalemia: Secondary | ICD-10-CM

## 2020-03-27 DIAGNOSIS — O99210 Obesity complicating pregnancy, unspecified trimester: Secondary | ICD-10-CM

## 2020-03-27 DIAGNOSIS — O26879 Cervical shortening, unspecified trimester: Secondary | ICD-10-CM

## 2020-03-27 DIAGNOSIS — O30042 Twin pregnancy, dichorionic/diamniotic, second trimester: Secondary | ICD-10-CM

## 2020-03-27 DIAGNOSIS — O0990 Supervision of high risk pregnancy, unspecified, unspecified trimester: Secondary | ICD-10-CM

## 2020-03-27 DIAGNOSIS — D219 Benign neoplasm of connective and other soft tissue, unspecified: Secondary | ICD-10-CM

## 2020-03-27 DIAGNOSIS — Z1389 Encounter for screening for other disorder: Secondary | ICD-10-CM

## 2020-03-27 LAB — POCT URINALYSIS DIPSTICK OB
Blood, UA: NEGATIVE
Glucose, UA: NEGATIVE
Leukocytes, UA: NEGATIVE
Nitrite, UA: NEGATIVE

## 2020-03-27 NOTE — Progress Notes (Signed)
HIGH-RISK PREGNANCY VISIT Patient name: Misty Murillo MRN [id number]  Date of birth: Sep 05, 1990 Chief Complaint:   Routine Prenatal Visit, High Risk Gestation, and Pregnancy Ultrasound  History of Present Illness:   Misty Murillo is a 30 y.o. G13P1011 female at [redacted]w[redacted]d with an Estimated Date of Delivery: 07/01/20 being seen today for ongoing management of a high-risk pregnancy complicated by multiple gestation DiDi twins with short cervix.  Today she reports no complaints.  Depression screen [facility] 2/9 12/19/2019 12/12/2019 07/21/2018 03/05/2018 05/18/2017  Decreased Interest 2 3 1 3 1   Down, Depressed, Hopeless 0 1 0 3 0  PHQ - 2 Score 2 4 1 6 1   Altered sleeping 0 3 0 3 -  Tired, decreased energy 2 3 3 3 2   Change in appetite 2 3 2 3  0  Feeling bad or failure about yourself  0 0 0 3 0  Trouble concentrating 0 0 0 0 0  Moving slowly or fidgety/restless 0 0 0 0 0  Suicidal thoughts 0 0 0 0 0  PHQ-9 Score 6 13 6 18  -  Difficult doing work/chores - - Not difficult at all Somewhat difficult -    Contractions: Irritability. Vag. Bleeding: None.  Movement: Present. denies leaking of fluid.  Review of Systems:   Pertinent items are noted in HPI Denies abnormal vaginal discharge w/ itching/odor/irritation, headaches, visual changes, shortness of breath, chest pain, abdominal pain, severe nausea/vomiting, or problems with urination or bowel movements unless otherwise stated above. Pertinent History Reviewed:  Reviewed past medical,surgical, social, obstetrical and family history.  Reviewed problem list, medications and allergies. Physical Assessment:   Vitals:   03/27/20 1559  BP: 114/75  Pulse: 96  Weight: 238 lb (108 kg)  Body mass index is 42.16 kg/m.           Physical Examination:   General appearance: alert, well appearing, and in no distress  Mental status: alert, oriented to person, place, and time  Skin: warm & dry   Extremities: Edema: None    Cardiovascular: normal heart rate  noted  Respiratory: normal respiratory effort, no distress  Abdomen: gravid, soft, non-tender  Pelvic: Cervical exam deferred         Fetal Status:     Movement: Present    Fetal Surveillance Testing today: sonogram   Chaperone: N/A    Results for orders placed or performed in visit on 03/27/20 (from the past 24 hour(s))  POC Urinalysis Dipstick OB   Collection Time: 03/27/20  4:01 PM  Result Value Ref Range   Color, UA     Clarity, UA     Glucose, UA Negative Negative   Bilirubin, UA     Ketones, UA small    Spec Grav, UA     Blood, UA neg    pH, UA     POC,PROTEIN,UA Trace Negative, Trace, Small (1+), Moderate (2+), Large (3+), 4+   Urobilinogen, UA     Nitrite, UA neg    Leukocytes, UA Negative Negative   Appearance     Odor      Assessment & Plan:  High-risk pregnancy: G3P1011 at [redacted]w[redacted]d with an Estimated Date of Delivery: 07/01/20   1) short cervix, stable on prometrium 200 mg pv qhs, stable  2) DiDi twins, stable  Meds: No orders of the defined types were placed in this encounter.   Labs/procedures today: U/S  Treatment Plan:  Sonogram for growth tracking next visit, PN2 next visit  Reviewed: Preterm labor  symptoms and general obstetric precautions including but not limited to vaginal bleeding, contractions, leaking of fluid and fetal movement were reviewed in detail with the patient.  All questions were answered. Has home bp cuff. Rx faxed to . Check bp weekly, let us know if >140/90.   Follow-up: No follow-ups on file.   Future Appointments  Date Time Provider [place]  03/29/2020  8:30 AM [facility] None  04/03/2020  4:15 PM Elayne Snare, MD [facility] None    Orders Placed This Encounter  Procedures  . POC Urinalysis Dipstick OB   Florian Buff  03/27/2020 4:11 PM

## 2020-03-27 NOTE — Progress Notes (Signed)
[place] TA/TV:26+2 wks DI/DI TWIN: CX length 3.4 w/o pressure,2.3 cm with pressure,anterior fibroid 7.5 x 4.8 x 6.3 cm BABY A:breech right,posterior placenta gr 0,fhr 138 bpm,svp of fluid 5.8 cm, [name]:cephalic left,anterior placenta gr 0,fhr 140 bpm,svp of fluid 5.3 cm

## 2020-03-29 ENCOUNTER — Other Ambulatory Visit: Payer: Self-pay

## 2020-03-29 ENCOUNTER — Other Ambulatory Visit (INDEPENDENT_AMBULATORY_CARE_PROVIDER_SITE_OTHER): Payer: Medicaid Other

## 2020-03-29 DIAGNOSIS — E0590 Thyrotoxicosis, unspecified without thyrotoxic crisis or storm: Secondary | ICD-10-CM

## 2020-03-29 LAB — TSH: TSH: 0.9 u[IU]/mL (ref 0.35–4.50)

## 2020-03-29 LAB — T3, FREE: T3, Free: 3.9 pg/mL (ref 2.3–4.2)

## 2020-03-29 LAB — T4, FREE: Free T4: 0.74 ng/dL (ref 0.60–1.60)

## 2020-04-02 NOTE — Progress Notes (Signed)
Patient ID: Misty Murillo, female   DOB: May 26, 1990, 30 y.o.   MRN: [id number]                                                                                                                     Reason for Appointment: History of hyperthyroidism, follow-up visit   I connected with the above-named patient by video enabled telemedicine application and verified that I am speaking with the correct person. The patient was explained the limitations of evaluation and management by telemedicine and the availability of in person appointments.  Patient also understood that there may be a patient responsible charge related to this service . Location of the patient: Patient's home . Location of the provider: Physician office Only the patient and myself were participating in the encounter The patient understood the above statements and agreed to proceed.   Chief complaint: Follow-up of thyroid   History of Present Illness:    The patient was being evaluated at her annual physical exam with her PCP in 2/20 symptoms of fatigue, anxiety and depression  She was seen in consultation in 03/2018 On her exam she had no abnormal findings and no goiter  However baseline free T3 was high at 4.6 with undetectable TSH  Since her thyrotropin receptor antibody was high normal she was started on methimazole 5 mg daily  On her follow-up in 04/2018 she had less fatigue although her weight had gone up  In July 2020 her thyroid was still consistently normal and methimazole 5 mg was continued In 09/2018 even with her thyroid levels being about the same her thyrotropin receptor antibody had come back to normal Subsequently methimazole was tapered off and stopped in 11/2018 However she did not come back for follow-up  RECENT HISTORY: She was retested for thyroid functions by her gynecologist with her early pregnancy in 11/21 Apart from her expected fatigue she has not had any shakiness, palpitations, unusual heat  intolerance  Thyroid levels showed a slightly low TSH in 11/21 but no free T4 or T3 available from then On her follow-up visit and her thyroid levels were back to normal including free T4, TSH was 0.35  Recently she has been feeling quite well without any palpitations or fatigue Thyroid levels are now improved with TSH 0.9 compared to 0.35  Wt Readings from Last 3 Encounters:  03/27/20 238 lb (108 kg)  03/12/20 226 lb (102.5 kg)  03/01/20 223 lb (101.2 kg)     Thyroid function tests as follows:     Lab Results  Component Value Date   FREET4 0.74 03/29/2020   FREET4 0.82 01/31/2020   FREET4 0.87 12/01/2018   T3FREE 3.9 03/29/2020   T3FREE 3.8 12/01/2018   T3FREE 3.7 07/22/2018   TSH 0.90 03/29/2020   TSH 0.35 01/31/2020   TSH 0.248 (L) 12/19/2019    Lab Results  Component Value Date   THYROTRECAB <1.10 10/11/2018   THYROTRECAB 1.73 04/07/2018     Allergies as of 04/03/2020  No Known Allergies     Medication List       Accurate as of April 02, 2020  9:25 PM. If you have any questions, ask your nurse or doctor.        acetaminophen 500 MG tablet Commonly known as: TYLENOL Take 2 tablets (1,000 mg total) by mouth every 6 (six) hours as needed.   aspirin EC 81 MG tablet Take 1 tablet (81 mg total) by mouth daily. Take after 12 weeks for prevention of preeclampsia later in pregnancy   Blood Pressure Monitoring Devi 1 each by Does not apply route once a week.   Comfort Fit Maternity Supp Lg Misc 1 application by Does not apply route daily.   cyclobenzaprine 10 MG tablet Commonly known as: FLEXERIL Take 1 tablet (10 mg total) by mouth 3 (three) times daily as needed for muscle spasms.   ondansetron 4 MG tablet Commonly known as: ZOFRAN Take 1 tablet (4 mg total) by mouth every 6 (six) hours.   pantoprazole 20 MG tablet Commonly known as: Protonix Take 1 tablet (20 mg total) by mouth daily.   potassium chloride SA 20 MEQ tablet Commonly known as:  KLOR-CON Take 1 tablet (20 mEq total) by mouth 2 (two) times daily.   prenatal multivitamin Tabs tablet Take 1 tablet by mouth daily at 12 noon.   progesterone 200 MG capsule Commonly known as: PROMETRIUM Place 1 capsule (200 mg total) vaginally at bedtime.   progesterone 200 MG capsule Commonly known as: PROMETRIUM Place 200 mg vaginally at bedtime.           Past Medical History:  Diagnosis Date  . Anxiety   . Depression   . Fibroid   . Graves disease   . Headache   . Liver mass   . Migraines   . PID (acute pelvic inflammatory disease) 07/2019    Past Surgical History:  Procedure Laterality Date  . CHOLECYSTECTOMY    . RESECTION LIVER      Family History  Problem Relation Age of Onset  . Diabetes Mother   . Hyperthyroidism Mother   . Migraines Father   . Cancer Other   . Hyperthyroidism Brother   . Hypertension Brother   . Cancer Maternal Grandmother   . Cancer Maternal Grandfather     Social History:  reports that she quit smoking about 2 years ago. Her smoking use included cigars. She smoked 0.00 packs per day. She has never used smokeless tobacco. She reports previous alcohol use. She reports previous drug use. Drug: Marijuana.  Allergies: No Known Allergies   Review of Systems     Examination:   LMP 09/25/2019     Assessment/Plan:  History of Hyperthyroidism, from mild Graves' disease   She had baseline symptoms of fatigue and anxiety and took methimazole in 2020 until 11/2018  Although her TSH was slightly low in 11/21 she was asymptomatic Subsequently thyroid levels have been progressively improved with TSH now 0.9 Subjectively doing well and she has no history of goiter recently  Reassured her that her current thyroid levels are normal and likely will not need any treatment or regular follow-up  She will however have her gynecologist check thyroid panel at her postpartum visit to make sure it is consistently normal   Elayne Snare 04/02/2020, 9:25 PM    Note: This office note was prepared with Dragon voice recognition system technology. Any transcriptional errors that result from this process are unintentional.

## 2020-04-03 ENCOUNTER — Other Ambulatory Visit: Payer: Self-pay

## 2020-04-03 ENCOUNTER — Telehealth (INDEPENDENT_AMBULATORY_CARE_PROVIDER_SITE_OTHER): Payer: Medicaid Other | Admitting: Endocrinology

## 2020-04-03 ENCOUNTER — Telehealth: Payer: Self-pay | Admitting: Women's Health

## 2020-04-03 ENCOUNTER — Encounter: Payer: Self-pay | Admitting: Endocrinology

## 2020-04-03 VITALS — Ht 64.0 in | Wt 215.0 lb

## 2020-04-03 DIAGNOSIS — Z8639 Personal history of other endocrine, nutritional and metabolic disease: Secondary | ICD-10-CM

## 2020-04-03 NOTE — Telephone Encounter (Signed)
Pt bilat foot swelling, started about 3/12, and increased abdominal cramps, mild to moderate, off & on throughout the day  Please advise & notify pt  Is scheduled for PN2, EFW/twins, & HROB on 04/10/2020

## 2020-04-03 NOTE — Telephone Encounter (Signed)
Returned pt's call for clarification. Pt stated that her legs stay swollen even after elevation. Abdominal cramps occur when moving or changing positions most of the time. Pt has been monitoring her BP with her home BP cuff and states that they are not elevated. Pt states that the babies are moving well and she is not having any bleeding/spotting. Pt was advised to drink more water, try some compression stockings, and elevate her feet above her heart when lying down. She will try these methods and report if there are any changes. Pt confirms understanding.

## 2020-04-09 ENCOUNTER — Inpatient Hospital Stay (HOSPITAL_COMMUNITY)
Admission: AD | Admit: 2020-04-09 | Discharge: 2020-04-09 | Disposition: A | Discharge status: Home / Self Care | Payer: Medicaid Other | Attending: Family Medicine | Admitting: Family Medicine

## 2020-04-09 ENCOUNTER — Other Ambulatory Visit: Payer: Self-pay | Admitting: Obstetrics & Gynecology

## 2020-04-09 ENCOUNTER — Other Ambulatory Visit: Payer: Self-pay

## 2020-04-09 ENCOUNTER — Encounter (HOSPITAL_COMMUNITY): Payer: Self-pay | Admitting: Obstetrics and Gynecology

## 2020-04-09 DIAGNOSIS — O30043 Twin pregnancy, dichorionic/diamniotic, third trimester: Secondary | ICD-10-CM | POA: Diagnosis not present

## 2020-04-09 DIAGNOSIS — E860 Dehydration: Secondary | ICD-10-CM | POA: Diagnosis not present

## 2020-04-09 DIAGNOSIS — K219 Gastro-esophageal reflux disease without esophagitis: Secondary | ICD-10-CM | POA: Diagnosis not present

## 2020-04-09 DIAGNOSIS — O210 Mild hyperemesis gravidarum: Secondary | ICD-10-CM | POA: Diagnosis not present

## 2020-04-09 DIAGNOSIS — O219 Vomiting of pregnancy, unspecified: Secondary | ICD-10-CM | POA: Diagnosis present

## 2020-04-09 DIAGNOSIS — Z8616 Personal history of COVID-19: Secondary | ICD-10-CM | POA: Insufficient documentation

## 2020-04-09 DIAGNOSIS — Z79890 Hormone replacement therapy (postmenopausal): Secondary | ICD-10-CM | POA: Diagnosis not present

## 2020-04-09 DIAGNOSIS — Z3A28 28 weeks gestation of pregnancy: Secondary | ICD-10-CM

## 2020-04-09 DIAGNOSIS — E0500 Thyrotoxicosis with diffuse goiter without thyrotoxic crisis or storm: Secondary | ICD-10-CM | POA: Insufficient documentation

## 2020-04-09 DIAGNOSIS — Z87891 Personal history of nicotine dependence: Secondary | ICD-10-CM | POA: Insufficient documentation

## 2020-04-09 DIAGNOSIS — E876 Hypokalemia: Secondary | ICD-10-CM | POA: Diagnosis not present

## 2020-04-09 DIAGNOSIS — O99613 Diseases of the digestive system complicating pregnancy, third trimester: Secondary | ICD-10-CM | POA: Diagnosis not present

## 2020-04-09 DIAGNOSIS — O99283 Endocrine, nutritional and metabolic diseases complicating pregnancy, third trimester: Secondary | ICD-10-CM | POA: Diagnosis not present

## 2020-04-09 DIAGNOSIS — O0990 Supervision of high risk pregnancy, unspecified, unspecified trimester: Secondary | ICD-10-CM

## 2020-04-09 DIAGNOSIS — D219 Benign neoplasm of connective and other soft tissue, unspecified: Secondary | ICD-10-CM

## 2020-04-09 DIAGNOSIS — O211 Hyperemesis gravidarum with metabolic disturbance: Secondary | ICD-10-CM | POA: Diagnosis not present

## 2020-04-09 DIAGNOSIS — Z79899 Other long term (current) drug therapy: Secondary | ICD-10-CM | POA: Diagnosis not present

## 2020-04-09 DIAGNOSIS — O30002 Twin pregnancy, unspecified number of placenta and unspecified number of amniotic sacs, second trimester: Secondary | ICD-10-CM

## 2020-04-09 DIAGNOSIS — Z7982 Long term (current) use of aspirin: Secondary | ICD-10-CM | POA: Insufficient documentation

## 2020-04-09 DIAGNOSIS — O99210 Obesity complicating pregnancy, unspecified trimester: Secondary | ICD-10-CM

## 2020-04-09 DIAGNOSIS — O30042 Twin pregnancy, dichorionic/diamniotic, second trimester: Secondary | ICD-10-CM

## 2020-04-09 DIAGNOSIS — R112 Nausea with vomiting, unspecified: Secondary | ICD-10-CM

## 2020-04-09 LAB — URINALYSIS, ROUTINE W REFLEX MICROSCOPIC
Glucose, UA: NEGATIVE mg/dL
Hgb urine dipstick: NEGATIVE
Ketones, ur: >80 mg/dL — AB
Leukocytes,Ua: NEGATIVE
Nitrite: NEGATIVE
Protein, ur: NEGATIVE mg/dL
Specific Gravity, Urine: 1.025 (ref 1.005–1.030)
pH: 6 (ref 5.0–8.0)

## 2020-04-09 LAB — COMPREHENSIVE METABOLIC PANEL
ALT: 13 U/L (ref 0–44)
AST: 28 U/L (ref 15–41)
Albumin: 2.3 g/dL — ABNORMAL LOW (ref 3.5–5.0)
Alkaline Phosphatase: 164 U/L — ABNORMAL HIGH (ref 38–126)
Anion gap: 9 (ref 5–15)
BUN: <5 mg/dL — ABNORMAL LOW (ref 6–20)
CO2: 21 mmol/L — ABNORMAL LOW (ref 22–32)
Calcium: 8.4 mg/dL — ABNORMAL LOW (ref 8.9–10.3)
Chloride: 105 mmol/L (ref 98–111)
Creatinine, Ser: 0.48 mg/dL (ref 0.44–1.00)
GFR, Estimated: >60 mL/min (ref >60)
Glucose, Bld: 77 mg/dL (ref 70–99)
Potassium: 2.9 mmol/L — ABNORMAL LOW (ref 3.5–5.1)
Sodium: 135 mmol/L (ref 135–145)
Total Bilirubin: 0.6 mg/dL (ref 0.3–1.2)
Total Protein: 6.1 g/dL — ABNORMAL LOW (ref 6.5–8.1)

## 2020-04-09 LAB — CBC
HCT: 29.8 % — ABNORMAL LOW (ref 36.0–46.0)
Hemoglobin: 10.1 g/dL — ABNORMAL LOW (ref 12.0–15.0)
MCH: 26.9 pg (ref 26.0–34.0)
MCHC: 33.9 g/dL (ref 30.0–36.0)
MCV: 79.3 fL — ABNORMAL LOW (ref 80.0–100.0)
Platelets: 362 10*3/uL (ref 150–400)
RBC: 3.76 MIL/uL — ABNORMAL LOW (ref 3.87–5.11)
RDW: 13.8 % (ref 11.5–15.5)
WBC: 7.9 10*3/uL (ref 4.0–10.5)
nRBC: 0 % (ref 0.0–0.2)

## 2020-04-09 LAB — LIPASE, BLOOD: Lipase: 20 U/L (ref 11–51)

## 2020-04-09 MED ORDER — LACTATED RINGERS IV BOLUS
1000.0000 mL | Freq: Once | INTRAVENOUS | Status: AC
  Administered 2020-04-09: 1000 mL via INTRAVENOUS

## 2020-04-09 MED ORDER — LACTATED RINGERS IV SOLN: INTRAVENOUS | Status: DC

## 2020-04-09 MED ORDER — GLYCOPYRROLATE 0.2 MG/ML IJ SOLN
0.1000 mg | Freq: Once | INTRAMUSCULAR | Status: AC
  Administered 2020-04-09: 0.1 mg via INTRAVENOUS
  Filled 2020-04-09: qty 1

## 2020-04-09 MED ORDER — PROMETHAZINE HCL 25 MG PO TABS: 25.0000 mg | ORAL_TABLET | Freq: Four times a day (QID) | ORAL | 2 refills | Status: DC | PRN

## 2020-04-09 MED ORDER — GLYCOPYRROLATE 2 MG PO TABS: 2.0000 mg | ORAL_TABLET | Freq: Three times a day (TID) | ORAL | 3 refills | Status: DC | PRN

## 2020-04-09 MED ORDER — POTASSIUM CHLORIDE CRYS ER 20 MEQ PO TBCR: 40.0000 meq | EXTENDED_RELEASE_TABLET | Freq: Two times a day (BID) | ORAL | 3 refills | Status: DC

## 2020-04-09 MED ORDER — SODIUM CHLORIDE 0.9 % IV SOLN
25.0000 mg | Freq: Once | INTRAVENOUS | Status: AC
  Administered 2020-04-09: 25 mg via INTRAVENOUS
  Filled 2020-04-09: qty 1

## 2020-04-09 MED ORDER — PANTOPRAZOLE SODIUM 40 MG IV SOLR
40.0000 mg | Freq: Once | INTRAVENOUS | Status: AC
  Administered 2020-04-09: 40 mg via INTRAVENOUS
  Filled 2020-04-09: qty 40

## 2020-04-09 NOTE — MAU Note (Signed)
Presents with c/o N/V x1 week.  Reports unable to keep anything down, including liquids.  States now having weakness and dizziness.  Denies VB or LOF.  Endorses +FM.

## 2020-04-09 NOTE — MAU Note (Signed)
IV lR #2 hung at 1250, not 1150

## 2020-04-09 NOTE — MAU Provider Note (Addendum)
History  Arrival date and time: 04/09/20 1027   Chief Complaint  Patient presents with  . Emesis  . Nausea   HPI Misty Murillo is a 30 y.o. at [redacted]w[redacted]d with di/di twins by early ultrasound and a PMHx notable for second trimester COVID-19 pneumonia with acute hypoxic respiratory failure, hypokalemia, Graves' dx, and a 5x5 fibroid who presents for unremitting N/V. This has been progressively worsening over pregnancy. Over the past week she has begun vomiting with all oral intake with increased intolerance to liquids. She endorses worsening reflux. She often wakes up in the middle of the night to vomit. She describes the vomitus as yellow/green/brown and foamy and tasting of sour milk. She takes Protonix (discontinued Pepcid) and Zofran for management. She is experiencing symptoms of dehydration such as dry mouth, weakness, dizziness, and dark urine. She denies VB, LOF, ctx, and +FM.  Review of discharge summary from last admission on 02/14/20-02/22/20 for COVID-19: pt discharged stable to finish steroid course Review of discharge summary from 02/03/19 for diarrhea/vomiting during pregnancy: first presentation of hypokalemia  --/--/O POS (01/30 0725)  OB History    Gravida  3   Para  1   Term  1   Preterm  0   AB  1   Living  1     SAB  0   IAB  1   Ectopic  0   Multiple  0   Live Births  1           Past Medical History:  Diagnosis Date  . Anxiety   . Depression    denies  . Fibroid   . Graves disease   . Headache   . Liver mass   . Migraines   . PID (acute pelvic inflammatory disease) 07/2019    Past Surgical History:  Procedure Laterality Date  . CHOLECYSTECTOMY    . RESECTION LIVER      Family History  Problem Relation Age of Onset  . Diabetes Mother   . Hyperthyroidism Mother   . Migraines Father   . Hypertension Father   . Cancer Other   . Hyperthyroidism Brother   . Hypertension Brother   . Cancer Maternal Grandmother   . Cancer Maternal  Grandfather     Social History   Socioeconomic History  . Marital status: Single    Spouse name: Not on file  . Number of children: Not on file  . Years of education: Not on file  . Highest education level: Not on file  Occupational History  . Not on file  Tobacco Use  . Smoking status: Former Smoker    Packs/day: 0.00    Types: Cigars    Quit date: 01/27/2018    Years since quitting: 2.2  . Smokeless tobacco: Never Used  Vaping Use  . Vaping Use: Never used  Substance and Sexual Activity  . Alcohol use: Not Currently    Comment: occasionally  . Drug use: Not Currently    Types: Marijuana    Comment: last used mid October  . Sexual activity: Not Currently    Birth control/protection: None  Other Topics Concern  . Not on file  Social History Narrative  . Not on file   Social Determinants of Health   Financial Resource Strain: Not on file  Food Insecurity: No Food Insecurity  . Worried About Charity fundraiser in the Last Year: Never true  . Ran Out of Food in the Last Year: Never true  Transportation Needs: No Transportation Needs  . Lack of Transportation (Medical): No  . Lack of Transportation (Non-Medical): No  Physical Activity: Not on file  Stress: Not on file  Social Connections: Not on file  Intimate Partner Violence: Not on file    No Known Allergies  No current facility-administered medications on file prior to encounter.   Current Outpatient Medications on File Prior to Encounter  Medication Sig Dispense Refill  . acetaminophen (TYLENOL) 500 MG tablet Take 2 tablets (1,000 mg total) by mouth every 6 (six) hours as needed. 100 tablet 2  . aspirin EC 81 MG tablet Take 1 tablet (81 mg total) by mouth daily. Take after 12 weeks for prevention of preeclampsia later in pregnancy 300 tablet 2  . ondansetron (ZOFRAN) 4 MG tablet Take 1 tablet (4 mg total) by mouth every 6 (six) hours. 20 tablet 0  . pantoprazole (PROTONIX) 20 MG tablet Take 1 tablet (20 mg  total) by mouth daily. 90 tablet 3  . potassium chloride SA (KLOR-CON) 20 MEQ tablet Take 1 tablet (20 mEq total) by mouth 2 (two) times daily. 60 tablet 3  . Prenatal Vit-Fe Fumarate-FA (PRENATAL MULTIVITAMIN) TABS tablet Take 1 tablet by mouth daily at 12 noon.    . progesterone (PROMETRIUM) 200 MG capsule Place 1 capsule (200 mg total) vaginally at bedtime. 90 capsule 1  . Blood Pressure Monitoring DEVI 1 each by Does not apply route once a week. 1 each 0  . cyclobenzaprine (FLEXERIL) 10 MG tablet Take 1 tablet (10 mg total) by mouth 3 (three) times daily as needed for muscle spasms. (Patient not taking: No sig reported) 30 tablet 0  . Elastic Bandages & Supports (COMFORT FIT MATERNITY SUPP LG) MISC 1 application by Does not apply route daily. 1 each 0  . progesterone (PROMETRIUM) 200 MG capsule Place 200 mg vaginally at bedtime.       ROS Pertinent positives and negative per HPI, all others reviewed and negative  Physical Exam   BP 117/81   Pulse 96   Temp 97.9 F (36.6 C) (Oral)   Resp 19   Ht 5\' 3"  (1.6 m)   Wt 103.7 kg   LMP 09/25/2019   SpO2 100%   BMI 40.49 kg/m   Physical Exam Constitutional:      Appearance: Normal appearance. She is ill-appearing.  HENT:     Head: Normocephalic and atraumatic.  Cardiovascular:     Rate and Rhythm: Normal rate and regular rhythm.     Heart sounds: Normal heart sounds.  Pulmonary:     Effort: Pulmonary effort is normal.  Abdominal:     General: Abdomen is flat. There is no distension.     Palpations: Abdomen is soft. There is no mass.     Tenderness: There is no abdominal tenderness.     Hernia: No hernia is present.  Skin:    General: Skin is warm and dry.     Capillary Refill: Capillary refill takes less than 2 seconds.  Neurological:     General: No focal deficit present.     Mental Status: She is alert.  Psychiatric:        Mood and Affect: Mood normal.        Behavior: Behavior normal.        Thought Content: Thought  content normal.        Judgment: Judgment normal.    FHT Baby A: Baseline 140, moderate variability, +accels, no decels Toco: no ctx Baby  B: Baseline 135. Mod Variability. + accels, no decels.  Cat: I  Labs Results for orders placed or performed during the hospital encounter of 04/09/20 (from the past 24 hour(s))  Urinalysis, Routine w reflex microscopic Urine, Clean Catch     Status: Abnormal   Collection Time: 04/09/20 10:46 AM  Result Value Ref Range   Color, Urine YELLOW YELLOW   APPearance CLEAR CLEAR   Specific Gravity, Urine 1.025 1.005 - 1.030   pH 6.0 5.0 - 8.0   Glucose, UA NEGATIVE NEGATIVE mg/dL   Hgb urine dipstick NEGATIVE NEGATIVE   Bilirubin Urine SMALL (A) NEGATIVE   Ketones, ur >80 (A) NEGATIVE mg/dL   Protein, ur NEGATIVE NEGATIVE mg/dL   Nitrite NEGATIVE NEGATIVE   Leukocytes,Ua NEGATIVE NEGATIVE  Comprehensive metabolic panel     Status: Abnormal   Collection Time: 04/09/20 11:33 AM  Result Value Ref Range   Sodium 135 135 - 145 mmol/L   Potassium 2.9 (L) 3.5 - 5.1 mmol/L   Chloride 105 98 - 111 mmol/L   CO2 21 (L) 22 - 32 mmol/L   Glucose, Bld 77 70 - 99 mg/dL   BUN <5 (L) 6 - 20 mg/dL   Creatinine, Ser 0.48 0.44 - 1.00 mg/dL   Calcium 8.4 (L) 8.9 - 10.3 mg/dL   Total Protein 6.1 (L) 6.5 - 8.1 g/dL   Albumin 2.3 (L) 3.5 - 5.0 g/dL   AST 28 15 - 41 U/L   ALT 13 0 - 44 U/L   Alkaline Phosphatase 164 (H) 38 - 126 U/L   Total Bilirubin 0.6 0.3 - 1.2 mg/dL   GFR, Estimated >60 >60 mL/min   Anion gap 9 5 - 15  CBC     Status: Abnormal   Collection Time: 04/09/20 11:33 AM  Result Value Ref Range   WBC 7.9 4.0 - 10.5 K/uL   RBC 3.76 (L) 3.87 - 5.11 MIL/uL   Hemoglobin 10.1 (L) 12.0 - 15.0 g/dL   HCT 29.8 (L) 36.0 - 46.0 %   MCV 79.3 (L) 80.0 - 100.0 fL   MCH 26.9 26.0 - 34.0 pg   MCHC 33.9 30.0 - 36.0 g/dL   RDW 13.8 11.5 - 15.5 %   Platelets 362 150 - 400 K/uL   nRBC 0.0 0.0 - 0.2 %  Lipase, blood     Status: None   Collection Time: 04/09/20  11:33 AM  Result Value Ref Range   Lipase 20 11 - 51 U/L    Imaging No results found.  MAU Course  Procedures  Lab Orders     Urinalysis, Routine w reflex microscopic Urine, Clean Catch     Comprehensive metabolic panel     CBC     Lipase, blood Meds ordered this encounter  Medications  . lactated ringers bolus 1,000 mL  . promethazine (PHENERGAN) 25 mg in sodium chloride 0.9 % 50 mL IVPB  . pantoprazole (PROTONIX) injection 40 mg  . glycopyrrolate (ROBINUL) injection 0.1 mg  . lactated ringers infusion  . promethazine (PHENERGAN) 25 MG tablet    Sig: Take 1 tablet (25 mg total) by mouth every 6 (six) hours as needed for nausea or vomiting.    Dispense:  30 tablet    Refill:  2  . glycopyrrolate (ROBINUL) 2 MG tablet    Sig: Take 1 tablet (2 mg total) by mouth 3 (three) times daily as needed.    Dispense:  30 tablet    Refill:  3   Imaging  Orders  No imaging studies ordered today    Assessment and Plan  1. Sx of N/V IV Phenergan to alleviate sx in [facility]U. Phenergan and Robinul prescribed. Cautioned about side-effects of Phenergan and regimen for use with Zofran. 2. GERD in pregnancy Pt counseled about using elevation in bed to decrease nighttime emesis. Will continue using Protonix. 3. Symptomatic dehydration 1.2L of LR infused and pt ate cup of ice before discharge. Reported she is feeling better. Pt counseled to return to MAU with worsening sx of dehydration if cannot keep fluids down. 4. Worsened Hypokalemia Pt counseled to take 2 tablets (35mEq) twice a day. Pharmacy updated on dose increase. Recommend repeat labs at April prenatal appt.  #FWB FHT Cat 1 NST: reactive  Dyanne Carrel, MS3  Attestation of Supervision of Student:  I confirm that I have verified the information documented in the medical student's note and that I have also personally reperformed the history, physical exam and all medical decision making activities.  I have verified that all services  and findings are accurately documented in this student's note; and I agree with management and plan as outlined in the documentation. I have also made any necessary editorial changes.  30yo G3P1011 at 28 weeks with Di/Di twin pregnancy complicated with hyperemesis, hypokalemia, hyperthyroidism, COVID in earlier pregnancy. Having increased nausea and vomiting that started earlier today. Zofran not helpful. Has been vomiting for about 1 week. Emesis described as stomach contents and bilious. Symptoms are worsening. No palliating factors. No fevers, chills, abdominal pain. Good fetal movement. No leaking fluid.  Reviewed previous documentation from hospitalization.  BP 137/78   Pulse (!) 103   Temp 97.9 F (36.6 C) (Oral)   Resp 19   Ht 5\' 3"  (1.6 m)   Wt 103.7 kg   LMP 09/25/2019   SpO2 100%   BMI 40.49 kg/m  A&Ox3 RR, no murmurs Good resp effort. No rales. Abd nontender, nondistended. No edema. 2+ pulses.  Reviewed labs.  A/p [redacted] weeks gestation Di/Di twins Hypokalemic. Hyperemesis. Reactive NST  Improved after 2 L IVF, phenergan, robinul, IV protonix. Patient tolerated ice chips.  D/c to home on robinul and phenergan. Increase potassium supplementation.  [place] for [employer] [employer] 04/09/2020 2:19 PM

## 2020-04-09 NOTE — Discharge Instructions (Signed)
Hyperemesis Gravidarum Hyperemesis gravidarum is a severe form of nausea and vomiting that happens during pregnancy. Hyperemesis is worse than morning sickness. It may cause you to have nausea or vomiting all day for many days. It may keep you from eating and drinking enough food and liquids, which can lead to dehydration, malnutrition, and weight loss. Hyperemesis usually occurs during the first half (the first 20 weeks) of pregnancy. It often goes away once a woman is in her second half of pregnancy. However, sometimes hyperemesis continues through an entire pregnancy. What are the causes? The cause of this condition is not known. It may be associated with:  Changes in hormones in the body during pregnancy.  Changes in the gastrointestinal system.  Genetic or inherited conditions. What are the signs or symptoms? Symptoms of this condition include:  Severe nausea and vomiting that does not go away.  Problems keeping food down.  Weight loss.  Loss of body fluid (dehydration).  Loss of appetite. You may have no desire to eat or you may not like the food you have previously enjoyed. How is this diagnosed? This condition may be diagnosed based on your medical history, your symptoms, and a physical exam. You may also have other tests, including:  Blood tests.  Urine tests.  Blood pressure tests.  Ultrasound to look for problems with the placenta or to check if you are pregnant with more than one baby. How is this treated? This condition is managed by controlling symptoms. This may include:  Following an eating plan. This can help to lessen nausea and vomiting.  Treatments that do not use medicine. These include acupressure bracelets, hypnosis, and eating or drinking foods or fluids that contain ginger, ginger ale, or ginger tea.  Taking prescription medicine or over-the-counter medicine as told by your health care provider.  Continuing to take prenatal vitamins. You may need to  change what kind you take and when you take them. Follow your health care provider's instructions about prenatal vitamins. An eating plan and medicines are often used together to help control symptoms. If medicines do not help relieve nausea and vomiting, you may need to receive fluids through an IV at the hospital. Follow these instructions at home: To help relieve your symptoms, listen to your body. Everyone is different and has different preferences. Find what works best for you. Here are some things you can try to help relieve your symptoms: Meals and snacks  Eat 5-6 small meals daily instead of 3 large meals. Eating small meals and snacks can help you avoid an empty stomach.  Before getting out of bed, eat a couple of crackers to avoid moving around on an empty stomach.  Eat a protein-rich snack before bed. Examples include cheese and crackers, or a peanut butter sandwich made with 1 slice of whole-wheat bread and 1 tsp (5 g) of peanut butter.  Eat and drink slowly.  Try eating starchy foods as these are usually tolerated well. Examples include cereal, toast, bread, potatoes, pasta, rice, and pretzels.  Eat at least one serving of protein with your meals and snacks. Protein options include lean meats, poultry, seafood, beans, nuts, nut butters, eggs, cheese, and yogurt.  Eat or suck on things that have ginger in them. It may help to relieve nausea. Add  tsp (0.44 g) ground ginger to hot tea, or choose ginger tea.   Fluids It is important to stay hydrated. Try to:  Drink small amounts of fluids often.  Drink fluids 30 minutes  before or after a meal to help lessen the feeling of a full stomach.  Drink 100% fruit juice or an electrolyte drink. An electrolyte drink contains sodium, potassium, and chloride.  Drink fluids that are cold, clear, and carbonated or sour. These include lemonade, ginger ale, lemon-lime soda, ice water, and sparkling water. Things to avoid Avoid the  following:  Eating foods that trigger your symptoms. These may include spicy foods, coffee, high-fat foods, very sweet foods, and acidic foods.  Drinking more than 1 cup of fluid at a time.  Skipping meals. Nausea can be more intense on an empty stomach. If you cannot tolerate food, do not force it. Try sucking on ice chips or other frozen items and make up for missed calories later.  Lying down within 2 hours after eating.  Being exposed to environmental triggers. These may include food smells, smoky rooms, closed spaces, rooms with strong smells, warm or humid places, overly loud and noisy rooms, and rooms with motion or flickering lights. Try eating meals in a well-ventilated area that is free of strong smells.  Making quick and sudden changes in your movement.  Taking iron pills and multivitamins that contain iron. If you take prescription iron pills, do not stop taking them unless your health care provider approves.  Preparing food. The smell of food can spoil your appetite or trigger nausea. General instructions  Brush your teeth or use a mouth rinse after meals.  Take over-the-counter and prescription medicines only as told by your health care provider.  Follow instructions from your health care provider about eating or drinking restrictions.  Talk with your health care provider about starting a supplement of vitamin B6.  Continue to take your prenatal vitamins as told by your health care provider. If you are having trouble taking your prenatal vitamins, talk with your health care provider about other options.  Keep all follow-up visits. This is important. Follow-up visits include prenatal visits. Contact a health care provider if:  You have pain in your abdomen.  You have a severe headache.  You have vision problems.  You are losing weight.  You feel weak or dizzy.  You cannot eat or drink without vomiting, especially if this goes on for a full day. Get help right  away if:  You cannot drink fluids without vomiting.  You vomit blood.  You have constant nausea and vomiting.  You are very weak.  You faint.  You have a fever and your symptoms suddenly get worse. Summary  Hyperemesis gravidarum is a severe form of nausea and vomiting that happens during pregnancy.  Making some changes to your eating habits may help relieve nausea and vomiting.  This condition may be managed with lifestyle changes and medicines as prescribed by your health care provider.  If medicines do not help relieve nausea and vomiting, you may need to receive fluids through an IV at the hospital. This information is not intended to replace advice given to you by your health care provider. Make sure you discuss any questions you have with your health care provider. Document Revised: 08/01/2019 Document Reviewed: 08/01/2019 Elsevier Patient Education  2021 [employer].

## 2020-04-10 ENCOUNTER — Encounter: Payer: Self-pay | Admitting: Women's Health

## 2020-04-10 ENCOUNTER — Ambulatory Visit (INDEPENDENT_AMBULATORY_CARE_PROVIDER_SITE_OTHER): Payer: Medicaid Other

## 2020-04-10 ENCOUNTER — Other Ambulatory Visit: Payer: Medicaid Other

## 2020-04-10 ENCOUNTER — Ambulatory Visit (INDEPENDENT_AMBULATORY_CARE_PROVIDER_SITE_OTHER): Payer: Medicaid Other | Admitting: Women's Health

## 2020-04-10 VITALS — BP 127/77 | HR 87 | Wt 231.0 lb

## 2020-04-10 DIAGNOSIS — O26879 Cervical shortening, unspecified trimester: Secondary | ICD-10-CM

## 2020-04-10 DIAGNOSIS — O99210 Obesity complicating pregnancy, unspecified trimester: Secondary | ICD-10-CM

## 2020-04-10 DIAGNOSIS — Z3A28 28 weeks gestation of pregnancy: Secondary | ICD-10-CM

## 2020-04-10 DIAGNOSIS — O30043 Twin pregnancy, dichorionic/diamniotic, third trimester: Secondary | ICD-10-CM

## 2020-04-10 DIAGNOSIS — O30002 Twin pregnancy, unspecified number of placenta and unspecified number of amniotic sacs, second trimester: Secondary | ICD-10-CM

## 2020-04-10 DIAGNOSIS — O0990 Supervision of high risk pregnancy, unspecified, unspecified trimester: Secondary | ICD-10-CM

## 2020-04-10 DIAGNOSIS — E876 Hypokalemia: Secondary | ICD-10-CM

## 2020-04-10 DIAGNOSIS — O30042 Twin pregnancy, dichorionic/diamniotic, second trimester: Secondary | ICD-10-CM

## 2020-04-10 DIAGNOSIS — D219 Benign neoplasm of connective and other soft tissue, unspecified: Secondary | ICD-10-CM

## 2020-04-10 DIAGNOSIS — Z131 Encounter for screening for diabetes mellitus: Secondary | ICD-10-CM

## 2020-04-10 DIAGNOSIS — E0590 Thyrotoxicosis, unspecified without thyrotoxic crisis or storm: Secondary | ICD-10-CM

## 2020-04-10 DIAGNOSIS — O0993 Supervision of high risk pregnancy, unspecified, third trimester: Secondary | ICD-10-CM

## 2020-04-10 DIAGNOSIS — E0500 Thyrotoxicosis with diffuse goiter without thyrotoxic crisis or storm: Secondary | ICD-10-CM

## 2020-04-10 MED ORDER — ONDANSETRON 4 MG PO TBDP: 4.0000 mg | ORAL_TABLET | Freq: Three times a day (TID) | ORAL | 6 refills | Status: DC | PRN

## 2020-04-10 NOTE — Progress Notes (Signed)
HIGH-RISK PREGNANCY VISIT Patient name: Misty Murillo MRN [id number]  Date of birth: May 15, 1990 Chief Complaint:   Routine Prenatal Visit (PN2)  History of Present Illness:   Misty Murillo is a 30 y.o. G50P1011 female at [redacted]w[redacted]d with an Estimated Date of Delivery: 07/01/20 being seen today for ongoing management of a high-risk pregnancy complicated by Graves disease, multiple gestation di-di twins and short cervix.  Today she reports no complaints. Went to [facility]U yest for n/v, got IVF. Requests rx for zofran odt. Interested in [place].  Depression screen [facility] 2/9 04/10/2020 12/19/2019 12/12/2019 07/21/2018 03/05/2018  Decreased Interest 0 2 3 1 3   Down, Depressed, Hopeless 0 0 1 0 3  PHQ - 2 Score 0 2 4 1 6   Altered sleeping 0 0 3 0 3  Tired, decreased energy 0 2 3 3 3   Change in appetite 0 2 3 2 3   Feeling bad or failure about yourself  0 0 0 0 3  Trouble concentrating 0 0 0 0 0  Moving slowly or fidgety/restless 0 0 0 0 0  Suicidal thoughts 0 0 0 0 0  PHQ-9 Score 0 6 13 6 18   Difficult doing work/chores - - - Not difficult at all Somewhat difficult    Contractions: Irritability. Vag. Bleeding: None.  Movement: Present. denies leaking of fluid.  Review of Systems:   Pertinent items are noted in HPI Denies abnormal vaginal discharge w/ itching/odor/irritation, headaches, visual changes, shortness of breath, chest pain, abdominal pain, severe nausea/vomiting, or problems with urination or bowel movements unless otherwise stated above. Pertinent History Reviewed:  Reviewed past medical,surgical, social, obstetrical and family history.  Reviewed problem list, medications and allergies. Physical Assessment:   Vitals:   04/10/20 1139  BP: 127/77  Pulse: 87  Weight: 231 lb (104.8 kg)  Body mass index is 40.92 kg/m.           Physical Examination:   General appearance: alert, well appearing, and in no distress  Mental status: alert, oriented to person, place, and time  Skin: warm & dry    Extremities: Edema: Trace    Cardiovascular: normal heart rate noted  Respiratory: normal respiratory effort, no distress  Abdomen: gravid, soft, non-tender  Pelvic: Cervical exam deferred         Fetal Status: Fetal Heart Rate (bpm): 148/142   Movement: Present    Fetal Surveillance Testing today: [place] 28+2 wks,DI/DI twins,anterior fibroid 7.1 x 3.7 x 6.8 cm,normal ovaries,limited view of cx Baby A:breech right,posterior placenta,fhr 148 bpm,svp of fluid 5.5 cm,EFW 1183 g 33% [name]:breech/transverse left,anterior placenta gr 0,fhr 142 bpm,svp of fluid 5.4 cm,1169g 30%,discordance 1.2%  Chaperone: N/A    No results found for this or any previous visit (from the past 24 hour(s)).  Assessment & Plan:  High-risk pregnancy: G3P1011 at [redacted]w[redacted]d with an Estimated Date of Delivery: 07/01/20   1) Di-Di twins, stable, 1.2% discordance today  2) Graves disease, no meds, will see if lab can add on tsh to pn2 already done today  3) Short cervix> 3.4cm down to 2.3cm w/ pressure 2wks ago, continue prometrium  4) Wants BTL> reviewed risks/benefits, discussed high incidence regret <30yo, LARCs just as effective, consent signed today   Meds:  Meds ordered this encounter  Medications  . ondansetron (ZOFRAN ODT) 4 MG disintegrating tablet    Sig: Take 1 tablet (4 mg total) by mouth every 8 (eight) hours as needed for nausea or vomiting.    Dispense:  20 tablet  Refill:  6    Order Specific Question:   Supervising Provider    Answer:   Florian Buff [[id number]]    Labs/procedures today: U/S, PN2 and declined tdap  Treatment Plan:  U/S q4wks    2x/wk testing @ 36wks or weekly BPP    Deliver @ 38wks:____   Reviewed: Preterm labor symptoms and general obstetric precautions including but not limited to vaginal bleeding, contractions, leaking of fluid and fetal movement were reviewed in detail with the patient.  All questions were answered. Has home bp cuff.  Check bp weekly, let us know if >140/90.    Follow-up: Return in about 4 weeks (around 05/08/2020) for HROB, Twins, US:EFW, in person, MD or CNM, Sign BTL consent today.   No future appointments.  Orders Placed This Encounter  Procedures  . US OB Follow Up  . US OB Follow Up [place], [facility] 04/10/2020 12:23 PM

## 2020-04-10 NOTE — Patient Instructions (Signed)
Misty Murillo, I greatly value your feedback.  If you receive a survey following your visit with [place] today, we appreciate you taking the time to fill it out.  Thanks, Knute Neu, CNM, WHNP-BC   [facility] [place] at [facility] ([place], [place] [address]) Entrance C, located off of [address] parking  Go to [email or web address] to register for FREE online childbirth classes   Call the office [phone]) or go to [facility] if:  You begin to have strong, frequent contractions  Your water breaks.  Sometimes it is a big gush of fluid, sometimes it is just a trickle that keeps getting your panties wet or running down your legs  You have vaginal bleeding.  It is normal to have a small amount of spotting if your cervix was checked.   You don't feel your baby moving like normal.  If you don't, get you something to eat and drink and lay down and focus on feeling your baby move.  You should feel at least 10 movements in 2 hours.  If you don't, you should call the office or go to [facility].    Tdap Vaccine  It is recommended that you get the Tdap vaccine during the third trimester of EACH pregnancy to help protect your baby from getting pertussis (whooping cough)  27-36 weeks is the BEST time to do this so that you can pass the protection on to your baby. During pregnancy is better than after pregnancy, but if you are unable to get it during pregnancy it will be offered at the hospital.   You can get this vaccine with [place], at the health department, your family doctor, or some local pharmacies  Everyone who will be around your baby should also be up-to-date on their vaccines before the baby comes. Adults (who are not pregnant) only need 1 dose of Tdap during adulthood.   [place] Pediatricians/Family Doctors:  [facility] [phone]                 [place]  [phone] (usually not accepting new patients unless you have family there already, you are always welcome to call and ask)       [facility] Department [phone]       [facility] Pediatricians/Family Doctors:   [facility]: [phone]  [facility]: [phone]  [facility]: [place] Doctors:   [facility]: [facility]: [place]:  [place]: [phone]   Home Blood Pressure Monitoring for Patients   Your provider has recommended that you check your blood pressure (BP) at least once a week at home. If you do not have a blood pressure cuff at home, one will be provided for you. Contact your provider if you have not received your monitor within 1 week.   Helpful Tips for Accurate Home Blood Pressure Checks  . Don't smoke, exercise, or drink caffeine 30 minutes before checking your BP . Use the restroom before checking your BP (a full bladder can raise your pressure) . Relax in a comfortable upright chair . Feet on the ground . Left arm resting comfortably on a flat surface at the level of your heart . Legs uncrossed . Back supported . Sit quietly and don't talk . Place the cuff on your  bare arm . Adjust snuggly, so that only two fingertips can fit between your skin and the top of the cuff . Check 2 readings separated by at least one minute . Keep a log of your BP readings . For a visual, please reference this diagram: http://ccnc.care/bpdiagram  Provider Name: [facility]     Phone: [phone]  Zone 1: ALL CLEAR  Continue to monitor your symptoms:  . BP reading is less than 140 (top number) or less than 90 (bottom number)  . No right upper stomach pain . No headaches or seeing spots . No feeling nauseated or throwing up . No swelling in face and hands  Zone 2: CAUTION Call your  doctor's office for any of the following:  . BP reading is greater than 140 (top number) or greater than 90 (bottom number)  . Stomach pain under your ribs in the middle or right side . Headaches or seeing spots . Feeling nauseated or throwing up . Swelling in face and hands  Zone 3: EMERGENCY  Seek immediate medical care if you have any of the following:  . BP reading is greater than160 (top number) or greater than 110 (bottom number) . Severe headaches not improving with Tylenol . Serious difficulty catching your breath . Any worsening symptoms from Zone 2   Third Trimester of Pregnancy The third trimester is from week 29 through week 42, months 7 through 9. The third trimester is a time when the fetus is growing rapidly. At the end of the ninth month, the fetus is about 20 inches in length and weighs 6-10 pounds.  BODY CHANGES Your body goes through many changes during pregnancy. The changes vary from woman to woman.   Your weight will continue to increase. You can expect to gain 25-35 pounds (11-16 kg) by the end of the pregnancy.  You may begin to get stretch marks on your hips, abdomen, and breasts.  You may urinate more often because the fetus is moving lower into your pelvis and pressing on your bladder.  You may develop or continue to have heartburn as a result of your pregnancy.  You may develop constipation because certain hormones are causing the muscles that push waste through your intestines to slow down.  You may develop hemorrhoids or swollen, bulging veins (varicose veins).  You may have pelvic pain because of the weight gain and pregnancy hormones relaxing your joints between the bones in your pelvis. Backaches may result from overexertion of the muscles supporting your posture.  You may have changes in your hair. These can include thickening of your hair, rapid growth, and changes in texture. Some women also have hair loss during or after pregnancy, or hair that  feels dry or thin. Your hair will most likely return to normal after your baby is born.  Your breasts will continue to grow and be tender. A yellow discharge may leak from your breasts called colostrum.  Your belly button may stick out.  You may feel short of breath because of your expanding uterus.  You may notice the fetus "dropping," or moving lower in your abdomen.  You may have a bloody mucus discharge. This usually occurs a few days to a week before labor begins.  Your cervix becomes thin and soft (effaced) near your due date. WHAT TO EXPECT AT YOUR PRENATAL EXAMS  You will have prenatal exams every 2 weeks until week 36. Then, you will have weekly prenatal exams. During a routine prenatal visit:  You will be weighed to make sure you and the fetus are growing normally.  Your blood pressure is taken.  Your abdomen will be measured to track your baby's growth.  The fetal heartbeat will be listened to.  Any test results from the previous visit will be discussed.  You may have a cervical check near your due date to see if you have effaced. At around 36 weeks, your caregiver will check your cervix. At the same time, your caregiver will also perform a test on the secretions of the vaginal tissue. This test is to determine if a type of bacteria, Group B streptococcus, is present. Your caregiver will explain this further. Your caregiver may ask you:  What your birth plan is.  How you are feeling.  If you are feeling the baby move.  If you have had any abnormal symptoms, such as leaking fluid, bleeding, severe headaches, or abdominal cramping.  If you have any questions. Other tests or screenings that may be performed during your third trimester include:  Blood tests that check for low iron levels (anemia).  Fetal testing to check the health, activity level, and growth of the fetus. Testing is done if you have certain medical conditions or if there are problems during the  pregnancy. FALSE LABOR You may feel small, irregular contractions that eventually go away. These are called Braxton Hicks contractions, or false labor. Contractions may last for hours, days, or even weeks before true labor sets in. If contractions come at regular intervals, intensify, or become painful, it is best to be seen by your caregiver.  SIGNS OF LABOR   Menstrual-like cramps.  Contractions that are 5 minutes apart or less.  Contractions that start on the top of the uterus and spread down to the lower abdomen and back.  A sense of increased pelvic pressure or back pain.  A watery or bloody mucus discharge that comes from the vagina. If you have any of these signs before the 37th week of pregnancy, call your caregiver right away. You need to go to the hospital to get checked immediately. HOME CARE INSTRUCTIONS   Avoid all smoking, herbs, alcohol, and unprescribed drugs. These chemicals affect the formation and growth of the baby.  Follow your caregiver's instructions regarding medicine use. There are medicines that are either safe or unsafe to take during pregnancy.  Exercise only as directed by your caregiver. Experiencing uterine cramps is a good sign to stop exercising.  Continue to eat regular, healthy meals.  Wear a good support bra for breast tenderness.  Do not use hot tubs, steam rooms, or saunas.  Wear your seat belt at all times when driving.  Avoid raw meat, uncooked cheese, cat litter boxes, and soil used by cats. These carry germs that can cause birth defects in the baby.  Take your prenatal vitamins.  Try taking a stool softener (if your caregiver approves) if you develop constipation. Eat more high-fiber foods, such as fresh vegetables or fruit and whole grains. Drink plenty of fluids to keep your urine clear or pale yellow.  Take warm sitz baths to soothe any pain or discomfort caused by hemorrhoids. Use hemorrhoid cream if your caregiver approves.  If you  develop varicose veins, wear support hose. Elevate your feet for 15 minutes, 3-4 times a day. Limit salt in your diet.  Avoid heavy lifting, wear low heal shoes, and practice good posture.  Rest a lot with your legs elevated if you have leg cramps or low  back pain.  Visit your dentist if you have not gone during your pregnancy. Use a soft toothbrush to brush your teeth and be gentle when you floss.  A sexual relationship may be continued unless your caregiver directs you otherwise.  Do not travel far distances unless it is absolutely necessary and only with the approval of your caregiver.  Take prenatal classes to understand, practice, and ask questions about the labor and delivery.  Make a trial run to the hospital.  Pack your hospital bag.  Prepare the baby's nursery.  Continue to go to all your prenatal visits as directed by your caregiver. SEEK MEDICAL CARE IF:  You are unsure if you are in labor or if your water has broken.  You have dizziness.  You have mild pelvic cramps, pelvic pressure, or nagging pain in your abdominal area.  You have persistent nausea, vomiting, or diarrhea.  You have a bad smelling vaginal discharge.  You have pain with urination. SEEK IMMEDIATE MEDICAL CARE IF:   You have a fever.  You are leaking fluid from your vagina.  You have spotting or bleeding from your vagina.  You have severe abdominal cramping or pain.  You have rapid weight loss or gain.  You have shortness of breath with chest pain.  You notice sudden or extreme swelling of your face, hands, ankles, feet, or legs.  You have not felt your baby move in over an hour.  You have severe headaches that do not go away with medicine.  You have vision changes. Document Released: 12/31/2000 Document Revised: 01/11/2013 Document Reviewed: 03/09/2012 [facility] Patient Information 2015 [place], [place]. This information is not intended to replace advice given to you by your health  care provider. Make sure you discuss any questions you have with your health care provider.

## 2020-04-10 NOTE — Progress Notes (Addendum)
[place] 28+2 wks,DI/DI twins,anterior fibroid 7.1 x 3.7 x 6.8 cm,normal ovaries,limited view of cx Baby A:breech right,posterior placenta,fhr 148 bpm,svp of fluid 5.5 cm,EFW 1183 g 33% [name]:breech/transverse left,anterior placenta gr 0,fhr 142 bpm,svp of fluid 5.4 cm,1169g 30%,discordance 1.2%

## 2020-04-11 ENCOUNTER — Other Ambulatory Visit: Payer: Self-pay | Admitting: Women's Health

## 2020-04-11 LAB — RPR: RPR Ser Ql: NONREACTIVE

## 2020-04-11 LAB — CBC
Hematocrit: 28.8 % — ABNORMAL LOW (ref 34.0–46.6)
Hemoglobin: 9.6 g/dL — ABNORMAL LOW (ref 11.1–15.9)
MCH: 26.5 pg — ABNORMAL LOW (ref 26.6–33.0)
MCHC: 33.3 g/dL (ref 31.5–35.7)
MCV: 80 fL (ref 79–97)
Platelets: 321 10*3/uL (ref 150–450)
RBC: 3.62 x10E6/uL — ABNORMAL LOW (ref 3.77–5.28)
RDW: 14 % (ref 11.7–15.4)
WBC: 6.5 10*3/uL (ref 3.4–10.8)

## 2020-04-11 LAB — GLUCOSE TOLERANCE, 2 HOURS W/ 1HR
Glucose, 1 hour: 113 mg/dL (ref 65–179)
Glucose, 2 hour: 109 mg/dL (ref 65–152)
Glucose, Fasting: 81 mg/dL (ref 65–91)

## 2020-04-11 LAB — ANTIBODY SCREEN: Antibody Screen: NEGATIVE

## 2020-04-11 LAB — HIV ANTIBODY (ROUTINE TESTING W REFLEX): HIV Screen 4th Generation wRfx: NONREACTIVE

## 2020-04-11 MED ORDER — FERROUS SULFATE 325 (65 FE) MG PO TABS: 325.0000 mg | ORAL_TABLET | ORAL | 2 refills | Status: DC

## 2020-04-12 ENCOUNTER — Telehealth: Payer: Self-pay

## 2020-04-12 NOTE — Telephone Encounter (Signed)
Pt called stating that she went to the bathroom and thinks she lost her mucus plug but is unsure.

## 2020-04-12 NOTE — Telephone Encounter (Signed)
Called pt for symptom clarification. She stated that when she wiped this morning, she saw the progesterone capsule residue, as usual, but this time there was a slight yellowish tinge and some mucous-like substance with it. She denies any bleeding or spotting. She has been having Braxton Hicks contractions and babies are moving well. She denies any itching, burning, or odor. She was instructed to monitor it and if any of those symptoms occurred, she could make an appt to be seen as a nurse visit self swab. She was instructed to increase her fluid intake to help with the contractions. Pt confirmed understanding.

## 2020-04-20 LAB — SPECIMEN STATUS REPORT

## 2020-04-21 LAB — TSH: TSH: 1.15 u[IU]/mL (ref 0.450–4.500)

## 2020-04-21 LAB — SPECIMEN STATUS REPORT

## 2020-04-26 ENCOUNTER — Telehealth: Payer: Self-pay | Admitting: Advanced Practice Midwife

## 2020-04-26 ENCOUNTER — Other Ambulatory Visit: Payer: Self-pay | Admitting: Advanced Practice Midwife

## 2020-04-26 MED ORDER — MEDICAL COMPRESSION SOCKS MISC: 1 refills | Status: DC

## 2020-04-26 NOTE — Telephone Encounter (Signed)
Pt called to request Rx for compression socks to be sent to [facility], that way her insurance will cover  Please note on Rx strength of compression & diagnosis code  Please advise & notify pt

## 2020-04-26 NOTE — Progress Notes (Signed)
rx for compression socks

## 2020-05-03 ENCOUNTER — Telehealth: Payer: Self-pay | Admitting: Women's Health

## 2020-05-03 NOTE — Telephone Encounter (Signed)
"  I am sending this message because I have been noticing some vaginal irritation with an increase in discharge which is causing a lot of discomfort. It itches often on the inside only and feels raw. I did have intercourse with my partner for the first time in months since I've been pregnant and wonder if that has something to do with it. What can I do to help with this?" PER PATIENT mychart message.

## 2020-05-08 ENCOUNTER — Other Ambulatory Visit: Payer: Self-pay

## 2020-05-08 ENCOUNTER — Ambulatory Visit (INDEPENDENT_AMBULATORY_CARE_PROVIDER_SITE_OTHER): Payer: Medicaid Other

## 2020-05-08 ENCOUNTER — Ambulatory Visit (INDEPENDENT_AMBULATORY_CARE_PROVIDER_SITE_OTHER): Payer: Medicaid Other | Admitting: Obstetrics & Gynecology

## 2020-05-08 ENCOUNTER — Encounter: Payer: Self-pay | Admitting: Obstetrics & Gynecology

## 2020-05-08 VITALS — BP 125/80 | HR 89 | Wt 242.5 lb

## 2020-05-08 DIAGNOSIS — E876 Hypokalemia: Secondary | ICD-10-CM

## 2020-05-08 DIAGNOSIS — Z23 Encounter for immunization: Secondary | ICD-10-CM | POA: Diagnosis not present

## 2020-05-08 DIAGNOSIS — O99210 Obesity complicating pregnancy, unspecified trimester: Secondary | ICD-10-CM

## 2020-05-08 DIAGNOSIS — O0990 Supervision of high risk pregnancy, unspecified, unspecified trimester: Secondary | ICD-10-CM | POA: Diagnosis not present

## 2020-05-08 DIAGNOSIS — D219 Benign neoplasm of connective and other soft tissue, unspecified: Secondary | ICD-10-CM

## 2020-05-08 DIAGNOSIS — O30043 Twin pregnancy, dichorionic/diamniotic, third trimester: Secondary | ICD-10-CM | POA: Diagnosis not present

## 2020-05-08 DIAGNOSIS — Z3A32 32 weeks gestation of pregnancy: Secondary | ICD-10-CM

## 2020-05-08 DIAGNOSIS — O0993 Supervision of high risk pregnancy, unspecified, third trimester: Secondary | ICD-10-CM | POA: Diagnosis not present

## 2020-05-08 LAB — POCT URINALYSIS DIPSTICK OB
Blood, UA: NEGATIVE
Glucose, UA: NEGATIVE
Nitrite, UA: NEGATIVE
POC,PROTEIN,UA: NEGATIVE

## 2020-05-08 MED ORDER — PANTOPRAZOLE SODIUM 40 MG PO TBEC: 40.0000 mg | DELAYED_RELEASE_TABLET | Freq: Every day | ORAL | 11 refills | Status: DC

## 2020-05-08 NOTE — Progress Notes (Signed)
[place] DI/DI twins 32+2 wks,anterior fibroid 6.9 x 4.8 x 6.1 cm BABY A:cephalic,posterior placenta gr 2,SVP of fluid 5.8 cm,fhr 148 bpm,EFW 1890 g 32% [name]:transverse head left,anterior placenta gr 2,SVP of fluid 7 cm,fhr 132 bpm,EFW 1854 g 28%,discordance 1.9%

## 2020-05-08 NOTE — Progress Notes (Signed)
HIGH-RISK PREGNANCY VISIT Patient name: Misty Murillo MRN [id number]  Date of birth: [birth year] Chief Complaint:   High Risk Gestation ([place] today; + swelling)  History of Present Illness:   Misty Murillo is a 30 y.o. G84P1011 female at [redacted]w[redacted]d with an Estimated Date of Delivery: 07/01/20 being seen today for ongoing management of a high-risk pregnancy complicated by DiDi twins.  Today she reports GERD with vomiting.  Depression screen [facility] 2/9 04/10/2020 12/19/2019 12/12/2019 07/21/2018 03/05/2018  Decreased Interest 0 2 3 1 3   Down, Depressed, Hopeless 0 0 1 0 3  PHQ - 2 Score 0 2 4 1 6   Altered sleeping 0 0 3 0 3  Tired, decreased energy 0 2 3 3 3   Change in appetite 0 2 3 2 3   Feeling bad or failure about yourself  0 0 0 0 3  Trouble concentrating 0 0 0 0 0  Moving slowly or fidgety/restless 0 0 0 0 0  Suicidal thoughts 0 0 0 0 0  PHQ-9 Score 0 6 13 6 18   Difficult doing work/chores - - - Not difficult at all Somewhat difficult    Contractions: Irregular. Vag. Bleeding: None.  Movement: Present. denies leaking of fluid.  Review of Systems:   Pertinent items are noted in HPI Denies abnormal vaginal discharge w/ itching/odor/irritation, headaches, visual changes, shortness of breath, chest pain, abdominal pain, severe nausea/vomiting, or problems with urination or bowel movements unless otherwise stated above. Pertinent History Reviewed:  Reviewed past medical,surgical, social, obstetrical and family history.  Reviewed problem list, medications and allergies. Physical Assessment:   Vitals:   05/08/20 1007  BP: 125/80  Pulse: 89  Weight: 242 lb 8 oz (110 kg)  Body mass index is 42.96 kg/m.           Physical Examination:   General appearance: alert, well appearing, and in no distress  Mental status: alert, oriented to person, place, and time  Skin: warm & dry   Extremities: Edema: Mild pitting, slight indentation    Cardiovascular: normal heart rate noted  Respiratory: normal  respiratory effort, no distress  Abdomen: gravid, soft, non-tender  Pelvic: Cervical exam deferred         Fetal Status:     Movement: Present    Fetal Surveillance Testing today: sonogram is normal with good symmetrical growth   Chaperone: N/A    Results for orders placed or performed in visit on 05/08/20 (from the past 24 hour(s))  POC Urinalysis Dipstick OB   Collection Time: 05/08/20 10:08 AM  Result Value Ref Range   Color, UA     Clarity, UA     Glucose, UA Negative Negative   Bilirubin, UA     Ketones, UA 3+    Spec Grav, UA     Blood, UA neg    pH, UA     POC,PROTEIN,UA Negative Negative, Trace, Small (1+), Moderate (2+), Large (3+), 4+   Urobilinogen, UA     Nitrite, UA neg    Leukocytes, UA Trace (A) Negative   Appearance     Odor      Assessment & Plan:  High-risk pregnancy: G3P1011 at [redacted]w[redacted]d with an Estimated Date of Delivery: 07/01/20   1) DiDi twins, stable, appropraite symmetrical interval growth Twin A vertex, B transverse  2) Graves disease stable,   Meds:  Meds ordered this encounter  Medications  . pantoprazole (PROTONIX) 40 MG tablet    Sig: Take 1 tablet (40 mg total) by  mouth daily.    Dispense:  30 tablet    Refill:  11    Labs/procedures today: U/S  Treatment Plan:  Routine care for twins  Reviewed: Preterm labor symptoms and general obstetric precautions including but not limited to vaginal bleeding, contractions, leaking of fluid and fetal movement were reviewed in detail with the patient.  All questions were answered. Does have home bp cuff. Office bp cuff given: not applicable. Check bp daily, let us know if consistently >140 and/or >90.  Follow-up: Return in about 2 weeks (around 05/22/2020) for [place].   No future appointments.  Orders Placed This Encounter  Procedures  . Tdap vaccine greater than or equal to 7yo IM  . POC Urinalysis Dipstick OB   Florian Buff  05/08/2020 10:19 AM

## 2020-05-17 ENCOUNTER — Telehealth: Payer: Self-pay | Admitting: Obstetrics & Gynecology

## 2020-05-17 NOTE — Telephone Encounter (Signed)
Patient reported swelling in legs, feet are painful, and numbness & tingling in shoulders. Tish followed up with patient.

## 2020-05-17 NOTE — Telephone Encounter (Signed)
Patient states she is having increased swelling in her legs up to her thighs despite wearing compression hose, soaking in warm baths with epsom salt and elevating extremities when possible.  Blood pressures normal at home, denies any other symptoms. Informed diuretics were not typically prescribed during pregnancy and since she is pregnant with twins, she may experience more swelling than others despite doing all the recommendations. Encouraged patient to continue all of the above mentioned and push extra fluids as it has been warmer recently which also may be contributing to some of her edema. Pt verbalized understanding and all questions answered.

## 2020-05-22 ENCOUNTER — Inpatient Hospital Stay (HOSPITAL_COMMUNITY)
Admission: AD | Admit: 2020-05-22 | Discharge: 2020-05-22 | Disposition: A | Discharge status: Home / Self Care | Payer: Medicaid Other | Attending: Obstetrics & Gynecology | Admitting: Obstetrics & Gynecology

## 2020-05-22 ENCOUNTER — Telehealth: Payer: Self-pay | Admitting: *Deleted

## 2020-05-22 ENCOUNTER — Other Ambulatory Visit: Payer: Self-pay

## 2020-05-22 ENCOUNTER — Encounter: Payer: Self-pay | Admitting: Obstetrics & Gynecology

## 2020-05-22 ENCOUNTER — Ambulatory Visit (INDEPENDENT_AMBULATORY_CARE_PROVIDER_SITE_OTHER): Payer: Medicaid Other | Admitting: Obstetrics & Gynecology

## 2020-05-22 ENCOUNTER — Encounter (HOSPITAL_COMMUNITY): Payer: Self-pay | Admitting: Obstetrics & Gynecology

## 2020-05-22 VITALS — BP 132/89 | HR 106 | Wt 246.0 lb

## 2020-05-22 DIAGNOSIS — Z7982 Long term (current) use of aspirin: Secondary | ICD-10-CM | POA: Diagnosis not present

## 2020-05-22 DIAGNOSIS — Z8249 Family history of ischemic heart disease and other diseases of the circulatory system: Secondary | ICD-10-CM | POA: Insufficient documentation

## 2020-05-22 DIAGNOSIS — Z3A34 34 weeks gestation of pregnancy: Secondary | ICD-10-CM

## 2020-05-22 DIAGNOSIS — O0990 Supervision of high risk pregnancy, unspecified, unspecified trimester: Secondary | ICD-10-CM

## 2020-05-22 DIAGNOSIS — O30043 Twin pregnancy, dichorionic/diamniotic, third trimester: Secondary | ICD-10-CM

## 2020-05-22 DIAGNOSIS — Z79899 Other long term (current) drug therapy: Secondary | ICD-10-CM | POA: Insufficient documentation

## 2020-05-22 DIAGNOSIS — O133 Gestational [pregnancy-induced] hypertension without significant proteinuria, third trimester: Secondary | ICD-10-CM | POA: Diagnosis present

## 2020-05-22 DIAGNOSIS — Z87891 Personal history of nicotine dependence: Secondary | ICD-10-CM | POA: Insufficient documentation

## 2020-05-22 DIAGNOSIS — O30042 Twin pregnancy, dichorionic/diamniotic, second trimester: Secondary | ICD-10-CM

## 2020-05-22 LAB — CBC
HCT: 27.5 % — ABNORMAL LOW (ref 36.0–46.0)
Hemoglobin: 9.1 g/dL — ABNORMAL LOW (ref 12.0–15.0)
MCH: 23.7 pg — ABNORMAL LOW (ref 26.0–34.0)
MCHC: 33.1 g/dL (ref 30.0–36.0)
MCV: 71.6 fL — ABNORMAL LOW (ref 80.0–100.0)
Platelets: 264 10*3/uL (ref 150–400)
RBC: 3.84 MIL/uL — ABNORMAL LOW (ref 3.87–5.11)
RDW: 15.9 % — ABNORMAL HIGH (ref 11.5–15.5)
WBC: 7.6 10*3/uL (ref 4.0–10.5)
nRBC: 0 % (ref 0.0–0.2)

## 2020-05-22 LAB — POCT URINALYSIS DIPSTICK OB
Blood, UA: NEGATIVE
Glucose, UA: NEGATIVE
Ketones, UA: NEGATIVE
Leukocytes, UA: NEGATIVE
Nitrite, UA: NEGATIVE
POC,PROTEIN,UA: NEGATIVE

## 2020-05-22 LAB — URINALYSIS, ROUTINE W REFLEX MICROSCOPIC
Bilirubin Urine: NEGATIVE
Glucose, UA: NEGATIVE mg/dL
Hgb urine dipstick: NEGATIVE
Ketones, ur: NEGATIVE mg/dL
Leukocytes,Ua: NEGATIVE
Nitrite: NEGATIVE
Protein, ur: NEGATIVE mg/dL
Specific Gravity, Urine: 1.012 (ref 1.005–1.030)
pH: 7 (ref 5.0–8.0)

## 2020-05-22 LAB — COMPREHENSIVE METABOLIC PANEL
ALT: 21 U/L (ref 0–44)
AST: 29 U/L (ref 15–41)
Albumin: 2.2 g/dL — ABNORMAL LOW (ref 3.5–5.0)
Alkaline Phosphatase: 239 U/L — ABNORMAL HIGH (ref 38–126)
Anion gap: 8 (ref 5–15)
BUN: <5 mg/dL — ABNORMAL LOW (ref 6–20)
CO2: 22 mmol/L (ref 22–32)
Calcium: 9.1 mg/dL (ref 8.9–10.3)
Chloride: 104 mmol/L (ref 98–111)
Creatinine, Ser: 0.65 mg/dL (ref 0.44–1.00)
GFR, Estimated: >60 mL/min (ref >60)
Glucose, Bld: 88 mg/dL (ref 70–99)
Potassium: 3.3 mmol/L — ABNORMAL LOW (ref 3.5–5.1)
Sodium: 134 mmol/L — ABNORMAL LOW (ref 135–145)
Total Bilirubin: 0.5 mg/dL (ref 0.3–1.2)
Total Protein: 5.7 g/dL — ABNORMAL LOW (ref 6.5–8.1)

## 2020-05-22 LAB — PROTEIN / CREATININE RATIO, URINE
Creatinine, Urine: 131.94 mg/dL
Protein Creatinine Ratio: 0.05 mg/mg{Cre} (ref 0.00–0.15)
Total Protein, Urine: 7 mg/dL

## 2020-05-22 NOTE — MAU Note (Signed)
Misty Murillo is a 30 y.o. at [redacted]w[redacted]d here in MAU reporting: was at her appointment today and then went home and took a nap. After her nap her BP was 156/93 and 147/94 at home. Has been having tingling in hands, feet, and arms. Also feeling lightheaded. Intermittent blurry peripheral vision. Had a headache earlier today but none at this time. Having sharp abdominal pains. No bleeding or LOF. +FM  Onset of complaint: today  Pain score: 6/10  Vitals:   05/22/20 1722  BP: 133/89  Pulse: 90  Resp: 18  Temp: 97.9 F (36.6 C)  SpO2: 98%     FHT: Baby A-138 [name]-158  Lab orders placed from triage: UA

## 2020-05-22 NOTE — Discharge Instructions (Signed)
Hypertension During Pregnancy High blood pressure (hypertension) is when the force of blood pumping through the arteries is high enough to cause problems with your health. Arteries are blood vessels that carry blood from the heart throughout the body. Hypertension during pregnancy can cause problems for you and your baby. It can be mild or severe. There are different types of hypertension that can happen during pregnancy. These include:  Chronic hypertension. This happens when you had high blood pressure before you became pregnant, and it continues during the pregnancy. Hypertension that develops before you are [redacted] weeks pregnant and continues during the pregnancy is also called chronic hypertension. If you have chronic hypertension, it will not go away after you have your baby. You will need follow-up visits with your health care provider after you have your baby. Your health care provider may want you to keep taking medicine for your blood pressure.  Gestational hypertension. This is hypertension that develops after the 20th week of pregnancy. Gestational hypertension usually goes away after you have your baby, but your health care provider will need to monitor your blood pressure to make sure that it is getting better.  Postpartum hypertension. This is high blood pressure that was present before delivery and continues after delivery or that starts after delivery. This usually occurs within 48 hours after childbirth but may occur up to 6 weeks after giving birth. When hypertension during pregnancy is severe, it is a medical emergency that requires treatment right away. How does this affect me? Women who have hypertension during pregnancy have a greater chance of developing hypertension later in life or during future pregnancies. In some cases, hypertension during pregnancy can cause serious complications, such as:  Stroke.  Heart attack.  Injury to other organs, such as kidneys, lungs, or  liver.  Preeclampsia.  A condition called hemolysis, elevated liver enzymes, and low platelet count (HELLP) syndrome.  Convulsions or seizures.  Placental abruption. How does this affect my baby? Hypertension during pregnancy can affect your baby. Your baby may:  Be born early (prematurely).  Not weigh as much as he or she should at birth (low birth weight).  Not tolerate labor well, leading to an unplanned cesarean delivery. This condition may also result in a baby's death before birth (stillbirth). What are the risks? There are certain factors that make it more likely for you to develop hypertension during pregnancy. These include:  Having hypertension during a previous pregnancy or a family history of hypertension.  Being overweight.  Being age 35 or older.  Being pregnant for the first time.  Being pregnant with more than one baby.  Becoming pregnant using fertilization methods, such as IVF (in vitro fertilization).  Having other medical problems, such as diabetes, kidney disease, or lupus. What can I do to lower my risk? The exact cause of hypertension during pregnancy is not known. You may be able to lower your risk by:  Maintaining a healthy weight.  Eating a healthy and balanced diet.  Following your health care provider's instructions about treating any long-term conditions that you had before becoming pregnant. It is very important to keep all of your prenatal care appointments. Your health care provider will check your blood pressure and make sure that your pregnancy is progressing as expected. If a problem is found, early treatment can prevent complications.   How is this treated? Treatment for hypertension during pregnancy varies depending on the type of hypertension you have and how serious it is.  If you were   taking medicine for high blood pressure before you became pregnant, talk with your health care provider. You may need to change medicine during  pregnancy because some medicines, like ACE inhibitors, may not be considered safe for your baby.  If you have gestational hypertension, your health care provider may order medicine to treat this during pregnancy.  If you are at risk for preeclampsia, your health care provider may recommend that you take a low-dose aspirin during your pregnancy.  If you have severe hypertension, you may need to be hospitalized so you and your baby can be monitored closely. You may also need to be given medicine to lower your blood pressure.  In some cases, if your condition gets worse, you may need to deliver your baby early. Follow these instructions at home: Eating and drinking  Drink enough fluid to keep your urine pale yellow.  Avoid caffeine.   Lifestyle  Do not use any products that contain nicotine or tobacco. These products include cigarettes, chewing tobacco, and vaping devices, such as e-cigarettes. If you need help quitting, ask your health care provider.  Do not use alcohol or drugs.  Avoid stress as much as possible.  Rest and get plenty of sleep.  Regular exercise can help to reduce your blood pressure. Ask your health care provider what kinds of exercise are best for you. General instructions  Take over-the-counter and prescription medicines only as told by your health care provider.  Keep all prenatal and follow-up visits. This is important. Contact a health care provider if:  You have symptoms that your health care provider told you may require more treatment or monitoring, such as: ? Headaches. ? Nausea or vomiting. ? Abdominal pain. ? Dizziness. ? Light-headedness. Get help right away if:  You have symptoms of serious complications, such as: ? Severe abdominal pain that does not get better with treatment. ? A severe headache that does not get better, blurred vision, or double vision. ? Vomiting that does not get better. ? Sudden, rapid weight gain or swelling in your  hands, ankles, or face. ? Vaginal bleeding. ? Blood in your urine. ? Shortness of breath or chest pain. ? Weakness on one side of your body or difficulty speaking.  Your baby is not moving as much as usual. These symptoms may represent a serious problem that is an emergency. Do not wait to see if the symptoms will go away. Get medical help right away. Call your local emergency services (911 in the [place] Do not drive yourself to the hospital. Summary  Hypertension during pregnancy can cause problems for you and your baby.  Treatment for hypertension during pregnancy varies depending on the type of hypertension you have and how serious it is.  Keep all prenatal and follow-up visits. This is important.  Get help right away if you have symptoms of serious complications related to high blood pressure. This information is not intended to replace advice given to you by your health care provider. Make sure you discuss any questions you have with your health care provider. Document Revised: 09/29/2019 Document Reviewed: 09/29/2019 Elsevier Patient Education  2021 [employer]  

## 2020-05-22 NOTE — [facility] Provider Note (Signed)
History     CSN: [id number]  Arrival date and time: 05/22/20 1706   Event Date/Time   First Provider Initiated Contact with Patient 05/22/20 1752      Chief Complaint  Patient presents with  . Abdominal Pain  . Hypertension   HPI Misty Murillo is a 30 y.o. G3P1011 at [redacted]w[redacted]d with di/di twins who presents for hypertension evaluation. Had elevated BP in the office this morning during her [facility] visit. After going home she developed a headache. Called the office and was instructed to come to [facility]U for evaluation. Since then her headache has resolved without intervention.  Denies history of hypertension. Denies visual disturbance or epigastric pain. Has noticed increase in lower extremity swelling recently. Reports good fetal movement x 2.   OB History    Gravida  3   Para  1   Term  1   Preterm  0   AB  1   Living  1     SAB  0   IAB  1   Ectopic  0   Multiple  0   Live Births  1           Past Medical History:  Diagnosis Date  . Anxiety   . Depression    denies  . Fibroid   . Graves disease   . Headache   . Liver mass   . Migraines   . PID (acute pelvic inflammatory disease) 07/2019    Past Surgical History:  Procedure Laterality Date  . CHOLECYSTECTOMY    . RESECTION LIVER      Family History  Problem Relation Age of Onset  . Diabetes Mother   . Hyperthyroidism Mother   . Migraines Father   . Hypertension Father   . Cancer Other   . Hyperthyroidism Brother   . Hypertension Brother   . Cancer Maternal Grandmother   . Cancer Maternal Grandfather     Social History   Tobacco Use  . Smoking status: Former Smoker    Packs/day: 0.00    Types: Cigars    Quit date: 01/27/2018    Years since quitting: 2.3  . Smokeless tobacco: Never Used  Vaping Use  . Vaping Use: Never used  Substance Use Topics  . Alcohol use: Not Currently    Comment: occasionally  . Drug use: Not Currently    Types: Marijuana    Comment: last used mid October     Allergies: No Known Allergies  Medications Prior to Admission  Medication Sig Dispense Refill Last Dose  . acetaminophen (TYLENOL) 500 MG tablet Take 2 tablets (1,000 mg total) by mouth every 6 (six) hours as needed. 100 tablet 2 Past Month at Unknown time  . aspirin EC 81 MG tablet Take 1 tablet (81 mg total) by mouth daily. Take after 12 weeks for prevention of preeclampsia later in pregnancy 300 tablet 2 05/22/2020 at Unknown time  . Blood Pressure Monitoring DEVI 1 each by Does not apply route once a week. 1 each 0 05/22/2020 at Unknown time  . ferrous sulfate 325 (65 FE) MG tablet Take 1 tablet (325 mg total) by mouth every other day. 45 tablet 2 05/21/2020 at Unknown time  . ondansetron (ZOFRAN ODT) 4 MG disintegrating tablet Take 1 tablet (4 mg total) by mouth every 8 (eight) hours as needed for nausea or vomiting. 20 tablet 6 05/22/2020 at Unknown time  . pantoprazole (PROTONIX) 40 MG tablet Take 1 tablet (40 mg total) by mouth daily.  30 tablet 11 Past Week at Unknown time  . potassium chloride SA (KLOR-CON) 20 MEQ tablet Take 2 tablets (40 mEq total) by mouth 2 (two) times daily. 60 tablet 3 05/22/2020 at Unknown time  . Prenatal Vit-Fe Fumarate-FA (PRENATAL MULTIVITAMIN) TABS tablet Take 1 tablet by mouth daily at 12 noon.   05/22/2020 at Unknown time  . progesterone (PROMETRIUM) 200 MG capsule Place 1 capsule (200 mg total) vaginally at bedtime. 90 capsule 1 05/21/2020 at Unknown time  . Elastic Bandages & Supports (COMFORT FIT MATERNITY SUPP LG) MISC 1 application by Does not apply route daily. (Patient not taking: No sig reported) 1 each 0 Unknown at Unknown time  . Elastic Bandages & Supports (MEDICAL COMPRESSION SOCKS) MISC Wear daily as needed for lower leg swelling (Patient not taking: No sig reported) 2 each 1 Unknown at Unknown time  . glycopyrrolate (ROBINUL) 2 MG tablet Take 1 tablet (2 mg total) by mouth 3 (three) times daily as needed. (Patient not taking: No sig reported) 30 tablet 3      Review of Systems  Constitutional: Negative.   Eyes: Negative.   Cardiovascular: Positive for leg swelling.  Gastrointestinal: Negative.   Genitourinary: Negative.   Neurological: Positive for headaches (resolved).   Physical Exam   Blood pressure 139/90, pulse 91, temperature 97.9 F (36.6 C), temperature source Oral, resp. rate 18, last menstrual period 09/25/2019, SpO2 98 %, unknown if currently breastfeeding.  Patient Vitals for the past 24 hrs:  BP Temp Temp src Pulse Resp SpO2  05/22/20 1901 139/90 -- -- 91 -- --  05/22/20 1846 137/89 -- -- 91 -- --  05/22/20 1831 138/89 -- -- 88 -- --  05/22/20 1816 136/80 -- -- 93 -- --  05/22/20 1801 139/85 -- -- 91 -- --  05/22/20 1754 139/85 -- -- 91 -- --  05/22/20 1750 (!) 147/86 -- -- 87 -- --  05/22/20 1722 133/89 97.9 F (36.6 C) Oral 90 18 98 %    Physical Exam Vitals and nursing note reviewed.  Constitutional:      General: She is not in acute distress.    Appearance: She is well-developed.  HENT:     Head: Normocephalic and atraumatic.  Cardiovascular:     Rate and Rhythm: Normal rate and regular rhythm.     Heart sounds: Normal heart sounds.  Pulmonary:     Effort: Pulmonary effort is normal. No respiratory distress.     Breath sounds: Normal breath sounds. No wheezing.  Abdominal:     Palpations: Abdomen is soft.  Musculoskeletal:     Right lower leg: 2+ Pitting Edema present.     Left lower leg: 2+ Pitting Edema present.  Skin:    General: Skin is warm and dry.  Neurological:     Mental Status: She is alert.     Deep Tendon Reflexes:     Reflex Scores:      Patellar reflexes are 2+ on the right side and 2+ on the left side.    Comments: No clonus  Psychiatric:        Mood and Affect: Mood normal.        Behavior: Behavior normal.    Fetal Tracing: Baby A Baseline: 140 Variability: moderate Accelerations: 15x15 Decelerations: none  [name] Baseline: 145 Variability:  moderate Accelerations:15x15 Decelerations: none  Toco: some UI & irregular contractions  MAU Course  Procedures Results for orders placed or performed during the hospital encounter of 05/22/20 (from the past 24  hour(s))  CBC     Status: Abnormal   Collection Time: 05/22/20  5:48 PM  Result Value Ref Range   WBC 7.6 4.0 - 10.5 K/uL   RBC 3.84 (L) 3.87 - 5.11 MIL/uL   Hemoglobin 9.1 (L) 12.0 - 15.0 g/dL   HCT 27.5 (L) 36.0 - 46.0 %   MCV 71.6 (L) 80.0 - 100.0 fL   MCH 23.7 (L) 26.0 - 34.0 pg   MCHC 33.1 30.0 - 36.0 g/dL   RDW 15.9 (H) 11.5 - 15.5 %   Platelets 264 150 - 400 K/uL   nRBC 0.0 0.0 - 0.2 %  Comprehensive metabolic panel     Status: Abnormal   Collection Time: 05/22/20  5:48 PM  Result Value Ref Range   Sodium 134 (L) 135 - 145 mmol/L   Potassium 3.3 (L) 3.5 - 5.1 mmol/L   Chloride 104 98 - 111 mmol/L   CO2 22 22 - 32 mmol/L   Glucose, Bld 88 70 - 99 mg/dL   BUN <5 (L) 6 - 20 mg/dL   Creatinine, Ser 0.65 0.44 - 1.00 mg/dL   Calcium 9.1 8.9 - 10.3 mg/dL   Total Protein 5.7 (L) 6.5 - 8.1 g/dL   Albumin 2.2 (L) 3.5 - 5.0 g/dL   AST 29 15 - 41 U/L   ALT 21 0 - 44 U/L   Alkaline Phosphatase 239 (H) 38 - 126 U/L   Total Bilirubin 0.5 0.3 - 1.2 mg/dL   GFR, Estimated >60 >60 mL/min   Anion gap 8 5 - 15  Urinalysis, Routine w reflex microscopic     Status: None   Collection Time: 05/22/20  6:07 PM  Result Value Ref Range   Color, Urine YELLOW YELLOW   APPearance CLEAR CLEAR   Specific Gravity, Urine 1.012 1.005 - 1.030   pH 7.0 5.0 - 8.0   Glucose, UA NEGATIVE NEGATIVE mg/dL   Hgb urine dipstick NEGATIVE NEGATIVE   Bilirubin Urine NEGATIVE NEGATIVE   Ketones, ur NEGATIVE NEGATIVE mg/dL   Protein, ur NEGATIVE NEGATIVE mg/dL   Nitrite NEGATIVE NEGATIVE   Leukocytes,Ua NEGATIVE NEGATIVE  Protein / creatinine ratio, urine     Status: None   Collection Time: 05/22/20  6:07 PM  Result Value Ref Range   Creatinine, Urine 131.94 mg/dL   Total Protein, Urine 7  mg/dL   Protein Creatinine Ratio 0.05 0.00 - 0.15 mg/mg[Cre]    MDM Patient presents for evaluation of new onset hypertension. Had a h/a earlier today that resolved without intervention & is otherwise asymptomatic. Labile BPs in mau. No severe BPs. Preeclampsia labs normal.  Patient now has gestational hypertension diagnosis after elevated BP this morning in the office and this evening in MAU. She has close f/u with [facility] due to high risk pregnancy. Next appt is 5/10.  Reviewed with Dr. Elgie Congo. [place] for d/c home, keep next appointment in 1 week.   Assessment and Plan   1. Gestational hypertension, third trimester   2. Dichorionic diamniotic twin pregnancy in second trimester   3. [redacted] weeks gestation of pregnancy    -reviewed preeclampsia precautions & reasons to return to MAU -keep appointment with [place] 05/22/2020, 7:51 PM

## 2020-05-22 NOTE — Telephone Encounter (Signed)
Pt reports headache since leaving office today and home bp of 156/94 and repeat 147/92. Pt also has numbness and tingling. I discussed with Dr. Elonda Husky and he recommended that she go to MAU for further evaluation of the bp and for labs. Paitent agrees.

## 2020-05-22 NOTE — Progress Notes (Signed)
HIGH-RISK PREGNANCY VISIT Patient name: Misty Murillo MRN [id number]  Date of birth: 05/31/1990 Chief Complaint:   Routine Prenatal Visit  History of Present Illness:   Misty Murillo is a 30 y.o. G83P1011 female at [redacted]w[redacted]d with an Estimated Date of Delivery: 07/01/20 being seen today for ongoing management of a high-risk pregnancy complicated by multiple gestation DiDi twins.  Today she reports no complaints.  Depression screen [facility] 2/9 04/10/2020 12/19/2019 12/12/2019 07/21/2018 03/05/2018  Decreased Interest 0 2 3 1 3   Down, Depressed, Hopeless 0 0 1 0 3  PHQ - 2 Score 0 2 4 1 6   Altered sleeping 0 0 3 0 3  Tired, decreased energy 0 2 3 3 3   Change in appetite 0 2 3 2 3   Feeling bad or failure about yourself  0 0 0 0 3  Trouble concentrating 0 0 0 0 0  Moving slowly or fidgety/restless 0 0 0 0 0  Suicidal thoughts 0 0 0 0 0  PHQ-9 Score 0 6 13 6 18   Difficult doing work/chores - - - Not difficult at all Somewhat difficult    Contractions: Irregular. Vag. Bleeding: None.  Movement: Present. denies leaking of fluid.  Review of Systems:   Pertinent items are noted in HPI Denies abnormal vaginal discharge w/ itching/odor/irritation, headaches, visual changes, shortness of breath, chest pain, abdominal pain, severe nausea/vomiting, or problems with urination or bowel movements unless otherwise stated above. Pertinent History Reviewed:  Reviewed past medical,surgical, social, obstetrical and family history.  Reviewed problem list, medications and allergies. Physical Assessment:   Vitals:   05/22/20 0915 05/22/20 0920  BP: 140/89 132/89  Pulse: (!) 102 (!) 106  Weight: 246 lb (111.6 kg)   Body mass index is 43.58 kg/m.           Physical Examination:   General appearance: alert, well appearing, and in no distress  Mental status: alert, oriented to person, place, and time  Skin: warm & dry   Extremities: Edema: Trace    Cardiovascular: normal heart rate noted  Respiratory: normal  respiratory effort, no distress  Abdomen: gravid, soft, non-tender  Pelvic: Cervical exam deferred         Fetal Status: Fetal Heart Rate (bpm): 154/148 Fundal Height: 44 cm Movement: Present    Fetal Surveillance Testing today: FHR 154/148   Chaperone: N/A    Results for orders placed or performed in visit on 05/22/20 (from the past 24 hour(s))  POC Urinalysis Dipstick OB   Collection Time: 05/22/20  9:20 AM  Result Value Ref Range   Color, UA     Clarity, UA     Glucose, UA Negative Negative   Bilirubin, UA     Ketones, UA neg    Spec Grav, UA     Blood, UA neg    pH, UA     POC,PROTEIN,UA Negative Negative, Trace, Small (1+), Moderate (2+), Large (3+), 4+   Urobilinogen, UA     Nitrite, UA neg    Leukocytes, UA Negative Negative   Appearance     Odor      Assessment & Plan:  High-risk pregnancy: G3P1011 at [redacted]w[redacted]d with an Estimated Date of Delivery: 07/01/20   1) DiDi twins, stable  2) Borderline BP, short interval follow up  Meds: No orders of the defined types were placed in this encounter.   Labs/procedures today: none  Treatment Plan:  Short interval follow up for BP monitoring  Reviewed: Preterm labor symptoms and  general obstetric precautions including but not limited to vaginal bleeding, contractions, leaking of fluid and fetal movement were reviewed in detail with the patient.  All questions were answered. Does have home bp cuff. Office bp cuff given: not applicable. Check bp daily, let us know if consistently >140 and/or >90.  Follow-up: Return in about 1 week (around 05/29/2020) for [place], with Dr Elonda Husky.   No future appointments.  Orders Placed This Encounter  Procedures  . POC Urinalysis Dipstick OB   Florian Buff  05/22/2020 9:45 AM

## 2020-05-25 ENCOUNTER — Encounter: Payer: Self-pay | Admitting: *Deleted

## 2020-05-25 ENCOUNTER — Ambulatory Visit (INDEPENDENT_AMBULATORY_CARE_PROVIDER_SITE_OTHER): Payer: Medicaid Other | Admitting: *Deleted

## 2020-05-25 ENCOUNTER — Encounter (HOSPITAL_COMMUNITY): Payer: Self-pay | Admitting: Obstetrics & Gynecology

## 2020-05-25 ENCOUNTER — Inpatient Hospital Stay (HOSPITAL_COMMUNITY)
Admission: AD | Admit: 2020-05-25 | Discharge: 2020-05-25 | Disposition: A | Discharge status: Home / Self Care | Payer: Medicaid Other | Attending: Obstetrics & Gynecology | Admitting: Obstetrics & Gynecology

## 2020-05-25 ENCOUNTER — Telehealth: Payer: Self-pay

## 2020-05-25 ENCOUNTER — Other Ambulatory Visit: Payer: Self-pay

## 2020-05-25 DIAGNOSIS — Z7982 Long term (current) use of aspirin: Secondary | ICD-10-CM | POA: Diagnosis not present

## 2020-05-25 DIAGNOSIS — Z3A34 34 weeks gestation of pregnancy: Secondary | ICD-10-CM | POA: Insufficient documentation

## 2020-05-25 DIAGNOSIS — O30043 Twin pregnancy, dichorionic/diamniotic, third trimester: Secondary | ICD-10-CM

## 2020-05-25 DIAGNOSIS — O133 Gestational [pregnancy-induced] hypertension without significant proteinuria, third trimester: Secondary | ICD-10-CM

## 2020-05-25 DIAGNOSIS — E876 Hypokalemia: Secondary | ICD-10-CM

## 2020-05-25 DIAGNOSIS — O0990 Supervision of high risk pregnancy, unspecified, unspecified trimester: Secondary | ICD-10-CM

## 2020-05-25 DIAGNOSIS — D219 Benign neoplasm of connective and other soft tissue, unspecified: Secondary | ICD-10-CM

## 2020-05-25 DIAGNOSIS — Z79899 Other long term (current) drug therapy: Secondary | ICD-10-CM | POA: Insufficient documentation

## 2020-05-25 DIAGNOSIS — Z87891 Personal history of nicotine dependence: Secondary | ICD-10-CM | POA: Insufficient documentation

## 2020-05-25 DIAGNOSIS — O99210 Obesity complicating pregnancy, unspecified trimester: Secondary | ICD-10-CM

## 2020-05-25 LAB — COMPREHENSIVE METABOLIC PANEL
ALT: 22 U/L (ref 0–44)
AST: 35 U/L (ref 15–41)
Albumin: 2.2 g/dL — ABNORMAL LOW (ref 3.5–5.0)
Alkaline Phosphatase: 255 U/L — ABNORMAL HIGH (ref 38–126)
Anion gap: 6 (ref 5–15)
BUN: <5 mg/dL — ABNORMAL LOW (ref 6–20)
CO2: 23 mmol/L (ref 22–32)
Calcium: 8.4 mg/dL — ABNORMAL LOW (ref 8.9–10.3)
Chloride: 106 mmol/L (ref 98–111)
Creatinine, Ser: 0.62 mg/dL (ref 0.44–1.00)
GFR, Estimated: >60 mL/min (ref >60)
Glucose, Bld: 102 mg/dL — ABNORMAL HIGH (ref 70–99)
Potassium: 3.1 mmol/L — ABNORMAL LOW (ref 3.5–5.1)
Sodium: 135 mmol/L (ref 135–145)
Total Bilirubin: 0.1 mg/dL — ABNORMAL LOW (ref 0.3–1.2)
Total Protein: 5.7 g/dL — ABNORMAL LOW (ref 6.5–8.1)

## 2020-05-25 LAB — CBC
HCT: 28.3 % — ABNORMAL LOW (ref 36.0–46.0)
Hemoglobin: 9.3 g/dL — ABNORMAL LOW (ref 12.0–15.0)
MCH: 23.6 pg — ABNORMAL LOW (ref 26.0–34.0)
MCHC: 32.9 g/dL (ref 30.0–36.0)
MCV: 71.8 fL — ABNORMAL LOW (ref 80.0–100.0)
Platelets: 252 10*3/uL (ref 150–400)
RBC: 3.94 MIL/uL (ref 3.87–5.11)
RDW: 15.9 % — ABNORMAL HIGH (ref 11.5–15.5)
WBC: 7.1 10*3/uL (ref 4.0–10.5)
nRBC: 0 % (ref 0.0–0.2)

## 2020-05-25 LAB — PROTEIN / CREATININE RATIO, URINE
Creatinine, Urine: 108.52 mg/dL
Protein Creatinine Ratio: 0.06 mg/mg{Cre} (ref 0.00–0.15)
Total Protein, Urine: 7 mg/dL

## 2020-05-25 MED ORDER — FAMOTIDINE 20 MG PO TABS
40.0000 mg | ORAL_TABLET | Freq: Once | ORAL | Status: AC
  Administered 2020-05-25: 40 mg via ORAL
  Filled 2020-05-25: qty 2

## 2020-05-25 MED ORDER — ACETAMINOPHEN 500 MG PO TABS
1000.0000 mg | ORAL_TABLET | Freq: Once | ORAL | Status: AC
  Administered 2020-05-25: 1000 mg via ORAL
  Filled 2020-05-25: qty 2

## 2020-05-25 MED ORDER — BUTALBITAL-APAP-CAFFEINE 50-325-40 MG PO TABS: 2.0000 | ORAL_TABLET | Freq: Two times a day (BID) | ORAL | 0 refills | Status: DC | PRN

## 2020-05-25 MED ORDER — CYCLOBENZAPRINE HCL 5 MG PO TABS
10.0000 mg | ORAL_TABLET | Freq: Once | ORAL | Status: AC
  Administered 2020-05-25: 10 mg via ORAL
  Filled 2020-05-25: qty 2

## 2020-05-25 NOTE — MAU Provider Note (Signed)
History     CSN: [id number]  Arrival date and time: 05/25/20 0948   Event Date/Time   First Provider Initiated Contact with Patient 05/25/20 1039      Chief Complaint  Patient presents with  . Hypertension  . Headache   HPI Misty Murillo is a 30 y.o. G3P1011 at [redacted]w[redacted]d with di/di twins who presents with headache. Was diagnosed with gestational hypertension on Tuesday. Woke up this morning at 2 am with a headache. Reports left frontal headache that is constant. Rates pain 4/10 but increases to 8/10 with movement. Took 1 regular strength tylenol at 4 am this morning without relief. States she has had headaches like this before but it was several years ago. Denies visual disturbance or epigastric pain. Denies ob complaints. Good fetal movement x 2.    OB History    Gravida  3   Para  1   Term  1   Preterm  0   AB  1   Living  1     SAB  0   IAB  1   Ectopic  0   Multiple  0   Live Births  1           Past Medical History:  Diagnosis Date  . Anxiety   . Depression    denies  . Fibroid   . Graves disease   . Headache   . Liver mass   . Migraines   . PID (acute pelvic inflammatory disease) 07/2019    Past Surgical History:  Procedure Laterality Date  . CHOLECYSTECTOMY    . RESECTION LIVER      Family History  Problem Relation Age of Onset  . Diabetes Mother   . Hyperthyroidism Mother   . Migraines Father   . Hypertension Father   . Cancer Other   . Hyperthyroidism Brother   . Hypertension Brother   . Cancer Maternal Grandmother   . Cancer Maternal Grandfather     Social History   Tobacco Use  . Smoking status: Former Smoker    Packs/day: 0.00    Types: Cigars    Quit date: 01/27/2018    Years since quitting: 2.3  . Smokeless tobacco: Never Used  Vaping Use  . Vaping Use: Never used  Substance Use Topics  . Alcohol use: Not Currently    Comment: occasionally  . Drug use: Not Currently    Types: Marijuana    Comment: last used mid  October    Allergies: No Known Allergies  Medications Prior to Admission  Medication Sig Dispense Refill Last Dose  . acetaminophen (TYLENOL) 500 MG tablet Take 2 tablets (1,000 mg total) by mouth every 6 (six) hours as needed. 100 tablet 2 05/25/2020 at Unknown time  . aspirin EC 81 MG tablet Take 1 tablet (81 mg total) by mouth daily. Take after 12 weeks for prevention of preeclampsia later in pregnancy 300 tablet 2 Past Week at Unknown time  . Elastic Bandages & Supports (MEDICAL COMPRESSION SOCKS) MISC Wear daily as needed for lower leg swelling 2 each 1 05/25/2020 at Unknown time  . ferrous sulfate 325 (65 FE) MG tablet Take 1 tablet (325 mg total) by mouth every other day. 45 tablet 2 Past Week at Unknown time  . ondansetron (ZOFRAN ODT) 4 MG disintegrating tablet Take 1 tablet (4 mg total) by mouth every 8 (eight) hours as needed for nausea or vomiting. 20 tablet 6 05/25/2020 at Unknown time  . potassium chloride SA (  KLOR-CON) 20 MEQ tablet Take 2 tablets (40 mEq total) by mouth 2 (two) times daily. 60 tablet 3 05/25/2020 at Unknown time  . Prenatal Vit-Fe Fumarate-FA (PRENATAL MULTIVITAMIN) TABS tablet Take 1 tablet by mouth daily at 12 noon.   05/25/2020 at Unknown time  . progesterone (PROMETRIUM) 200 MG capsule Place 1 capsule (200 mg total) vaginally at bedtime. 90 capsule 1 05/24/2020 at Unknown time  . Blood Pressure Monitoring DEVI 1 each by Does not apply route once a week. 1 each 0   . Elastic Bandages & Supports (COMFORT FIT MATERNITY SUPP LG) MISC 1 application by Does not apply route daily. (Patient not taking: No sig reported) 1 each 0   . pantoprazole (PROTONIX) 40 MG tablet Take 1 tablet (40 mg total) by mouth daily. (Patient not taking: Reported on 05/25/2020) 30 tablet 11 Not Taking at Unknown time    Review of Systems  Constitutional: Negative.   Eyes: Negative for photophobia and visual disturbance.  Cardiovascular: Positive for leg swelling.  Gastrointestinal: Negative.    Genitourinary: Negative.   Neurological: Positive for headaches.   Physical Exam   Blood pressure 135/89, pulse 88, temperature 98.1 F (36.7 C), temperature source Oral, resp. rate 18, height 5\' 3"  (1.6 m), last menstrual period 09/25/2019, SpO2 100 %, unknown if currently breastfeeding.  Physical Exam Vitals and nursing note reviewed.  Constitutional:      General: She is not in acute distress.    Appearance: She is well-developed.  HENT:     Head: Normocephalic and atraumatic.  Cardiovascular:     Rate and Rhythm: Normal rate.     Heart sounds: Normal heart sounds.  Pulmonary:     Effort: Pulmonary effort is normal. No respiratory distress.     Breath sounds: Normal breath sounds.  Musculoskeletal:     Right lower leg: 2+ Edema present.     Left lower leg: 2+ Edema present.  Skin:    General: Skin is warm and dry.  Neurological:     Mental Status: She is alert.     Deep Tendon Reflexes: Reflexes normal.     Comments: No clonus  Psychiatric:        Mood and Affect: Mood normal.        Behavior: Behavior normal.    Fetal Tracing: Baby A Baseline: 135 Variability: moderate Accelerations: 15x15 Decelerations: none  [name] Baseline: 140 Variability: moderate Accelerations:15x15 Decelerations: none   MAU Course  Procedures Results for orders placed or performed during the hospital encounter of 05/25/20 (from the past 24 hour(s))  Protein / creatinine ratio, urine     Status: None   Collection Time: 05/25/20 10:28 AM  Result Value Ref Range   Creatinine, Urine 108.52 mg/dL   Total Protein, Urine 7 mg/dL   Protein Creatinine Ratio 0.06 0.00 - 0.15 mg/mg[Cre]  CBC     Status: Abnormal   Collection Time: 05/25/20 10:36 AM  Result Value Ref Range   WBC 7.1 4.0 - 10.5 K/uL   RBC 3.94 3.87 - 5.11 MIL/uL   Hemoglobin 9.3 (L) 12.0 - 15.0 g/dL   HCT 28.3 (L) 36.0 - 46.0 %   MCV 71.8 (L) 80.0 - 100.0 fL   MCH 23.6 (L) 26.0 - 34.0 pg   MCHC 32.9 30.0 - 36.0 g/dL    RDW 15.9 (H) 11.5 - 15.5 %   Platelets 252 150 - 400 K/uL   nRBC 0.0 0.0 - 0.2 %  Comprehensive metabolic panel  Status: Abnormal   Collection Time: 05/25/20 10:36 AM  Result Value Ref Range   Sodium 135 135 - 145 mmol/L   Potassium 3.1 (L) 3.5 - 5.1 mmol/L   Chloride 106 98 - 111 mmol/L   CO2 23 22 - 32 mmol/L   Glucose, Bld 102 (H) 70 - 99 mg/dL   BUN <5 (L) 6 - 20 mg/dL   Creatinine, Ser 0.62 0.44 - 1.00 mg/dL   Calcium 8.4 (L) 8.9 - 10.3 mg/dL   Total Protein 5.7 (L) 6.5 - 8.1 g/dL   Albumin 2.2 (L) 3.5 - 5.0 g/dL   AST 35 15 - 41 U/L   ALT 22 0 - 44 U/L   Alkaline Phosphatase 255 (H) 38 - 126 U/L   Total Bilirubin 0.1 (L) 0.3 - 1.2 mg/dL   GFR, Estimated >60 >60 mL/min   Anion gap 6 5 - 15    MDM Patient with recently diagnosed gestational hypertension presents with headache. Had 2 mildly elevated BPs in MAU, otherwise has been normotensive. Normal preeclampsia labs.  Given tylenol & flexeril in MAU. Headache down to 3/0, not worse with movement as it was initially. Reviewed with Dr. Harolyn Rutherford. Will discharge home - strict return precautions, will prescribe some fioricet.   Assessment and Plan   1. Gestational hypertension, third trimester   2. Dichorionic diamniotic twin pregnancy in third trimester   3. [redacted] weeks gestation of pregnancy    -keep f/u in office on Tuesday. -reviewed preeclampsia precautions & reasons to return -rx fioricet - take 2 for headache, if no improvement should return to [place] 05/25/2020, 10:39 AM

## 2020-05-25 NOTE — Telephone Encounter (Signed)
Patient called in with concerns regarding high blood pressure. Patient was transferred to Or[facility] Flaming at [place] her pcp as we don't triage calls for them.

## 2020-05-25 NOTE — Progress Notes (Addendum)
   NURSE VISIT- BLOOD PRESSURE CHECK  SUBJECTIVE:  Misty Murillo is a 30 y.o. G66P1011 female here for BP check. She is [redacted]w[redacted]d pregnant    HYPERTENSION ROS:  Pregnant/postpartum:  . Severe headaches that don't go away with tylenol/other medicines: Yes  . Visual changes (seeing spots/double/blurred vision) Yes  . Severe pain under right breast breast or in center of upper chest No  . Severe nausea/vomiting No  . Taking medicines as instructed not applicable  .   OBJECTIVE:  BP 127/87   Pulse 90   Wt 246 lb 8 oz (111.8 kg)   LMP 09/25/2019   BMI 43.67 kg/m   Appearance alert, well appearing, and in no distress.  ASSESSMENT: Pregnancy [redacted]w[redacted]d  blood pressure check  PLAN: Discussed with Dr. Elonda Husky   Recommendations: pt was sent to MAU   Follow-up: as scheduled   Levy Pupa  05/25/2020 9:10 AM  Attestation of Attending Supervision of Nursing Visit Encounter: Evaluation and management procedures were performed by the nursing staff under my supervision and collaboration.  I have reviewed the nurse's note and chart, and I agree with the management and plan.  Jacelyn Grip MD Attending Physician for the Center for [facility] 05/26/2020 1:58 PM

## 2020-05-25 NOTE — MAU Note (Signed)
Patient c/o burning and pain in her lower right foot. Patient is currently wearing compression socks down around her ankles so we pulled them up around her calves to see if that offered any relief.

## 2020-05-25 NOTE — MAU Note (Signed)
Misty Murillo is a 30 y.o. at [redacted]w[redacted]d here in for severe headaches, high blood pressures, and lower extremity swelling. Pt sent from office. Pt was also here on 05/22/20 with same complaints.   Onset of complaint: Headaches  Pain score: 8/10 while moving, 4/10 while resting   FHT: Baby A 141, [name] 136

## 2020-05-25 NOTE — Discharge Instructions (Signed)
Hypertension During Pregnancy High blood pressure (hypertension) is when the force of blood pumping through the arteries is high enough to cause problems with your health. Arteries are blood vessels that carry blood from the heart throughout the body. Hypertension during pregnancy can cause problems for you and your baby. It can be mild or severe. There are different types of hypertension that can happen during pregnancy. These include:  Chronic hypertension. This happens when you had high blood pressure before you became pregnant, and it continues during the pregnancy. Hypertension that develops before you are [redacted] weeks pregnant and continues during the pregnancy is also called chronic hypertension. If you have chronic hypertension, it will not go away after you have your baby. You will need follow-up visits with your health care provider after you have your baby. Your health care provider may want you to keep taking medicine for your blood pressure.  Gestational hypertension. This is hypertension that develops after the 20th week of pregnancy. Gestational hypertension usually goes away after you have your baby, but your health care provider will need to monitor your blood pressure to make sure that it is getting better.  Postpartum hypertension. This is high blood pressure that was present before delivery and continues after delivery or that starts after delivery. This usually occurs within 48 hours after childbirth but may occur up to 6 weeks after giving birth. When hypertension during pregnancy is severe, it is a medical emergency that requires treatment right away. How does this affect me? Women who have hypertension during pregnancy have a greater chance of developing hypertension later in life or during future pregnancies. In some cases, hypertension during pregnancy can cause serious complications, such as:  Stroke.  Heart attack.  Injury to other organs, such as kidneys, lungs, or  liver.  Preeclampsia.  A condition called hemolysis, elevated liver enzymes, and low platelet count (HELLP) syndrome.  Convulsions or seizures.  Placental abruption. How does this affect my baby? Hypertension during pregnancy can affect your baby. Your baby may:  Be born early (prematurely).  Not weigh as much as he or she should at birth (low birth weight).  Not tolerate labor well, leading to an unplanned cesarean delivery. This condition may also result in a baby's death before birth (stillbirth). What are the risks? There are certain factors that make it more likely for you to develop hypertension during pregnancy. These include:  Having hypertension during a previous pregnancy or a family history of hypertension.  Being overweight.  Being age 35 or older.  Being pregnant for the first time.  Being pregnant with more than one baby.  Becoming pregnant using fertilization methods, such as IVF (in vitro fertilization).  Having other medical problems, such as diabetes, kidney disease, or lupus. What can I do to lower my risk? The exact cause of hypertension during pregnancy is not known. You may be able to lower your risk by:  Maintaining a healthy weight.  Eating a healthy and balanced diet.  Following your health care provider's instructions about treating any long-term conditions that you had before becoming pregnant. It is very important to keep all of your prenatal care appointments. Your health care provider will check your blood pressure and make sure that your pregnancy is progressing as expected. If a problem is found, early treatment can prevent complications.   How is this treated? Treatment for hypertension during pregnancy varies depending on the type of hypertension you have and how serious it is.  If you were   taking medicine for high blood pressure before you became pregnant, talk with your health care provider. You may need to change medicine during  pregnancy because some medicines, like ACE inhibitors, may not be considered safe for your baby.  If you have gestational hypertension, your health care provider may order medicine to treat this during pregnancy.  If you are at risk for preeclampsia, your health care provider may recommend that you take a low-dose aspirin during your pregnancy.  If you have severe hypertension, you may need to be hospitalized so you and your baby can be monitored closely. You may also need to be given medicine to lower your blood pressure.  In some cases, if your condition gets worse, you may need to deliver your baby early. Follow these instructions at home: Eating and drinking  Drink enough fluid to keep your urine pale yellow.  Avoid caffeine.   Lifestyle  Do not use any products that contain nicotine or tobacco. These products include cigarettes, chewing tobacco, and vaping devices, such as e-cigarettes. If you need help quitting, ask your health care provider.  Do not use alcohol or drugs.  Avoid stress as much as possible.  Rest and get plenty of sleep.  Regular exercise can help to reduce your blood pressure. Ask your health care provider what kinds of exercise are best for you. General instructions  Take over-the-counter and prescription medicines only as told by your health care provider.  Keep all prenatal and follow-up visits. This is important. Contact a health care provider if:  You have symptoms that your health care provider told you may require more treatment or monitoring, such as: ? Headaches. ? Nausea or vomiting. ? Abdominal pain. ? Dizziness. ? Light-headedness. Get help right away if:  You have symptoms of serious complications, such as: ? Severe abdominal pain that does not get better with treatment. ? A severe headache that does not get better, blurred vision, or double vision. ? Vomiting that does not get better. ? Sudden, rapid weight gain or swelling in your  hands, ankles, or face. ? Vaginal bleeding. ? Blood in your urine. ? Shortness of breath or chest pain. ? Weakness on one side of your body or difficulty speaking.  Your baby is not moving as much as usual. These symptoms may represent a serious problem that is an emergency. Do not wait to see if the symptoms will go away. Get medical help right away. Call your local emergency services (911 in the [place] Do not drive yourself to the hospital. Summary  Hypertension during pregnancy can cause problems for you and your baby.  Treatment for hypertension during pregnancy varies depending on the type of hypertension you have and how serious it is.  Keep all prenatal and follow-up visits. This is important.  Get help right away if you have symptoms of serious complications related to high blood pressure. This information is not intended to replace advice given to you by your health care provider. Make sure you discuss any questions you have with your health care provider. Document Revised: 09/29/2019 Document Reviewed: 09/29/2019 Elsevier Patient Education  2021 [employer]  

## 2020-05-29 ENCOUNTER — Telehealth (HOSPITAL_COMMUNITY): Payer: Self-pay | Admitting: *Deleted

## 2020-05-29 ENCOUNTER — Encounter: Payer: Self-pay | Admitting: Obstetrics & Gynecology

## 2020-05-29 ENCOUNTER — Encounter (HOSPITAL_COMMUNITY): Payer: Self-pay | Admitting: *Deleted

## 2020-05-29 ENCOUNTER — Other Ambulatory Visit: Payer: Self-pay

## 2020-05-29 ENCOUNTER — Ambulatory Visit: Payer: Medicaid Other | Admitting: Obstetrics & Gynecology

## 2020-05-29 VITALS — BP 136/83 | HR 100 | Wt 248.5 lb

## 2020-05-29 DIAGNOSIS — Z3A35 35 weeks gestation of pregnancy: Secondary | ICD-10-CM

## 2020-05-29 DIAGNOSIS — O0990 Supervision of high risk pregnancy, unspecified, unspecified trimester: Secondary | ICD-10-CM

## 2020-05-29 DIAGNOSIS — O133 Gestational [pregnancy-induced] hypertension without significant proteinuria, third trimester: Secondary | ICD-10-CM

## 2020-05-29 DIAGNOSIS — O30043 Twin pregnancy, dichorionic/diamniotic, third trimester: Secondary | ICD-10-CM

## 2020-05-29 LAB — POCT URINALYSIS DIPSTICK OB
Glucose, UA: NEGATIVE
Ketones, UA: NEGATIVE
Nitrite, UA: NEGATIVE
POC,PROTEIN,UA: NEGATIVE

## 2020-05-29 NOTE — Telephone Encounter (Signed)
Preadmission screen  

## 2020-05-29 NOTE — Progress Notes (Incomplete)
HIGH-RISK PREGNANCY VISIT Patient name: Misty Murillo MRN [id number]  Date of birth: [birth year] Chief Complaint:   High Risk Gestation  History of Present Illness:   Misty Murillo is a 30 y.o. G27P1011 female at [redacted]w[redacted]d with an Estimated Date of Delivery: 07/01/20 being seen today for ongoing management of a high-risk pregnancy complicated by {High Risk OB:23190}.  Today she reports {sx:14538}.  Depression screen [facility] 2/9 04/10/2020 12/19/2019 12/12/2019 07/21/2018 03/05/2018  Decreased Interest 0 2 3 1 3   Down, Depressed, Hopeless 0 0 1 0 3  PHQ - 2 Score 0 2 4 1 6   Altered sleeping 0 0 3 0 3  Tired, decreased energy 0 2 3 3 3   Change in appetite 0 2 3 2 3   Feeling bad or failure about yourself  0 0 0 0 3  Trouble concentrating 0 0 0 0 0  Moving slowly or fidgety/restless 0 0 0 0 0  Suicidal thoughts 0 0 0 0 0  PHQ-9 Score 0 6 13 6 18   Difficult doing work/chores - - - Not difficult at all Somewhat difficult    Contractions: Irregular. Vag. Bleeding: None.  Movement: Present. {Actions; denies-reports:120008} leaking of fluid.  Review of Systems:   Pertinent items are noted in HPI Denies abnormal vaginal discharge w/ itching/odor/irritation, headaches, visual changes, shortness of breath, chest pain, abdominal pain, severe nausea/vomiting, or problems with urination or bowel movements unless otherwise stated above. Pertinent History Reviewed:  Reviewed past medical,surgical, social, obstetrical and family history.  Reviewed problem list, medications and allergies. Physical Assessment:   Vitals:   05/29/20 1004  BP: 136/83  Pulse: 100  Weight: 248 lb 8 oz (112.7 kg)  Body mass index is 44.02 kg/m.           Physical Examination:   General appearance: {appearance:315021::"alert, well appearing, and in no distress"}  Mental status: {pe mental status_general use:313008::"alert, oriented to person, place, and time"}  Skin: warm & dry   Extremities: Edema: Mild pitting, slight indentation     Cardiovascular: normal heart rate noted  Respiratory: normal respiratory effort, no distress  Abdomen: gravid, soft, non-tender  Pelvic: {Blank single:19197::"Cervical exam performed","Cervical exam deferred"}         Fetal Status:     Movement: Present    Fetal Surveillance Testing today: ***   Chaperone: {Chaperone:19197::"N/A","Latisha Cresenzo","Janet Young","Amanda Andrews","Peggy Dones","Nicole Jones","Angel Neas"}    Results for orders placed or performed in visit on 05/29/20 (from the past 24 hour(s))  POC Urinalysis Dipstick OB   Collection Time: 05/29/20 10:07 AM  Result Value Ref Range   Color, UA     Clarity, UA     Glucose, UA Negative Negative   Bilirubin, UA     Ketones, UA neg    Spec Grav, UA     Blood, UA trace    pH, UA     POC,PROTEIN,UA Negative Negative, Trace, Small (1+), Moderate (2+), Large (3+), 4+   Urobilinogen, UA     Nitrite, UA neg    Leukocytes, UA Trace (A) Negative   Appearance     Odor      Assessment & Plan:  High-risk pregnancy: G3P1011 at [redacted]w[redacted]d with an Estimated Date of Delivery: 07/01/20   1) ***, {stable/unstable:60080}  2) ***, {stable/unstable:60080}  Meds: No orders of the defined types were placed in this encounter.   Labs/procedures today: {ob lab/procedures:25214}  Treatment Plan:  ***  Reviewed: {Blank single:19197::"Term","Preterm"} labor symptoms and general obstetric precautions including but not  limited to vaginal bleeding, contractions, leaking of fluid and fetal movement were reviewed in detail with the patient.  All questions were answered. {does does not:25387::"Does"} have home bp cuff. Office bp cuff given: {yes/no/default R/F:16384::"YKZ applicable"}. Check bp {weekly daily:25388::"weekly"}, let us know if consistently {pregnant bp:25389::">140 and/or >90"}.  Follow-up: Return in about 1 week (around 06/05/2020) for [place], with Dr Elonda Husky.   No future appointments.  Orders Placed This Encounter  Procedures  .  POC Urinalysis Dipstick OB   Florian Buff  05/29/2020 10:33 AM

## 2020-06-05 ENCOUNTER — Other Ambulatory Visit: Payer: Self-pay

## 2020-06-05 ENCOUNTER — Other Ambulatory Visit: Payer: Self-pay | Admitting: Obstetrics & Gynecology

## 2020-06-05 ENCOUNTER — Ambulatory Visit (INDEPENDENT_AMBULATORY_CARE_PROVIDER_SITE_OTHER): Payer: Medicaid Other | Admitting: Obstetrics & Gynecology

## 2020-06-05 ENCOUNTER — Other Ambulatory Visit (HOSPITAL_COMMUNITY)
Admission: RE | Admit: 2020-06-05 | Discharge: 2020-06-05 | Disposition: A | Discharge status: Home / Self Care | Payer: Medicaid Other | Source: Ambulatory Visit | Attending: Obstetrics & Gynecology | Admitting: Obstetrics & Gynecology

## 2020-06-05 ENCOUNTER — Encounter: Payer: Self-pay | Admitting: Obstetrics & Gynecology

## 2020-06-05 VITALS — BP 136/91 | HR 100 | Wt 241.0 lb

## 2020-06-05 DIAGNOSIS — O0993 Supervision of high risk pregnancy, unspecified, third trimester: Secondary | ICD-10-CM

## 2020-06-05 DIAGNOSIS — Z1389 Encounter for screening for other disorder: Secondary | ICD-10-CM

## 2020-06-05 DIAGNOSIS — O0990 Supervision of high risk pregnancy, unspecified, unspecified trimester: Secondary | ICD-10-CM

## 2020-06-05 DIAGNOSIS — Z3A36 36 weeks gestation of pregnancy: Secondary | ICD-10-CM

## 2020-06-05 LAB — POCT URINALYSIS DIPSTICK OB
Blood, UA: NEGATIVE
Glucose, UA: NEGATIVE
Ketones, UA: NEGATIVE
Leukocytes, UA: NEGATIVE
Nitrite, UA: NEGATIVE
POC,PROTEIN,UA: NEGATIVE

## 2020-06-05 MED ORDER — HYDROCHLOROTHIAZIDE 25 MG PO TABS: 25.0000 mg | ORAL_TABLET | Freq: Every day | ORAL | 0 refills | Status: DC

## 2020-06-05 NOTE — Progress Notes (Unsigned)
HIGH-RISK PREGNANCY VISIT Patient name: Misty Murillo MRN [id number]  Date of birth: 06-Sep-1990 Chief Complaint:   No chief complaint on file.  History of Present Illness:   Misty Murillo is a 30 y.o. G74P1011 female at [redacted]w[redacted]d with an Estimated Date of Delivery: 07/01/20 being seen today for ongoing management of a high-risk pregnancy complicated by {High Risk OB:23190}.  Today she reports {sx:14538}.  Depression screen [facility] 2/9 04/10/2020 12/19/2019 12/12/2019 07/21/2018 03/05/2018  Decreased Interest 0 2 3 1 3   Down, Depressed, Hopeless 0 0 1 0 3  PHQ - 2 Score 0 2 4 1 6   Altered sleeping 0 0 3 0 3  Tired, decreased energy 0 2 3 3 3   Change in appetite 0 2 3 2 3   Feeling bad or failure about yourself  0 0 0 0 3  Trouble concentrating 0 0 0 0 0  Moving slowly or fidgety/restless 0 0 0 0 0  Suicidal thoughts 0 0 0 0 0  PHQ-9 Score 0 6 13 6 18   Difficult doing work/chores - - - Not difficult at all Somewhat difficult     .  .   . {Actions; denies-reports:120008} leaking of fluid.  Review of Systems:   Pertinent items are noted in HPI Denies abnormal vaginal discharge w/ itching/odor/irritation, headaches, visual changes, shortness of breath, chest pain, abdominal pain, severe nausea/vomiting, or problems with urination or bowel movements unless otherwise stated above. Pertinent History Reviewed:  Reviewed past medical,surgical, social, obstetrical and family history.  Reviewed problem list, medications and allergies. Physical Assessment:  There were no vitals filed for this visit.There is no height or weight on file to calculate BMI.           Physical Examination:   General appearance: {appearance:315021::"alert, well appearing, and in no distress"}  Mental status: {pe mental status_general use:313008::"alert, oriented to person, place, and time"}  Skin: warm & dry   Extremities:      Cardiovascular: normal heart rate noted  Respiratory: normal respiratory effort, no distress  Abdomen:  gravid, soft, non-tender  Pelvic: {Blank single:19197::"Cervical exam performed","Cervical exam deferred"}         Fetal Status:          Fetal Surveillance Testing today: ***   Chaperone: {Chaperone:19197::"N/A","Latisha Cresenzo","Janet Young","Amanda Andrews","Peggy Dones","Nicole Jones","Angel Neas"}    Results for orders placed or performed in visit on 06/05/20 (from the past 24 hour(s))  POC Urinalysis Dipstick OB   Collection Time: 06/05/20 11:11 AM  Result Value Ref Range   Color, UA     Clarity, UA     Glucose, UA Negative Negative   Bilirubin, UA     Ketones, UA neg    Spec Grav, UA     Blood, UA neg    pH, UA     POC,PROTEIN,UA Negative Negative, Trace, Small (1+), Moderate (2+), Large (3+), 4+   Urobilinogen, UA     Nitrite, UA neg    Leukocytes, UA Negative Negative   Appearance     Odor      Assessment & Plan:  High-risk pregnancy: G3P1011 at [redacted]w[redacted]d with an Estimated Date of Delivery: 07/01/20   1) ***, {stable/unstable:60080}  2) ***, {stable/unstable:60080}  Meds: No orders of the defined types were placed in this encounter.   Labs/procedures today: {ob lab/procedures:25214}  Treatment Plan:  ***  Reviewed: {Blank single:19197::"Term","Preterm"} labor symptoms and general obstetric precautions including but not limited to vaginal bleeding, contractions, leaking of fluid and fetal movement were  reviewed in detail with the patient.  All questions were answered. {does does not:25387::"Does"} have home bp cuff. Office bp cuff given: {yes/no/default G/G:83662::"HUT applicable"}. Check bp {weekly daily:25388::"weekly"}, let us know if consistently {pregnant bp:25389::">140 and/or >90"}.  Follow-up: No follow-ups on file.   Future Appointments  Date Time Provider [place]  06/11/2020  7:30 AM [facility] [place] [facility] None    No orders of the defined types were placed in this encounter.  Florian Buff CNM, [facility] 06/05/2020 11:41 AM

## 2020-06-05 NOTE — Progress Notes (Signed)
HIGH-RISK PREGNANCY VISIT Patient name: Misty Murillo MRN [id number]  Date of birth: [birth year] Chief Complaint:   Routine Prenatal Visit and High Risk Gestation  History of Present Illness:   Misty GLENDENING is a 30 y.o. G56P1011 female at [redacted]w[redacted]d with an Estimated Date of Delivery: 07/01/20 being seen today for ongoing management of a high-risk pregnancy complicated by multiple gestation and gestational hypertension.  Today she reports no complaints.  Depression screen [facility] 2/9 04/10/2020 12/19/2019 12/12/2019 07/21/2018 03/05/2018  Decreased Interest 0 2 3 1 3   Down, Depressed, Hopeless 0 0 1 0 3  PHQ - 2 Score 0 2 4 1 6   Altered sleeping 0 0 3 0 3  Tired, decreased energy 0 2 3 3 3   Change in appetite 0 2 3 2 3   Feeling bad or failure about yourself  0 0 0 0 3  Trouble concentrating 0 0 0 0 0  Moving slowly or fidgety/restless 0 0 0 0 0  Suicidal thoughts 0 0 0 0 0  PHQ-9 Score 0 6 13 6 18   Difficult doing work/chores - - - Not difficult at all Somewhat difficult    Contractions: Irritability. Vag. Bleeding: None.  Movement: Present. denies leaking of fluid.  Review of Systems:   Pertinent items are noted in HPI Denies abnormal vaginal discharge w/ itching/odor/irritation, headaches, visual changes, shortness of breath, chest pain, abdominal pain, severe nausea/vomiting, or problems with urination or bowel movements unless otherwise stated above. Pertinent History Reviewed:  Reviewed past medical,surgical, social, obstetrical and family history.  Reviewed problem list, medications and allergies. Physical Assessment:   Vitals:   06/05/20 1111 06/05/20 1116  BP: (!) 142/86 (!) 136/91  Pulse: 100 100  Weight: 241 lb (109.3 kg)   Body mass index is 42.69 kg/m.           Physical Examination:   General appearance: alert, well appearing, and in no distress  Mental status: alert, oriented to person, place, and time  Skin: warm & dry   Extremities: Edema: Very deep pitting, indentation  lasts a long time    Cardiovascular: normal heart rate noted  Respiratory: normal respiratory effort, no distress  Abdomen: gravid, soft, non-tender  Pelvic: Cervical exam performed         Fetal Status:     Movement: Present    Fetal Surveillance Testing today: FHR 150s x 2   Chaperone: Marcelino Scot    Results for orders placed or performed in visit on 06/05/20 (from the past 24 hour(s))  POC Urinalysis Dipstick OB   Collection Time: 06/05/20 11:11 AM  Result Value Ref Range   Color, UA     Clarity, UA     Glucose, UA Negative Negative   Bilirubin, UA     Ketones, UA neg    Spec Grav, UA     Blood, UA neg    pH, UA     POC,PROTEIN,UA Negative Negative, Trace, Small (1+), Moderate (2+), Large (3+), 4+   Urobilinogen, UA     Nitrite, UA neg    Leukocytes, UA Negative Negative   Appearance     Odor      Assessment & Plan:  High-risk pregnancy: G3P1011 at [redacted]w[redacted]d with an Estimated Date of Delivery: 07/01/20   1) DiDi twins now definitively with [facility], vertex/transverse, begin IOL tonight at [place] was scheduled for 06/11/20 will move it up to tonight discussed with Dr Nehemiah Settle    Meds:  Meds ordered this encounter  Medications  .  hydrochlorothiazide (HYDRODIURIL) 25 MG tablet    Sig: Take 1 tablet (25 mg total) by mouth daily. To begin postpartum    Dispense:  30 tablet    Refill:  0    Labs/procedures today: GBS  Treatment Plan:  IOL tonight  Reviewed: No labor symptoms and general obstetric precautions including but not limited to vaginal bleeding, contractions, leaking of fluid and fetal movement were reviewed in detail with the patient.  All questions were answered. Does have home bp cuff. Office bp cuff given: not applicable. Check bp daily, let us know if consistently >140 and/or >90.  Follow-up: Return in about 10 days (around 06/15/2020) for Nurse only, BP check, MyChart Connect visit.   Future Appointments  Date Time Provider [place]  06/11/2020  7:30 AM  [facility] [place] None    Orders Placed This Encounter  Procedures  . Culture, beta strep (group b only)  . POC Urinalysis Dipstick OB   Florian Buff  06/05/2020 11:44 AM

## 2020-06-06 ENCOUNTER — Encounter (HOSPITAL_COMMUNITY): Payer: Self-pay | Admitting: Obstetrics & Gynecology

## 2020-06-06 ENCOUNTER — Inpatient Hospital Stay (HOSPITAL_COMMUNITY)
Admission: AD | Admit: 2020-06-06 | Discharge: 2020-06-09 | DRG: 806 | Disposition: A | Discharge status: Home / Self Care | Payer: Medicaid Other | Attending: Obstetrics & Gynecology | Admitting: Obstetrics & Gynecology

## 2020-06-06 ENCOUNTER — Inpatient Hospital Stay (HOSPITAL_COMMUNITY): Payer: Medicaid Other | Admitting: Anesthesiology

## 2020-06-06 ENCOUNTER — Encounter (HOSPITAL_COMMUNITY): Payer: Medicaid Other

## 2020-06-06 ENCOUNTER — Other Ambulatory Visit: Payer: Self-pay

## 2020-06-06 ENCOUNTER — Inpatient Hospital Stay (EMERGENCY_DEPARTMENT_HOSPITAL)
Admission: AD | Admit: 2020-06-06 | Discharge: 2020-06-06 | Disposition: A | Discharge status: Home / Self Care | Payer: Medicaid Other | Source: Home / Self Care | Attending: Obstetrics and Gynecology | Admitting: Obstetrics and Gynecology

## 2020-06-06 ENCOUNTER — Encounter (HOSPITAL_COMMUNITY): Payer: Self-pay | Admitting: Obstetrics and Gynecology

## 2020-06-06 DIAGNOSIS — R160 Hepatomegaly, not elsewhere classified: Secondary | ICD-10-CM | POA: Diagnosis present

## 2020-06-06 DIAGNOSIS — Z20822 Contact with and (suspected) exposure to covid-19: Secondary | ICD-10-CM | POA: Diagnosis present

## 2020-06-06 DIAGNOSIS — Z87891 Personal history of nicotine dependence: Secondary | ICD-10-CM | POA: Insufficient documentation

## 2020-06-06 DIAGNOSIS — O30049 Twin pregnancy, dichorionic/diamniotic, unspecified trimester: Secondary | ICD-10-CM | POA: Diagnosis present

## 2020-06-06 DIAGNOSIS — O30043 Twin pregnancy, dichorionic/diamniotic, third trimester: Secondary | ICD-10-CM | POA: Diagnosis present

## 2020-06-06 DIAGNOSIS — D62 Acute posthemorrhagic anemia: Secondary | ICD-10-CM | POA: Diagnosis not present

## 2020-06-06 DIAGNOSIS — Z79899 Other long term (current) drug therapy: Secondary | ICD-10-CM | POA: Insufficient documentation

## 2020-06-06 DIAGNOSIS — E876 Hypokalemia: Secondary | ICD-10-CM

## 2020-06-06 DIAGNOSIS — Z3A36 36 weeks gestation of pregnancy: Secondary | ICD-10-CM

## 2020-06-06 DIAGNOSIS — O0990 Supervision of high risk pregnancy, unspecified, unspecified trimester: Secondary | ICD-10-CM

## 2020-06-06 DIAGNOSIS — O134 Gestational [pregnancy-induced] hypertension without significant proteinuria, complicating childbirth: Secondary | ICD-10-CM | POA: Diagnosis not present

## 2020-06-06 DIAGNOSIS — D573 Sickle-cell trait: Secondary | ICD-10-CM | POA: Diagnosis not present

## 2020-06-06 DIAGNOSIS — O3413 Maternal care for benign tumor of corpus uteri, third trimester: Secondary | ICD-10-CM | POA: Diagnosis not present

## 2020-06-06 DIAGNOSIS — Z7982 Long term (current) use of aspirin: Secondary | ICD-10-CM | POA: Insufficient documentation

## 2020-06-06 DIAGNOSIS — U071 COVID-19: Secondary | ICD-10-CM | POA: Diagnosis present

## 2020-06-06 DIAGNOSIS — D259 Leiomyoma of uterus, unspecified: Secondary | ICD-10-CM | POA: Diagnosis not present

## 2020-06-06 DIAGNOSIS — Z8616 Personal history of COVID-19: Secondary | ICD-10-CM

## 2020-06-06 DIAGNOSIS — O9081 Anemia of the puerperium: Secondary | ICD-10-CM | POA: Diagnosis not present

## 2020-06-06 DIAGNOSIS — O133 Gestational [pregnancy-induced] hypertension without significant proteinuria, third trimester: Secondary | ICD-10-CM | POA: Insufficient documentation

## 2020-06-06 DIAGNOSIS — D219 Benign neoplasm of connective and other soft tissue, unspecified: Secondary | ICD-10-CM

## 2020-06-06 DIAGNOSIS — O99210 Obesity complicating pregnancy, unspecified trimester: Secondary | ICD-10-CM

## 2020-06-06 DIAGNOSIS — J1282 Pneumonia due to coronavirus disease 2019: Secondary | ICD-10-CM | POA: Diagnosis present

## 2020-06-06 DIAGNOSIS — O99892 Other specified diseases and conditions complicating childbirth: Secondary | ICD-10-CM | POA: Diagnosis not present

## 2020-06-06 DIAGNOSIS — O139 Gestational [pregnancy-induced] hypertension without significant proteinuria, unspecified trimester: Secondary | ICD-10-CM | POA: Diagnosis present

## 2020-06-06 DIAGNOSIS — Z8759 Personal history of other complications of pregnancy, childbirth and the puerperium: Secondary | ICD-10-CM | POA: Diagnosis present

## 2020-06-06 LAB — CBC
HCT: 29.3 % — ABNORMAL LOW (ref 36.0–46.0)
HCT: 28.4 % — ABNORMAL LOW (ref 36.0–46.0)
HCT: 29.2 % — ABNORMAL LOW (ref 36.0–46.0)
Hemoglobin: 9.7 g/dL — ABNORMAL LOW (ref 12.0–15.0)
Hemoglobin: 9.3 g/dL — ABNORMAL LOW (ref 12.0–15.0)
Hemoglobin: 9.5 g/dL — ABNORMAL LOW (ref 12.0–15.0)
MCH: 23.2 pg — ABNORMAL LOW (ref 26.0–34.0)
MCH: 23 pg — ABNORMAL LOW (ref 26.0–34.0)
MCH: 22.8 pg — ABNORMAL LOW (ref 26.0–34.0)
MCHC: 33.1 g/dL (ref 30.0–36.0)
MCHC: 32.7 g/dL (ref 30.0–36.0)
MCHC: 32.5 g/dL (ref 30.0–36.0)
MCV: 69.9 fL — ABNORMAL LOW (ref 80.0–100.0)
MCV: 70.1 fL — ABNORMAL LOW (ref 80.0–100.0)
MCV: 70.2 fL — ABNORMAL LOW (ref 80.0–100.0)
Platelets: 279 10*3/uL (ref 150–400)
Platelets: 275 10*3/uL (ref 150–400)
Platelets: 265 10*3/uL (ref 150–400)
RBC: 4.19 MIL/uL (ref 3.87–5.11)
RBC: 4.05 MIL/uL (ref 3.87–5.11)
RBC: 4.16 MIL/uL (ref 3.87–5.11)
RDW: 16.5 % — ABNORMAL HIGH (ref 11.5–15.5)
RDW: 16.5 % — ABNORMAL HIGH (ref 11.5–15.5)
RDW: 16.3 % — ABNORMAL HIGH (ref 11.5–15.5)
WBC: 7.7 10*3/uL (ref 4.0–10.5)
WBC: 7.5 10*3/uL (ref 4.0–10.5)
WBC: 7.7 10*3/uL (ref 4.0–10.5)
nRBC: 0 % (ref 0.0–0.2)
nRBC: 0 % (ref 0.0–0.2)
nRBC: 0 % (ref 0.0–0.2)

## 2020-06-06 LAB — COMPREHENSIVE METABOLIC PANEL
ALT: 33 U/L (ref 0–44)
ALT: 31 U/L (ref 0–44)
AST: 49 U/L — ABNORMAL HIGH (ref 15–41)
AST: 47 U/L — ABNORMAL HIGH (ref 15–41)
Albumin: 2.4 g/dL — ABNORMAL LOW (ref 3.5–5.0)
Albumin: 2.3 g/dL — ABNORMAL LOW (ref 3.5–5.0)
Alkaline Phosphatase: 304 U/L — ABNORMAL HIGH (ref 38–126)
Alkaline Phosphatase: 282 U/L — ABNORMAL HIGH (ref 38–126)
Anion gap: 7 (ref 5–15)
Anion gap: 8 (ref 5–15)
BUN: <5 mg/dL — ABNORMAL LOW (ref 6–20)
BUN: <5 mg/dL — ABNORMAL LOW (ref 6–20)
CO2: 21 mmol/L — ABNORMAL LOW (ref 22–32)
CO2: 22 mmol/L (ref 22–32)
Calcium: 8.8 mg/dL — ABNORMAL LOW (ref 8.9–10.3)
Calcium: 8.9 mg/dL (ref 8.9–10.3)
Chloride: 106 mmol/L (ref 98–111)
Chloride: 106 mmol/L (ref 98–111)
Creatinine, Ser: 0.55 mg/dL (ref 0.44–1.00)
Creatinine, Ser: 0.66 mg/dL (ref 0.44–1.00)
GFR, Estimated: >60 mL/min (ref >60)
GFR, Estimated: >60 mL/min (ref >60)
Glucose, Bld: 76 mg/dL (ref 70–99)
Glucose, Bld: 95 mg/dL (ref 70–99)
Potassium: 3.5 mmol/L (ref 3.5–5.1)
Potassium: 3.6 mmol/L (ref 3.5–5.1)
Sodium: 134 mmol/L — ABNORMAL LOW (ref 135–145)
Sodium: 136 mmol/L (ref 135–145)
Total Bilirubin: 0.3 mg/dL (ref 0.3–1.2)
Total Bilirubin: 0.5 mg/dL (ref 0.3–1.2)
Total Protein: 6.1 g/dL — ABNORMAL LOW (ref 6.5–8.1)
Total Protein: 6 g/dL — ABNORMAL LOW (ref 6.5–8.1)

## 2020-06-06 LAB — RESP PANEL BY RT-PCR (FLU A&B, COVID) ARPGX2
Influenza A by PCR: NEGATIVE
Influenza B by PCR: NEGATIVE
SARS Coronavirus 2 by RT PCR: NEGATIVE

## 2020-06-06 LAB — CERVICOVAGINAL ANCILLARY ONLY
Chlamydia: NEGATIVE
Comment: NEGATIVE
Comment: NORMAL
Neisseria Gonorrhea: NEGATIVE

## 2020-06-06 LAB — PROTEIN / CREATININE RATIO, URINE
Creatinine, Urine: 58.09 mg/dL
Creatinine, Urine: 100.12 mg/dL
Total Protein, Urine: <6 mg/dL
Total Protein, Urine: <6 mg/dL

## 2020-06-06 MED ORDER — LIDOCAINE HCL (PF) 1 % IJ SOLN
INTRAMUSCULAR | Status: DC | PRN
  Administered 2020-06-06: 8 mL via EPIDURAL

## 2020-06-06 MED ORDER — PHENYLEPHRINE 40 MCG/ML (10ML) SYRINGE FOR IV PUSH (FOR BLOOD PRESSURE SUPPORT): 80.0000 ug | PREFILLED_SYRINGE | INTRAVENOUS | Status: DC | PRN

## 2020-06-06 MED ORDER — OXYTOCIN-SODIUM CHLORIDE 30-0.9 UT/500ML-% IV SOLN
2.5000 [IU]/h | INTRAVENOUS | Status: DC
  Administered 2020-06-07: 2.5 [IU]/h via INTRAVENOUS
  Filled 2020-06-06: qty 500

## 2020-06-06 MED ORDER — LIDOCAINE HCL (PF) 1 % IJ SOLN: 30.0000 mL | INTRAMUSCULAR | Status: DC | PRN

## 2020-06-06 MED ORDER — PENICILLIN G POT IN DEXTROSE 60000 UNIT/ML IV SOLN
3.0000 10*6.[IU] | INTRAVENOUS | Status: DC
  Administered 2020-06-06 (×3): 3 10*6.[IU] via INTRAVENOUS
  Filled 2020-06-06 (×3): qty 50

## 2020-06-06 MED ORDER — ACETAMINOPHEN 325 MG PO TABS: 650.0000 mg | ORAL_TABLET | ORAL | Status: DC | PRN

## 2020-06-06 MED ORDER — ONDANSETRON HCL 4 MG/2ML IJ SOLN: 4.0000 mg | Freq: Four times a day (QID) | INTRAMUSCULAR | Status: DC | PRN

## 2020-06-06 MED ORDER — EPHEDRINE 5 MG/ML INJ: 10.0000 mg | INTRAVENOUS | Status: DC | PRN

## 2020-06-06 MED ORDER — OXYCODONE-ACETAMINOPHEN 5-325 MG PO TABS: 2.0000 | ORAL_TABLET | ORAL | Status: DC | PRN

## 2020-06-06 MED ORDER — OXYTOCIN BOLUS FROM INFUSION
333.0000 mL | Freq: Once | INTRAVENOUS | Status: AC
  Administered 2020-06-07: 333 mL via INTRAVENOUS

## 2020-06-06 MED ORDER — OXYCODONE-ACETAMINOPHEN 5-325 MG PO TABS
1.0000 | ORAL_TABLET | ORAL | Status: DC | PRN
Start: 2020-06-06 — End: 2020-06-07

## 2020-06-06 MED ORDER — OXYTOCIN-SODIUM CHLORIDE 30-0.9 UT/500ML-% IV SOLN
1.0000 m[IU]/min | INTRAVENOUS | Status: DC
Start: 2020-06-06 — End: 2020-06-07
  Administered 2020-06-06: 2 m[IU]/min via INTRAVENOUS
  Filled 2020-06-06: qty 500

## 2020-06-06 MED ORDER — SODIUM CHLORIDE 0.9 % IV SOLN
5.0000 10*6.[IU] | Freq: Once | INTRAVENOUS | Status: AC
  Administered 2020-06-06: 5 10*6.[IU] via INTRAVENOUS
  Filled 2020-06-06: qty 5

## 2020-06-06 MED ORDER — SOD CITRATE-CITRIC ACID 500-334 MG/5ML PO SOLN
30.0000 mL | ORAL | Status: DC | PRN
  Administered 2020-06-06: 30 mL via ORAL
  Filled 2020-06-06: qty 15

## 2020-06-06 MED ORDER — LACTATED RINGERS IV SOLN: 500.0000 mL | Freq: Once | INTRAVENOUS | Status: DC

## 2020-06-06 MED ORDER — MISOPROSTOL 25 MCG QUARTER TABLET: 25.0000 ug | ORAL_TABLET | ORAL | Status: DC | PRN

## 2020-06-06 MED ORDER — TERBUTALINE SULFATE 1 MG/ML IJ SOLN: 0.2500 mg | Freq: Once | INTRAMUSCULAR | Status: DC | PRN

## 2020-06-06 MED ORDER — DIPHENHYDRAMINE HCL 50 MG/ML IJ SOLN: 12.5000 mg | INTRAMUSCULAR | Status: DC | PRN

## 2020-06-06 MED ORDER — LACTATED RINGERS IV SOLN: 500.0000 mL | INTRAVENOUS | Status: DC | PRN

## 2020-06-06 MED ORDER — FENTANYL CITRATE (PF) 100 MCG/2ML IJ SOLN: 50.0000 ug | INTRAMUSCULAR | Status: DC | PRN

## 2020-06-06 MED ORDER — FENTANYL-BUPIVACAINE-NACL 0.5-0.125-0.9 MG/250ML-% EP SOLN
12.0000 mL/h | EPIDURAL | Status: DC | PRN
Start: 2020-06-06 — End: 2020-06-07
  Filled 2020-06-06: qty 250

## 2020-06-06 MED ORDER — LACTATED RINGERS IV SOLN: INTRAVENOUS | Status: DC

## 2020-06-06 NOTE — H&P (Signed)
OBSTETRIC ADMISSION HISTORY AND PHYSICAL  Misty Murillo is a 30 y.o. female G36P1011 with IUP at [redacted]w[redacted]d by LMP presenting for IOL secondary to gHTN in the setting of DiDi twins. She reports +FMs, No LOF, no VB, no blurry vision, headaches or peripheral edema, and RUQ pain.  She plans on breast and formula feeding. She requests postpartum BTL for birth control.  She received her prenatal care at [facility]   Dating: By LMP --->  Estimated Date of Delivery: 07/01/20  Sono:  @[redacted]w[redacted]d , CWD Twin A: vertex presentation, 1890g, 32% EFW Twin B: transverse presentation, 1854g, 28% EFW FW discordance 1.9%  Prenatal History/Complications:  - gHTN - Grave's disease (no medications, followed by Endocrinology) - Sickle cell trait - Liver mass (focal nodular hyperplasia) - Fibroids - Hypokalemia (K replacement in pregnancy) - COVID Pneumonia in pregnancy  Past Medical History: Past Medical History:  Diagnosis Date  . Anxiety   . Depression    denies  . Fibroid   . Graves disease   . Headache   . Liver mass   . Migraines   . PID (acute pelvic inflammatory disease) 07/2019  . Pregnancy induced hypertension     Past Surgical History: Past Surgical History:  Procedure Laterality Date  . CHOLECYSTECTOMY    . RESECTION LIVER      Obstetrical History: OB History    Gravida  3   Para  1   Term  1   Preterm  0   AB  1   Living  1     SAB  0   IAB  1   Ectopic  0   Multiple  0   Live Births  1           Social History Social History   Socioeconomic History  . Marital status: Single    Spouse name: Not on file  . Number of children: Not on file  . Years of education: Not on file  . Highest education level: Not on file  Occupational History  . Not on file  Tobacco Use  . Smoking status: Former Smoker    Packs/day: 0.00    Types: Cigars    Quit date: 01/27/2018    Years since quitting: 2.3  . Smokeless tobacco: Never Used  Vaping Use  . Vaping Use: Never used   Substance and Sexual Activity  . Alcohol use: Not Currently    Comment: occasionally  . Drug use: Not Currently    Types: Marijuana    Comment: last used mid October  . Sexual activity: Not Currently    Birth control/protection: None  Other Topics Concern  . Not on file  Social History Narrative  . Not on file   Social Determinants of Health   Financial Resource Strain: Not on file  Food Insecurity: No Food Insecurity  . Worried About Charity fundraiser in the Last Year: Never true  . Ran Out of Food in the Last Year: Never true  Transportation Needs: No Transportation Needs  . Lack of Transportation (Medical): No  . Lack of Transportation (Non-Medical): No  Physical Activity: Not on file  Stress: Not on file  Social Connections: Not on file    Family History: Family History  Problem Relation Age of Onset  . Diabetes Mother   . Hyperthyroidism Mother   . Migraines Father   . Hypertension Father   . Cancer Other   . Hyperthyroidism Brother   . Hypertension Brother   .  Cancer Maternal Grandmother   . Cancer Maternal Grandfather     Allergies: No Known Allergies  Medications Prior to Admission  Medication Sig Dispense Refill Last Dose  . aspirin EC 81 MG tablet Take 1 tablet (81 mg total) by mouth daily. Take after 12 weeks for prevention of preeclampsia later in pregnancy 300 tablet 2   . Blood Pressure Monitoring DEVI 1 each by Does not apply route once a week. 1 each 0   . butalbital-acetaminophen-caffeine (FIORICET) 50-325-40 MG tablet Take 2 tablets by mouth every 12 (twelve) hours as needed for headache. 12 tablet 0   . Elastic Bandages & Supports (COMFORT FIT MATERNITY SUPP LG) MISC 1 application by Does not apply route daily. 1 each 0   . Elastic Bandages & Supports (MEDICAL COMPRESSION SOCKS) MISC Wear daily as needed for lower leg swelling 2 each 1   . Famotidine (PEPCID PO) Take by mouth.     . ferrous sulfate 325 (65 FE) MG tablet Take 1 tablet (325 mg  total) by mouth every other day. 45 tablet 2   . hydrochlorothiazide (HYDRODIURIL) 25 MG tablet Take 1 tablet (25 mg total) by mouth daily. To begin postpartum 30 tablet 0   . ondansetron (ZOFRAN ODT) 4 MG disintegrating tablet Take 1 tablet (4 mg total) by mouth every 8 (eight) hours as needed for nausea or vomiting. 20 tablet 6   . pantoprazole (PROTONIX) 40 MG tablet Take 1 tablet (40 mg total) by mouth daily. 30 tablet 11   . potassium chloride SA (KLOR-CON) 20 MEQ tablet Take 2 tablets (40 mEq total) by mouth 2 (two) times daily. 60 tablet 3   . Prenatal Vit-Fe Fumarate-FA (PRENATAL MULTIVITAMIN) TABS tablet Take 1 tablet by mouth daily at 12 noon.     . progesterone (PROMETRIUM) 200 MG capsule Place 1 capsule (200 mg total) vaginally at bedtime. 90 capsule 1      Review of Systems   All systems reviewed and negative except as stated in HPI  Last menstrual period 09/25/2019, unknown if currently breastfeeding. General appearance: alert, cooperative and appears stated age Lungs: normal WOB Heart: regular rate  Abdomen: soft, non-tender Extremities: no sign of DVT Presentation: cephalic/transverse(fetal head to maternal R) Fetal monitoring: Twin A: baseline 120, moderate variability, +accels, no decels Twin B: baseline 125, moderate variability, +accels, no decels Uterine activity: irregular    Prenatal labs: ABO, Rh: --/--/PENDING (05/18 0031) Antibody: PENDING (05/18 0031) Rubella: 3.22 (11/29 1107) RPR: Non Reactive (03/22 0853)  HBsAg: Negative (11/29 1107)  HIV: Non Reactive (03/22 0853)  GBS:   unknown (collected in clinic, pending on admission) 2 hr Glucola wnl Genetic screening  wnl except for sickle cell trait Anatomy US wnl, didi twins  Prenatal Transfer Tool  Maternal Diabetes: No Genetic Screening: Normal except for maternal sickle cell trait Maternal Ultrasounds/Referrals: Normal, didi twins Fetal Ultrasounds or other Referrals:  Referred to Materal Fetal  Medicine  Maternal Substance Abuse:  No Significant Maternal Medications:  None Significant Maternal Lab Results: GBS unknown, maternal Graves disease (no medications)  Results for orders placed or performed during the hospital encounter of 06/06/20 (from the past 24 hour(s))  Type and screen   Collection Time: 06/06/20 12:31 AM  Result Value Ref Range   ABO/RH(D) PENDING    Antibody Screen PENDING    Sample Expiration      06/09/2020,2359 Performed at [facility], [address], [place], [place] [address]   Results for orders placed or  performed during the hospital encounter of 06/06/20 (from the past 24 hour(s))  CBC   Collection Time: 06/06/20 12:30 AM  Result Value Ref Range   WBC 7.7 4.0 - 10.5 K/uL   RBC 4.19 3.87 - 5.11 MIL/uL   Hemoglobin 9.7 (L) 12.0 - 15.0 g/dL   HCT 29.3 (L) 36.0 - 46.0 %   MCV 69.9 (L) 80.0 - 100.0 fL   MCH 23.2 (L) 26.0 - 34.0 pg   MCHC 33.1 30.0 - 36.0 g/dL   RDW 16.5 (H) 11.5 - 15.5 %   Platelets 279 150 - 400 K/uL   nRBC 0.0 0.0 - 0.2 %  Comprehensive metabolic panel   Collection Time: 06/06/20 12:30 AM  Result Value Ref Range   Sodium 134 (L) 135 - 145 mmol/L   Potassium 3.5 3.5 - 5.1 mmol/L   Chloride 106 98 - 111 mmol/L   CO2 21 (L) 22 - 32 mmol/L   Glucose, Bld 76 70 - 99 mg/dL   BUN <5 (L) 6 - 20 mg/dL   Creatinine, Ser 0.55 0.44 - 1.00 mg/dL   Calcium 8.8 (L) 8.9 - 10.3 mg/dL   Total Protein 6.1 (L) 6.5 - 8.1 g/dL   Albumin 2.4 (L) 3.5 - 5.0 g/dL   AST 49 (H) 15 - 41 U/L   ALT 33 0 - 44 U/L   Alkaline Phosphatase 304 (H) 38 - 126 U/L   Total Bilirubin 0.3 0.3 - 1.2 mg/dL   GFR, Estimated >60 >60 mL/min   Anion gap 7 5 - 15  Protein / creatinine ratio, urine   Collection Time: 06/06/20  1:05 AM  Result Value Ref Range   Creatinine, Urine 58.09 mg/dL   Total Protein, Urine <6 mg/dL   Protein Creatinine Ratio        0.00 - 0.15 mg/mg[Cre]  Results for orders placed or performed in visit on 06/05/20 (from the past 24  hour(s))  POC Urinalysis Dipstick OB   Collection Time: 06/05/20 11:11 AM  Result Value Ref Range   Color, UA     Clarity, UA     Glucose, UA Negative Negative   Bilirubin, UA     Ketones, UA neg    Spec Grav, UA     Blood, UA neg    pH, UA     POC,PROTEIN,UA Negative Negative, Trace, Small (1+), Moderate (2+), Large (3+), 4+   Urobilinogen, UA     Nitrite, UA neg    Leukocytes, UA Negative Negative   Appearance     Odor      Patient Active Problem List   Diagnosis Date Noted  . Gestational hypertension 06/06/2020  . Cervix, short (affecting pregnancy) 02/29/2020  . Pneumonia due to COVID-19 virus 02/14/2020  . Sepsis ([place]) 02/14/2020  . Acute hypoxemic respiratory failure ([place]) 02/14/2020  . Intractable nausea and vomiting 02/14/2020  . Diarrhea 02/14/2020  . Hypokalemia 01/17/2020  . Supervision of high risk pregnancy, antepartum 12/12/2019  . Fibroid   . Dichorionic diamniotic twin gestation 07/2019  . Obesity in pregnancy 07/2019  . Graves' disease 08/03/2018  . Hyperthyroidism 03/08/2018  . Vitamin D deficiency 03/08/2018  . Chronic urticaria 07/11/2017  . Angioedema 07/11/2017  . Sickle cell trait ([place]) 05/26/2017  . Liver mass, left lobe 05/18/2017    Assessment/Plan:  Misty Murillo is a 30 y.o. G3P1011 at [redacted]w[redacted]d here for gHTN in the setting of didi twins.  #Induction of Labor: Given initial cervical exam, started pitocin.  #Pain: pt  desires epidural prior to AROM #FWB: Category 1 strip x2 #ID: GBS unknown; PCN started on admission #MOF: breast & formula #MOC: desire for interval BTL (consent signed 04/10/20) #Circ: desired #Graves disease: No medications in pregnancy. Plan for follow-up with endocrinologist s/p delivery. #gHTN: Preeclampsia labs on admission notable for AST 49 and Hgb 9.3.  Randa Ngo, MD OB Fellow, Faculty Practice 06/06/2020 6:32 PM

## 2020-06-06 NOTE — Anesthesia Procedure Notes (Signed)
Epidural Patient location during procedure: OB Start time: 06/06/2020 7:40 PM End time: 06/06/2020 7:50 PM  Staffing Anesthesiologist: Merlinda Frederick, MD Performed: anesthesiologist   Preanesthetic Checklist Completed: patient identified, IV checked, site marked, risks and benefits discussed, monitors and equipment checked, pre-op evaluation and timeout performed  Epidural Patient position: sitting Prep: DuraPrep Patient monitoring: heart rate, cardiac monitor, continuous pulse ox and blood pressure Approach: midline Location: L2-L3 Injection technique: LOR saline  Needle:  Needle type: Tuohy  Needle gauge: 17 G Needle length: 9 cm Needle insertion depth: 7 cm Catheter type: closed end flexible Catheter size: 20 Guage Catheter at skin depth: 12 cm Test dose: negative and Other  Assessment Events: blood not aspirated, injection not painful, no injection resistance and negative IV test  Additional Notes Informed consent obtained prior to proceeding including risk of failure, 1% risk of PDPH, risk of minor discomfort and bruising.  Discussed rare but serious complications including epidural abscess, permanent nerve injury, epidural hematoma.  Discussed alternatives to epidural analgesia and patient desires to proceed.  Timeout performed pre-procedure verifying patient name, procedure, and platelet count.  Patient tolerated procedure well.

## 2020-06-06 NOTE — Anesthesia Preprocedure Evaluation (Signed)
Anesthesia Evaluation  Patient identified by MRN, date of birth, ID band Patient awake    Reviewed: Allergy & Precautions, NPO status , Patient's Chart, lab work & pertinent test results  Airway Mallampati: III  TM Distance: >3 FB Neck ROM: Full    Dental no notable dental hx.    Pulmonary pneumonia (covid pneumonia in 01/2020), resolved, former smoker,    Pulmonary exam normal breath sounds clear to auscultation       Cardiovascular hypertension, negative cardio ROS Normal cardiovascular exam Rhythm:Regular Rate:Normal     Neuro/Psych  Headaches, PSYCHIATRIC DISORDERS Anxiety Depression    GI/Hepatic negative GI ROS, Neg liver ROS,   Endo/Other  Morbid obesityGraves' disease  Renal/GU negative Renal ROS  negative genitourinary   Musculoskeletal negative musculoskeletal ROS (+)   Abdominal   Peds negative pediatric ROS (+)  Hematology  (+) Sickle cell trait and anemia ,   Anesthesia Other Findings   Reproductive/Obstetrics negative OB ROS                             Anesthesia Physical Anesthesia Plan  ASA: IV  Anesthesia Plan: Epidural   Post-op Pain Management:    Induction:   PONV Risk Score and Plan: 2  Airway Management Planned: Natural Airway  Additional Equipment:   Intra-op Plan:   Post-operative Plan:   Informed Consent: I have reviewed the patients History and Physical, chart, labs and discussed the procedure including the risks, benefits and alternatives for the proposed anesthesia with the patient or authorized representative who has indicated his/her understanding and acceptance.       Plan Discussed with: Anesthesiologist  Anesthesia Plan Comments:         Anesthesia Quick Evaluation

## 2020-06-06 NOTE — MAU Provider Note (Addendum)
History     CSN: [id number]  Arrival date and time: 06/06/20 0012   None     No chief complaint on file.  HPI  Patient is a g3p1011 at [redacted]w[redacted]d w Di/Di Twins who presents today after being told in clinic to come to hospital for induction. Reports being recently diagnosed with gestational htn a few weeks ago and has been closely monitored. Says that today she is unsure why they decided she needed earlier induction, but that she is sure he discussed it with someone. She reports her blood pressures have been in 140/90 for 2-3 weeks now. Denies any severe range blood pressures at home or in office. Had been seen in MAU on 5/6 for a headache. The headache resolved with tylenol/flexeril. Denies headache currently. Denies vision changes. Reports her legs have been swollen since onset of third trimester. No RUQ pain. No shortness of breath. Patient has di/di twins and twin A is cephalic.   OB History    Gravida  3   Para  1   Term  1   Preterm  0   AB  1   Living  1     SAB  0   IAB  1   Ectopic  0   Multiple  0   Live Births  1           Past Medical History:  Diagnosis Date  . Anxiety   . Depression    denies  . Fibroid   . Graves disease   . Headache   . Liver mass   . Migraines   . PID (acute pelvic inflammatory disease) 07/2019  . Pregnancy induced hypertension     Past Surgical History:  Procedure Laterality Date  . CHOLECYSTECTOMY    . RESECTION LIVER      Family History  Problem Relation Age of Onset  . Diabetes Mother   . Hyperthyroidism Mother   . Migraines Father   . Hypertension Father   . Cancer Other   . Hyperthyroidism Brother   . Hypertension Brother   . Cancer Maternal Grandmother   . Cancer Maternal Grandfather     Social History   Tobacco Use  . Smoking status: Former Smoker    Packs/day: 0.00    Types: Cigars    Quit date: 01/27/2018    Years since quitting: 2.3  . Smokeless tobacco: Never Used  Vaping Use  . Vaping Use:  Never used  Substance Use Topics  . Alcohol use: Not Currently    Comment: occasionally  . Drug use: Not Currently    Types: Marijuana    Comment: last used mid October    Allergies: No Known Allergies  Medications Prior to Admission  Medication Sig Dispense Refill Last Dose  . aspirin EC 81 MG tablet Take 1 tablet (81 mg total) by mouth daily. Take after 12 weeks for prevention of preeclampsia later in pregnancy 300 tablet 2   . Blood Pressure Monitoring DEVI 1 each by Does not apply route once a week. 1 each 0   . butalbital-acetaminophen-caffeine (FIORICET) 50-325-40 MG tablet Take 2 tablets by mouth every 12 (twelve) hours as needed for headache. 12 tablet 0   . Elastic Bandages & Supports (COMFORT FIT MATERNITY SUPP LG) MISC 1 application by Does not apply route daily. 1 each 0   . Elastic Bandages & Supports (MEDICAL COMPRESSION SOCKS) MISC Wear daily as needed for lower leg swelling 2 each 1   . Famotidine (PEPCID  PO) Take by mouth.     . ferrous sulfate 325 (65 FE) MG tablet Take 1 tablet (325 mg total) by mouth every other day. 45 tablet 2   . hydrochlorothiazide (HYDRODIURIL) 25 MG tablet Take 1 tablet (25 mg total) by mouth daily. To begin postpartum 30 tablet 0   . ondansetron (ZOFRAN ODT) 4 MG disintegrating tablet Take 1 tablet (4 mg total) by mouth every 8 (eight) hours as needed for nausea or vomiting. 20 tablet 6   . pantoprazole (PROTONIX) 40 MG tablet Take 1 tablet (40 mg total) by mouth daily. 30 tablet 11   . potassium chloride SA (KLOR-CON) 20 MEQ tablet Take 2 tablets (40 mEq total) by mouth 2 (two) times daily. 60 tablet 3   . Prenatal Vit-Fe Fumarate-FA (PRENATAL MULTIVITAMIN) TABS tablet Take 1 tablet by mouth daily at 12 noon.     . progesterone (PROMETRIUM) 200 MG capsule Place 1 capsule (200 mg total) vaginally at bedtime. 90 capsule 1     Review of Systems  Constitutional: Negative for activity change, appetite change and chills.  Respiratory: Negative for  chest tightness and shortness of breath.   Cardiovascular: Positive for leg swelling.  Gastrointestinal: Negative for abdominal pain.  Neurological: Negative for dizziness, facial asymmetry, speech difficulty and headaches.   Physical Exam   Blood pressure 139/89, pulse 93, temperature 98.2 F (36.8 C), temperature source Oral, resp. rate 20, height 5\' 3"  (1.6 m), weight 109.6 kg, last menstrual period 09/25/2019, unknown if currently breastfeeding.  Physical Exam Vitals and nursing note reviewed.  Constitutional:      Appearance: Normal appearance.  Cardiovascular:     Rate and Rhythm: Normal rate.  Pulmonary:     Effort: Pulmonary effort is normal.  Abdominal:     Palpations: Abdomen is soft.     Tenderness: There is no abdominal tenderness.  Musculoskeletal:     Comments: 2+ pitting edema to mid-shin bilaterally   Skin:    General: Skin is warm and dry.  Neurological:     Mental Status: She is alert.     Comments: Biceps, patellar reflexes are normal, 2+ and symmetric. There is no clonus.   Psychiatric:        Mood and Affect: Mood normal.        Behavior: Behavior normal.     MAU Course  Procedures  MDM Pt evaluated at bedside. Reviewed provider note from earlier and discussed with Dr. Elgie Congo. BP Mild range, no severe range pressures in MAU. Repeat PEC labs  Labs notable AST increased from 35 to 49, ALT normal, Creatinine normal, Plts normal at 279  NST Reactive. Twin A 130/mod variability/pos accels/no decels   Twin B 140/mod variability/pos accel/no decels  Discussed w Dr. Elgie Congo who had also spoken to L&D Charge RN. Patient had never been formally scheduled for induction today, and there is no room on L&D given current census. Will plan for dc home with BP visit tomorrow. Will discuss case with oncoming attendings and possibly move induction up from 5/23. Patient in agreement with plan.    Assessment and Plan   Di/Di Twins, Gestational HTN  -stable for d/c home  but with close follow up -message sent for repeat BP check in office, pt still scheduled for 5/23 induction at this time   -return precautions provided to patient    Janet Berlin 06/06/2020, 1:35 AM

## 2020-06-06 NOTE — MAU Note (Signed)
PT SAYS SHE WAS SUPPOSE TO BE INDUCED -FOR HIGH BP AND SWELLING PNC WITH FAMILY TREE- DR EURE- TOLD PT TO BE HERE AT 2345.  DILATED 2 CM . HAS UC- MILD OCC UC'S

## 2020-06-06 NOTE — Discharge Instructions (Signed)
Preeclampsia and Eclampsia Preeclampsia is a serious condition that may develop during pregnancy. This condition involves high blood pressure during pregnancy and causes symptoms such as headaches, vision changes, and increased swelling in the legs, hands, and face. Preeclampsia occurs after 20 weeks of pregnancy. Eclampsia is a seizure that happens from worsening preeclampsia. Diagnosing and managing preeclampsia early is important. If not treated early, it can cause serious problems for mother and baby. There is no cure for this condition. However, during pregnancy, delivering the baby may be the best treatment for preeclampsia or eclampsia. For most women, symptoms of preeclampsia and eclampsia go away after giving birth. In rare cases, a woman may develop preeclampsia or eclampsia after giving birth. This usually occurs within 48 hours after childbirth but may occur up to 6 weeks after giving birth. What are the causes? The cause of this condition is not known. What increases the risk? The following factors make you more likely to develop preeclampsia:  Being pregnant for the first time or being pregnant with multiples.  Having had preeclampsia or a condition called hemolysis, elevated liver enzymes, and low platelet count (HELLP)syndrome during a past pregnancy.  Having a family history of preeclampsia.  Being older than age 35.  Being obese.  Becoming pregnant through fertility treatments. Conditions that reduce blood flow or oxygen to your placenta and baby may also increase your risk. These include:  High blood pressure before, during, or immediately following pregnancy.  Kidney disease.  Diabetes.  Blood clotting disorders.  Autoimmune diseases, such as lupus.  Sleep apnea. What are the signs or symptoms? Common symptoms of this condition include:  A severe, throbbing headache that does not go away.  Vision problems, such as blurred or double vision and light  sensitivity.  Pain in the stomach, especially the right upper region.  Pain in the shoulder. Other symptoms that may develop as the condition gets worse include:  Sudden weight gain because of fluid buildup in the body. This causes swelling of the face, hands, legs, and feet.  Severe nausea and vomiting.  Urinating less than usual.  Shortness of breath.  Seizures. How is this diagnosed? Your health care provider will ask you about symptoms and check for signs of preeclampsia during your prenatal visits. You will also have routine tests, including:  Checking your blood pressure.  Urine tests to check for protein.  Blood tests to assess your organ function.  Monitoring your baby's heart rate.  Ultrasounds to check fetal growth.   How is this treated? You and your health care provider will determine the treatment that is best for you. Treatment may include:  Frequent prenatal visits to check for preeclampsia.  Medicine to lower your blood pressure.  Medicine to prevent seizures.  Low-dose aspirin during your pregnancy.  Staying in the hospital, in severe cases. You will be given medicines to control your blood pressure and the amount of fluids in your body.  Delivering your baby. Work with your health care provider to manage any chronic health conditions, such as diabetes or kidney problems. Also, work with your health care provider to manage weight gain during pregnancy. Follow these instructions at home: Eating and drinking  Drink enough fluid to keep your urine pale yellow.  Avoid caffeine. Caffeine may increase blood pressure and heart rate and lead to dehydration.  Reduce the amount of salt that you eat. Lifestyle  Do not use any products that contain nicotine or tobacco. These products include cigarettes, chewing tobacco, and   vaping devices, such as e-cigarettes. If you need help quitting, ask your health care provider.  Do not use alcohol or drugs.  Avoid  stress as much as possible.  Rest and get plenty of sleep. General instructions  Take over-the-counter and prescription medicines only as told by your health care provider.  When lying down, lie on your left side. This keeps pressure off your major blood vessels.  When sitting or lying down, raise (elevate) your feet. Try putting pillows underneath your lower legs.  Exercise regularly. Ask your health care provider what kinds of exercise are best for you.  Check your blood pressure as often as recommended by your health care provider.  Keep all prenatal and follow-up visits. This is important.   Contact a health care provider if:  You have symptoms that may need treatment or closer monitoring. These include: ? Headaches. ? Stomach pain or nausea and vomiting. ? Shoulder pain. ? Vision problems, such as spots in front of your eyes or blurry vision. ? Sudden weight gain or increased swelling in your face, hands, legs, and feet. ? Increased anxiety or feeling of impending doom. ? Signs or symptoms of labor. Get help right away if:  You have any of the following symptoms: ? A seizure. ? Shortness of breath or trouble breathing. ? Trouble speaking or slurred speech. ? Fainting. ? Chest pain. These symptoms may represent a serious problem that is an emergency. Do not wait to see if the symptoms will go away. Get medical help right away. Call your local emergency services (911 in the [place] Do not drive yourself to the hospital. Summary  Preeclampsia is a serious condition that may develop during pregnancy.  Diagnosing and treating preeclampsia early is very important.  Keep all prenatal and follow-up visits. This is important.  Get help right away if you have a seizure, shortness of breath or trouble breathing, trouble speaking or slurred speech, chest pain, or fainting. This information is not intended to replace advice given to you by your health care provider. Make sure you  discuss any questions you have with your health care provider. Document Revised: 09/29/2019 Document Reviewed: 09/29/2019 Elsevier Patient Education  2021 Elsevier Inc.  

## 2020-06-06 NOTE — Progress Notes (Signed)
Labor Progress Note Misty Murillo is a 30 y.o. G3P1011 at [redacted]w[redacted]d presented for IOL for gestational HTN.   S: Feeling comfy w epidural  O:  BP 129/80   Pulse 79   Temp 98.2 F (36.8 C) (Oral)   Resp 18   LMP 09/25/2019   SpO2 100%  EFM: Twin A: 130/mod var/pos accel/no decel  Twin B: 140/mod var/pos accel/no decel  CVE: Dilation: 4 Effacement (%): 80 Cervical Position: Posterior Station: -1 Presentation: Vertex Exam by:: Dr. Berniece Andreas   A&P: 30 y.o. [id number] [redacted]w[redacted]d here for IOL for gHTN in setting of Di-Di Twins  #Labor: Progressing well. Pitocin started at 1230. AROM for clear fluid at 2115. Continue expectant mgmt. Will plan for delivery in room with [place] at bedside.   #Gestational HTN: normotensive to MR pressures since admit. PEC labs normal overall. Continue to monitor. #Pain: epidural #FWB: cat I  #GBS negative   Janet Berlin, MD 9:30 PM

## 2020-06-07 ENCOUNTER — Encounter (HOSPITAL_COMMUNITY): Payer: Self-pay | Admitting: Obstetrics & Gynecology

## 2020-06-07 ENCOUNTER — Encounter (HOSPITAL_COMMUNITY): Payer: Medicaid Other

## 2020-06-07 DIAGNOSIS — O30043 Twin pregnancy, dichorionic/diamniotic, third trimester: Secondary | ICD-10-CM

## 2020-06-07 DIAGNOSIS — O134 Gestational [pregnancy-induced] hypertension without significant proteinuria, complicating childbirth: Secondary | ICD-10-CM

## 2020-06-07 DIAGNOSIS — Z3A36 36 weeks gestation of pregnancy: Secondary | ICD-10-CM

## 2020-06-07 DIAGNOSIS — Z3A00 Weeks of gestation of pregnancy not specified: Secondary | ICD-10-CM | POA: Diagnosis not present

## 2020-06-07 DIAGNOSIS — O43899 Other placental disorders, unspecified trimester: Secondary | ICD-10-CM | POA: Diagnosis not present

## 2020-06-07 LAB — CBC
HCT: 23.2 % — ABNORMAL LOW (ref 36.0–46.0)
Hemoglobin: 7.5 g/dL — ABNORMAL LOW (ref 12.0–15.0)
MCH: 22.7 pg — ABNORMAL LOW (ref 26.0–34.0)
MCHC: 32.3 g/dL (ref 30.0–36.0)
MCV: 70.3 fL — ABNORMAL LOW (ref 80.0–100.0)
Platelets: 230 10*3/uL (ref 150–400)
RBC: 3.3 MIL/uL — ABNORMAL LOW (ref 3.87–5.11)
RDW: 16.3 % — ABNORMAL HIGH (ref 11.5–15.5)
WBC: 9.7 10*3/uL (ref 4.0–10.5)
nRBC: 0 % (ref 0.0–0.2)

## 2020-06-07 LAB — PREPARE RBC (CROSSMATCH)

## 2020-06-07 LAB — RPR: RPR Ser Ql: NONREACTIVE

## 2020-06-07 MED ORDER — ONDANSETRON HCL 4 MG PO TABS: 4.0000 mg | ORAL_TABLET | ORAL | Status: DC | PRN

## 2020-06-07 MED ORDER — SENNOSIDES-DOCUSATE SODIUM 8.6-50 MG PO TABS: 2.0000 | ORAL_TABLET | ORAL | Status: DC

## 2020-06-07 MED ORDER — COCONUT OIL OIL: 1.0000 "application " | TOPICAL_OIL | Status: DC | PRN

## 2020-06-07 MED ORDER — SODIUM CHLORIDE 0.9% IV SOLUTION: Freq: Once | INTRAVENOUS | Status: AC

## 2020-06-07 MED ORDER — BENZOCAINE-MENTHOL 20-0.5 % EX AERO
1.0000 "application " | INHALATION_SPRAY | CUTANEOUS | Status: DC | PRN
  Filled 2020-06-07: qty 56

## 2020-06-07 MED ORDER — LACTATED RINGERS IV SOLN: INTRAVENOUS | Status: DC

## 2020-06-07 MED ORDER — SIMETHICONE 80 MG PO CHEW: 80.0000 mg | CHEWABLE_TABLET | ORAL | Status: DC | PRN

## 2020-06-07 MED ORDER — DOCUSATE SODIUM 100 MG PO CAPS
100.0000 mg | ORAL_CAPSULE | Freq: Two times a day (BID) | ORAL | Status: DC
  Administered 2020-06-07 – 2020-06-08 (×2): 100 mg via ORAL
  Filled 2020-06-07 (×2): qty 1

## 2020-06-07 MED ORDER — OXYCODONE HCL 5 MG PO TABS
5.0000 mg | ORAL_TABLET | ORAL | Status: DC | PRN
  Administered 2020-06-07 (×3): 5 mg via ORAL
  Filled 2020-06-07 (×3): qty 1

## 2020-06-07 MED ORDER — ACETAMINOPHEN 10 MG/ML IV SOLN
1000.0000 mg | Freq: Four times a day (QID) | INTRAVENOUS | Status: AC
  Administered 2020-06-08 (×2): 1000 mg via INTRAVENOUS
  Filled 2020-06-07 (×4): qty 100

## 2020-06-07 MED ORDER — MISOPROSTOL 200 MCG PO TABS
800.0000 ug | ORAL_TABLET | Freq: Once | ORAL | Status: AC
  Administered 2020-06-07: 800 ug via RECTAL
  Filled 2020-06-07: qty 4

## 2020-06-07 MED ORDER — METOCLOPRAMIDE HCL 10 MG PO TABS
10.0000 mg | ORAL_TABLET | Freq: Once | ORAL | Status: AC
  Administered 2020-06-07: 10 mg via ORAL
  Filled 2020-06-07: qty 1

## 2020-06-07 MED ORDER — DIBUCAINE (PERIANAL) 1 % EX OINT
1.0000 | TOPICAL_OINTMENT | CUTANEOUS | Status: DC | PRN
Start: 2020-06-07 — End: 2020-06-09

## 2020-06-07 MED ORDER — PRENATAL MULTIVITAMIN CH
1.0000 | ORAL_TABLET | Freq: Every day | ORAL | Status: DC
  Administered 2020-06-08: 1 via ORAL
  Filled 2020-06-07: qty 1

## 2020-06-07 MED ORDER — IBUPROFEN 600 MG PO TABS
600.0000 mg | ORAL_TABLET | Freq: Four times a day (QID) | ORAL | Status: DC
  Administered 2020-06-07 – 2020-06-08 (×5): 600 mg via ORAL
  Filled 2020-06-07 (×5): qty 1

## 2020-06-07 MED ORDER — FERROUS SULFATE 325 (65 FE) MG PO TABS: 325.0000 mg | ORAL_TABLET | ORAL | Status: DC

## 2020-06-07 MED ORDER — ACETAMINOPHEN 325 MG PO TABS
650.0000 mg | ORAL_TABLET | ORAL | Status: DC | PRN
  Administered 2020-06-07: 650 mg via ORAL
  Filled 2020-06-07: qty 2

## 2020-06-07 MED ORDER — DIPHENHYDRAMINE HCL 25 MG PO CAPS: 25.0000 mg | ORAL_CAPSULE | Freq: Four times a day (QID) | ORAL | Status: DC | PRN

## 2020-06-07 MED ORDER — FAMOTIDINE 20 MG PO TABS
40.0000 mg | ORAL_TABLET | Freq: Once | ORAL | Status: AC
  Administered 2020-06-07: 40 mg via ORAL
  Filled 2020-06-07: qty 2

## 2020-06-07 MED ORDER — ONDANSETRON HCL 4 MG/2ML IJ SOLN: 4.0000 mg | INTRAMUSCULAR | Status: DC | PRN

## 2020-06-07 MED ORDER — WITCH HAZEL-GLYCERIN EX PADS: 1.0000 "application " | MEDICATED_PAD | CUTANEOUS | Status: DC | PRN

## 2020-06-07 MED ORDER — TETANUS-DIPHTH-ACELL PERTUSSIS 5-2.5-18.5 LF-MCG/0.5 IM SUSY: 0.5000 mL | PREFILLED_SYRINGE | Freq: Once | INTRAMUSCULAR | Status: DC

## 2020-06-07 NOTE — Progress Notes (Signed)
Called back to patient room by husband in the hallway. Husband stated " she is bleeding heavily and it is running down her leg" Entered the room to find the patient standing at the bedside in lots of pain. Got the patient to lay down for assessment and yellow pad was partially saturated. Performed fundal rub. Patient is still firm and off to the left with clots hanging down. Patient is still cramping. No orders for liquid pain meds have been ordered. At patient request crushed pain meds and given. K pad set up and given to patient. Waiting on MDs to assess patients.

## 2020-06-07 NOTE — Progress Notes (Signed)
Entered the room to check patient after husband called the desk saying mom was bleeding very heavily. Got patient laid back for a fundal rub. Chuck pad was saturated with blood and yellow pad contained long, ping pong ball clots. Fundal rubbed the patient several times. Firm and at [place] but off to the left. No active bleeding was seen but some visible clotting still coming from the vagina.  Called in another RN to assist with changing the patient due to the patient being in high pain levels. After speaking with Md the patient was rounded on and BTL was moved to tomorrow at 2pm. MD ordered to remove foley and saline lock patient. Patient now allowed to eat until 5/20 at 0600. Requested liquid Po meds from MD due to pt being unable to swallow pills. All meds to the point have had to be crushed. Patient in pain but settled in the bed will reassess.

## 2020-06-07 NOTE — Lactation Note (Signed)
This note was copied from a baby's chart. Lactation Consultation Note  Patient Name: Misty Murillo [id number] Date: 06/07/2020 Reason for consult: Initial assessment;Mother's request;Difficult latch;1st time breastfeeding;Late-preterm 34-36.6wks;Multiple gestation;Maternal endocrine disorder Age:29 hours   Infant cueing but not able to latch at the breast. Mom large breasts and infant not able to open wide enough to latch to breasts. 20 NS used with curve tip but he did a lot of tongue thrusting. Infant last feeding 10 am took 6 ml via bottle. Mom stated some leakage with yellow slow flow nipple.   Baby A Infant then took 6 ml of formula with paced bottle feeding with extra slow flow nipple. No leakage noted.  Infant still cueing given 4 ml with paced bottle feeding.  Infant small emesis 1 ml.    Plan 1. To feed based on cues 8-12x in 24 hr period no more than 3 hrs without an attempt. LC went over how to reduce calorie loss for LPTI including keeping total feeding under 30 minutes.         2. Mom to pace bottle feed after each attempt at the breasts based on LPTI supplementation guidelines. Mom to offer half burp him then try little more.            3. Mom to pump using DEBP q 3 hrs for 15 minutes.             4. I and O sheet reviewed.             5 LC brochure of inpatient and oupatient services reviewed.  All questions answered at the end of the visit.   With next feeding, infant tolerate feeding with curve tip and finger feeding until able to coordinate suck a little better.     Maternal Data Has patient been taught Hand Expression?: Yes Does the patient have breastfeeding experience prior to this delivery?: No  Feeding Mother's Current Feeding Choice: Breast Milk and Formula  LATCH Score Latch: Repeated attempts needed to sustain latch, nipple held in mouth throughout feeding, stimulation needed to elicit sucking reflex.  Audible Swallowing: None  Type of Nipple: Flat (will  evert but soft and compressible and large)  Comfort (Breast/Nipple): Soft / non-tender  Hold (Positioning): Assistance needed to correctly position infant at breast and maintain latch.  LATCH Score: 5   Lactation Tools Discussed/Used Tools: Nipple Shields Nipple shield size: 20  Interventions Interventions: Breast feeding basics reviewed;DEBP;Adjust position;Assisted with latch;Support pillows;Skin to skin;Position options;Education;Breast massage;Expressed milk;Hand express;Breast compression;Pre-pump if needed  Discharge Pump: Manual  Consult Status Consult Status: Follow-up Date: 06/08/20 Follow-up type: In-patient    Misty Murillo  Nicholson-Springer 06/07/2020, 5:27 PM

## 2020-06-07 NOTE — Lactation Note (Signed)
This note was copied from a baby's chart. Lactation Consultation Note  Patient Name: Misty Murillo [id number] Date: 06/07/2020   Age:29 hours  RN in room; lactation to visit later. Twins have been supplemented with [place].    Matthias [facility] 06/07/2020, 8:23 AM

## 2020-06-07 NOTE — Lactation Note (Signed)
This note was copied from a baby's chart. Lactation Consultation Note  Patient Name: Misty Murillo [id number] Date: 06/07/2020 Reason for consult: Initial assessment;Mother's request;Difficult latch;1st time breastfeeding;Late-preterm 34-36.6wks;Multiple gestation;Maternal endocrine disorder Age:29 hours   [name] with suck training did latch at the breast but not able to coordinate a suck for a long period of time.  With 20 NS and curve tip, he latched and transferred 0.65ml of formula through the shield followed by 5 minutes of nursing at the breasts.   LC tried paced bottle feeding with extra slow flow nipple, he did a lot of tongue thrusting. With finger feeding and curve tip he took another 1ml.  LC called RN, Maryann Conners to assist Mom with curve tip finger feeding. RN reported he took very little.   Mom to keep infant STS and if he cues try latching at the breast with breast compression.   Maternal Data    Feeding Mother's Current Feeding Choice: Breast Milk and Formula  LATCH Score Latch: Repeated attempts needed to sustain latch, nipple held in mouth throughout feeding, stimulation needed to elicit sucking reflex.  Audible Swallowing: A few with stimulation  Type of Nipple: Flat  Comfort (Breast/Nipple): Soft / non-tender  Hold (Positioning): Assistance needed to correctly position infant at breast and maintain latch.  LATCH Score: 6   Lactation Tools Discussed/Used Tools: Nipple Shields Nipple shield size: 20  Interventions Interventions: Breast feeding basics reviewed;Adjust position;DEBP;Assisted with latch;Support pillows;Skin to skin;Position options;Education;Breast massage;Expressed milk;Breast compression;Pre-pump if needed;Hand express  Discharge Pump: Manual  Consult Status Consult Status: Follow-up Date: 06/08/20 Follow-up type: In-patient    Jean Skow  Nicholson-Springer 06/07/2020, 5:40 PM

## 2020-06-07 NOTE — Discharge Instructions (Signed)

## 2020-06-07 NOTE — Progress Notes (Signed)
OB/GYN Faculty Attending Note  Post Partum Day 0  Subjective: Patient is feeling a significant amount of pain in her uterus, back and vaginal area. Minimal improvement with tylenol. She is not ambulating and denies light-headedness or dizziness. She is not passing flatus. She is NPO for tubal, denies nausea/vomiting. Bleeding is moderate. She would like to breast feed, lactation has not been by. Babies are in room and doing well.  Objective: Blood pressure 124/89, pulse 81, temperature 98.2 F (36.8 C), temperature source Oral, resp. rate 18, last menstrual period 09/25/2019, SpO2 100 %, unknown if currently breastfeeding. Temp:  [98.2 F (36.8 C)-99 F (37.2 C)] 98.2 F (36.8 C) (05/19 0740) Pulse Rate:  [76-139] 81 (05/19 0740) Resp:  [16-18] 18 (05/19 0740) BP: (113-142)/(71-97) 124/89 (05/19 0740) SpO2:  [99 %-100 %] 100 % (05/19 0740)  Physical Exam:  General: alert, oriented, cooperative Chest: normal respiratory effort Heart: RRR  Abdomen: soft, appropriately tender to palpation  Uterine Fundus: firm, at umbilicus, tender to touch, large fibroid present on fundal left Lochia: moderate, rubra DVT Evaluation: no evidence of DVT Extremities: no edema, no calf tenderness  UOP: foley catheter in place  Recent Labs    06/06/20 1039 06/06/20 1816  HGB 9.3* 9.5*  HCT 28.4* 29.2*    Assessment/Plan: Patient Active Problem List   Diagnosis Date Noted  . Vaginal delivery 06/07/2020  . Gestational hypertension 06/06/2020  . Cervix, short (affecting pregnancy) 02/29/2020  . Pneumonia due to COVID-19 virus 02/14/2020  . Sepsis ([place]) 02/14/2020  . Acute hypoxemic respiratory failure ([place]) 02/14/2020  . Intractable nausea and vomiting 02/14/2020  . Diarrhea 02/14/2020  . Hypokalemia 01/17/2020  . Supervision of high risk pregnancy, antepartum 12/12/2019  . Fibroid   . Dichorionic diamniotic twin gestation 07/2019  . Obesity in pregnancy 07/2019  . Graves' disease  08/03/2018  . Hyperthyroidism 03/08/2018  . Vitamin D deficiency 03/08/2018  . Chronic urticaria 07/11/2017  . Angioedema 07/11/2017  . Sickle cell trait ([place]) 05/26/2017  . Liver mass, left lobe 05/18/2017    Patient is 30 y.o. [id number] PPD#0 s/p SVD at [redacted]w[redacted]d of twins. Course complicated by significant uterine and lower back pain. She is doing okay, concerned about tubal due to pain. Reviewed surgery and briefly reviewed risks associated with surgery. Uterus tender to touch on fundal fibroid, suspect involuting fibroid. Pain meds added. Patient prefers to reschedule postpartum tubal for tomorrow.   Continue routine post partum care NPO at midnight Pain meds prn Regular diet PPBTL for birth control scheduled for tomorrow   K. Arvilla Meres, MD, [place] [facility] [employer] (Faculty Practice)  06/07/2020, 9:30 AM

## 2020-06-07 NOTE — Progress Notes (Signed)
MDs called to come assess patient. No new orders. Will continue to monitor.

## 2020-06-07 NOTE — Lactation Note (Signed)
This note was copied from a baby's chart. Lactation Consultation Note  Patient Name: Misty Murillo [id number] Date: 06/07/2020   Age:29 hours   LC on the way to evaluate feeding for twins and was alerted by RN, Maryann Conners, infants had a feeding/ resting at this time. I will return to check on their feeding progress at next feeding.   Maternal Data    Feeding Nipple Type: Slow - flow  LATCH Score                    Lactation Tools Discussed/Used    Interventions    Discharge    Consult Status      Misty Luster  Murillo 06/07/2020, 11:14 AM

## 2020-06-07 NOTE — Social Work (Signed)
CSW received consult for hx of Anxiety and Depression. CSW met with MOB to offer support and complete assessment.    CSW met with MOB at bedside. CSW congratulated MOB and FOB. CSW observed MOB lying in bed with the support of FOB and maternal grandmother bottle feeding the infants. CSW offered to return at a different time. MOB welcoming of CSW visit. CSW explain the reason for the visit. MOB presented pleasant, calm and receptive to CSW. CSW inquired how MOB feels since giving birth. MOB reports feeling tired but making it. MOB reports she was sick the first and second trimester and when her appetite improved during the third trimester she felt better.   CSW inquired about MOB history of depression and anxiety. MOB denies history of depression however feels she is an anxious person at baseline. CSW inquired if her anxiousness interferes with her activities of daily living. MOB denies interference with activities of daily living. MOB denies therapy or medication treatment. CSW inquired about MOB coping skills during stressful situations. MOB reports she listens to music and take walks. CSW praised MOB coping skills. CSW provided education regarding the baby blues period vs. perinatal mood disorders, discussed treatment and gave resources for mental health follow up. CSW recommended MOB complete a self-evaluation during the postpartum time period using the New Mom Checklist from Postpartum Progress and encouraged MOB to contact a medical professional if symptoms are noted. MOB receptive to the resources provided. CSW assessed MOB for safety. MOB denies thoughts of harm to self and others. CSW inquired about MOB supports. MOB acknowledges her significant other, dad and mom as supports.   CSW provided review of Sudden Infant Death Syndrome (SIDS) precautions and informed MOB no-co sleeping with the infant's.  MOB reports the infants will sleep in a double bassinet. CSW inquired if MOB has all essential items for  the infants. MOB reports she has all essential items for the infant including a car seat. MOB has chosen [facility]. CSW assessed MOB for additional needs. MOB reports no further need.    CSW identifies no further need for intervention and no barriers to discharge at this time.  Mayana Irigoyen, MSW, LCSW [facility]  Clinical Social Worker  [phone] 06/07/2020  11:07 AM 

## 2020-06-07 NOTE — Progress Notes (Signed)
Pt called RN to room because she passed a large clot in the toilet. Clot about the size of a softball. Dr. Berniece Andreas notified, order for 800 mcg cyctotec to be given rectally. Will closely monitor pt.

## 2020-06-07 NOTE — Discharge Summary (Signed)
Postpartum Discharge Summary     Patient Name: Misty Murillo DOB: [birth year] MRN: [id number]  Date of admission: 06/06/2020 Delivery date:   Cameshia, Cressman [[id number]]  06/07/2020    Gaetana, Kawahara [[id number]]  06/07/2020   Delivering provider:    Kathe Mariner [[id number]]  Lynnae, Ludemann Imogene [[id number]]  Sharene Skeans M   Date of discharge: 06/09/2020  Admitting diagnosis: Gestational hypertension [O13.9] Intrauterine pregnancy: [redacted]w[redacted]d    Secondary diagnosis:  Active Problems:   Sickle cell trait (HHiltonia   Dichorionic diamniotic twin gestation   Pneumonia due to COVID-19 virus   Gestational hypertension   Vaginal delivery  Additional problems: none    Discharge diagnosis: Preterm Pregnancy Delivered, Gestational Hypertension and Anemia                                              Post partum procedures:postpartum tubal ligation not performed d/t abnormal pp bleeding, pt will have internal tubal and reports  Augmentation: AROM and Pitocin Complications: None  Hospital course: Induction of Labor With Vaginal Delivery   30y.o. yo G3P1011 at 345w4das admitted to the hospital 06/06/2020 for induction of labor.  Indication for induction: Gestational hypertension.  Patient had an uncomplicated labor course as follows: Membrane Rupture Time/Date:    LeAvina, Eberle0[[id number]]9:15 PM    LeLupita DawnrSonoita0[[id number]]4:06 AM  ,   LeLindaann, Gradilla0[[id number]]06/06/2020    LeVickye, Astorino0[[id number]]06/07/2020    Delivery Method:   LeKathe Mariner0[[id number]]Vaginal, Spontaneous    [facility][[id number]]Vaginal, Vacuum (Extractor)   Episiotomy:    LeBraidyn, Peace0[[id number]]None    LeVernadine, CoombsrHooper0[[id number]]None   Lacerations:     LeNekeisha, Aure0[[id number]]Periurethral    LeJosaphine, Shimamoto0[[id number]]Periurethral   Details of delivery can be found in separate delivery note.  Patient had a  routine postpartum course. Patient is discharged home 06/09/20.  Newborn Data: Birth date:   LeTajuana, Kniskern0[[id number]]06/07/2020    LeDaniel, Johndrow0[[id number]]06/07/2020   Birth time:   LeKeyasia, Jolliff0[[id number]]4:01 AM    LeVira Blanco0[[id number]]4:11 AM   Gender:   LeElham, Fini0[[id number]]Female    LeWyllow, SeiglerrGreenville0[[id number]]Female   Living status:   LeAdreonna, Yontz0[[id number]]Living    LeCashe, GattrLake Camelot0[[id number]]Living   Apgars:   LeKiandra, Sanguinetti0[[id number]]9 3 [place] Driver[place]0[[id number]]  4  ,   VOP, FYTW KMQKMM0[[id number]]9    LeAreebah, Meind[place]0[[id number]]9   Weight:   LeAtlas, Kuc0[[id number]]  6004    LeDiamond, Jentz0[[id number]]2084 g    Magnesium Sulfate received: No BMZ received: No Rhophylac:N/A MMR:N/A T-DaP: offered postpartum Flu: No Transfusion:No  Physical exam  Vitals:   06/08/20 0515 06/08/20 1456 06/08/20 2127 06/09/20 0522  BP: 113/78 135/89 126/90 128/84  Pulse: 83 86 90 88  Resp: '18 18 18 18  ' Temp: 98 F (36.7 C) 98.3 F (36.8 C) 98.3 F (36.8 C) 98 F (36.7 C)  TempSrc:  Oral Oral Oral  SpO2: 100% 100% 99% 98%   General: alert, cooperative and no distress Lochia: appropriate Uterine Fundus: firm Incision: N/A DVT Evaluation: No evidence of DVT seen on physical exam. Negative Homan's sign. No cords or calf tenderness. Calf/Ankle edema is present Labs: Lab Results  Component Value Date   WBC 10.8 (H) 06/09/2020   HGB 7.6 (L) 06/09/2020   HCT 22.8 (L) 06/09/2020   MCV 71.5 (L) 06/09/2020   PLT 258 06/09/2020  Consulted with Dr. Rip Harbour on hgb and pt hx this visit with PRBCs and Venofer, pt OK to be discharged home on oral iron.   CMP Latest Ref Rng & Units 06/06/2020  Glucose 70 - 99 mg/dL 95  BUN 6 - 20 mg/dL <5(L)  Creatinine 0.44 - 1.00 mg/dL 0.66  Sodium 135 - 145 mmol/L 136  Potassium 3.5 - 5.1 mmol/L 3.6  Chloride 98 - 111 mmol/L 106  CO2 22 - 32  mmol/L 22  Calcium 8.9 - 10.3 mg/dL 8.9  Total Protein 6.5 - 8.1 g/dL 6.0(L)  Total Bilirubin 0.3 - 1.2 mg/dL 0.5  Alkaline Phos 38 - 126 U/L 282(H)  AST 15 - 41 U/L 47(H)  ALT 0 - 44 U/L 31   Edinburgh Score: Edinburgh Postnatal Depression Scale Screening Tool 06/08/2020  I have been able to laugh and see the funny side of things. 0  I have looked forward with enjoyment to things. 0  I have blamed myself unnecessarily when things went wrong. 0  I have been anxious or worried for no good reason. 1  I have felt scared or panicky for no good reason. 0  Things have been getting on top of me. 1  I have been so unhappy that I have had difficulty sleeping. 0  I have felt sad or miserable. 0  I have been so unhappy that I have been crying. 0  The thought of harming myself has occurred to me. 0  Edinburgh Postnatal Depression Scale Total 2     After visit meds:  Allergies as of 06/09/2020   No Known Allergies     Medication List    TAKE these medications   aspirin EC 81 MG tablet Take 1 tablet (81 mg total) by mouth daily. Take after 12 weeks for prevention of preeclampsia later in pregnancy   Blood Pressure Monitoring Devi 1 each by Does not apply route once a week.   butalbital-acetaminophen-caffeine 50-325-40 MG tablet Commonly known as: FIORICET Take 2 tablets by mouth every 12 (twelve) hours as needed for headache.   Comfort Fit Maternity Supp Lg Misc 1 application by Does not apply route daily.   Medical Compression Socks Misc Wear daily as needed for lower leg swelling   ferrous sulfate 325 (65 FE) MG tablet Take 1 tablet (325 mg total) by mouth every other day.   hydrochlorothiazide 25 MG tablet Commonly known as: HYDRODIURIL Take 1 tablet (25 mg total) by mouth daily. To begin postpartum   ibuprofen 800 MG tablet Commonly known as: ADVIL Take 1 tablet (800 mg total) by mouth 3 (three) times daily.   ondansetron 4 MG disintegrating tablet Commonly known as:  Zofran ODT Take 1 tablet (4 mg total) by mouth every 8 (eight) hours as needed for nausea or vomiting.   pantoprazole 40 MG tablet Commonly known as: Protonix Take 1 tablet (40 mg total) by mouth daily.   PEPCID PO Take by mouth.   potassium chloride SA 20 MEQ tablet Commonly  known as: KLOR-CON Take 2 tablets (40 mEq total) by mouth 2 (two) times daily.   prenatal multivitamin Tabs tablet Take 1 tablet by mouth daily at 12 noon.   progesterone 200 MG capsule Commonly known as: PROMETRIUM Place 1 capsule (200 mg total) vaginally at bedtime.        Discharge home in stable condition Infant Feeding: Bottle and Breast Infant Disposition:home with mother Discharge instruction: per After Visit Summary and Postpartum booklet. Activity: Advance as tolerated. Pelvic rest for 6 weeks.  Diet: routine diet Future Appointments: Future Appointments  Date Time Provider [place]  06/15/2020 12:10 PM [facility] NURSE [facility]  07/12/2020 11:50 AM Roma Schanz, CNM [facility]   Follow up Visit:  Follow-up Information    [facility] Follow up.   Specialty: Obstetrics and Gynecology Why: in 1 week for BP check and 4 weeks for postpartum exam Contact information: [address]               Please schedule this patient for a In person postpartum visit in 4 weeks with the following provider: Any provider. Additional Postpartum F/U:BP check 1 week; pr Dr. Elonda Husky, pt given prescription for HCTZ to start PP, no additional BP med started and no diuretic given as patient will start HCTZ upon discharge from hospital today High risk pregnancy complicated by: HTN and di/di twins Delivery mode:     Curtistine, Pettitt [[id number]]  Vaginal, Spontaneous    Curtina, Grills [[id number]]  Vaginal, Vacuum (Extractor)   Anticipated Birth Control:  BTL done PP, interval, pt reports would like to have  tubal performed within the next month. Pt offered and declines Nexplanon, Depo and POPs in hospital today, advised to abstain from intercourse until BTL is performed.  Patient also has endocrinologist, no referral for Grave's Disease needed at this time. Patient reports per endocrinology she will check in 3 months after delivery.  06/09/2020 Clarisa Fling, NP

## 2020-06-07 NOTE — Progress Notes (Signed)
Labor Progress Note Misty Murillo is a 30 y.o. G3P1011 at [redacted]w[redacted]d presented for IOL for gestational HTN.   S: Feeling comfy w epidural  O:  BP 122/87   Pulse 80   Temp 98.5 F (36.9 C) (Oral)   Resp 18   LMP 09/25/2019   SpO2 100%  EFM: Twin A: 130/mod var/pos accel/no decel  Twin B: 140/mod var/pos accel/no decel  CVE: Dilation: 4.5 Effacement (%): 80 Cervical Position: Posterior Station: -2 Presentation: Vertex Exam by:: Cassie Freer, RN   A&P: 30 y.o. [id number] [redacted]w[redacted]d here for IOL for gHTN in setting of Di-Di Twins  #Labor: Progressing well. Pitocin started at 1230. AROM for clear fluid at 2115. Patient now complete but zero station, not feeling pressure. Will labor down for one hour and then start pushing. Will plan for delivery in room with [place] at bedside.   #Gestational HTN: normotensive to MR pressures since admit. PEC labs normal overall. Continue to monitor. #Pain: epidural #FWB: cat I  #GBS negative   Janet Berlin, MD 2:11 AM

## 2020-06-08 ENCOUNTER — Other Ambulatory Visit (HOSPITAL_COMMUNITY): Payer: Medicaid Other

## 2020-06-08 ENCOUNTER — Encounter (HOSPITAL_COMMUNITY)
Admission: AD | Disposition: A | Discharge status: Home / Self Care | Payer: Self-pay | Source: Home / Self Care | Attending: Obstetrics & Gynecology

## 2020-06-08 LAB — TYPE AND SCREEN
ABO/RH(D): O POS
Antibody Screen: NEGATIVE
Unit division: 0

## 2020-06-08 LAB — CULTURE, BETA STREP (GROUP B ONLY): Strep Gp B Culture: POSITIVE — AB

## 2020-06-08 LAB — CBC
HCT: 26.3 % — ABNORMAL LOW (ref 36.0–46.0)
Hemoglobin: 8.8 g/dL — ABNORMAL LOW (ref 12.0–15.0)
MCH: 24.1 pg — ABNORMAL LOW (ref 26.0–34.0)
MCHC: 33.5 g/dL (ref 30.0–36.0)
MCV: 72.1 fL — ABNORMAL LOW (ref 80.0–100.0)
Platelets: 283 10*3/uL (ref 150–400)
RBC: 3.65 MIL/uL — ABNORMAL LOW (ref 3.87–5.11)
RDW: 17.6 % — ABNORMAL HIGH (ref 11.5–15.5)
WBC: 11.1 10*3/uL — ABNORMAL HIGH (ref 4.0–10.5)
nRBC: 0 % (ref 0.0–0.2)

## 2020-06-08 LAB — BPAM RBC
Blood Product Expiration Date: 202206152359
ISSUE DATE / TIME: 202205191349
Unit Type and Rh: 5100

## 2020-06-08 SURGERY — LIGATION, FALLOPIAN TUBE, POSTPARTUM
Anesthesia: Choice | Laterality: Bilateral

## 2020-06-08 MED ORDER — SODIUM CHLORIDE 0.9 % IV SOLN
500.0000 mg | Freq: Once | INTRAVENOUS | Status: AC
  Administered 2020-06-08: 500 mg via INTRAVENOUS
  Filled 2020-06-08: qty 25

## 2020-06-08 MED ORDER — OXYCODONE HCL 5 MG/5ML PO SOLN: 5.0000 mg | ORAL | Status: DC | PRN

## 2020-06-08 MED ORDER — IBUPROFEN 100 MG/5ML PO SUSP
600.0000 mg | Freq: Four times a day (QID) | ORAL | Status: DC
  Administered 2020-06-08 – 2020-06-09 (×4): 600 mg via ORAL
  Filled 2020-06-08 (×4): qty 30

## 2020-06-08 MED ORDER — FERROUS SULFATE 300 (60 FE) MG/5ML PO SYRP
300.0000 mg | ORAL_SOLUTION | ORAL | Status: DC
  Filled 2020-06-08: qty 5

## 2020-06-08 MED ORDER — COMPLETENATE 29-1 MG PO CHEW
1.0000 | CHEWABLE_TABLET | Freq: Every day | ORAL | Status: DC
  Administered 2020-06-09: 1 via ORAL
  Filled 2020-06-08: qty 1

## 2020-06-08 MED ORDER — SODIUM CHLORIDE 0.9 % IV SOLN: INTRAVENOUS | Status: DC

## 2020-06-08 NOTE — Lactation Note (Signed)
This note was copied from a baby's chart. Lactation Consultation Note  Patient Name: Misty Murillo [id number] Date: 06/08/2020 Reason for consult: Follow-up assessment;Infant weight loss;Multiple gestation;Infant < 6lbs;Late-preterm 34-36.6wks;1st time breastfeeding;Maternal endocrine disorder Age:29 hours  Visited with mom of 32 hours old LPI twins; she's a P2 but this is her first time BF, her first baby is now 1 y.o. Mom hasn't been feeling well, she voiced she's been sick almost her entire pregnancy and she had to have a transfusion while at the hospitals, babies are being mainly formula feeding so far, but she has been set up with a DEBP, but due to her condition she hasn't been pumping consistently.  Explained to mom the importance of consistent pumping for the onset of lactogenesis II. She was fit with a NS # 20 yesterday due to flat nipples, but mom hasn't been using it (since she's not taking babies to breast). However her plan is to start latching consistently once her milk comes in. Reviewed normal LPI behavior, cluster feeding, feeding cues, size of baby's stomach, supplementation guidelines for LPI's and LPI policy.  Both babies are on a Dr. Owens Shark premie nipple and Similac 22 calorie formula and per mom the bottle feedings are going well. She's aware she can ask for assistance for breastfeeding and pumping as well. Even though babies are not going to breast yet, noticed that mom has a wide gap between her frontal incisors, a possible indicator of a lip tie.   Feeding plan:  1. Encouraged mom to continue doing STS as much as possible, she was doing it when entered the room; praised her for her efforts. She'll attempt to latch babies at the breast when cueing. 2. Pumping every 3 hours was also strongly encouraged 3. Family will continue supplementing babies with EBM/Similac 22 calorie formula; they're aware that volumes will increase again once babies turn 26 hours old  GOB (maternal)  present and supportive. Family reported all questions and concerns were answered, they're both aware of [place] OP services and will call PRN.  Maternal Data    Feeding Mother's Current Feeding Choice: Breast Milk and Formula Nipple Type: Dr. Clement Husbands  Lactation Tools Discussed/Used Tools: Pump Breast pump type: Double-Electric Breast Pump;Manual Pump Education: Setup, frequency, and cleaning;Milk Storage Reason for Pumping: LPI twin < 5 lbs Pumping frequency: q 3 hours (but mom only pumped twice today) Pumped volume: 0 mL  Interventions Interventions: Breast feeding basics reviewed;DEBP;Education  Discharge Pump: Manual;DEBP WIC Program: Yes  Consult Status Consult Status: Follow-up Date: 06/09/20 Follow-up type: [place] 06/08/2020, 6:04 PM

## 2020-06-08 NOTE — Progress Notes (Signed)
Pt has decided she doesn't want the BTL while she's at the hospital, faculty notified.

## 2020-06-08 NOTE — Anesthesia Postprocedure Evaluation (Signed)
Anesthesia Post Note  Patient: Misty Murillo  Procedure(s) Performed: AN AD HOC LABOR EPIDURAL     Patient location during evaluation: Mother Baby Anesthesia Type: Epidural Level of consciousness: awake and alert Pain management: pain level controlled Vital Signs Assessment: post-procedure vital signs reviewed and stable Respiratory status: spontaneous breathing, nonlabored ventilation and respiratory function stable Cardiovascular status: stable Postop Assessment: no headache, no backache and epidural receding Anesthetic complications: no   No complications documented.  Last Vitals:  Vitals:   06/07/20 1940 06/08/20 0515  BP: (!) 126/95 113/78  Pulse: 86 83  Resp: 18 18  Temp: 36.7 C 36.7 C  SpO2: 100% 100%    Last Pain:  Vitals:   06/08/20 0515  TempSrc:   PainSc: 0-No pain   Pain Goal:                   Rayvon Char

## 2020-06-08 NOTE — Progress Notes (Signed)
POSTPARTUM PROGRESS NOTE  Subjective: Misty Murillo is a 30 y.o. [phone] s/p SVD/VAVD of di di twins at [redacted]w[redacted]d.  She reports she doing well overall. She had a few large, softball size blood clots yesterday and received cytotec. Reports bleeding is much improved today. She is feeling a little short of breath when she walks the halls, but otherwise denies dizzines, faintness. No acute events overnight. She denies any problems with ambulating, voiding or po intake. Denies nausea or vomiting. She has  passed flatus. Pain is moderately controlled.  Lochia is moderate.  Objective: Blood pressure 113/78, pulse 83, temperature 98 F (36.7 C), resp. rate 18, last menstrual period 09/25/2019, SpO2 100 %, unknown if currently breastfeeding.  Physical Exam:  General: alert, cooperative and no distress Chest: no respiratory distress Abdomen: soft, appropriately-tender  Uterine Fundus: firm and at level of umbilicus, palpable fibroid  Extremities: No calf swelling or tenderness  no edema  Recent Labs    06/06/20 1816 06/07/20 1158  HGB 9.5* 7.5*  HCT 29.2* 23.2*    Assessment/Plan: Misty Murillo is a 30 y.o. [id number] s/p SVD/VAVD of didi twins. PPD#1.  Routine Postpartum Care: Doing well, pain well-controlled.  -- Continue routine care, lactation support  -- Contraception: plans to have interval BTL  -- Feeding: breast -- gestational HTN: Dr. Elonda Husky had sent HCTZ script to patient as a postpartum med. Will defer starting med here for now, continue to monitor.  -- acute blood loss anemia: hgb decreased from 9.5 to 7.5 s/p passing of large clots. Received one unit pRBC. Will give IV iron today. F/u AM CBC    Dispo: Plan for discharge PPD#2 .  Janet Berlin, MD OB Fellow, Faculty Practice 06/08/2020 6:22 AM

## 2020-06-09 ENCOUNTER — Ambulatory Visit: Payer: Self-pay

## 2020-06-09 LAB — CBC
HCT: 22.8 % — ABNORMAL LOW (ref 36.0–46.0)
Hemoglobin: 7.6 g/dL — ABNORMAL LOW (ref 12.0–15.0)
MCH: 23.8 pg — ABNORMAL LOW (ref 26.0–34.0)
MCHC: 33.3 g/dL (ref 30.0–36.0)
MCV: 71.5 fL — ABNORMAL LOW (ref 80.0–100.0)
Platelets: 258 10*3/uL (ref 150–400)
RBC: 3.19 MIL/uL — ABNORMAL LOW (ref 3.87–5.11)
RDW: 17.8 % — ABNORMAL HIGH (ref 11.5–15.5)
WBC: 10.8 10*3/uL — ABNORMAL HIGH (ref 4.0–10.5)
nRBC: 0.2 % (ref 0.0–0.2)

## 2020-06-09 MED ORDER — IBUPROFEN 800 MG PO TABS: 800.0000 mg | ORAL_TABLET | Freq: Three times a day (TID) | ORAL | 0 refills | Status: DC

## 2020-06-09 NOTE — Lactation Note (Addendum)
This note was copied from a baby's chart. Lactation Consultation Note  Patient Name: Cressie Betzler NHAFB'X Date: 06/09/2020 Reason for consult: Follow-up assessment;Multiple gestation;Late-preterm 34-36.6wks;Maternal endocrine disorder Age:29 hours  Follow up visit to 36 hours old LPT twins with 6.39% weight loss at the moment. Mother shares she collect 8 mL last pumping session. Mother states twins are  feeding well.   Reviewed pumping with initiation mode. LC fitted for 27-mm flanges, provided shells due to edema, talked about ice and massage.    Feeding plan:  1-Skin to skin 2-Aim for a deep, comfortable latch 3-Breastfeeding on demand or 8-12 times in 24h period. 4-Keep infant awake during breastfeeding session: massaging breast, infant's hand/shoulder/feet 5-Pump and supplement following guidelines, paced bottle feeding and fullness cues.  6-Preserve infant energy limiting feeding sessions to 30 min max.  7-Monitor voids and stools as signs good intake.  8-Encouraged maternal rest, hydration and food intake.  9-Contact LC as needed for feeds/support/concerns/questions   All questions answered at this time.   Feeding Mother's Current Feeding Choice: Breast Milk and Formula Nipple Type: Dr. Clement Husbands  Lactation Tools Discussed/Used Tools: Pump;Flanges;Shells Flange Size: 27 Breast pump type: Double-Electric Breast Pump Reason for Pumping: LPTI Pumping frequency: Q3 Pumped volume: 8 mL  Interventions Interventions: Breast feeding basics reviewed;Assisted with latch;Hand express;Expressed milk;Shells;DEBP;Ice;Education  Consult Status Consult Status: Follow-up Date: 06/10/20 Follow-up type: In-patient    Marcelyn Ruppe A Higuera [place]idey 06/09/2020, 10:59 PM

## 2020-06-10 ENCOUNTER — Ambulatory Visit: Payer: Self-pay

## 2020-06-10 NOTE — Lactation Note (Signed)
This note was copied from a baby's chart. Lactation Consultation Note  Patient Name: Misty Murillo LFYBO'F Date: 06/10/2020 Reason for consult: L&D Initial assessment;Follow-up assessment;Difficult latch;1st time breastfeeding;Late-preterm 34-36.6wks;Infant < 6lbs;Multiple gestation Age:29 hours  Maternal Data    Feeding Mother's Current Feeding Choice: Breast Milk and Formula Nipple Type: Dr. Clement Husbands  LATCH Score       [name] taking 14-22 ml of Simiilac 22 cal/oz and not latching as well at the breast. Infant multiple urine but last stool last night around 10 pm. LC encouraged Mom to offer more EBM to [name] to help with him passing stool. Try offering all bottle for one feeding and breast and bottle at next feeding. Mom to follow LPTI breastfeeding supplementation guide and to offer more when infant not latching at the breasts.   Mom has some areas of dependent edema under arm and base of the breast. Warm moist heat with breast massage provided for her to reduce areas of hardness. Mom also to use her manual pump for comfort.   Mom pumped once today getting 20 ml. Mom aware importance to pump consistently to maintain her milk supply.   Mom to pump q 3 hrs for 15 minutes after feedings.   Mom to call for assistance with next latch. Mom also using her breast shells not pumping, sleeping or nursing to extend her nipples. With last feeding, Mom stated able to latch [name] without use of the 20 NS.   All questions answered at the end of the visit.               Lactation Tools Discussed/Used Tools: Shells;Pump;Flanges Flange Size: 27 Breast pump type: Double-Electric Breast Pump Reason for Pumping: increase stimulation Pumping frequency: every 3 hrs for 15 minutes  Interventions Interventions: Breast feeding basics reviewed;DEBP;Skin to skin;Education;Position options;Expressed milk;Breast massage;Shells;Breast compression;Hand pump  Discharge Discharge  Education: Engorgement and breast care;Warning signs for feeding baby;Outpatient recommendation Pump: [facility]  Consult Status Consult Status: Follow-up Date: 06/11/20 Follow-up type: In-patient    Jolynne Spurgin  Nicholson-Springer 06/10/2020, 11:59 AM

## 2020-06-10 NOTE — Lactation Note (Signed)
This note was copied from a baby's chart. Lactation Consultation Note  Patient Name: Misty Murillo [id number] Date: 06/10/2020 Reason for consult: Follow-up assessment;Mother's request;1st time breastfeeding;Late-preterm 34-36.6wks;Infant < 6lbs;Multiple gestation;Maternal endocrine disorder;Infant weight loss Age:29 hours   Infant Baby A 19-30 ml of Similac 22 cal/oz. Adequate urine and stool output. Mom mostly giving bottles and still working on taking infants to the breasts. Infant just fed prior to [facility] arrival, not able to see a latch.   LC reviewed feeding LPTI keeping total feeding under 30 minutes. Mom to offer breasts and then supplement with EBM first followed by formula according to breastfeeding supplementation guide for LPTI. Mom aware to offer more if infant not going to breast for that feeding. Infant using Nfant or Dr. Owens Shark preemie nipple with instructions on paced bottle feeding and sidelying reviewed by SLP.  [name] taking 14-22 ml of Simiilac 22 cal/oz and not latching as well at the breast. Infant multiple urine but last stool last night around 10 pm. LC encouraged Mom to offer more EBM to [name] to help with him passing stool. Try offering all bottle for one feeding and breast and bottle at next feeding. Mom to follow LPTI breastfeeding supplementation guide and to offer more when infant not latching at the breasts.   Mom has some areas of dependent edema under arm and base of the breast. Warm moist heat with breast massage provided for her to reduce areas of hardness. Mom also to use her manual pump for comfort.   Mom pumped once today getting 20 ml. Mom aware importance to pump consistently to maintain her milk supply.   Mom to pump q 3 hrs for 15 minutes after feedings.   Mom to call for assistance with next latch. Mom also using her breast shells not pumping, sleeping or nursing to extend her nipples. With last feeding, Mom stated able to latch [name] without use of the 20  NS.   All questions answered at the end of the visit.   Maternal Data    Feeding Mother's Current Feeding Choice: Breast Milk and Formula  LATCH Score                    Lactation Tools Discussed/Used Tools: Shells;Pump;Flanges Flange Size: 27 Breast pump type: Double-Electric Breast Pump Reason for Pumping: increase stimulation Pumping frequency: every 3 hrs for 15 minutes  Interventions Interventions: Breast feeding basics reviewed;DEBP;Education;Skin to skin;Coconut oil;Breast compression  Discharge Discharge Education: Engorgement and breast care;Warning signs for feeding baby;Outpatient recommendation;Outpatient Epic message sent  Consult Status Consult Status: Follow-up Date: 06/11/20 Follow-up type: In-patient    Jamon Hayhurst  Nicholson-Springer 06/10/2020, 11:47 AM

## 2020-06-11 ENCOUNTER — Inpatient Hospital Stay (HOSPITAL_COMMUNITY): Payer: Medicaid Other

## 2020-06-11 LAB — SURGICAL PATHOLOGY

## 2020-06-15 ENCOUNTER — Telehealth: Payer: Medicaid Other

## 2020-06-21 ENCOUNTER — Telehealth: Payer: Medicaid Other

## 2020-06-28 ENCOUNTER — Telehealth: Payer: Medicaid Other

## 2020-07-04 ENCOUNTER — Telehealth (INDEPENDENT_AMBULATORY_CARE_PROVIDER_SITE_OTHER): Payer: Medicaid Other

## 2020-07-04 ENCOUNTER — Telehealth: Payer: Medicaid Other

## 2020-07-04 VITALS — BP 125/79 | HR 79

## 2020-07-04 DIAGNOSIS — O133 Gestational [pregnancy-induced] hypertension without significant proteinuria, third trimester: Secondary | ICD-10-CM

## 2020-07-04 DIAGNOSIS — Z0130 Encounter for examination of blood pressure without abnormal findings: Secondary | ICD-10-CM

## 2020-07-04 NOTE — Progress Notes (Addendum)
   I connected with  Misty Murillo on 07/04/20 by a video enabled telemedicine application and verified that I am speaking with the correct person using two identifiers.   I discussed the limitations of evaluation and management by telemedicine. The patient expressed understanding and agreed to proceed.   Provider: Office Patient: Home  NURSE VISIT- BLOOD PRESSURE CHECK  SUBJECTIVE:  Misty Murillo is a 30 y.o. [id number] female here for BP check. She is postpartum, delivery date 06/07/20     HYPERTENSION ROS:  Pregnant/postpartum:  Severe headaches that don't go away with tylenol/other medicines: No  Visual changes (seeing spots/double/blurred vision) No  Severe pain under right breast breast or in center of upper chest No  Severe nausea/vomiting No  Taking medicines as instructed yes  OBJECTIVE:  BP 125/79 (BP Location: Left Arm, Patient Position: Sitting, Cuff Size: Normal)   Pulse 79   LMP 09/25/2019   Breastfeeding Yes   Appearance alert, well appearing, and in no distress and oriented to person, place, and time.  ASSESSMENT: Postpartum  blood pressure check  PLAN: Discussed with Derrill Memo, CNM   Recommendations:  Stop med now, keep BP log    Follow-up:  Monday 6/20, virtual nurse visit    Erna Brossard A Cuahutemoc Attar  07/04/2020 3:17 PM   Chart reviewed for nurse visit. Agree with plan of care. Rec trial of no HCTZ in order to keep milk supply up (breastfeeding twins) and then eval BPs on 07/09/20. Myrtis Ser, CNM 07/04/2020 3:31 PM

## 2020-07-12 ENCOUNTER — Ambulatory Visit: Payer: Medicaid Other | Admitting: Women's Health

## 2020-07-30 ENCOUNTER — Telehealth (INDEPENDENT_AMBULATORY_CARE_PROVIDER_SITE_OTHER): Payer: Medicaid Other | Admitting: Women's Health

## 2020-07-30 ENCOUNTER — Encounter: Payer: Self-pay | Admitting: Women's Health

## 2020-07-30 DIAGNOSIS — E0500 Thyrotoxicosis with diffuse goiter without thyrotoxic crisis or storm: Secondary | ICD-10-CM

## 2020-07-30 DIAGNOSIS — O99285 Endocrine, nutritional and metabolic diseases complicating the puerperium: Secondary | ICD-10-CM

## 2020-07-30 NOTE — Progress Notes (Signed)
TELEHEALTH VIRTUAL POSTPARTUM VISIT ENCOUNTER NOTE Patient name: Misty Murillo MRN [id number]  Date of birth: 05/03/1990  I connected with patient on 07/30/20 at  3:10 PM EDT by MyChart video  and verified that I am speaking with the correct person using two identifiers. Pt is not currently in our office, she is at home.  The provider is in the office.    I discussed the limitations, risks, security and privacy concerns of performing an evaluation and management service by telephone and the availability of in person appointments. I also discussed with the patient that there may be a patient responsible charge related to this service. The patient expressed understanding and agreed to proceed.  Chief Complaint:   Postpartum Care  History of Present Illness:   Misty Murillo is a 30 y.o. [phone] African American female being seen today for a postpartum visit. She is 7 weeks postpartum following a spontaneous vaginal delivery of di-di twins at 8.4 gestational weeks. IOL: yes, for gestational hypertension . Anesthesia: epidural.  Laceration: Rt periurethral.  Complications: delayed increased bleeding, required cytotec and 1u PRBC and IV Venofer for Hgb 9.5 to 7.3. Documented EBL at delivery 142m. Inpatient contraception: no.   Pregnancy complicated by di-di twins, sepsis and pneumonia d/t covid, short cx, Graves disease- needs to call endocrinologist for f/u . Tobacco use: former . Substance use disorder: no. Last pap smear: 03/05/17 and results were NILM w/ HRHPV not done. Next pap smear due: now Patient's last menstrual period was 09/25/2019.  Postpartum course has been uncomplicated. D/C'd on hctz, stopped at bp check. Bleeding  thinks she has started a period . Bowel function is normal. Bladder function is normal. Urinary incontinence? no, fecal incontinence? no Desired contraception: Plans Interval BTL. Patient does not want a pregnancy in the future.  Desired family size is 3 children.   Upstream  - 07/30/20 1518       Pregnancy Intention Screening   Does the patient want to become pregnant in the next year? No    Does the patient's partner want to become pregnant in the next year? No    Would the patient like to discuss contraceptive options today? No      Contraception Wrap Up   Current Method Abstinence    Contraception Counseling Provided No            The pregnancy intention screening data noted above was reviewed. Potential methods of contraception were discussed. The patient elected to proceed with No data recorded.  Edinburgh Postpartum Depression Screening: negative  Edinburgh Postnatal Depression Scale - 07/30/20 1519       Edinburgh Postnatal Depression Scale:  In the Past 7 Days   I have been able to laugh and see the funny side of things. 0    I have looked forward with enjoyment to things. 0    I have blamed myself unnecessarily when things went wrong. 0    I have been anxious or worried for no good reason. 0    I have felt scared or panicky for no good reason. 0    Things have been getting on top of me. 0    I have been so unhappy that I have had difficulty sleeping. 0    I have felt sad or miserable. 0    I have been so unhappy that I have been crying. 0    The thought of harming myself has occurred to me. 0    EFlavia ShipperPostnatal  Depression Scale Total 0            Baby's course has been uncomplicated. Baby is feeding by breast and bottle: milk supply inadequate . Infant has a pediatrician/family doctor? Yes.  Childcare strategy if returning to work/school: n/a-working from home.  Pt has material needs met for her and baby: Yes.   Review of Systems:   Pertinent items are noted in HPI Denies Abnormal vaginal discharge w/ itching/odor/irritation, headaches, visual changes, shortness of breath, chest pain, abdominal pain, severe nausea/vomiting, or problems with urination or bowel movements. Pertinent History Reviewed:  Reviewed past  medical,surgical, obstetrical and family history.  Reviewed problem list, medications and allergies. OB History  Gravida Para Term Preterm AB Living  '3 2 1 1 1 3  ' SAB IAB Ectopic Multiple Live Births  0 1 0 1 3    # Outcome Date GA Lbr Len/2nd Weight Sex Delivery Anes PTL Lv  3A Preterm 06/07/20 77w4d08:05 / 01:56 5 lb 6.1 oz (2.441 kg) M Vag-Spont EPI  LIV     Birth Comments: WNL  3B Preterm 06/07/20 364w4d8:05 / 02:06 4 lb 9.5 oz (2.084 kg) M Vag-Vacuum EPI  LIV     Birth Comments: WNL  2 IAB 2019          1 Term 02/02/10 4161w0d lb 4 oz (2.835 kg) M Vag-Spont EPI  LIV     Birth Comments: IOL for postdates   Physical Assessment:   Vitals:   07/30/20 1518  BP: 124/80  There is no height or weight on file to calculate BMI.         Physical Examination:   General:  Alert, oriented and cooperative.   Mental Status: Normal mood and affect perceived.   Rest of physical exam deferred due to type of encounter       No results found for this or any previous visit (from the past 24 hour(s)).  Assessment & Plan:  1) Postpartum exam 2) 7 wks s/p spontaneous vaginal delivery of di-di twins, IOL for GHTN 3) breast & bottle feeding 4) Depression screening 5) Contraception> plans BTL, schedule pre-op w/ MD 6) S/P acute blood loss anemia> received 1uPRBC and IV Venofer in hospital 7) Graves disease> schedule f/u w/ endocrinologist 8) Due for pap>schedule today  Essential components of care per ACOG recommendations:  1.  Mood and well being:  If positive depression screen, discussed and plan developed.  If using tobacco we discussed reduction/cessation and risk of relapse If current substance abuse, we discussed and referral to local resources was offered.   2. Infant care and feeding:  If breastfeeding, discussed returning to work, pumping, breastfeeding-associated pain, guidance regarding return to fertility while lactating if not using another method. If needed, patient was  provided with a letter to be allowed to pump q 2-3hrs to support lactation in a private location with access to a refrigerator to store breastmilk.   Recommended that all caregivers be immunized for flu, pertussis and other preventable communicable diseases If pt does not have material needs met for her/baby, referred to local resources for help obtaining these.  3. Sexuality, contraception and birth spacing Provided guidance regarding sexuality, management of dyspareunia, and resumption of intercourse Discussed avoiding interpregnancy interval <6mt47mand recommended birth spacing of 18 months  4. Sleep and fatigue Discussed coping options for fatigue and sleep disruption Encouraged family/partner/community support of 4 hrs of uninterrupted sleep to help with mood and fatigue  5. Physical recovery  If pt had a C/S, assessed incisional pain and providing guidance on normal vs prolonged recovery If pt had a laceration, perineal healing and pain reviewed.  If urinary or fecal incontinence, discussed management and referred to PT or uro/gyn if indicated  Patient is safe to resume physical activity. Discussed attainment of healthy weight.  6.  Chronic disease management Discussed pregnancy complications if any, and their implications for future childbearing and long-term maternal health. Review recommendations for prevention of recurrent pregnancy complications, such as 17 hydroxyprogesterone caproate to reduce risk for recurrent PTB not applicable, or aspirin to reduce risk of preeclampsia yes. Pt had GDM: No. If yes, 2hr GTT scheduled: not applicable. Reviewed medications and non-pregnant dosing including consideration of whether pt is breastfeeding using a reliable resource such as LactMed: not applicable Referred for f/u w/ PCP or subspecialist providers as indicated: no  7. Health maintenance Mammogram at 29yo or earlier if indicated Pap smears as indicated  Meds: No orders of the  defined types were placed in this encounter.   Follow-up: Return for 1st available, MD only, Pap & physical & BTL pre-op.   No orders of the defined types were placed in this encounter.    I provided 15 minutes of non-face-to-face time during this encounter.  [place], [facility] 07/30/2020 3:43 PM

## 2020-07-30 NOTE — Patient Instructions (Signed)
Tips To Increase Milk Supply Lots of water! Enough so that your urine is clear Plenty of calories, if you're not getting enough calories, your milk supply can decrease Breastfeed/pump often, every 2-3 hours x 20-30mins Fenugreek 3 pills 3 times a day, this may make your urine smell like maple syrup Mother's Milk Tea Lactation cookies, google for the recipe Real oatmeal Body Armor sports drinks Greater Than hydration drink  

## 2020-08-23 DIAGNOSIS — Z20822 Contact with and (suspected) exposure to covid-19: Secondary | ICD-10-CM | POA: Diagnosis not present

## 2020-08-23 DIAGNOSIS — U071 COVID-19: Secondary | ICD-10-CM | POA: Diagnosis not present

## 2020-08-23 DIAGNOSIS — M791 Myalgia, unspecified site: Secondary | ICD-10-CM | POA: Diagnosis not present

## 2020-09-13 ENCOUNTER — Other Ambulatory Visit: Payer: Medicaid Other | Admitting: Obstetrics & Gynecology

## 2020-10-16 ENCOUNTER — Other Ambulatory Visit: Payer: Medicaid Other | Admitting: Obstetrics & Gynecology

## 2020-10-27 ENCOUNTER — Encounter: Payer: Self-pay | Admitting: Radiology

## 2020-11-07 ENCOUNTER — Other Ambulatory Visit: Payer: Medicaid Other | Admitting: Obstetrics & Gynecology

## 2020-11-20 ENCOUNTER — Encounter: Payer: Medicaid Other | Admitting: Physician Assistant

## 2020-11-21 ENCOUNTER — Ambulatory Visit
Admission: EM | Admit: 2020-11-21 | Discharge: 2020-11-21 | Discharge status: Absent without leave | Payer: Medicaid Other

## 2020-11-21 ENCOUNTER — Other Ambulatory Visit: Payer: Self-pay

## 2020-11-21 ENCOUNTER — Encounter: Payer: Self-pay | Admitting: Emergency Medicine

## 2020-11-21 ENCOUNTER — Ambulatory Visit
Admission: EM | Admit: 2020-11-21 | Discharge: 2020-11-21 | Disposition: A | Discharge status: Home / Self Care | Payer: Medicaid Other | Attending: Urgent Care | Admitting: Urgent Care

## 2020-11-21 DIAGNOSIS — Z20822 Contact with and (suspected) exposure to covid-19: Secondary | ICD-10-CM

## 2020-11-21 DIAGNOSIS — B349 Viral infection, unspecified: Secondary | ICD-10-CM | POA: Diagnosis not present

## 2020-11-21 DIAGNOSIS — R051 Acute cough: Secondary | ICD-10-CM

## 2020-11-21 DIAGNOSIS — R52 Pain, unspecified: Secondary | ICD-10-CM

## 2020-11-21 MED ORDER — CETIRIZINE HCL 10 MG PO TABS: 10.0000 mg | ORAL_TABLET | Freq: Every day | ORAL | 0 refills | Status: DC

## 2020-11-21 MED ORDER — PROMETHAZINE-DM 6.25-15 MG/5ML PO SYRP: 5.0000 mL | ORAL_SOLUTION | Freq: Every evening | ORAL | 0 refills | Status: DC | PRN

## 2020-11-21 MED ORDER — BENZONATATE 100 MG PO CAPS: 100.0000 mg | ORAL_CAPSULE | Freq: Three times a day (TID) | ORAL | 0 refills | Status: DC | PRN

## 2020-11-21 MED ORDER — PSEUDOEPHEDRINE HCL 30 MG PO TABS: 30.0000 mg | ORAL_TABLET | Freq: Three times a day (TID) | ORAL | 0 refills | Status: DC | PRN

## 2020-11-21 NOTE — ED Provider Notes (Signed)
[facility]   MRN: [id number] DOB: 1991-01-18  Subjective:   Misty Murillo is a 30 y.o. female presenting for 3-day history of acute onset body aches, headaches, throat pain, sinus congestion, started having a bad cough today.  No sick contacts to her knowledge, works from home.  No history of respiratory disorders like asthma.  She is not a smoker.  No chest pain, shortness of breath or wheezing.  No current facility-administered medications for this encounter.  Current Outpatient Medications:    Blood Pressure Monitoring DEVI, 1 each by Does not apply route once a week. (Patient not taking: Reported on 07/30/2020), Disp: 1 each, Rfl: 0   ferrous sulfate 325 (65 FE) MG tablet, Take 1 tablet (325 mg total) by mouth every other day. (Patient not taking: Reported on 07/30/2020), Disp: 45 tablet, Rfl: 2   hydrochlorothiazide (HYDRODIURIL) 25 MG tablet, Take 1 tablet (25 mg total) by mouth daily. To begin postpartum (Patient not taking: Reported on 07/30/2020), Disp: 30 tablet, Rfl: 0   Prenatal Vit-Fe Fumarate-FA (PRENATAL MULTIVITAMIN) TABS tablet, Take 1 tablet by mouth daily at 12 noon., Disp: , Rfl:    No Known Allergies  Past Medical History:  Diagnosis Date   Anxiety    Depression    denies   Fibroid    Graves disease    Headache    Liver mass    Migraines    PID (acute pelvic inflammatory disease) 07/2019   Pregnancy induced hypertension      Past Surgical History:  Procedure Laterality Date   CHOLECYSTECTOMY     RESECTION LIVER      Family History  Problem Relation Age of Onset   Diabetes Mother    Hyperthyroidism Mother    Migraines Father    Hypertension Father    Cancer Other    Hyperthyroidism Brother    Hypertension Brother    Cancer Maternal Grandmother    Cancer Maternal Grandfather     Social History   Tobacco Use   Smoking status: Former    Packs/day: 0.00    Types: Cigars, Cigarettes    Quit date: 01/27/2018    Years since  quitting: 2.8   Smokeless tobacco: Never  Vaping Use   Vaping Use: Never used  Substance Use Topics   Alcohol use: Not Currently    Comment: occasionally   Drug use: Not Currently    Types: Marijuana    Comment: last used mid October    ROS   Objective:   Vitals: BP 126/84 (BP Location: Right Arm)   Pulse 94   Temp 99.7 F (37.6 C) (Oral)   Resp 18   SpO2 98%   Breastfeeding No   Physical Exam Constitutional:      General: She is not in acute distress.    Appearance: Normal appearance. She is well-developed. She is obese. She is not ill-appearing, toxic-appearing or diaphoretic.  HENT:     Head: Normocephalic and atraumatic.     Right Ear: External ear normal.     Left Ear: External ear normal.     Nose: Nose normal.     Mouth/Throat:     Mouth: Mucous membranes are moist.     Pharynx: No oropharyngeal exudate or posterior oropharyngeal erythema.  Eyes:     General: No scleral icterus.       Right eye: No discharge.        Left eye: No discharge.     Extraocular Movements: Extraocular movements intact.  Conjunctiva/sclera: Conjunctivae normal.     Pupils: Pupils are equal, round, and reactive to light.  Cardiovascular:     Rate and Rhythm: Normal rate and regular rhythm.     Pulses: Normal pulses.     Heart sounds: Normal heart sounds. No murmur heard.   No friction rub. No gallop.  Pulmonary:     Effort: Pulmonary effort is normal. No respiratory distress.     Breath sounds: Normal breath sounds. No stridor. No wheezing, rhonchi or rales.  Skin:    General: Skin is warm and dry.     Findings: No rash.  Neurological:     Mental Status: She is alert and oriented to person, place, and time.  Psychiatric:        Mood and Affect: Mood normal.        Behavior: Behavior normal.        Thought Content: Thought content normal.        Judgment: Judgment normal.     Assessment and Plan :   PDMP not reviewed this encounter.  1. Acute viral syndrome   2.  Exposure to COVID-19 virus   3. Body aches   4. Acute cough    COVID and flu test pending.  We will otherwise manage for viral upper respiratory infection.  Physical exam findings reassuring and vital signs stable for discharge. Advised supportive care, offered symptomatic relief. Deferred imaging given clear cardiopulmonary exam, hemodynamically stable vital signs. Counseled patient on potential for adverse effects with medications prescribed/recommended today, ER and return-to-clinic precautions discussed, patient verbalized understanding.       Jaynee Eagles, [place] 11/21/20 1062

## 2020-11-21 NOTE — Discharge Instructions (Signed)

## 2020-11-21 NOTE — ED Triage Notes (Signed)
Headache, sore throat, bilateral ear pain, body aches, fever, chills, Since Monday.

## 2020-11-22 LAB — COVID-19, FLU A+B NAA
Influenza A, NAA: NOT DETECTED
Influenza B, NAA: NOT DETECTED
SARS-CoV-2, NAA: NOT DETECTED

## 2020-12-21 ENCOUNTER — Encounter: Payer: Medicaid Other | Admitting: Physician Assistant

## 2020-12-28 ENCOUNTER — Encounter: Payer: Self-pay | Admitting: Adult Health

## 2020-12-28 ENCOUNTER — Ambulatory Visit (INDEPENDENT_AMBULATORY_CARE_PROVIDER_SITE_OTHER): Payer: Medicaid Other | Admitting: Adult Health

## 2020-12-28 ENCOUNTER — Other Ambulatory Visit (HOSPITAL_COMMUNITY)
Admission: RE | Admit: 2020-12-28 | Discharge: 2020-12-28 | Disposition: A | Discharge status: Home / Self Care | Payer: Medicaid Other | Source: Ambulatory Visit | Attending: Adult Health | Admitting: Adult Health

## 2020-12-28 ENCOUNTER — Other Ambulatory Visit: Payer: Self-pay

## 2020-12-28 VITALS — BP 133/81 | HR 70 | Ht 64.0 in | Wt 243.0 lb

## 2020-12-28 DIAGNOSIS — Z113 Encounter for screening for infections with a predominantly sexual mode of transmission: Secondary | ICD-10-CM

## 2020-12-28 DIAGNOSIS — Z0001 Encounter for general adult medical examination with abnormal findings: Secondary | ICD-10-CM | POA: Insufficient documentation

## 2020-12-28 DIAGNOSIS — Z30011 Encounter for initial prescription of contraceptive pills: Secondary | ICD-10-CM | POA: Diagnosis not present

## 2020-12-28 DIAGNOSIS — Z124 Encounter for screening for malignant neoplasm of cervix: Secondary | ICD-10-CM | POA: Diagnosis not present

## 2020-12-28 DIAGNOSIS — N926 Irregular menstruation, unspecified: Secondary | ICD-10-CM | POA: Diagnosis not present

## 2020-12-28 DIAGNOSIS — Z3009 Encounter for other general counseling and advice on contraception: Secondary | ICD-10-CM | POA: Insufficient documentation

## 2020-12-28 LAB — POCT URINE PREGNANCY: Preg Test, Ur: NEGATIVE

## 2020-12-28 MED ORDER — LO LOESTRIN FE 1 MG-10 MCG / 10 MCG PO TABS: 1.0000 | ORAL_TABLET | Freq: Every day | ORAL | 11 refills | Status: DC

## 2020-12-28 NOTE — Progress Notes (Signed)
Patient ID: Misty Murillo, female   DOB: Dec 14, 1990, 30 y.o.   MRN: [id number] History of Present Illness: Kassie is a 30 year old black female,single, [id number], in complaining of irregular and frequent periods. She delivered twin boys in May 2022. And has 52 yo son too.  She needs pap.    Current Medications, Allergies, Past Medical History, Past Surgical History, Family History and Social History were reviewed in [employer] record.     Review of Systems: Patient denies any headaches, hearing loss, fatigue, blurred vision, shortness of breath, chest pain, abdominal pain, problems with bowel movements, urination, or intercourse. No joint pain or mood swings.  +irregular frequent periods and cramps    Physical Exam:BP 133/81 (BP Location: Left Arm, Patient Position: Sitting, Cuff Size: Normal)   Pulse 70   Ht 5\' 4"  (1.626 m)   Wt 243 lb (110.2 kg)   LMP 12/11/2020   Breastfeeding No   BMI 41.71 kg/m  UPT is negative  General:  Well developed, well nourished, no acute distress Skin:  Warm and dry Neck:  Midline trachea, normal thyroid, good ROM, no lymphadenopathy Lungs; Clear to auscultation bilaterally Cardiovascular: Regular rate and rhythm Pelvic:  External genitalia is normal in appearance, no lesions.  The vagina is normal in appearance. Urethra has no lesions or masses. The cervix is bulbous. Pap with GC/CHL and HR HPV genotyping performed. Uterus is felt to be normal size, shape, and contour.  No adnexal masses or tenderness noted.Bladder is non tender, no masses felt. Extremities/musculoskeletal:  No swelling or varicosities noted, no clubbing or cyanosis Psych:  No mood changes, alert and cooperative,seems happy Fall risk is low  Upstream - 12/28/20 1018       Pregnancy Intention Screening   Does the patient want to become pregnant in the next year? No    Does the patient's partner want to become pregnant in the next year? No    Would the patient  like to discuss contraceptive options today? Yes      Contraception Wrap Up   Current Method Female Condom    End Method Oral Contraceptive    Contraception Counseling Provided Yes              Examination chaperoned by Celene Squibb LPN  Impression and Plan: 1. Irregular periods Will try low dose COC, given 1 pack lo Loestrin to start today and use condoms  2. Routine cervical smear Pap sent Pap in 3 years of normal   3. Screen for STD (sexually transmitted disease) Pap sent with GC/CHL  4. Encounter for initial prescription of contraceptive pills Discussed options and will try the pill Meds ordered this encounter  Medications   Norethindrone-Ethinyl Estradiol-Fe Biphas (LO LOESTRIN FE) 1 MG-10 MCG / 10 MCG tablet    Sig: Take 1 tablet by mouth daily. Take 1 daily by mouth    Dispense:  28 tablet    Refill:  11    BIN [id number], PCN [id number], GRP [id number]    Order Specific Question:   Supervising Provider    Answer:   Tania Ade H [[id number]]   Follow up in 3 months for ROS

## 2021-01-01 LAB — CYTOLOGY - PAP
Chlamydia: NEGATIVE
Comment: NEGATIVE
Comment: NEGATIVE
Comment: NORMAL
Diagnosis: NEGATIVE
High risk HPV: NEGATIVE
Neisseria Gonorrhea: NEGATIVE

## 2021-01-17 ENCOUNTER — Ambulatory Visit
Admission: EM | Admit: 2021-01-17 | Discharge: 2021-01-17 | Disposition: A | Discharge status: Home / Self Care | Payer: Medicaid Other | Attending: Emergency Medicine | Admitting: Emergency Medicine

## 2021-01-17 ENCOUNTER — Other Ambulatory Visit: Payer: Self-pay

## 2021-01-17 DIAGNOSIS — L500 Allergic urticaria: Secondary | ICD-10-CM

## 2021-01-17 MED ORDER — FAMOTIDINE 20 MG PO TABS: 20.0000 mg | ORAL_TABLET | Freq: Two times a day (BID) | ORAL | 0 refills | Status: DC

## 2021-01-17 MED ORDER — EPINEPHRINE 0.3 MG/0.3ML IJ SOAJ: 0.3000 mg | Freq: Once | INTRAMUSCULAR | 0 refills | Status: AC

## 2021-01-17 MED ORDER — HYDROXYZINE HCL 25 MG PO TABS: 25.0000 mg | ORAL_TABLET | Freq: Four times a day (QID) | ORAL | 0 refills | Status: DC | PRN

## 2021-01-17 MED ORDER — PREDNISONE 10 MG (21) PO TBPK: ORAL_TABLET | ORAL | 0 refills | Status: DC

## 2021-01-17 MED ORDER — PREDNISONE 50 MG PO TABS
60.0000 mg | ORAL_TABLET | Freq: Once | ORAL | Status: AC
  Administered 2021-01-17: 12:00:00 60 mg via ORAL

## 2021-01-17 NOTE — ED Provider Notes (Signed)
HPI  SUBJECTIVE:  Misty Murillo is a 30 y.o. female who presents with 1 week of worsening pruritic urticaria "everywhere", left upper lip swelling, right upper eyelid swelling.  She states that the urticaria started the same day when she started new birth control pills.  She discontinued the OCPs 3 to 4 days ago.  No tongue swelling, difficulty breathing, sensation of throat swelling shut, wheezing, nausea, vomiting, diarrhea, abdominal pain.  She has had symptoms like this before and has been evaluated by an allergist for this.  She has tried 50 mg of Benadryl twice and oatmeal wash without improvement in her symptoms.  No aggravating factors.  LMP: 2 days ago.  Denies the possibility of being pregnant.  She is not breast-feeding.  PMH : urticaria, no anaphylaxis.  PMD: None.    Past Medical History:  Diagnosis Date   Anxiety    Depression    denies   Fibroid    Graves disease    Headache    Liver mass    Migraines    PID (acute pelvic inflammatory disease) 07/2019   Pregnancy induced hypertension     Past Surgical History:  Procedure Laterality Date   CHOLECYSTECTOMY     RESECTION LIVER      Family History  Problem Relation Age of Onset   Cancer Maternal Grandmother    Cancer Maternal Grandfather    Migraines Father    Hypertension Father    Diabetes Mother    Hyperthyroidism Mother    Hyperthyroidism Brother    Hypertension Brother    Cancer Other     Social History   Tobacco Use   Smoking status: Former    Packs/day: 0.00    Types: Cigars, Cigarettes    Quit date: 01/27/2018    Years since quitting: 2.9   Smokeless tobacco: Never  Vaping Use   Vaping Use: Never used  Substance Use Topics   Alcohol use: Not Currently    Comment: occasionally   Drug use: Not Currently    Types: Marijuana    Comment: last used mid October    No current facility-administered medications for this encounter.  Current Outpatient Medications:    famotidine (PEPCID) 20 MG  tablet, Take 1 tablet (20 mg total) by mouth 2 (two) times daily., Disp: 10 tablet, Rfl: 0   hydrOXYzine (ATARAX) 25 MG tablet, Take 1 tablet (25 mg total) by mouth every 6 (six) hours as needed for itching., Disp: 20 tablet, Rfl: 0   predniSONE (STERAPRED UNI-PAK 21 TAB) 10 MG (21) TBPK tablet, Dispense one 6 day pack. Take as directed with food., Disp: 21 tablet, Rfl: 0  No Known Allergies   ROS  As noted in HPI.   Physical Exam  BP 138/83 (BP Location: Right Arm)    Pulse 71    Temp 98.2 F (36.8 C) (Oral)    Resp 17    SpO2 99%    Breastfeeding No   Constitutional: Well developed, well nourished, no acute distress Eyes:  EOMI, conjunctiva normal bilaterally.  Positive right upper eyelid swelling.   HENT: Normocephalic, atraumatic,mucus membranes moist.  Left upper lip swelling, tongue normal.  Airway widely patent.  Normal voice.  No stridor.   Respiratory: Normal inspiratory effort, lungs clear bilaterally Cardiovascular: Normal rate, regular rhythm GI: nondistended skin: Diffuse urticaria over torso, arms, hands, face         Musculoskeletal: no deformities Neurologic: Alert & oriented x 3, no focal neuro deficits Psychiatric: Speech and  behavior appropriate   ED Course   Medications  predniSONE (DELTASONE) tablet 60 mg (60 mg Oral Given 01/17/21 1222)    No orders of the defined types were placed in this encounter.   No results found for this or any previous visit (from the past 24 hour(s)). No results found.  ED Clinical Impression  1. Allergic urticaria      ED Assessment/Plan  Patient with urticaria, most likely from recent OCPs.  There is no evidence of anaphylaxis at this time.  Patient appears uncomfortable, scratching herself, so starting her on prednisone 60 mg here.  Will send home with a 6-day prednisone taper, Pepcid, EpiPen.  Advise dClaritin or Zyrtec, if this does not work, then Atarax.  Will have patient follow-up with [facility]  family medicine, I will also order assistance in finding a PMD.  ER return precautions given.   Discussed  MDM, treatment plan, and plan for follow-up with patient. Discussed sn/sx that should prompt return to the ED. patient agrees with plan.   Meds ordered this encounter  Medications   predniSONE (DELTASONE) tablet 60 mg   EPINEPHrine 0.3 mg/0.3 mL IJ SOAJ injection    Sig: Inject 0.3 mg into the muscle once for 1 dose.    Dispense:  0.3 mL    Refill:  0   famotidine (PEPCID) 20 MG tablet    Sig: Take 1 tablet (20 mg total) by mouth 2 (two) times daily.    Dispense:  10 tablet    Refill:  0   predniSONE (STERAPRED UNI-PAK 21 TAB) 10 MG (21) TBPK tablet    Sig: Dispense one 6 day pack. Take as directed with food.    Dispense:  21 tablet    Refill:  0   hydrOXYzine (ATARAX) 25 MG tablet    Sig: Take 1 tablet (25 mg total) by mouth every 6 (six) hours as needed for itching.    Dispense:  20 tablet    Refill:  0      *This clinic note was created using [employer]. Therefore, there may be occasional mistakes despite careful proofreading.  ?    Melynda Ripple, MD 01/18/21 1248

## 2021-01-17 NOTE — ED Triage Notes (Signed)
Pt reports right eye swollen, hives al over the body and itching throat x 1 week. States symptoms started after a new birth control; pt stopped. Pt denies difficulty talking, breathing swallowing.

## 2021-01-17 NOTE — Discharge Instructions (Addendum)
You can start the steroids tomorrow.  Try the Pepcid, Claritin or Zyrtec.  If the Claritin or Zyrtec do not help, then try Atarax.  EpiPen's in case if it turns in anaphylaxis.  Use it and go immediately to the ED for any signs of anaphylaxis.

## 2021-01-25 ENCOUNTER — Other Ambulatory Visit: Payer: Medicaid Other | Admitting: Adult Health

## 2021-02-01 ENCOUNTER — Telehealth (INDEPENDENT_AMBULATORY_CARE_PROVIDER_SITE_OTHER): Payer: Medicaid Other | Admitting: Family Medicine

## 2021-02-01 ENCOUNTER — Encounter: Payer: Self-pay | Admitting: Family Medicine

## 2021-02-01 ENCOUNTER — Other Ambulatory Visit: Payer: Self-pay

## 2021-02-01 DIAGNOSIS — T783XXD Angioneurotic edema, subsequent encounter: Secondary | ICD-10-CM

## 2021-02-01 DIAGNOSIS — L509 Urticaria, unspecified: Secondary | ICD-10-CM

## 2021-02-01 MED ORDER — PREDNISONE 20 MG PO TABS: 40.0000 mg | ORAL_TABLET | Freq: Every day | ORAL | 0 refills | Status: AC

## 2021-02-01 NOTE — Patient Instructions (Signed)
Meds have been sent the the pharmacy You can start zyrtec(cetirizine) or xyzal over the counter, daily to help prevent the hives If worsening symptoms, let us know or go to the Emergency room   Doing referral to allergist

## 2021-02-01 NOTE — Progress Notes (Signed)
MyChart Video Visit    Virtual Visit via Video Note   This visit type was conducted due to national recommendations for restrictions regarding the COVID-19 Pandemic (e.g. social distancing) in an effort to limit this patient's exposure and mitigate transmission in our community. This patient is at least at moderate risk for complications without adequate follow up. This format is felt to be most appropriate for this patient at this time. Physical exam was limited by quality of the video and audio technology used for the visit. CMA was able to get the patient set up on a video visit.  Patient location: Home. Patient and provider in visit Provider location: Office  I discussed the limitations of evaluation and management by telemedicine and the availability of in person appointments. The patient expressed understanding and agreed to proceed.  Visit Date: 02/01/2021  Today's healthcare provider: Wellington Hampshire, MD     Subjective:    Patient ID: Misty Murillo, female    DOB: 1990/05/17, 31 y.o.   MRN: [id number]  Chief Complaint  Patient presents with   Urticaria    Getting worse, eyes, lips swollen Has been going on over 2 weeks Possible allergic reaction to birth control     HPI Hives for LONG time.  Started <37yr after birth of son 11 yrs ago.  Lasted sev yrs.  Itchy. Saw mult docs back then. Then  Nothing for 2-3 yrs.  Just had twin sons 8 mo ago.  Started new ocp about 1 mo ago so possibly allergic rxn to that.  Started up again w/hives, lip and eyelid swelling.  Went to ER 12/29(reviewed notes).  Got shot of pred and on dose back and pepcid and hydroxyzine.  Some better, but still getting hives-can be anywhere including palms.  Face seems to be the most freq area right now.  Wakes up w/them, takes hydroxyzine and helps, but then comes back about 6-8 hrs later.  No airway compromise, but can get scratchy throat or cough.  No new foods, soaps, toothpaste,piercings, etc.  Just the OCP  was only new thing. Has done allergy testing in past and "allergic to everything".   Heat or cold can set off hives  Past Medical History:  Diagnosis Date   Anxiety    Depression    denies   Fibroid    Graves disease    Headache    Liver mass    Migraines    PID (acute pelvic inflammatory disease) 07/2019   Pregnancy induced hypertension     Past Surgical History:  Procedure Laterality Date   CHOLECYSTECTOMY     RESECTION LIVER     hemangioma    Outpatient Medications Prior to Visit  Medication Sig Dispense Refill   EPIPEN 2-PAK 0.3 MG/0.3ML SOAJ injection Inject into the muscle.     famotidine (PEPCID) 20 MG tablet Take 1 tablet (20 mg total) by mouth 2 (two) times daily. 10 tablet 0   hydrOXYzine (ATARAX) 25 MG tablet Take 1 tablet (25 mg total) by mouth every 6 (six) hours as needed for itching. 20 tablet 0   predniSONE (STERAPRED UNI-PAK 21 TAB) 10 MG (21) TBPK tablet Dispense one 6 day pack. Take as directed with food. 21 tablet 0   No facility-administered medications prior to visit.    No Known Allergies-OCP      Objective:     Physical Exam Vitals and nursing note reviewed.  Constitutional:      General:  is not  in acute distress.    Appearance: Normal appearance.  HENT:     Head: Normocephalic. -upper lip swollen some Pulmonary:     Effort: No respiratory distress.  Musculoskeletal:     Cervical back: Normal range of motion.  Skin:    General: Skin is dry.   Some hives on face/neck    Coloration: Skin is not pale.  Some acanthosis nigrans neck.  Some hyper and hypopigmented areas on face/neck Neurological:     Mental Status: Pt is alert and oriented to person, place, and time.  Psychiatric:        Mood and Affect: Mood normal.   There were no vitals taken for this visit.  Wt Readings from Last 3 Encounters:  12/28/20 243 lb (110.2 kg)  06/06/20 241 lb 11.2 oz (109.6 kg)  06/05/20 241 lb (109.3 kg)       Assessment & Plan:   Problem List  Items Addressed This Visit   None Visit Diagnoses     Hives    -  Primary   Relevant Medications   predniSONE (DELTASONE) 20 MG tablet   Angioedema of lips, subsequent encounter       Relevant Medications   predniSONE (DELTASONE) 20 MG tablet      ? Still from rxn to [facility]   or possibly to something else.  Has had in past.  Will do pred 7 ays more.  Take zyrtec daily.  Refer to allergist Has appt to establish few wks  Meds ordered this encounter  Medications   predniSONE (DELTASONE) 20 MG tablet    Sig: Take 2 tablets (40 mg total) by mouth daily with breakfast for 7 days.    Dispense:  14 tablet    Refill:  0    I discussed the assessment and treatment plan with the patient. The patient was provided an opportunity to ask questions and all were answered. The patient agreed with the plan and demonstrated an understanding of the instructions.   The patient was advised to call back or seek an in-person evaluation if the symptoms worsen or if the condition fails to improve as anticipated.  I provided 30 minutes of face-to-face time during this encounter.   Wellington Hampshire, MD [place] [phone] (phone) [phone] (fax)  [place]

## 2021-02-22 ENCOUNTER — Encounter: Payer: Medicaid Other | Admitting: Physician Assistant

## 2021-03-29 ENCOUNTER — Ambulatory Visit: Payer: Medicaid Other | Admitting: Adult Health

## 2021-04-15 DIAGNOSIS — S59909A Unspecified injury of unspecified elbow, initial encounter: Secondary | ICD-10-CM | POA: Diagnosis not present

## 2021-04-15 DIAGNOSIS — R5381 Other malaise: Secondary | ICD-10-CM | POA: Diagnosis not present

## 2021-04-20 IMAGING — US US MFM OB DETAIL EACH ADDL GEST+14 WK
1 series · 14 of 28 positions shown · non-contrast
Comparison: none

[Series 1: us mfm ob detail each addl gest+14 wk · 14 of 191 slices shown]
[im 8/191]
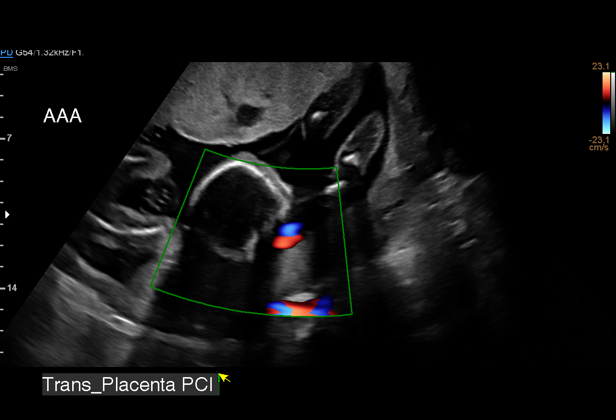
[im 22/191]
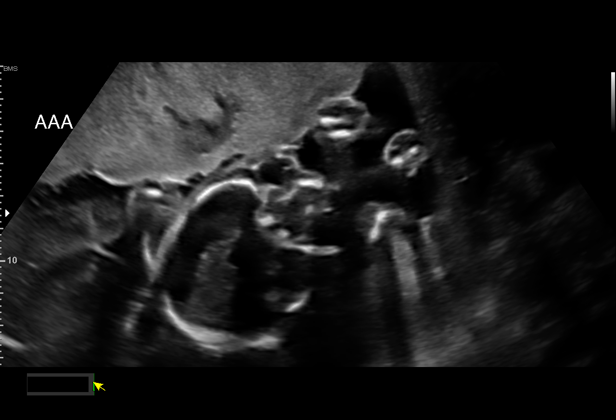
[im 36/191]
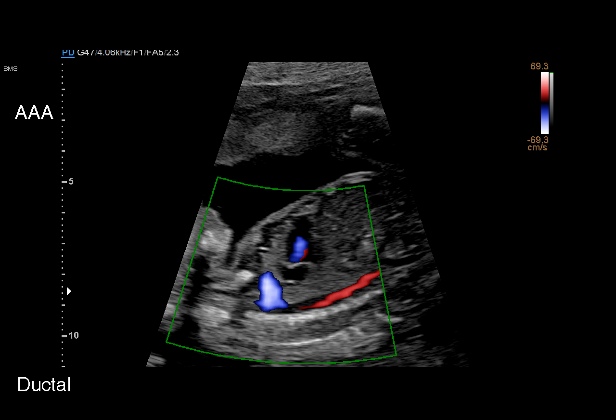
[im 50/191]
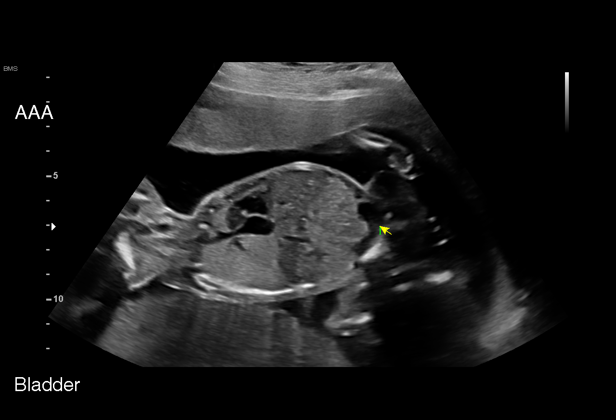
[im 64/191]
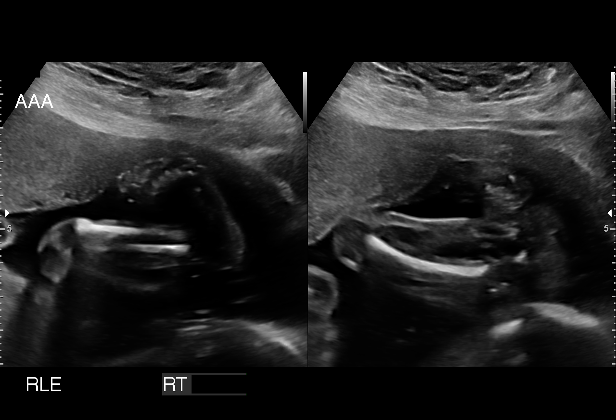
[im 78/191]
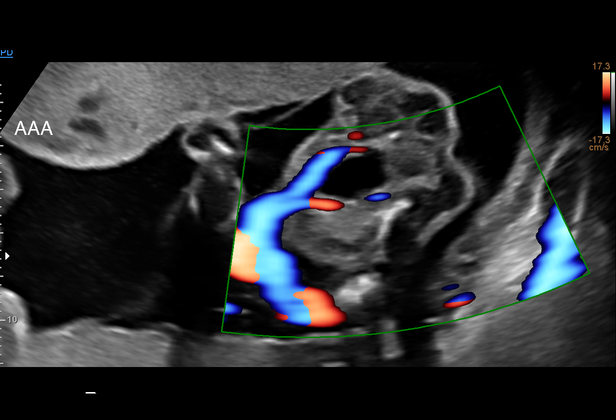
[im 92/191]
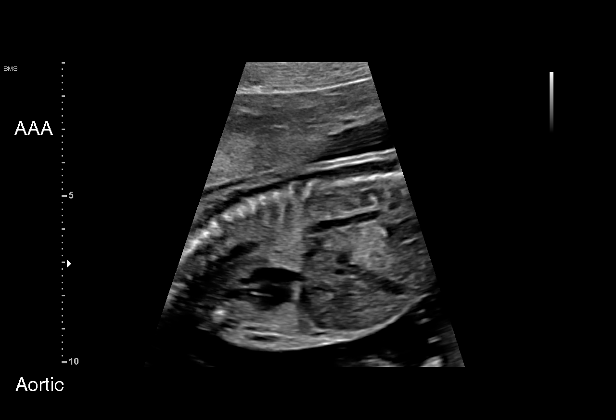
[im 106/191]
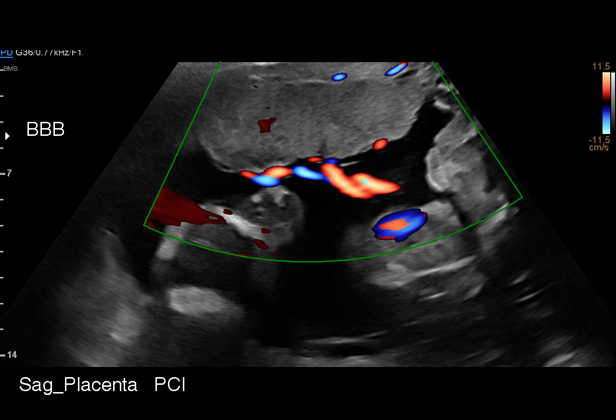
[im 120/191]
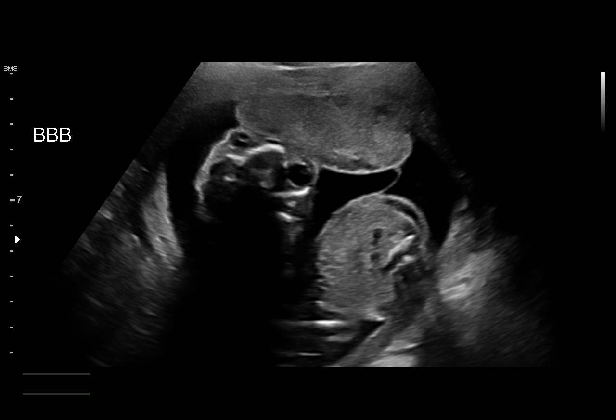
[im 134/191]
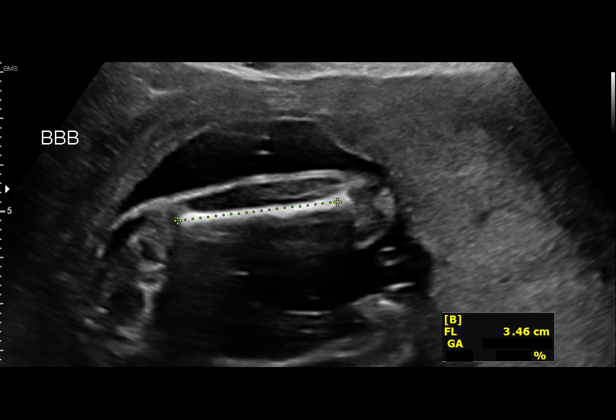
[im 148/191]
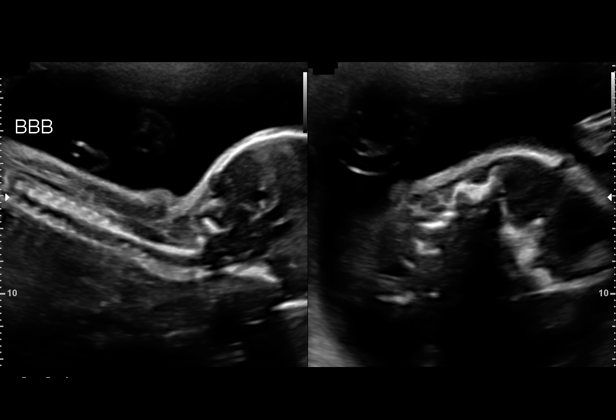
[im 162/191]
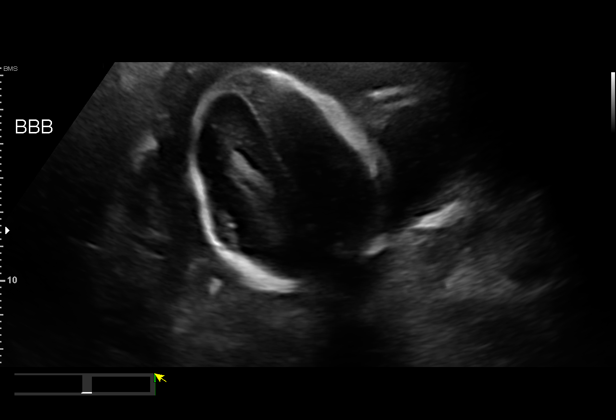
[im 176/191]
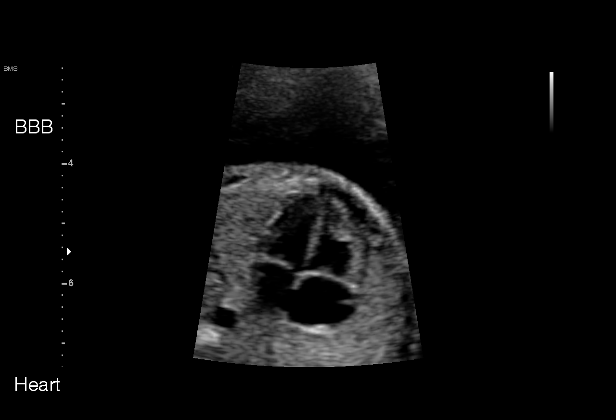
[im 191/191]
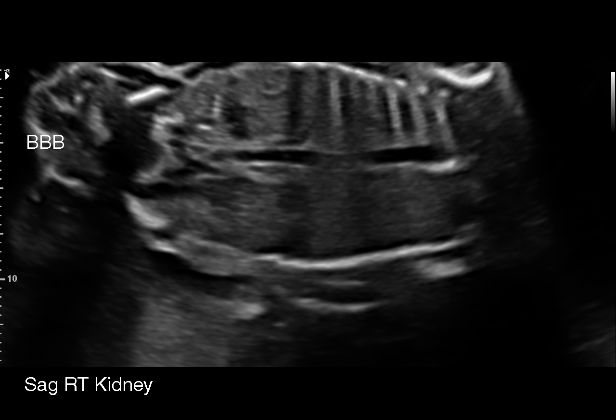

[14 of 28 positions shown; findings below may reference images not displayed]

1  US MFM OB DETAIL ADDL GEST            76811.02    ZUBAIDI JASPER
    +14 WK

Indications

 21 weeks gestation of pregnancy
 Encounter for antenatal screening for
 malformations ([employer] [employer])
 Genetic carrier (Sickle Cell disease)
 Obesity complicating pregnancy, second
 trimester (BMI 42)
 Hyperthyroid (Graves disease)
 Medical complication of pregnancy (Covid-
 19, 02-10-20, Acute Pelvic Inflammatory
 Disease, Liver mass)
 Uterine fibroids affecting pregnancy in        O34.12,
 second trimester, antepartum

Fetal Evaluation (Fetus A)

 Num Of Fetuses:         2
 Fetal Heart Rate(bpm):  164
 Cardiac Activity:       Observed
 Fetal Lie:              Maternal left side Lower
 Presentation:           Transverse, head to maternal right
 Placenta:               Posterior
 P. Cord Insertion:      Visualized, central
 Amniotic Fluid
 AFI FV:      Within normal limits

                             Largest Pocket(cm)


 Comment:    Membrane vizualized, but appears thin
Biometry (Fetus A)

 BPD:      49.9  mm     G. Age:  21w 1d         24  %    CI:        71.25   %    70 - 86
                                                         FL/HC:      19.3   %    18.4 -
 HC:      188.3  mm     G. Age:  21w 1d         17  %    HC/AC:      1.09        1.06 -
 AC:      172.2  mm     G. Age:  22w 1d         58  %    FL/BPD:     72.9   %    71 - 87
 FL:       36.4  mm     G. Age:  21w 4d         35  %    FL/AC:      21.1   %    20 - 24
 HUM:      34.5  mm     G. Age:  21w 6d         51  %
 CER:      24.4  mm     G. Age:  22w 3d         86  %
 LV:        5.1  mm
 CM:        7.6  mm

 Est. FW:     451  gm           1 lb     48  %     FW Discordancy      0 \ 2 %
OB History

 Gravidity:    3
 Living:       1
Gestational Age (Fetus A)

 LMP:           21w 5d        Date:  09/25/19                 EDD:   07/01/20
 U/S Today:     21w 4d                                        EDD:   07/02/20
 Best:          21w 5d     Det. By:  LMP  (09/25/19)          EDD:   07/01/20
Anatomy (Fetus A)

 Cranium:               Appears normal         LVOT:                   Appears normal
 Cavum:                 Appears normal         Aortic Arch:            Appears normal
 Ventricles:            Appears normal         Ductal Arch:            Appears normal
 Choroid Plexus:        Appears normal         Diaphragm:              Appears normal
 Cerebellum:            Appears normal         Stomach:                Appears normal, left
                                                                       sided
 Posterior Fossa:       Appears normal         Abdomen:                Appears normal
 Nuchal Fold:           Not applicable (>20    Abdominal Wall:         Appears nml (cord
                        wks GA)                                        insert, abd wall)
 Face:                  Appears normal         Cord Vessels:           Appears normal (3
                        (orbits and profile)                           vessel cord)
 Lips:                  Appears normal         Kidneys:                Appear normal
 Palate:                Not well visualized    Bladder:                Appears normal
 Thoracic:              Appears normal         Spine:                  Appears normal
 Heart:                 Appears normal         Upper Extremities:      Appears normal
                        (4CH, axis, and
                        situs)
 RVOT:                  Appears normal         Lower Extremities:      Appears normal

 Other:  Lt. 5th visualized. Lenses visualized. Fetus appears to be a male.
Fetal Evaluation (Fetus B)
 Num Of Fetuses:         2
 Fetal Heart Rate(bpm):  147
 Cardiac Activity:       Observed
 Fetal Lie:              Upper Fetus Mat Right
 Presentation:           Cephalic
 Placenta:               Anterior
 P. Cord Insertion:      Visualized, central

 Amniotic Fluid
 AFI FV:      Within normal limits

                             Largest Pocket(cm)

Biometry (Fetus B)

 BPD:      52.7  mm     G. Age:  22w 0d         59  %    CI:        70.26   %    70 - 86
                                                         FL/HC:      18.1   %    18.4 -
 HC:      200.5  mm     G. Age:  22w 1d         60  %    HC/AC:      1.21        1.06 -
 AC:      166.1  mm     G. Age:  21w 4d         40  %    FL/BPD:     68.9   %    71 - 87
 FL:       36.3  mm     G. Age:  21w 4d         33  %    FL/AC:      21.9   %    20 - 24
 HUM:      33.3  mm     G. Age:  21w 2d         37  %
 CER:      25.7  mm     G. Age:  23w 2d         98  %

 LV:        4.5  mm
 CM:        5.1  mm

 Est. FW:     441  gm           1 lb     41  %     FW Discordancy         2  %
Gestational Age (Fetus B)

 LMP:           21w 5d        Date:  09/25/19                 EDD:   07/01/20
 U/S Today:     21w 6d                                        EDD:   06/30/20
 Best:          21w 5d     Det. By:  LMP  (09/25/19)          EDD:   07/01/20
Anatomy (Fetus B)

 Cranium:               Appears normal         LVOT:                   Appears normal
 Cavum:                 Appears normal         Aortic Arch:            Appears normal
 Ventricles:            Appears normal         Ductal Arch:            Not well visualized
 Choroid Plexus:        Appears normal         Diaphragm:              Appears normal
 Cerebellum:            Appears normal         Stomach:                Appears normal, left
                                                                       sided
 Posterior Fossa:       Appears normal         Abdomen:                Appears normal
 Nuchal Fold:           Not applicable (>20    Abdominal Wall:         Appears nml (cord
                        wks GA)                                        insert, abd wall)
 Face:                  Appears normal         Cord Vessels:           Appears normal (3
                        (orbits and profile)                           vessel cord)
 Lips:                  Not well visualized    Kidneys:                Appear normal
 Palate:                Not well visualized    Bladder:                Appears normal
 Thoracic:              Appears normal         Spine:                  Appears normal
 Heart:                 Appears normal         Upper Extremities:      LUE Vis, RUE NWV
                        (4CH, axis, and
                        situs)
 RVOT:                  Not well visualized    Lower Extremities:      Appears normal

 Other:  Fetus appears to be a male. Lenses visualized. Hands not well
         visualized.
Cervix Uterus Adnexa

 Cervix
 Length:           4.24  cm.
 Normal appearance by transabdominal scan.

 Uterus
 Single fibroid noted, see table below.

 Right Ovary
 Within normal limits.

 Left Ovary
 Not visualized.

 Cul De Sac
 No free fluid seen.

 Adnexa
 No adnexal mass visualized.

 Comment
 Fibroid measuring 5.3 x 5.1 x 5.71cm
Impression

 Ms. Sibehat is a 29 yo G3P1 who is here with suspected
 Diamniotic Dichorionic twin pregnancy (Based on early
 ultrasound imaged performed at her providers office)

 She is dated by aLMP of 07/01/2020, today's examination
 and measurements were consistent with this EDD.

 Normal twin pregnancy was observed with normal anatomy.
 Suboptimal views of the fetal parts were observed in both
 twins however, there were no markers of aneuploidy
 appreciated.

 Her medical history is consistent with Graves disease in
 which she is under the care of Dr. Gutium an endocrinologist.
 She was taking methimazole prior to pregnancy which was
 discontinued again prior to pregnancy.
 Her recent TSH was 0.35. She had a recent visit with Dr.
 Gutium and she reports that no further therapy is required at
 this time.

 We discussed the increased risk of preeclampsia, thyroid
 storm and neonatal graves disease. Therefore we
 recommend TRAB antibodies drawn to assess fetal risk,
 serial growth and to intiate weekly testing at 32 weeks. In
 addition,
 reassess TSH every trimester.

 She also has sickle cell trait but her significant other has not
 been screened we discussed that there is a [DATE] chance of her
 child have sickle cell disease if her significant other has sickle
 cell trait so we recommend that he be screened.

 She recently had COVID pneumonia [DATE] is feeling better
 and overall doing well.

 Lastly, we discussed the  Twin intrauterine pregnancy and
 that there is < 1% growth discordance.
 Again I reviewed the normal nature of today's ultrasound. Ms.
 Sibehat conveyed that she is taking low dose aspirin for
 preeclampsia prevention.

 We reviewed the sonographic findings and limitations of
 ultrasound. The potential risks associated with a twin
 gestation were discussed.  This discussion included a review
 of the increased risk of miscarriages, anomalies, preterm
 labor, and/or delivery, malpresentation, delivery via cesarean
 section, gestational diabetes, and/or preeclampsia.  With
 regards to fetal risks, there is an increased risk for fetal
 growth restriction of one or both twins, preterm labor, and
 associated morbidity, and intrauterine fetal demise.

 We recommend growth scans every 4 weeks starting at 24
 weeks with the initation of weekly antenatal testing in the
 form of twice weekly NST or weekly BPP should abnormal
 fetal growth or intertwin discordance of greater than 20-25%
 is noted.
 Following counseling, all questions were addressed.
Recommendations

 Follow up growth in 4 weeks.

## 2021-05-24 NOTE — Telephone Encounter (Signed)
Error

## 2021-07-31 ENCOUNTER — Encounter (HOSPITAL_COMMUNITY): Payer: Self-pay

## 2021-07-31 ENCOUNTER — Emergency Department (HOSPITAL_COMMUNITY)
Admission: EM | Admit: 2021-07-31 | Discharge: 2021-07-31 | Disposition: A | Discharge status: Home / Self Care | Payer: BC Managed Care – PPO | Attending: Emergency Medicine | Admitting: Emergency Medicine

## 2021-07-31 ENCOUNTER — Other Ambulatory Visit: Payer: Self-pay

## 2021-07-31 DIAGNOSIS — J029 Acute pharyngitis, unspecified: Secondary | ICD-10-CM | POA: Diagnosis not present

## 2021-07-31 DIAGNOSIS — Z20822 Contact with and (suspected) exposure to covid-19: Secondary | ICD-10-CM | POA: Insufficient documentation

## 2021-07-31 DIAGNOSIS — H9202 Otalgia, left ear: Secondary | ICD-10-CM | POA: Diagnosis present

## 2021-07-31 DIAGNOSIS — H6592 Unspecified nonsuppurative otitis media, left ear: Secondary | ICD-10-CM

## 2021-07-31 LAB — GROUP A STREP BY PCR: Group A Strep by PCR: NOT DETECTED

## 2021-07-31 LAB — SARS CORONAVIRUS 2 BY RT PCR: SARS Coronavirus 2 by RT PCR: NEGATIVE

## 2021-07-31 MED ORDER — DEXAMETHASONE 4 MG PO TABS
16.0000 mg | ORAL_TABLET | Freq: Once | ORAL | Status: AC
Start: 2021-07-31 — End: 2021-07-31
  Administered 2021-07-31: 16 mg via ORAL
  Filled 2021-07-31: qty 4

## 2021-07-31 MED ORDER — LIDOCAINE VISCOUS HCL 2 % MT SOLN: 15.0000 mL | OROMUCOSAL | 1 refills | Status: DC | PRN

## 2021-07-31 MED ORDER — IBUPROFEN 800 MG PO TABS
800.0000 mg | ORAL_TABLET | Freq: Once | ORAL | Status: AC
  Administered 2021-07-31: 800 mg via ORAL
  Filled 2021-07-31: qty 1

## 2021-07-31 MED ORDER — AMOXICILLIN 500 MG PO CAPS: 500.0000 mg | ORAL_CAPSULE | Freq: Three times a day (TID) | ORAL | 0 refills | Status: DC

## 2021-07-31 MED ORDER — LIDOCAINE VISCOUS HCL 2 % MT SOLN
15.0000 mL | Freq: Once | OROMUCOSAL | Status: AC
  Administered 2021-07-31: 15 mL via OROMUCOSAL
  Filled 2021-07-31: qty 15

## 2021-07-31 NOTE — Discharge Instructions (Signed)
You have fluid in your ear. Right now it does not look like a bacterial infection, it could be viral or allergies. I have given you a prescription for antibiotics, wait a few days and see if your symptoms improve with over the counter decongestants. If not getting better or your develop a fever then please fill the antibiotics.

## 2021-07-31 NOTE — ED Triage Notes (Signed)
Pt presents to ED with c/o of earache to left ear and sore throat (left side only) that started two days ago.

## 2021-08-01 NOTE — ED Provider Notes (Signed)
[facility] EMERGENCY DEPARTMENT Provider Note   CSN: [id number] Arrival date & time: 07/31/21  1927     History  Chief Complaint  Patient presents with   Otalgia    Left ear    Misty Murillo is a 31 y.o. female.  31 year old female who presents the ER today with approximately 3 days of most Allers right knee became left sore throat and then also left ear pain.  No fevers.  No postnasal drip, sinus pain, cough or other associated symptoms.  States it hurts to swallow but is having trouble swallowing or breathing.  No significant swelling noted.  No known sick contacts.   Otalgia      Home Medications Prior to Admission medications   Medication Sig Start Date End Date Taking? Authorizing Provider  amoxicillin (AMOXIL) 500 MG capsule Take 1 capsule (500 mg total) by mouth 3 (three) times daily. 07/31/21  Yes Makennah Omura, Corene Cornea, MD  lidocaine (XYLOCAINE) 2 % solution Use as directed 15 mLs in the mouth or throat as needed for mouth pain. 07/31/21  Yes Wayne Brunker, Corene Cornea, MD  EPIPEN 2-PAK 0.3 MG/0.3ML SOAJ injection Inject into the muscle. 01/17/21   [provider]  famotidine (PEPCID) 20 MG tablet Take 1 tablet (20 mg total) by mouth 2 (two) times daily. 01/17/21   Melynda Ripple, MD  hydrOXYzine (ATARAX) 25 MG tablet Take 1 tablet (25 mg total) by mouth every 6 (six) hours as needed for itching. 01/17/21   Melynda Ripple, MD      Allergies    Patient has no known allergies.    Review of Systems   Review of Systems  HENT:  Positive for ear pain.     Physical Exam Updated Vital Signs BP 123/68 (BP Location: Left Arm)   Pulse 85   Temp 98.4 F (36.9 C) (Oral)   Resp 16   Ht '5\' 4"'$  (1.626 m)   Wt 90.7 kg   SpO2 100%   BMI 34.33 kg/m  Physical Exam Vitals and nursing note reviewed.  Constitutional:      Appearance: She is well-developed.  HENT:     Head: Normocephalic and atraumatic.     Left Ear: A middle ear effusion is present. No hemotympanum. Tympanic  membrane is not perforated or erythematous.     Mouth/Throat:     Pharynx: Posterior oropharyngeal erythema (left pharyngeal area) present.  Cardiovascular:     Rate and Rhythm: Normal rate and regular rhythm.  Pulmonary:     Effort: No respiratory distress.     Breath sounds: No stridor.  Abdominal:     General: There is no distension.  Musculoskeletal:        General: No swelling or tenderness. Normal range of motion.     Cervical back: Normal range of motion.  Skin:    General: Skin is warm and dry.  Neurological:     General: No focal deficit present.     Mental Status: She is alert.     ED Results / Procedures / Treatments   Labs (all labs ordered are listed, but only abnormal results are displayed) Labs Reviewed  SARS CORONAVIRUS 2 BY RT PCR  GROUP A STREP BY PCR    EKG None  Radiology No results found.  Procedures Procedures    Medications Ordered in ED Medications  dexamethasone (DECADRON) tablet 16 mg (16 mg Oral Given 07/31/21 2317)  ibuprofen (ADVIL) tablet 800 mg (800 mg Oral Given 07/31/21 2317)  lidocaine (XYLOCAINE) 2 %  viscous mouth solution 15 mL (15 mLs Mouth/Throat Given 07/31/21 2317)    ED Course/ Medical Decision Making/ A&P                           Medical Decision Making Risk Prescription drug management.   Suspect the likely viral versus allergic fluid effusion in her ear does look to be infected this time but a wait-and-see antibiotic for prescription was provided.  No evidence of airway obstruction, dysphagia, Ludwig's.  Decadron here for symptoms and lidocaine as needed at home.  PCP follow-up as needed if not improving.   Final Clinical Impression(s) / ED Diagnoses Final diagnoses:  Fluid level behind tympanic membrane of left ear  Pharyngitis, unspecified etiology    Rx / DC Orders ED Discharge Orders          Ordered    lidocaine (XYLOCAINE) 2 % solution  As needed        07/31/21 2307    amoxicillin (AMOXIL) 500 MG  capsule  3 times daily        07/31/21 2307              Everly Rubalcava, Corene Cornea, MD 08/01/21 0451

## 2021-11-18 ENCOUNTER — Emergency Department (HOSPITAL_COMMUNITY)
Admission: EM | Admit: 2021-11-18 | Discharge: 2021-11-18 | Discharge status: Against Medical Advice | Payer: BC Managed Care – PPO | Attending: Emergency Medicine | Admitting: Emergency Medicine

## 2021-11-18 ENCOUNTER — Encounter (HOSPITAL_COMMUNITY): Payer: Self-pay

## 2021-11-18 ENCOUNTER — Other Ambulatory Visit: Payer: Self-pay

## 2021-11-18 DIAGNOSIS — Z5321 Procedure and treatment not carried out due to patient leaving prior to being seen by health care provider: Secondary | ICD-10-CM | POA: Insufficient documentation

## 2021-11-18 DIAGNOSIS — R1032 Left lower quadrant pain: Secondary | ICD-10-CM | POA: Insufficient documentation

## 2021-11-18 DIAGNOSIS — R110 Nausea: Secondary | ICD-10-CM | POA: Insufficient documentation

## 2021-11-18 LAB — COMPREHENSIVE METABOLIC PANEL
ALT: 19 U/L (ref 0–44)
AST: 18 U/L (ref 15–41)
Albumin: 3.9 g/dL (ref 3.5–5.0)
Alkaline Phosphatase: 71 U/L (ref 38–126)
Anion gap: 5 (ref 5–15)
BUN: 8 mg/dL (ref 6–20)
CO2: 25 mmol/L (ref 22–32)
Calcium: 9.2 mg/dL (ref 8.9–10.3)
Chloride: 107 mmol/L (ref 98–111)
Creatinine, Ser: 0.64 mg/dL (ref 0.44–1.00)
GFR, Estimated: >60 mL/min (ref >60)
Glucose, Bld: 95 mg/dL (ref 70–99)
Potassium: 3.8 mmol/L (ref 3.5–5.1)
Sodium: 137 mmol/L (ref 135–145)
Total Bilirubin: 0.6 mg/dL (ref 0.3–1.2)
Total Protein: 7.4 g/dL (ref 6.5–8.1)

## 2021-11-18 LAB — URINALYSIS, ROUTINE W REFLEX MICROSCOPIC
Bacteria, UA: NONE SEEN
Bilirubin Urine: NEGATIVE
Glucose, UA: NEGATIVE mg/dL
Ketones, ur: NEGATIVE mg/dL
Leukocytes,Ua: NEGATIVE
Nitrite: NEGATIVE
Protein, ur: NEGATIVE mg/dL
Specific Gravity, Urine: 1.01 (ref 1.005–1.030)
pH: 5 (ref 5.0–8.0)

## 2021-11-18 LAB — CBC
HCT: 36.4 % (ref 36.0–46.0)
Hemoglobin: 12.6 g/dL (ref 12.0–15.0)
MCH: 28.1 pg (ref 26.0–34.0)
MCHC: 34.6 g/dL (ref 30.0–36.0)
MCV: 81.3 fL (ref 80.0–100.0)
Platelets: 290 10*3/uL (ref 150–400)
RBC: 4.48 MIL/uL (ref 3.87–5.11)
RDW: 14.6 % (ref 11.5–15.5)
WBC: 6.9 10*3/uL (ref 4.0–10.5)
nRBC: 0 % (ref 0.0–0.2)

## 2021-11-18 LAB — LIPASE, BLOOD: Lipase: 25 U/L (ref 11–51)

## 2021-11-18 NOTE — ED Triage Notes (Signed)
Pt presents to ED with complaints of left lower abdominal pain radiating to back, report nausea also, denies vomiting/diarrhea. Pt reports pressure when voiding.

## 2021-11-19 ENCOUNTER — Encounter: Payer: Self-pay | Admitting: Emergency Medicine

## 2021-11-19 ENCOUNTER — Ambulatory Visit
Admission: EM | Admit: 2021-11-19 | Discharge: 2021-11-19 | Disposition: A | Discharge status: Home / Self Care | Payer: BC Managed Care – PPO | Attending: Nurse Practitioner | Admitting: Nurse Practitioner

## 2021-11-19 DIAGNOSIS — Z113 Encounter for screening for infections with a predominantly sexual mode of transmission: Secondary | ICD-10-CM | POA: Insufficient documentation

## 2021-11-19 DIAGNOSIS — R319 Hematuria, unspecified: Secondary | ICD-10-CM

## 2021-11-19 DIAGNOSIS — Z8616 Personal history of COVID-19: Secondary | ICD-10-CM | POA: Diagnosis not present

## 2021-11-19 DIAGNOSIS — R350 Frequency of micturition: Secondary | ICD-10-CM

## 2021-11-19 DIAGNOSIS — N941 Unspecified dyspareunia: Secondary | ICD-10-CM

## 2021-11-19 DIAGNOSIS — R109 Unspecified abdominal pain: Secondary | ICD-10-CM | POA: Diagnosis not present

## 2021-11-19 DIAGNOSIS — F17210 Nicotine dependence, cigarettes, uncomplicated: Secondary | ICD-10-CM | POA: Diagnosis not present

## 2021-11-19 DIAGNOSIS — F17290 Nicotine dependence, other tobacco product, uncomplicated: Secondary | ICD-10-CM | POA: Diagnosis not present

## 2021-11-19 DIAGNOSIS — R10A2 Flank pain, left side: Secondary | ICD-10-CM

## 2021-11-19 LAB — POCT URINALYSIS DIP (MANUAL ENTRY)
Bilirubin, UA: NEGATIVE
Glucose, UA: NEGATIVE mg/dL
Ketones, POC UA: NEGATIVE mg/dL
Leukocytes, UA: NEGATIVE
Nitrite, UA: NEGATIVE
Protein Ur, POC: NEGATIVE mg/dL
Spec Grav, UA: 1.02 (ref 1.010–1.025)
Urobilinogen, UA: 0.2 E.U./dL
pH, UA: 6 (ref 5.0–8.0)

## 2021-11-19 MED ORDER — ONDANSETRON 4 MG PO TBDP: 4.0000 mg | ORAL_TABLET | Freq: Three times a day (TID) | ORAL | 0 refills | Status: DC | PRN

## 2021-11-19 MED ORDER — TAMSULOSIN HCL 0.4 MG PO CAPS: 0.4000 mg | ORAL_CAPSULE | Freq: Every day | ORAL | 0 refills | Status: DC

## 2021-11-19 MED ORDER — IBUPROFEN 800 MG PO TABS: 800.0000 mg | ORAL_TABLET | Freq: Three times a day (TID) | ORAL | 0 refills | Status: DC | PRN

## 2021-11-19 NOTE — Discharge Instructions (Addendum)
Urine sample today shows blood; with the back pain you are having, I thank you may have a kidney stone  Kidney stones typically pass on their own after a few days.  Make sure you are drinking plenty of water.  You can start Flomax which is a medicine to help relax the urinary muscles.  You can also take the Zofran which is nausea medicine every 8 hours as needed for nausea.  You can take Tylenol 500 to 1000 mg every 6 hours or ibuprofen 800 mg every 8 hours as needed for pain.  We will call you later this week if any of the testing from today comes back positive.  Go back to the emergency room if you develop severe pain in your back or abdomen, nausea/vomiting and are unable to keep fluids down, or high fevers more than 102.

## 2021-11-19 NOTE — ED Triage Notes (Addendum)
Pt reports lower back pain that radiates to lower abdomen,bilateral hip pain, pressure with urination. Denies any known fevers but reports intermittent nausea and hematuria. Also reports intercourse was painful.  Pt reports was seen for same at [facility] and reports urine and lab work was completed and abnormal but left prior to seeing a provider due to increased wait time.

## 2021-11-19 NOTE — ED Provider Notes (Signed)
[facility]    CSN: [id number] Arrival date & time: 11/19/21  0915      History   Chief Complaint Chief Complaint  Patient presents with   Back Pain    HPI Misty Murillo is a 31 y.o. female.   Patient presents with 2 weeks of back pain that has now wrapped around her stomach, bloating, increased urinary frequency and urgency, on and off hematuria, urinary pressure, and some nausea.  She denies dysuria, voiding smaller amounts, new urinary incontinence, change in the odor of her urine, and vomiting.  No vaginal discharge, vaginal sores or lesions.  No swelling in her groin.  She has tried cranberry juice for her symptoms.  Reports she was in the emergency room last night, however could not continue waiting to be seen and so she left.  Last menstrual period was a couple of weeks ago.  Patient is also requesting STI testing today.  Patient also endorses painful sexual intercourse over the weekend.    Past Medical History:  Diagnosis Date   Anxiety    Depression    denies   Fibroid    Graves disease    Headache    Liver mass    Migraines    PID (acute pelvic inflammatory disease) 07/2019   Pregnancy induced hypertension     Patient Active Problem List   Diagnosis Date Noted   Encounter for initial prescription of contraceptive pills 12/28/2020   Screen for STD (sexually transmitted disease) 12/28/2020   Routine cervical smear 12/28/2020   Irregular periods 12/28/2020   History of gestational hypertension 06/06/2020   Pneumonia due to COVID-19 virus 02/14/2020   Sepsis ([place]) 02/14/2020   Acute hypoxemic respiratory failure ([place]) 02/14/2020   Intractable nausea and vomiting 02/14/2020   Diarrhea 02/14/2020   Hypokalemia 01/17/2020   Fibroid    Graves' disease 08/03/2018   Hyperthyroidism 03/08/2018   Vitamin D deficiency 03/08/2018   Chronic urticaria 07/11/2017   Angioedema 07/11/2017   Sickle cell trait ([place]) 05/26/2017   Liver mass, left lobe  05/18/2017    Past Surgical History:  Procedure Laterality Date   CHOLECYSTECTOMY     RESECTION LIVER     hemangioma    OB History     Gravida  3   Para  2   Term  1   Preterm  1   AB  1   Living  3      SAB  0   IAB  1   Ectopic  0   Multiple  1   Live Births  3            Home Medications    Prior to Admission medications   Medication Sig Start Date End Date Taking? Authorizing Provider  ibuprofen (ADVIL) 800 MG tablet Take 1 tablet (800 mg total) by mouth every 8 (eight) hours as needed for moderate pain. Take with food to prevent GI upset 11/19/21  Yes [name] A, NP  ondansetron (ZOFRAN-ODT) 4 MG disintegrating tablet Take 1 tablet (4 mg total) by mouth every 8 (eight) hours as needed for nausea or vomiting. 11/19/21  Yes Eulogio Bear, NP  tamsulosin (FLOMAX) 0.4 MG CAPS capsule Take 1 capsule (0.4 mg total) by mouth daily after supper. 11/19/21  Yes [name] A, NP  EPIPEN 2-PAK 0.3 MG/0.3ML SOAJ injection Inject into the muscle. 01/17/21   [provider]    Family History Family History  Problem Relation Age of Onset  Cancer Maternal Grandmother    Cancer Maternal Grandfather    Migraines Father    Hypertension Father    Diabetes Mother    Hyperthyroidism Mother    Hyperthyroidism Brother    Hypertension Brother    Cancer Other     Social History Social History   Tobacco Use   Smoking status: Every Day    Packs/day: 0.00    Types: Cigars, Cigarettes    Last attempt to quit: 01/27/2018    Years since quitting: 3.8   Smokeless tobacco: Never  Vaping Use   Vaping Use: Never used  Substance Use Topics   Alcohol use: Yes    Comment: occasionally   Drug use: Not Currently    Types: Marijuana    Comment: last used mid October     Allergies   Patient has no known allergies.   Review of Systems Review of Systems Per HPI  Physical Exam Triage Vital Signs ED Triage Vitals  Enc Vitals Group      BP 11/19/21 1020 117/82     Pulse Rate 11/19/21 1020 71     Resp 11/19/21 1020 20     Temp 11/19/21 1020 98.5 F (36.9 C)     Temp Source 11/19/21 1020 Oral     SpO2 11/19/21 1020 98 %     Weight --      Height --      Head Circumference --      Peak Flow --      Pain Score 11/19/21 1015 6     Pain Loc --      Pain Edu? --      Excl. in [place]? --    No data found.  Updated Vital Signs BP 117/82 (BP Location: Right Arm)   Pulse 71   Temp 98.5 F (36.9 C) (Oral)   Resp 20   LMP 11/01/2021 (Approximate)   SpO2 98%   Breastfeeding No   Visual Acuity Right Eye Distance:   Left Eye Distance:   Bilateral Distance:    Right Eye Near:   Left Eye Near:    Bilateral Near:     Physical Exam Vitals and nursing note reviewed.  Constitutional:      General: She is not in acute distress.    Appearance: She is not toxic-appearing.  HENT:     Mouth/Throat:     Mouth: Mucous membranes are moist.     Pharynx: Oropharynx is clear.  Cardiovascular:     Rate and Rhythm: Normal rate and regular rhythm.  Pulmonary:     Effort: Pulmonary effort is normal. No respiratory distress.     Breath sounds: Normal breath sounds. No wheezing, rhonchi or rales.  Abdominal:     General: Abdomen is flat. Bowel sounds are normal. There is no distension.     Palpations: Abdomen is soft. There is no mass.     Tenderness: There is no abdominal tenderness. There is left CVA tenderness. There is no right CVA tenderness, guarding or rebound.  Genitourinary:    Comments: Deferred-self swab performed Skin:    General: Skin is warm and dry.     Coloration: Skin is not jaundiced or pale.     Findings: No erythema.  Neurological:     Mental Status: She is alert and oriented to person, place, and time.     Motor: No weakness.     Gait: Gait normal.  Psychiatric:        Behavior: Behavior is cooperative.  UC Treatments / Results  Labs (all labs ordered are listed, but only abnormal results  are displayed) Labs Reviewed  POCT URINALYSIS DIP (MANUAL ENTRY) - Abnormal; Notable for the following components:      Result Value   Blood, UA trace-intact (*)    All other components within normal limits  URINE CULTURE  HIV ANTIBODY (ROUTINE TESTING W REFLEX)  RPR  CERVICOVAGINAL ANCILLARY ONLY    EKG   Radiology No results found.  Procedures Procedures (including critical care time)  Medications Ordered in UC Medications - No data to display  Initial Impression / Assessment and Plan / UC Course  I have reviewed the triage vital signs and the nursing notes.  Pertinent labs & imaging results that were available during my care of the patient were reviewed by me and considered in my medical decision making (see chart for details).   Patient is well-appearing, normotensive, afebrile, not tachycardic, not tachypneic, oxygenating well on room air.    Dyspareunia in female Without vaginal discharge, self swab obtained to check for gonorrhea, chlamydia, trichomonas.  We will also check for BV and yeast infection. HIV and syphilis testing performed. Treat as indicated Encouraged condom use with every sexual encounter  Urinary frequency Hematuria, unspecified type Left flank pain Urinalysis today shows trace amount of blood Given left CVA tenderness, hematuria, suspect kidney stone Treat with increasing hydration, straining urine, Flomax, Zofran as needed for nausea, and ibuprofen for pain Strict ER precautions discussed  The patient was given the opportunity to ask questions.  All questions answered to their satisfaction.  The patient is in agreement to this plan.    Final Clinical Impressions(s) / UC Diagnoses   Final diagnoses:  Dyspareunia in female  Urinary frequency  Hematuria, unspecified type  Left flank pain     Discharge Instructions      Urine sample today shows blood; with the back pain you are having, I thank you may have a kidney stone  Kidney  stones typically pass on their own after a few days.  Make sure you are drinking plenty of water.  You can start Flomax which is a medicine to help relax the urinary muscles.  You can also take the Zofran which is nausea medicine every 8 hours as needed for nausea.  You can take Tylenol 500 to 1000 mg every 6 hours or ibuprofen 800 mg every 8 hours as needed for pain.  We will call you later this week if any of the testing from today comes back positive.  Go back to the emergency room if you develop severe pain in your back or abdomen, nausea/vomiting and are unable to keep fluids down, or high fevers more than 102.     ED Prescriptions     Medication Sig Dispense Auth. Provider   tamsulosin (FLOMAX) 0.4 MG CAPS capsule Take 1 capsule (0.4 mg total) by mouth daily after supper. 30 capsule [name] A, NP   ondansetron (ZOFRAN-ODT) 4 MG disintegrating tablet Take 1 tablet (4 mg total) by mouth every 8 (eight) hours as needed for nausea or vomiting. 20 tablet [name] A, NP   ibuprofen (ADVIL) 800 MG tablet Take 1 tablet (800 mg total) by mouth every 8 (eight) hours as needed for moderate pain. Take with food to prevent GI upset 21 tablet Eulogio Bear, NP      PDMP not reviewed this encounter.   Eulogio Bear, NP 11/19/21 1515

## 2021-11-20 ENCOUNTER — Telehealth (HOSPITAL_COMMUNITY): Payer: Self-pay | Admitting: Emergency Medicine

## 2021-11-20 LAB — CERVICOVAGINAL ANCILLARY ONLY
Bacterial Vaginitis (gardnerella): POSITIVE — AB
Candida Glabrata: NEGATIVE
Candida Vaginitis: NEGATIVE
Chlamydia: NEGATIVE
Comment: NEGATIVE
Comment: NEGATIVE
Comment: NEGATIVE
Comment: NEGATIVE
Comment: NEGATIVE
Comment: NORMAL
Neisseria Gonorrhea: NEGATIVE
Trichomonas: NEGATIVE

## 2021-11-20 LAB — HIV ANTIBODY (ROUTINE TESTING W REFLEX): HIV Screen 4th Generation wRfx: NONREACTIVE

## 2021-11-20 LAB — URINE CULTURE: Culture: NO GROWTH

## 2021-11-20 LAB — RPR: RPR Ser Ql: NONREACTIVE

## 2021-11-20 MED ORDER — METRONIDAZOLE 500 MG PO TABS: 500.0000 mg | ORAL_TABLET | Freq: Two times a day (BID) | ORAL | 0 refills | Status: DC

## 2021-11-22 ENCOUNTER — Telehealth: Payer: Self-pay

## 2021-11-22 MED ORDER — ONDANSETRON 4 MG PO TBDP: 4.0000 mg | ORAL_TABLET | Freq: Three times a day (TID) | ORAL | 0 refills | Status: DC | PRN

## 2021-11-22 MED ORDER — TAMSULOSIN HCL 0.4 MG PO CAPS: 0.4000 mg | ORAL_CAPSULE | Freq: Every day | ORAL | 0 refills | Status: DC

## 2021-11-22 MED ORDER — IBUPROFEN 800 MG PO TABS
800.0000 mg | ORAL_TABLET | Freq: Three times a day (TID) | ORAL | 0 refills | Status: DC | PRN
Start: 2021-11-22 — End: 2022-01-06

## 2021-11-22 MED ORDER — METRONIDAZOLE 500 MG PO TABS: 500.0000 mg | ORAL_TABLET | Freq: Two times a day (BID) | ORAL | 0 refills | Status: DC

## 2021-11-22 NOTE — Telephone Encounter (Signed)
Pt needed her meds to go to [facility] [address]

## 2021-12-04 ENCOUNTER — Other Ambulatory Visit: Payer: Self-pay

## 2021-12-04 ENCOUNTER — Emergency Department (HOSPITAL_COMMUNITY)
Admission: EM | Admit: 2021-12-04 | Discharge: 2021-12-04 | Disposition: A | Discharge status: Home / Self Care | Payer: BC Managed Care – PPO | Attending: Emergency Medicine | Admitting: Emergency Medicine

## 2021-12-04 ENCOUNTER — Encounter (HOSPITAL_COMMUNITY): Payer: Self-pay | Admitting: Emergency Medicine

## 2021-12-04 DIAGNOSIS — R002 Palpitations: Secondary | ICD-10-CM | POA: Insufficient documentation

## 2021-12-04 DIAGNOSIS — R531 Weakness: Secondary | ICD-10-CM | POA: Insufficient documentation

## 2021-12-04 DIAGNOSIS — R112 Nausea with vomiting, unspecified: Secondary | ICD-10-CM | POA: Diagnosis not present

## 2021-12-04 DIAGNOSIS — R42 Dizziness and giddiness: Secondary | ICD-10-CM | POA: Insufficient documentation

## 2021-12-04 LAB — CBC
HCT: 35.6 % — ABNORMAL LOW (ref 36.0–46.0)
Hemoglobin: 12.2 g/dL (ref 12.0–15.0)
MCH: 27.9 pg (ref 26.0–34.0)
MCHC: 34.3 g/dL (ref 30.0–36.0)
MCV: 81.5 fL (ref 80.0–100.0)
Platelets: 293 10*3/uL (ref 150–400)
RBC: 4.37 MIL/uL (ref 3.87–5.11)
RDW: 14.1 % (ref 11.5–15.5)
WBC: 7.7 10*3/uL (ref 4.0–10.5)
nRBC: 0.4 % — ABNORMAL HIGH (ref 0.0–0.2)

## 2021-12-04 LAB — BASIC METABOLIC PANEL
Anion gap: 8 (ref 5–15)
BUN: 12 mg/dL (ref 6–20)
CO2: 23 mmol/L (ref 22–32)
Calcium: 8.8 mg/dL — ABNORMAL LOW (ref 8.9–10.3)
Chloride: 107 mmol/L (ref 98–111)
Creatinine, Ser: 0.9 mg/dL (ref 0.44–1.00)
GFR, Estimated: >60 mL/min (ref >60)
Glucose, Bld: 126 mg/dL — ABNORMAL HIGH (ref 70–99)
Potassium: 3.3 mmol/L — ABNORMAL LOW (ref 3.5–5.1)
Sodium: 138 mmol/L (ref 135–145)

## 2021-12-04 LAB — URINALYSIS, ROUTINE W REFLEX MICROSCOPIC
Bacteria, UA: NONE SEEN
Bilirubin Urine: NEGATIVE
Glucose, UA: NEGATIVE mg/dL
Ketones, ur: NEGATIVE mg/dL
Leukocytes,Ua: NEGATIVE
Nitrite: NEGATIVE
Protein, ur: NEGATIVE mg/dL
Specific Gravity, Urine: 1.011 (ref 1.005–1.030)
pH: 6 (ref 5.0–8.0)

## 2021-12-04 LAB — T4, FREE: Free T4: 0.93 ng/dL (ref 0.61–1.12)

## 2021-12-04 LAB — PREGNANCY, URINE: Preg Test, Ur: NEGATIVE

## 2021-12-04 LAB — TSH: TSH: 0.972 u[IU]/mL (ref 0.350–4.500)

## 2021-12-04 MED ORDER — MECLIZINE HCL 12.5 MG PO TABS
12.5000 mg | ORAL_TABLET | Freq: Once | ORAL | Status: AC
  Administered 2021-12-04: 12.5 mg via ORAL
  Filled 2021-12-04: qty 1

## 2021-12-04 MED ORDER — MECLIZINE HCL 25 MG PO TABS: 25.0000 mg | ORAL_TABLET | Freq: Three times a day (TID) | ORAL | 0 refills | Status: DC | PRN

## 2021-12-04 NOTE — ED Triage Notes (Signed)
Pt called ems due to feeling dizzy, nausea, felt like hr up and down and got really tired. N/v x 1 Cbg 142 en route. Denies diarrhea. Denies cp or tightness. Arrived a/o. Nad. Mm wet

## 2021-12-04 NOTE — ED Provider Notes (Signed)
[facility] EMERGENCY DEPARTMENT Provider Note   CSN: [id number] Arrival date & time: 12/04/21  1148     History  Chief Complaint  Patient presents with   Vomiting   Dizziness   HPI Misty Murillo is a 31 y.o. female with Graves disease presenting for palpitations and dizziness.  States that about an hour ago she started to feel her "heart flutter".  It was intermittent and lasted about 10 to 15 minutes.  Around that time she also started to feel dizzy.  Denies room spinning sensation.  Stated that her legs were generally weak and she was lightheaded.  Her mother called EMS and she was transferred here for evaluation.  Denies fever, vomiting diarrhea.  Does endorse nausea.  States that she has been eating and drinking normally.  States that for the last 2 years she has not taken anything for Graves' disease because of "her levels have been normal".   Dizziness      Home Medications Prior to Admission medications   Medication Sig Start Date End Date Taking? Authorizing Provider  ibuprofen (ADVIL) 800 MG tablet Take 1 tablet (800 mg total) by mouth every 8 (eight) hours as needed for moderate pain. Take with food to prevent GI upset Patient not taking: Reported on 12/04/2021 11/22/21   Eulogio Bear, NP  metroNIDAZOLE (FLAGYL) 500 MG tablet Take 1 tablet (500 mg total) by mouth 2 (two) times daily. Patient not taking: Reported on 12/04/2021 11/22/21   Volney American, PA-C  ondansetron (ZOFRAN-ODT) 4 MG disintegrating tablet Take 1 tablet (4 mg total) by mouth every 8 (eight) hours as needed for nausea or vomiting. Patient not taking: Reported on 12/04/2021 11/22/21   Eulogio Bear, NP  tamsulosin (FLOMAX) 0.4 MG CAPS capsule Take 1 capsule (0.4 mg total) by mouth daily after supper. Patient not taking: Reported on 12/04/2021 11/22/21   Eulogio Bear, NP      Allergies    Patient has no known allergies.    Review of Systems   Review of Systems   Neurological:  Positive for dizziness.    Physical Exam Updated Vital Signs BP 109/63   Pulse 64   Temp 98 F (36.7 C) (Oral)   Resp 16   LMP 11/29/2021 (Approximate)   SpO2 98%  Physical Exam Vitals and nursing note reviewed.  HENT:     Head: Normocephalic and atraumatic.     Mouth/Throat:     Mouth: Mucous membranes are moist.  Eyes:     General:        Right eye: No discharge.        Left eye: No discharge.     Conjunctiva/sclera: Conjunctivae normal.  Cardiovascular:     Rate and Rhythm: Normal rate and regular rhythm.     Pulses: Normal pulses.     Heart sounds: Normal heart sounds.  Pulmonary:     Effort: Pulmonary effort is normal.     Breath sounds: Normal breath sounds.  Abdominal:     General: Abdomen is flat.     Palpations: Abdomen is soft.  Skin:    General: Skin is warm and dry.  Neurological:     General: No focal deficit present.     Comments: GCS 15. Speech is goal oriented. No deficits appreciated to CN III-XII; symmetric eyebrow raise, no facial drooping, tongue midline. Patient has equal grip strength bilaterally with 5/5 strength against resistance in all major muscle groups bilaterally. Sensation to light touch intact.  Patient moves extremities without ataxia. Normal finger-nose-finger. Patient ambulatory with steady gait but stated that her legs were starting to feel weak and requested that she return back to the bed.   Psychiatric:        Mood and Affect: Mood normal.     ED Results / Procedures / Treatments   Labs (all labs ordered are listed, but only abnormal results are displayed) Labs Reviewed  BASIC METABOLIC PANEL - Abnormal; Notable for the following components:      Result Value   Potassium 3.3 (*)    Glucose, Bld 126 (*)    Calcium 8.8 (*)    All other components within normal limits  CBC - Abnormal; Notable for the following components:   HCT 35.6 (*)    nRBC 0.4 (*)    All other components within normal limits   URINALYSIS, ROUTINE W REFLEX MICROSCOPIC - Abnormal; Notable for the following components:   Hgb urine dipstick SMALL (*)    All other components within normal limits  PREGNANCY, URINE  TSH  T4, FREE  T3, FREE    EKG None  Radiology No results found.  Procedures Procedures    Medications Ordered in ED Medications - No data to display  ED Course/ Medical Decision Making/ A&P                           Medical Decision Making Amount and/or Complexity of Data Reviewed Labs: ordered.   This patient presents to the ED for concern of heart palpitations and dizziness, this involves a number of treatment options, and is a complaint that carries with it a high risk of complications and morbidity.  The differential diagnosis includes arrhythmia, electrolyte derangement, intracranial pathology anemia, thyroid storm and ACSs.   Co morbidities: Discussed in HPI   EMR reviewed including pt PMHx, past surgical history and past visits to ER.   See HPI for more details   Lab Tests:   I viewed and independently interpreted labs. Labs notable for hypokalemia, hyperglycemia   Imaging Studies:  No imaging studies ordered for this patient    Cardiac Monitoring:  The patient was maintained on a cardiac monitor.  I personally viewed and interpreted the cardiac monitored which showed an underlying rhythm of:NSR EKG non-ischemic   Medicines ordered:  No medications ordered. Reevaluation of the patient after these medicines showed that the patient improved I have reviewed the patients home medicines and have made adjustments as needed     Consults/Attending Physician   I discussed this case with my attending physician who cosigned this note including patient's presenting symptoms, physical exam, and planned diagnostics and interventions. Attending physician stated agreement with plan or made changes to plan which were implemented.   Reevaluation:  After the  interventions noted above I re-evaluated patient and found that they have :improved  Problem List / ED Course:  Presented for heart palpitation and dizziness.  On exam, patient had normal vitals with overall reassuring exam but noted unsteady gate. Did consider thyroid storm but unlikely given patient not tachycardic, hypertensive with a normal TSH.  Considered anemia but unlikely given hemoglobin 12.2.  Considered other intracranial pathology but unlikely given reassuring neuro exam with no focal deficit on.  Considered arrhythmia but EKG: Normal sinus rhythm.  Electrolytes were overall reassuring.  Advised patient to follow-up with cardiology. Treated dizziness with meclizine. Upon reevaluation patient was able to ambulate around the department and stated that she  felt much better.  Discharged advised that she follow-up with her PCP. Discussed return precautions.   Dispostion:  After consideration of the diagnostic results and the patients response to treatment, I feel that the patient would benefit from discharge and follow-up with her PCP for ongoing dizziness and follow-up with cardiology for palpitations.         Final Clinical Impression(s) / ED Diagnoses Final diagnoses:  Dizziness  Palpitations    Rx / DC Orders ED Discharge Orders     None         Harriet Pho, PA-C 12/04/21 1738    Milton Ferguson, MD 12/06/21 [phone]

## 2021-12-04 NOTE — Discharge Instructions (Addendum)
Evaluation for your heart palpitations and dizziness was overall reassuring.  Electrolytes are within normal limits, EKG was normal.  TSH level was also normal.  Advised the follow-up for T3 and T4 levels.  Also advised follow-up with cardiology given your intermittent palpitations. You were in normal sinus rhythm during this encounter.  Did consider thyroid storm related to her Graves' disease but unlikely given that you are not tachycardic, normal blood pressure and normal sinus rhythm. If you have new visual disturbance, new weakness or numbness in your extremities, facial droop, slurred speech, chest pain or shortness of breath please return to the emergency department for further evaluation.

## 2021-12-06 LAB — T3, FREE: T3, Free: 2.9 pg/mL (ref 2.0–4.4)

## 2021-12-11 ENCOUNTER — Ambulatory Visit: Payer: BC Managed Care – PPO | Admitting: Internal Medicine

## 2021-12-24 ENCOUNTER — Ambulatory Visit: Payer: BC Managed Care – PPO | Admitting: Internal Medicine

## 2022-01-06 ENCOUNTER — Encounter: Payer: Self-pay | Admitting: Internal Medicine

## 2022-01-06 ENCOUNTER — Ambulatory Visit (INDEPENDENT_AMBULATORY_CARE_PROVIDER_SITE_OTHER): Payer: BC Managed Care – PPO | Admitting: Internal Medicine

## 2022-01-06 DIAGNOSIS — E559 Vitamin D deficiency, unspecified: Secondary | ICD-10-CM

## 2022-01-06 DIAGNOSIS — N912 Amenorrhea, unspecified: Secondary | ICD-10-CM

## 2022-01-06 DIAGNOSIS — F321 Major depressive disorder, single episode, moderate: Secondary | ICD-10-CM | POA: Diagnosis not present

## 2022-01-06 DIAGNOSIS — R160 Hepatomegaly, not elsewhere classified: Secondary | ICD-10-CM

## 2022-01-06 DIAGNOSIS — E0500 Thyrotoxicosis with diffuse goiter without thyrotoxic crisis or storm: Secondary | ICD-10-CM | POA: Diagnosis not present

## 2022-01-06 LAB — POCT URINE PREGNANCY: Preg Test, Ur: POSITIVE — AB

## 2022-01-06 MED ORDER — ESCITALOPRAM OXALATE 10 MG PO TABS: 10.0000 mg | ORAL_TABLET | Freq: Every day | ORAL | 1 refills | Status: DC

## 2022-01-06 NOTE — Patient Instructions (Signed)
Thank you for trusting me with your care. Below are the following orders placed today.  Labs: - VITAMIN D 25 Hydroxy (Vit-D Deficiency, Fractures) - CMP14+EGFR - Lipid panel - Hemoglobin A1C  Referrals: - Ambulatory referral to [place]  Medications: - escitalopram (LEXAPRO) 10 MG tablet; Take 1 tablet (10 mg total) by mouth daily.  Dispense: 30 tablet; Refill: 1

## 2022-01-06 NOTE — Progress Notes (Unsigned)
CC: establish care, depression, amenorrhea    HPI:[name] is a 31 y.o. female with history of hyperthyroidism secondary to mild graves disease, liver mass, and gestational hypertension who presents for evaluation of  depression,amenorrhea, and to establish care.   Patient has depressed mood. These feelings are almost every day and she has lost pleasure in normal activities. Her PHQ-9 was 3, but we discussed further and she has a hard time admitting feeling she is having. These feelings started after having her twins in May of 2022. She is tearful during exam. No manic or hypomanic episodes. Patient has had some nausea recently and period is late.   Patient had diagnosis of hyperthyroidism secondary to mild grave disease. Followed by endocrinology initially, treated with methimazole for months and then at follow up her thyroid levels were normal. Endocrine signed off in 2022 after consistent normal thyroid levels.  History of liver mass. Surgical resection of small lesion in 2019 consistent with FNH. Last imaging 02/20/2020.    Past Medical History:  Diagnosis Date   Anxiety    Depression    denies   Fibroid    Graves disease    Headache    Liver mass    Migraines    PID (acute pelvic inflammatory disease) 07/2019   Pregnancy induced hypertension     Past Surgical History:  Procedure Laterality Date   CHOLECYSTECTOMY     RESECTION LIVER     hemangioma    Family History  Problem Relation Age of Onset   Cancer Maternal Grandmother    Cancer Maternal Grandfather    Migraines Father    Hypertension Father    Diabetes Mother    Hyperthyroidism Mother    Hyperthyroidism Brother    Hypertension Brother    Cancer Other     Social History   Tobacco Use   Smoking status: Every Day    Packs/day: 0.00    Types: Cigars, Cigarettes    Last attempt to quit: 01/27/2018    Years since quitting: 3.9   Smokeless tobacco: Never  Vaping Use   Vaping Use: Never used   Substance Use Topics   Alcohol use: Yes    Comment: occasionally   Drug use: Not Currently    Types: Marijuana    Comment: last used mid October   Physical Exam: Vitals:   01/06/22 0902  BP: 123/82  Pulse: 90  SpO2: 98%  Weight: 250 lb (113.4 kg)  Height: '5\' 4"'$  (1.626 m)     Physical Exam Constitutional:      Appearance: She is well-developed. She is obese.  Eyes:     General: No scleral icterus.    Conjunctiva/sclera: Conjunctivae normal.  Cardiovascular:     Rate and Rhythm: Normal rate and regular rhythm.     Heart sounds: No murmur heard.    No friction rub.  Pulmonary:     Effort: Pulmonary effort is normal.     Breath sounds: No wheezing, rhonchi or rales.  Psychiatric:        Mood and Affect: Mood is depressed. Affect is tearful.        Behavior: Behavior normal.      Assessment & Plan:   Graves' disease Reviewed TSH and Free T4 checked 11/15 and they were in normal limits.  - Chronic problem, stable. Not requiring treatment.   Vitamin D deficiency History of Vitamin D deficiency. Not currently on treatment. Check lab today, VITAMIN D 25 Hydroxy  Liver mass, left lobe Patients has had repeat imaging multiple times since diagnosis of [place] in 2019 with stable liver mass. Patient is asymptomatic at this time. No surveillance imaging required unless patient becomes symptomatic.  Amenorrhea Patient is not on birth control currently and not using barrier protection during intercourse.  Presents with amenorrhea and recent episodes of nausea.  - Pregnancy test today  Addendum: Patient POC test is positive. She is going to arrange appointment with her Ob/Gyn for prenatal care.   MDD (major depressive disorder) New diagnosis. Patient had positive pregnancy test today. Discussed risk and benefits of treatment with SSRI. Patient would like to start treatment for depression today. She will start Lexapro 10 mg. Referred to integrated behavioral [facility]. Follow up  in 4 weeks.     Misty Dy, MD

## 2022-01-07 ENCOUNTER — Encounter: Payer: Self-pay | Admitting: Internal Medicine

## 2022-01-07 DIAGNOSIS — F329 Major depressive disorder, single episode, unspecified: Secondary | ICD-10-CM | POA: Insufficient documentation

## 2022-01-07 DIAGNOSIS — Z3490 Encounter for supervision of normal pregnancy, unspecified, unspecified trimester: Secondary | ICD-10-CM | POA: Insufficient documentation

## 2022-01-07 DIAGNOSIS — N912 Amenorrhea, unspecified: Secondary | ICD-10-CM | POA: Insufficient documentation

## 2022-01-07 LAB — CMP14+EGFR
ALT: 13 IU/L (ref 0–32)
AST: 10 IU/L (ref 0–40)
Albumin/Globulin Ratio: 1.6 (ref 1.2–2.2)
Albumin: 4.4 g/dL (ref 4.0–5.0)
Alkaline Phosphatase: 78 IU/L (ref 44–121)
BUN/Creatinine Ratio: 7 — ABNORMAL LOW (ref 9–23)
BUN: 5 mg/dL — ABNORMAL LOW (ref 6–20)
Bilirubin Total: 0.4 mg/dL (ref 0.0–1.2)
CO2: 20 mmol/L (ref 20–29)
Calcium: 9.3 mg/dL (ref 8.7–10.2)
Chloride: 105 mmol/L (ref 96–106)
Creatinine, Ser: 0.74 mg/dL (ref 0.57–1.00)
Globulin, Total: 2.7 g/dL (ref 1.5–4.5)
Glucose: 92 mg/dL (ref 70–99)
Potassium: 3.7 mmol/L (ref 3.5–5.2)
Sodium: 138 mmol/L (ref 134–144)
Total Protein: 7.1 g/dL (ref 6.0–8.5)
eGFR: 112 mL/min/{1.73_m2} (ref >59)

## 2022-01-07 LAB — HEMOGLOBIN A1C
Est. average glucose Bld gHb Est-mCnc: 114 mg/dL
Hgb A1c MFr Bld: 5.6 % (ref 4.8–5.6)

## 2022-01-07 LAB — LIPID PANEL
Chol/HDL Ratio: 4.5 ratio — ABNORMAL HIGH (ref 0.0–4.4)
Cholesterol, Total: 200 mg/dL — ABNORMAL HIGH (ref 100–199)
HDL: 44 mg/dL (ref >39)
LDL Chol Calc (NIH): 138 mg/dL — ABNORMAL HIGH (ref 0–99)
Triglycerides: 99 mg/dL (ref 0–149)
VLDL Cholesterol Cal: 18 mg/dL (ref 5–40)

## 2022-01-07 LAB — VITAMIN D 25 HYDROXY (VIT D DEFICIENCY, FRACTURES): Vit D, 25-Hydroxy: 14.1 ng/mL — ABNORMAL LOW (ref 30.0–100.0)

## 2022-01-07 NOTE — Assessment & Plan Note (Addendum)
Patients has had repeat imaging multiple times since diagnosis of [place] in 2019 with stable liver mass. Patient is asymptomatic at this time. No surveillance imaging required unless patient becomes symptomatic.

## 2022-01-07 NOTE — Assessment & Plan Note (Signed)
History of Vitamin D deficiency. Not currently on treatment. Check lab today, VITAMIN D 25 Hydroxy

## 2022-01-07 NOTE — Assessment & Plan Note (Signed)
New diagnosis. Patient had positive pregnancy test today. Discussed risk and benefits of treatment with SSRI. Patient would like to start treatment for depression today. She will start Lexapro 10 mg. Referred to integrated behavioral [facility]. Follow up in 4 weeks.

## 2022-01-07 NOTE — Assessment & Plan Note (Signed)
Reviewed TSH and Free T4 checked 11/15 and they were in normal limits.  - Chronic problem, stable. Not requiring treatment.

## 2022-01-07 NOTE — Assessment & Plan Note (Signed)
Patient is not on birth control currently and not using barrier protection during intercourse.  Presents with amenorrhea and recent episodes of nausea.  - Pregnancy test today  Addendum: Patient POC test is positive. She is going to arrange appointment with her Ob/Gyn for prenatal care.

## 2022-01-09 ENCOUNTER — Other Ambulatory Visit: Payer: Self-pay | Admitting: Internal Medicine

## 2022-01-09 MED ORDER — VITAMIN D3 25 MCG (1000 UT) PO CAPS: 1000.0000 [IU] | ORAL_CAPSULE | Freq: Every day | ORAL | 0 refills | Status: DC

## 2022-01-15 ENCOUNTER — Encounter: Payer: Self-pay | Admitting: Internal Medicine

## 2022-01-16 ENCOUNTER — Other Ambulatory Visit: Payer: Self-pay | Admitting: Family Medicine

## 2022-01-16 DIAGNOSIS — R112 Nausea with vomiting, unspecified: Secondary | ICD-10-CM

## 2022-01-16 MED ORDER — ONDANSETRON HCL 4 MG PO TABS: 4.0000 mg | ORAL_TABLET | Freq: Three times a day (TID) | ORAL | 0 refills | Status: DC | PRN

## 2022-01-20 HISTORY — PX: DILATION AND CURETTAGE OF UTERUS: SHX78

## 2022-01-27 ENCOUNTER — Ambulatory Visit: Payer: BC Managed Care – PPO | Admitting: Family Medicine

## 2022-02-05 ENCOUNTER — Ambulatory Visit: Payer: BC Managed Care – PPO | Admitting: Internal Medicine

## 2022-02-05 ENCOUNTER — Ambulatory Visit: Payer: BC Managed Care – PPO | Admitting: Family Medicine

## 2022-02-10 ENCOUNTER — Encounter: Payer: Self-pay | Admitting: Internal Medicine

## 2022-02-10 ENCOUNTER — Ambulatory Visit (INDEPENDENT_AMBULATORY_CARE_PROVIDER_SITE_OTHER): Payer: BC Managed Care – PPO | Admitting: Internal Medicine

## 2022-02-10 DIAGNOSIS — G8929 Other chronic pain: Secondary | ICD-10-CM | POA: Insufficient documentation

## 2022-02-10 DIAGNOSIS — Z3009 Encounter for other general counseling and advice on contraception: Secondary | ICD-10-CM | POA: Diagnosis not present

## 2022-02-10 DIAGNOSIS — F325 Major depressive disorder, single episode, in full remission: Secondary | ICD-10-CM

## 2022-02-10 DIAGNOSIS — M5441 Lumbago with sciatica, right side: Secondary | ICD-10-CM

## 2022-02-10 NOTE — Assessment & Plan Note (Addendum)
Patient went for abortion a few weeks ago. We discussed forms of birth control . She is not interested in oral medication at this time. She has been considering tubal ligation.

## 2022-02-10 NOTE — Assessment & Plan Note (Signed)
Patient has chronic low back pain which started after having her twins.The pain comes on after standing for a long period of time and relieved with rest. No prior imaging. Patient has BMI of 43. No red flag symptoms on exam. Discussed long term goal of weight loss.

## 2022-02-10 NOTE — Assessment & Plan Note (Signed)
Patient has BMI of 43. Review of chart shows gradual weight gain over last 10 years. She gain weight with her twins and has not gotten back to pre pregnancy weight.   Plan: Recent Hemoglobin A1c was nl, TSH nl.  - Amb Referral To Provider Referral Exercise Program (P.R.E.P) - Counseled on cutting out drinks with sugar - Made goal for 5 pound weight loss before next visit and building habits discussed

## 2022-02-10 NOTE — Progress Notes (Signed)
   HPI:Misty Murillo is a 32 y.o. female who presents for follow up of depression and concern for low back pain. For the details of today's visit, please refer to the assessment and plan.  Back Pain (Started when she was pregnant with her twins. Some days are hard to get out of bed. Hurts in hips and sciatica. Hard to stand for extended periods also  )    Physical Exam: Vitals:   02/10/22 0855  BP: 129/82  Pulse: 64  Resp: 16  SpO2: 96%  Weight: 254 lb 12.8 oz (115.6 kg)  Height: '5\' 4"'$  (1.626 m)    Gen: Morbidly Obese, well kept, well appearing  No gross deformity, scoliosis. TTP .  No midline or bony TTP. FROM. Strength LEs 5/5 all muscle groups.   2+ MSRs in patellar and achilles tendons, equal bilaterally. Negative SLRs. Sensation intact to light touch bilaterally. Psych: Mood: Happy, normal affect   Assessment & Plan:   MDD (major depressive disorder) Patient had abortion. Her symptoms improved and she has not been taking Lexapro. She works from home and we discussed ways to find breaks in her day. She will follow up if these symptoms return.   Birth control counseling Patient went for abortion a few weeks ago. We discussed forms of birth control . She is not interested in oral medication at this time. She has been considering tubal ligation.  Chronic low back pain Patient has chronic low back pain which started after having her twins.The pain comes on after standing for a long period of time and relieved with rest. No prior imaging. Patient has BMI of 43. No red flag symptoms on exam. Discussed long term goal of weight loss.   Morbid obesity ([place]) Patient has BMI of 43. Review of chart shows gradual weight gain over last 10 years. She gain weight with her twins and has not gotten back to pre pregnancy weight.   Plan: Recent Hemoglobin A1c was nl, TSH nl.  - Amb Referral To Provider Referral Exercise Program (P.R.E.P) - Counseled on cutting out drinks with sugar -  Made goal for 5 pound weight loss before next visit and building habits discussed    Lorene Dy, MD

## 2022-02-10 NOTE — Patient Instructions (Addendum)
Thank you for trusting me with your care. To recap, today we discussed the following:   - Amb Referral To Provider Referral Exercise Program (P.R.E.P)

## 2022-02-10 NOTE — Assessment & Plan Note (Signed)
Patient had abortion. Her symptoms improved and she has not been taking Lexapro. She works from home and we discussed ways to find breaks in her day. She will follow up if these symptoms return.

## 2022-02-22 ENCOUNTER — Encounter (HOSPITAL_COMMUNITY): Payer: Self-pay

## 2022-02-22 ENCOUNTER — Other Ambulatory Visit: Payer: Self-pay

## 2022-02-22 ENCOUNTER — Emergency Department (HOSPITAL_COMMUNITY)
Admission: EM | Admit: 2022-02-22 | Discharge: 2022-02-22 | Disposition: A | Discharge status: Home / Self Care | Payer: BC Managed Care – PPO | Attending: Emergency Medicine | Admitting: Emergency Medicine

## 2022-02-22 DIAGNOSIS — N939 Abnormal uterine and vaginal bleeding, unspecified: Secondary | ICD-10-CM | POA: Insufficient documentation

## 2022-02-22 LAB — CBC WITH DIFFERENTIAL/PLATELET
Abs Immature Granulocytes: 0.02 10*3/uL (ref 0.00–0.07)
Basophils Absolute: 0 10*3/uL (ref 0.0–0.1)
Basophils Relative: 0 %
Eosinophils Absolute: 0 10*3/uL (ref 0.0–0.5)
Eosinophils Relative: 0 %
HCT: 31.5 % — ABNORMAL LOW (ref 36.0–46.0)
Hemoglobin: 10.7 g/dL — ABNORMAL LOW (ref 12.0–15.0)
Immature Granulocytes: 0 %
Lymphocytes Relative: 27 %
Lymphs Abs: 2.4 10*3/uL (ref 0.7–4.0)
MCH: 27.4 pg (ref 26.0–34.0)
MCHC: 34 g/dL (ref 30.0–36.0)
MCV: 80.8 fL (ref 80.0–100.0)
Monocytes Absolute: 0.4 10*3/uL (ref 0.1–1.0)
Monocytes Relative: 4 %
Neutro Abs: 6 10*3/uL (ref 1.7–7.7)
Neutrophils Relative %: 69 %
Platelets: 350 10*3/uL (ref 150–400)
RBC: 3.9 MIL/uL (ref 3.87–5.11)
RDW: 14.5 % (ref 11.5–15.5)
WBC: 8.9 10*3/uL (ref 4.0–10.5)
nRBC: 0 % (ref 0.0–0.2)

## 2022-02-22 LAB — TYPE AND SCREEN
ABO/RH(D): O POS
Antibody Screen: NEGATIVE

## 2022-02-22 LAB — BASIC METABOLIC PANEL
Anion gap: 9 (ref 5–15)
BUN: 13 mg/dL (ref 6–20)
CO2: 23 mmol/L (ref 22–32)
Calcium: 9.1 mg/dL (ref 8.9–10.3)
Chloride: 104 mmol/L (ref 98–111)
Creatinine, Ser: 0.86 mg/dL (ref 0.44–1.00)
GFR, Estimated: >60 mL/min (ref >60)
Glucose, Bld: 103 mg/dL — ABNORMAL HIGH (ref 70–99)
Potassium: 3.6 mmol/L (ref 3.5–5.1)
Sodium: 136 mmol/L (ref 135–145)

## 2022-02-22 LAB — URINALYSIS, ROUTINE W REFLEX MICROSCOPIC
Bacteria, UA: NONE SEEN
Bilirubin Urine: NEGATIVE
Glucose, UA: NEGATIVE mg/dL
Ketones, ur: NEGATIVE mg/dL
Leukocytes,Ua: NEGATIVE
Nitrite: NEGATIVE
Protein, ur: NEGATIVE mg/dL
RBC / HPF: >50 RBC/hpf (ref 0–5)
Specific Gravity, Urine: 1.01 (ref 1.005–1.030)
pH: 6 (ref 5.0–8.0)

## 2022-02-22 LAB — HCG, QUANTITATIVE, PREGNANCY: hCG, Beta Chain, Quant, S: 9 m[IU]/mL — ABNORMAL HIGH (ref <5)

## 2022-02-22 LAB — CBG MONITORING, ED: Glucose-Capillary: 108 mg/dL — ABNORMAL HIGH (ref 70–99)

## 2022-02-22 MED ORDER — MISOPROSTOL 200 MCG PO TABS
1000.0000 ug | ORAL_TABLET | Freq: Once | ORAL | Status: AC
  Administered 2022-02-22: 1000 ug via VAGINAL
  Filled 2022-02-22: qty 5

## 2022-02-22 NOTE — ED Triage Notes (Signed)
Pt arrived via POV c/o vaginal bleeding, and reports passing large clots following recent D&C on 01/25/22. Pt reports saturating hygiene pads apprx every 30-60 mins. Pt reports Hx of anemia.

## 2022-02-22 NOTE — Discharge Instructions (Signed)
Please schedule follow-up with OB/GYN for an office visit.  If you develop fever, increasing abdominal/pelvic pain, persistent bleeding especially if you are weak, dizzy or short of breath, return to the ER immediately.

## 2022-02-22 NOTE — ED Provider Notes (Signed)
[facility] Provider Note   CSN: [id number] Arrival date & time: 02/22/22  3545     History  Chief Complaint  Patient presents with   Vaginal Bleeding    Misty Murillo is a 32 y.o. female.  Patient presents to the emergency department for vaginal bleeding.  Patient reports recent pregnancy, had elective abortion on January 6.  She reports light bleeding after the procedure for a couple of days then heavy bleeding.  Bleeding then started to taper off but never stopped.  In the last couple of days bleeding has become heavy again now passing clots.  She has a slight amount of cramping.  No fever.  No dizziness or passing out.       Home Medications Prior to Admission medications   Medication Sig Start Date End Date Taking? Authorizing Provider  Cholecalciferol (VITAMIN D3) 25 MCG (1000 UT) CAPS Take 1 capsule (1,000 Units total) by mouth daily. 01/09/22   Lyndal Pulley, MD      Allergies    Patient has no known allergies.    Review of Systems   Review of Systems  Physical Exam Updated Vital Signs BP 110/64   Pulse 75   Temp 98.6 F (37 C) (Oral)   Resp 18   Ht '5\' 4"'$  (1.626 m)   Wt 115 kg   LMP  (LMP Unknown)   SpO2 99%   BMI 43.52 kg/m  Physical Exam Vitals and nursing note reviewed. Exam conducted with a chaperone present.  Constitutional:      General: She is not in acute distress.    Appearance: She is well-developed.  HENT:     Head: Normocephalic and atraumatic.     Mouth/Throat:     Mouth: Mucous membranes are moist.  Eyes:     General: Vision grossly intact. Gaze aligned appropriately.     Extraocular Movements: Extraocular movements intact.     Conjunctiva/sclera: Conjunctivae normal.  Cardiovascular:     Rate and Rhythm: Normal rate and regular rhythm.     Pulses: Normal pulses.     Heart sounds: Normal heart sounds, S1 normal and S2 normal. No murmur heard.    No friction rub. No gallop.  Pulmonary:      Effort: Pulmonary effort is normal. No respiratory distress.     Breath sounds: Normal breath sounds.  Abdominal:     General: Bowel sounds are normal.     Palpations: Abdomen is soft.     Tenderness: There is no abdominal tenderness. There is no guarding or rebound.     Hernia: No hernia is present.  Genitourinary:    Cervix: Normal.     Uterus: Normal.      Adnexa:        Right: No mass.         Left: No mass.    Musculoskeletal:        General: No swelling.     Cervical back: Full passive range of motion without pain, normal range of motion and neck supple. No spinous process tenderness or muscular tenderness. Normal range of motion.     Right lower leg: No edema.     Left lower leg: No edema.  Skin:    General: Skin is warm and dry.     Capillary Refill: Capillary refill takes less than 2 seconds.     Findings: No ecchymosis, erythema, rash or wound.  Neurological:     General: No focal  deficit present.     Mental Status: She is alert and oriented to person, place, and time.     GCS: GCS eye subscore is 4. GCS verbal subscore is 5. GCS motor subscore is 6.     Cranial Nerves: Cranial nerves 2-12 are intact.     Sensory: Sensation is intact.     Motor: Motor function is intact.     Coordination: Coordination is intact.  Psychiatric:        Attention and Perception: Attention normal.        Mood and Affect: Mood normal.        Speech: Speech normal.        Behavior: Behavior normal.     ED Results / Procedures / Treatments   Labs (all labs ordered are listed, but only abnormal results are displayed) Labs Reviewed  URINALYSIS, ROUTINE W REFLEX MICROSCOPIC - Abnormal; Notable for the following components:      Result Value   APPearance HAZY (*)    Hgb urine dipstick LARGE (*)    All other components within normal limits  CBC WITH DIFFERENTIAL/PLATELET - Abnormal; Notable for the following components:   Hemoglobin 10.7 (*)    HCT 31.5 (*)    All other components  within normal limits  BASIC METABOLIC PANEL - Abnormal; Notable for the following components:   Glucose, Bld 103 (*)    All other components within normal limits  HCG, QUANTITATIVE, PREGNANCY - Abnormal; Notable for the following components:   hCG, Beta Chain, Quant, S 9 (*)    All other components within normal limits  CBG MONITORING, ED - Abnormal; Notable for the following components:   Glucose-Capillary 108 (*)    All other components within normal limits  TYPE AND SCREEN    EKG None  Radiology No results found.  Procedures Procedures    Medications Ordered in ED Medications  misoprostol (CYTOTEC) tablet 1,000 mcg (has no administration in time range)    ED Course/ Medical Decision Making/ A&P                             Medical Decision Making Amount and/or Complexity of Data Reviewed Labs: ordered.  Risk Prescription drug management.   Presents to the emergency department for evaluation of vaginal bleeding in the setting of recent elective abortion.  Retained products was considered.  Normal bleeding with resumption of menstrual cycle was also considered.  Patient's total hCG is only 9 which would presumably be quite low for retained products.  Likewise she is afebrile, normal white blood cell count, no tenderness of the duress on bimanual exam.  Cervix closed without any discharge.  Patient's hemoglobin is 10.7, would not require transfusion.  She appears well.  Gust with Dr. Kennon Rounds.  Specifically, discussed whether or not ultrasound (which is not currently available at this ED) is helpful or not.  Dr. Kennon Rounds recommended Cytotec 1000 mcg per vagina and follow-up in the office.  Patient given infectious and anemia symptom instructions for return to the ED.        Final Clinical Impression(s) / ED Diagnoses Final diagnoses:  Vaginal bleeding    Rx / DC Orders ED Discharge Orders     None         Oluwaseun Cremer, Gwenyth Allegra, MD 02/22/22 [phone]

## 2022-02-24 ENCOUNTER — Telehealth: Payer: Self-pay

## 2022-02-24 NOTE — Telephone Encounter (Signed)
Transition Care Management Unsuccessful Follow-up Telephone Call  Date of discharge and from where:  02/22/22 [facility] ER - abnormal uterine and vaginal bleeding  Attempts:  1st Attempt  Reason for unsuccessful TCM follow-up call:  Left voice message

## 2022-02-26 NOTE — Telephone Encounter (Signed)
Transition Care Management Follow-up Telephone Call Date of discharge and from where: 02/22/22 [facility] How have you been since you were released from the hospital? Better - bleeding resolved Any questions or concerns? No  Items Reviewed: Did the pt receive and understand the discharge instructions provided? Yes  Medications obtained and verified? Yes  Other? No  Any new allergies since your discharge? No  Dietary orders reviewed? Yes Do you have support at home? Yes   Functional Questionnaire: (I = Independent and D = Dependent) ADLs: I  Bathing/Dressing- I  Meal Prep- I  Eating- I  Maintaining continence- I  Transferring/Ambulation- I  Managing Meds- I  Follow up appointments reviewed:  PCP Hospital f/u appt confirmed?  NO, DECLINED - NO NEED FOR F/U   Specialist Hospital f/u appt confirmed? No   Are transportation arrangements needed? No  If their condition worsens, is the pt aware to call PCP or go to the Emergency Dept.? Yes Was the patient provided with contact information for the PCP's office or ED? Yes Was to pt encouraged to call back with questions or concerns? Yes

## 2022-04-13 ENCOUNTER — Other Ambulatory Visit: Payer: Self-pay

## 2022-04-13 ENCOUNTER — Encounter (HOSPITAL_COMMUNITY): Payer: Self-pay

## 2022-04-13 ENCOUNTER — Emergency Department (HOSPITAL_COMMUNITY)
Admission: EM | Admit: 2022-04-13 | Discharge: 2022-04-13 | Disposition: A | Discharge status: Home / Self Care | Payer: BC Managed Care – PPO | Attending: Emergency Medicine | Admitting: Emergency Medicine

## 2022-04-13 DIAGNOSIS — K6289 Other specified diseases of anus and rectum: Secondary | ICD-10-CM | POA: Diagnosis not present

## 2022-04-13 DIAGNOSIS — F17200 Nicotine dependence, unspecified, uncomplicated: Secondary | ICD-10-CM | POA: Diagnosis not present

## 2022-04-13 DIAGNOSIS — R102 Pelvic and perineal pain: Secondary | ICD-10-CM | POA: Diagnosis present

## 2022-04-13 MED ORDER — DOXYCYCLINE HYCLATE 100 MG PO CAPS: 100.0000 mg | ORAL_CAPSULE | Freq: Two times a day (BID) | ORAL | 0 refills | Status: DC

## 2022-04-13 NOTE — ED Triage Notes (Signed)
Pt reports frequent BMs and then developed rectal pain 2 days ago. Pt reports that she feels her rectum is swollen.

## 2022-04-13 NOTE — Discharge Instructions (Addendum)
Take the antibiotic doxycycline as directed.  Make an appointment to follow-up with [facility].  Could be early perianal abscess.  Return for new or worse symptoms.  Follow-up with [facility] because of the that vaginal entrance discomfort as well.  Currently no signs of abscess in that area.  Also recommend sitz bath's.  Soak your bottom in warm water for 20 minutes every day.  Recommend extra strength Tylenol and Motrin as needed for pain.

## 2022-04-13 NOTE — ED Provider Notes (Signed)
[facility] Provider Note   CSN: [id number] Arrival date & time: 04/13/22  2141     History  Chief Complaint  Patient presents with   Rectal Pain    Misty Murillo is a 32 y.o. female.  Patient with a complaint of rectal pain for 2 days.  Kind of at the anal verge.  Patient denies any bleeding.  Denies any swelling there.  Past medical history significant for Graves' disease obesity PID pregnancy-induced hypertension patient's had her gallbladder removed.  Also history of migraines.  Patient everyday smoker.       Home Medications Prior to Admission medications   Medication Sig Start Date End Date Taking? Authorizing Provider  Cholecalciferol (VITAMIN D3) 25 MCG (1000 UT) CAPS Take 1 capsule (1,000 Units total) by mouth daily. 01/09/22   Lyndal Pulley, MD  doxycycline (VIBRAMYCIN) 100 MG capsule Take 1 capsule (100 mg total) by mouth 2 (two) times daily. 04/13/22  Yes Fredia Sorrow, MD      Allergies    Patient has no known allergies.    Review of Systems   Review of Systems  Constitutional:  Negative for chills and fever.  HENT:  Negative for ear pain and sore throat.   Eyes:  Negative for pain and visual disturbance.  Respiratory:  Negative for cough and shortness of breath.   Cardiovascular:  Negative for chest pain and palpitations.  Gastrointestinal:  Positive for rectal pain. Negative for abdominal pain and vomiting.  Genitourinary:  Negative for dysuria and hematuria.  Musculoskeletal:  Negative for arthralgias and back pain.  Skin:  Negative for color change and rash.  Neurological:  Negative for seizures and syncope.  All other systems reviewed and are negative.   Physical Exam Updated Vital Signs BP 121/73 (BP Location: Right Arm)   Pulse 70   Temp 98.6 F (37 C)   Resp 20   Ht 1.626 m (5\' 4" )   Wt 104.3 kg   LMP 03/18/2022   SpO2 100%   BMI 39.48 kg/m  Physical Exam Vitals and nursing note reviewed.   Constitutional:      General: She is not in acute distress.    Appearance: She is well-developed.  HENT:     Head: Normocephalic and atraumatic.  Eyes:     Conjunctiva/sclera: Conjunctivae normal.  Cardiovascular:     Rate and Rhythm: Normal rate and regular rhythm.     Heart sounds: No murmur heard. Pulmonary:     Effort: Pulmonary effort is normal. No respiratory distress.     Breath sounds: Normal breath sounds.  Abdominal:     Palpations: Abdomen is soft.     Tenderness: There is no abdominal tenderness.  Genitourinary:    Comments: Rectum normal.  No anal fissure.  No prolapsed internal hemorrhoids no external hemorrhoids.  But anteriorly on the anal verge there are some tenderness but no evidence of any fluctuance or swelling.  A little bit of excoriation in that area. Musculoskeletal:        General: No swelling.     Cervical back: Neck supple.  Skin:    General: Skin is warm and dry.     Capillary Refill: Capillary refill takes less than 2 seconds.  Neurological:     Mental Status: She is alert.  Psychiatric:        Mood and Affect: Mood normal.     ED Results / Procedures / Treatments   Labs (all labs  ordered are listed, but only abnormal results are displayed) Labs Reviewed - No data to display  EKG None  Radiology No results found.  Procedures Procedures    Medications Ordered in ED Medications - No data to display  ED Course/ Medical Decision Making/ A&P                             Medical Decision Making Risk Prescription drug management.   Clinically could be early perianal abscess.  Incidentally patient also brought up to the point of at the vaginal introitus on the left side much more anteriorly than the anal area with some discomfort there.  No swelling noted.  Nothing consistent with a Bartholin gland cyst.  No discharge there is some vaginal bleeding.  Will have patient follow-up with OB/GYN for this.  Will have patient do sitz bath's  will start on antibiotics and also have OB/GYN follow-up the anal area.  Patient will return for any new or worse symptoms.  Also no evidence of any pilonidal cyst or abscess.   Final Clinical Impression(s) / ED Diagnoses Final diagnoses:  Perianal pain    Rx / DC Orders ED Discharge Orders          Ordered    doxycycline (VIBRAMYCIN) 100 MG capsule  2 times daily        04/13/22 2314              Fredia Sorrow, MD 04/13/22 2319

## 2022-04-15 ENCOUNTER — Other Ambulatory Visit: Payer: Self-pay

## 2022-04-15 ENCOUNTER — Ambulatory Visit (INDEPENDENT_AMBULATORY_CARE_PROVIDER_SITE_OTHER): Payer: BC Managed Care – PPO | Admitting: Family Medicine

## 2022-04-15 ENCOUNTER — Encounter: Payer: Self-pay | Admitting: Family Medicine

## 2022-04-15 ENCOUNTER — Other Ambulatory Visit: Payer: Self-pay | Admitting: Family Medicine

## 2022-04-15 VITALS — Ht 63.0 in | Wt 250.0 lb

## 2022-04-15 DIAGNOSIS — K6289 Other specified diseases of anus and rectum: Secondary | ICD-10-CM | POA: Diagnosis not present

## 2022-04-15 MED ORDER — DOXYCYCLINE CALCIUM 50 MG/5ML PO SYRP: 100.0000 mg | ORAL_SOLUTION | Freq: Two times a day (BID) | ORAL | 0 refills | Status: AC

## 2022-04-15 MED ORDER — DOXYCYCLINE CALCIUM 50 MG/5ML PO SYRP: 100.0000 mg | ORAL_SOLUTION | Freq: Two times a day (BID) | ORAL | 0 refills | Status: DC

## 2022-04-15 MED ORDER — PREDNISONE 20 MG PO TABS: 20.0000 mg | ORAL_TABLET | Freq: Every day | ORAL | 0 refills | Status: DC

## 2022-04-15 MED ORDER — PREDNISONE 5 MG/5ML PO SOLN: 10.0000 mg | Freq: Every day | ORAL | 0 refills | Status: DC

## 2022-04-15 MED ORDER — PREDNISONE 5 MG/5ML PO SOLN: 10.0000 mg | Freq: Every day | ORAL | 0 refills | Status: AC

## 2022-04-15 NOTE — Progress Notes (Signed)
   Virtual Visit via Video Note  I connected with Misty Murillo on 04/15/22 at 11:40 AM EDT by a video enabled telemedicine application and verified that I am speaking with the correct person using two identifiers.  Patient Location: Home Provider Location: Office/Clinic  I discussed the limitations, risks, security, and privacy concerns of performing an evaluation and management service by video and the availability of in person appointments. I also discussed with the patient that there may be a patient responsible charge related to this service. The patient expressed understanding and agreed to proceed.  Subjective: PCP: Alvira Monday, FNP  Chief Complaint  Patient presents with   Follow-up    Patient was seen in ED on 3/24 for perianal pain. Was given doxy. States the area between her anus and vagina feels full, swollen, painful and itchy.    HPI  Follow up ER visit  Patient was seen in ER for Perianal pain on 04/13/22. She was treated for early perianal abscess. Treatment for this included Doxycycline 100 mg capsule two times daily for 7 days. She reports inadequate compliance with treatment due to her not taking her pills. She reports this condition is Unchanged.  -----------------------------------------------------------------------------------------  ROS: Per HPI  Current Outpatient Medications:    Cholecalciferol (VITAMIN D3) 25 MCG (1000 UT) CAPS, Take 1 capsule (1,000 Units total) by mouth daily., Disp: 60 capsule, Rfl: 0   doxycycline (VIBRAMYCIN) 50 MG/5ML SYRP, Take 10 mLs (100 mg total) by mouth 2 (two) times daily for 7 days., Disp: 473 mL, Rfl: 0   predniSONE 5 MG/5ML solution, Take 10 mLs (10 mg total) by mouth daily with breakfast for 5 days., Disp: 50 mL, Rfl: 0  Observations/Objective: Today's Vitals   04/15/22 1130  Weight: 250 lb (113.4 kg)  Height: 5\' 3"  (1.6 m)   Physical Exam Telehealth visit   Assessment and Plan: Rectal pain -     Doxycycline  Calcium; Take 10 mLs (100 mg total) by mouth 2 (two) times daily for 7 days.  Dispense: 473 mL; Refill: 0 -     predniSONE; Take 10 mLs (10 mg total) by mouth daily with breakfast for 5 days.  Dispense: 50 mL; Refill: 0  Pain of perianal area Assessment & Plan: Patient reported she can not take pills Prescribed patient Doxycycline syrup format x 7 days and Prednisone 10 mg x 5 days  Advise patient to follow up with GYN Advise patient to apply cold , wet cloth or ice pack to the painful area, wear loose-fitting , cotton clothing.     Follow Up Instructions: No follow-ups on file.   I discussed the assessment and treatment plan with the patient. The patient was provided an opportunity to ask questions, and all were answered. The patient agreed with the plan and demonstrated an understanding of the instructions.   The patient was advised to call back or seek an in-person evaluation if the symptoms worsen or if the condition fails to improve as anticipated.  The above assessment and management plan was discussed with the patient. The patient verbalized understanding of and has agreed to the management plan.   Renard Hamper [name], FNP

## 2022-04-15 NOTE — Assessment & Plan Note (Addendum)
Patient reported she can not take pills Prescribed patient Doxycycline syrup format x 7 days and Prednisone 10 mg x 5 days  Advise patient to follow up with GYN Advise patient to apply cold , wet cloth or ice pack to the painful area, wear loose-fitting , cotton clothing.

## 2022-04-16 ENCOUNTER — Other Ambulatory Visit: Payer: Self-pay | Admitting: Family Medicine

## 2022-04-16 DIAGNOSIS — K6289 Other specified diseases of anus and rectum: Secondary | ICD-10-CM

## 2022-04-22 ENCOUNTER — Ambulatory Visit (INDEPENDENT_AMBULATORY_CARE_PROVIDER_SITE_OTHER): Payer: BC Managed Care – PPO | Admitting: Adult Health

## 2022-04-22 ENCOUNTER — Encounter: Payer: Self-pay | Admitting: Adult Health

## 2022-04-22 ENCOUNTER — Encounter: Payer: Self-pay | Admitting: Family Medicine

## 2022-04-22 ENCOUNTER — Telehealth (INDEPENDENT_AMBULATORY_CARE_PROVIDER_SITE_OTHER): Payer: BC Managed Care – PPO | Admitting: Family Medicine

## 2022-04-22 ENCOUNTER — Other Ambulatory Visit (HOSPITAL_COMMUNITY)
Admission: RE | Admit: 2022-04-22 | Discharge: 2022-04-22 | Disposition: A | Discharge status: Home / Self Care | Payer: BC Managed Care – PPO | Source: Ambulatory Visit | Attending: Adult Health | Admitting: Adult Health

## 2022-04-22 VITALS — BP 123/76 | HR 78 | Ht 63.0 in | Wt 250.0 lb

## 2022-04-22 DIAGNOSIS — R102 Pelvic and perineal pain unspecified side: Secondary | ICD-10-CM

## 2022-04-22 DIAGNOSIS — R1032 Left lower quadrant pain: Secondary | ICD-10-CM

## 2022-04-22 DIAGNOSIS — Z113 Encounter for screening for infections with a predominantly sexual mode of transmission: Secondary | ICD-10-CM

## 2022-04-22 DIAGNOSIS — R350 Frequency of micturition: Secondary | ICD-10-CM | POA: Diagnosis not present

## 2022-04-22 DIAGNOSIS — Z30011 Encounter for initial prescription of contraceptive pills: Secondary | ICD-10-CM

## 2022-04-22 DIAGNOSIS — G629 Polyneuropathy, unspecified: Secondary | ICD-10-CM | POA: Diagnosis not present

## 2022-04-22 DIAGNOSIS — Z3202 Encounter for pregnancy test, result negative: Secondary | ICD-10-CM | POA: Diagnosis not present

## 2022-04-22 LAB — POCT URINALYSIS DIPSTICK
Glucose, UA: NEGATIVE
Ketones, UA: NEGATIVE
Leukocytes, UA: NEGATIVE
Nitrite, UA: NEGATIVE
Protein, UA: NEGATIVE

## 2022-04-22 LAB — POCT URINE PREGNANCY: Preg Test, Ur: NEGATIVE

## 2022-04-22 MED ORDER — NORETHIN ACE-ETH ESTRAD-FE 1-20 MG-MCG PO TABS: 1.0000 | ORAL_TABLET | Freq: Every day | ORAL | 11 refills | Status: DC

## 2022-04-22 MED ORDER — GABAPENTIN 100 MG PO CAPS
100.0000 mg | ORAL_CAPSULE | Freq: Three times a day (TID) | ORAL | 3 refills | Status: DC
Start: 2022-04-22 — End: 2022-06-11

## 2022-04-22 NOTE — Progress Notes (Signed)
   Virtual Visit via Video Note  I connected with Misty Murillo on 04/22/22 at  9:00 AM EDT by a video enabled telemedicine application and verified that I am speaking with the correct person using two identifiers.  Patient Location: Home Provider Location: Office/Clinic  I discussed the limitations, risks, security, and privacy concerns of performing an evaluation and management service by video and the availability of in person appointments. I also discussed with the patient that there may be a patient responsible charge related to this service. The patient expressed understanding and agreed to proceed.  Subjective: PCP: Alvira Monday, FNP  Chief Complaint  Patient presents with   Vaginal Pain    Pt reports vaginal discomfort feels a heavy sensation inside as if something wants to come out.    Leg Pain    Pt reports leg pain since 04/15/2022 or longer, pain radiating down to her feet. Burning sensation at times.    HPI  The patient is seen today with complaints of vaginal discomfort, stating that she feels something pushing down and wants to come out of her.  She reports following up April 24, 2022.  Neuropathy: She reports working from home with prolonged sitting.  She complains of pins-and-needles, tingling sensation that radiated from her thighs to her feet.  She reports numbness when standing erect.  Denies lower back pain, bladder and bowel incontinence    ROS: Per HPI  Current Outpatient Medications:    Cholecalciferol (VITAMIN D3) 25 MCG (1000 UT) CAPS, Take 1 capsule (1,000 Units total) by mouth daily., Disp: 60 capsule, Rfl: 0   doxycycline (VIBRAMYCIN) 50 MG/5ML SYRP, Take 10 mLs (100 mg total) by mouth 2 (two) times daily for 7 days., Disp: 473 mL, Rfl: 0   gabapentin (NEURONTIN) 100 MG capsule, Take 1 capsule (100 mg total) by mouth 3 (three) times daily. (Patient not taking: Reported on 04/22/2022), Disp: 90 capsule, Rfl: 3   norethindrone-ethinyl estradiol-FE (LOESTRIN  FE) 1-20 MG-MCG tablet, Take 1 tablet by mouth daily., Disp: 28 tablet, Rfl: 11   Prenatal Vit-Fe Fumarate-FA (PRENATAL VITAMIN PO), Take by mouth., Disp: , Rfl:   Observations/Objective: There were no vitals filed for this visit. Physical Exam Patient is well-developed, well-nourished in no acute distress.  Resting comfortably at home.  Head is normocephalic, atraumatic.  No labored breathing.  Speech is clear and coherent with logical content.  Patient is alert and oriented at baseline.   Assessment and Plan: Neuropathy -     Gabapentin; Take 1 capsule (100 mg total) by mouth 3 (three) times daily. (Patient not taking: Reported on 04/22/2022)  Dispense: 90 capsule; Refill: 3    Follow Up Instructions: No follow-ups on file.   I discussed the assessment and treatment plan with the patient. The patient was provided an opportunity to ask questions, and all were answered. The patient agreed with the plan and demonstrated an understanding of the instructions.   The patient was advised to call back or seek an in-person evaluation if the symptoms worsen or if the condition fails to improve as anticipated.  The above assessment and management plan was discussed with the patient. The patient verbalized understanding of and has agreed to the management plan.   Alvira Monday, FNP

## 2022-04-22 NOTE — Progress Notes (Signed)
  Subjective:     Patient ID: Misty Murillo, female   DOB: 01-04-1991, 32 y.o.   MRN: [id number]  HPI Media is a 32 year old black female, single, [id number] in complaining of pelvic pressure(like something falling out when peeing), and pain LLQ at times and has urinary frequency. She wants to discuss birth control too. She requests STD testing.  She has history of hives and had some when took lo Loestrin but had other things going on then too.   Last pap negative HPV and NILM 12/28/20.  PCP is Alvira Monday, NP.   Review of Systems +pelvic pressure(like something falling out when peeing), and pain LLQ at times and has urinary frequency.    Denies problems with bowel movements or intercourse.  Reviewed past medical,surgical, social and family history. Reviewed medications and allergies.  Objective:   Physical Exam BP 123/76 (BP Location: Left Arm, Patient Position: Sitting, Cuff Size: Large)   Pulse 78   Ht 5\' 3"  (1.6 m)   Wt 250 lb (113.4 kg)   LMP 04/13/2022   Breastfeeding No   BMI 44.29 kg/m  UPT is negative. Urine dipstick trace blood. Skin warm and dry.Pelvic: external genitalia is normal in appearance no lesions, vagina: scant white discharge without odor,no uterine descensus, or cystocele noted,urethra has no lesions or masses noted, cervix:smooth and bulbous, uterus: normal size, shape and contour, non tender, no masses felt, adnexa: no masses or tenderness noted. Bladder is non tender and no masses felt. CV swab obtained.     Upstream - 04/22/22 1512       Pregnancy Intention Screening   Does the patient want to become pregnant in the next year? No    Does the patient's partner want to become pregnant in the next year? Yes    Would the patient like to discuss contraceptive options today? Yes      Contraception Wrap Up   Current Method No Method - Other Reason    End Method Oral Contraceptive    Contraception Counseling Provided Yes    How was the end contraceptive method  provided? Prescription             Assessment:     1. Pregnancy examination or test, negative result - POCT urine pregnancy  2. Urinary frequency UA C&S sent to rule out UTI - POCT Urinalysis Dipstick - Urine Culture - Urinalysis, Routine w reflex microscopic  3. LLQ pain Scheduled pelvic US at [facility] 05/01/22 at 2:30 pm to assess uterus and ovaries - US PELVIC COMPLETE WITH TRANSVAGINAL; Future  4. Pelvic pressure in female Scheduled pelvic US at [facility] 05/01/22 at 2:30 pm - US PELVIC COMPLETE WITH TRANSVAGINAL; Future Will talk when results back  5. Screen for STD (sexually transmitted disease) CV sent for GC/CHL,trich,BV and yeast  - Cervicovaginal ancillary only( [place])  6. Encounter for initial prescription of contraceptive pills Will start on Junel 1-20 today Use condoms for 1 pack Meds ordered this encounter  Medications   norethindrone-ethinyl estradiol-FE (LOESTRIN FE) 1-20 MG-MCG tablet    Sig: Take 1 tablet by mouth daily.    Dispense:  28 tablet    Refill:  11    Order Specific Question:   Supervising Provider    Answer:   Florian Buff [[id number]]       Plan:     Follow up in 3 months for ROS

## 2022-04-23 LAB — URINALYSIS, ROUTINE W REFLEX MICROSCOPIC
Bilirubin, UA: NEGATIVE
Glucose, UA: NEGATIVE
Ketones, UA: NEGATIVE
Leukocytes,UA: NEGATIVE
Nitrite, UA: NEGATIVE
Protein,UA: NEGATIVE
RBC, UA: NEGATIVE
Specific Gravity, UA: 1.013 (ref 1.005–1.030)
Urobilinogen, Ur: 0.2 mg/dL (ref 0.2–1.0)
pH, UA: 6 (ref 5.0–7.5)

## 2022-04-24 ENCOUNTER — Ambulatory Visit: Payer: BC Managed Care – PPO | Admitting: Adult Health

## 2022-04-24 LAB — CERVICOVAGINAL ANCILLARY ONLY
Bacterial Vaginitis (gardnerella): POSITIVE — AB
Candida Glabrata: NEGATIVE
Candida Vaginitis: NEGATIVE
Chlamydia: NEGATIVE
Comment: NEGATIVE
Comment: NEGATIVE
Comment: NEGATIVE
Comment: NEGATIVE
Comment: NEGATIVE
Comment: NORMAL
Neisseria Gonorrhea: NEGATIVE
Trichomonas: NEGATIVE

## 2022-04-24 LAB — URINE CULTURE: Organism ID, Bacteria: NO GROWTH

## 2022-04-25 ENCOUNTER — Other Ambulatory Visit: Payer: Self-pay | Admitting: Adult Health

## 2022-04-25 MED ORDER — METRONIDAZOLE 500 MG PO TABS: 500.0000 mg | ORAL_TABLET | Freq: Two times a day (BID) | ORAL | 0 refills | Status: DC

## 2022-05-01 ENCOUNTER — Ambulatory Visit (HOSPITAL_COMMUNITY): Admission: RE | Admit: 2022-05-01 | Payer: BC Managed Care – PPO | Source: Ambulatory Visit

## 2022-05-12 ENCOUNTER — Ambulatory Visit: Payer: BC Managed Care – PPO | Admitting: Family Medicine

## 2022-05-13 ENCOUNTER — Encounter: Payer: Self-pay | Admitting: Family Medicine

## 2022-06-11 ENCOUNTER — Ambulatory Visit (INDEPENDENT_AMBULATORY_CARE_PROVIDER_SITE_OTHER): Payer: Self-pay | Admitting: *Deleted

## 2022-06-11 VITALS — BP 124/82 | HR 78 | Ht 64.0 in | Wt 258.0 lb

## 2022-06-11 DIAGNOSIS — N926 Irregular menstruation, unspecified: Secondary | ICD-10-CM

## 2022-06-11 DIAGNOSIS — Z3201 Encounter for pregnancy test, result positive: Secondary | ICD-10-CM

## 2022-06-11 LAB — POCT URINE PREGNANCY: Preg Test, Ur: POSITIVE — AB

## 2022-06-11 NOTE — Progress Notes (Signed)
   NURSE VISIT- PREGNANCY CONFIRMATION   SUBJECTIVE:  Misty Murillo is a 32 y.o. [phone] female at [redacted]w[redacted]d by certain LMP of Patient's last menstrual period was 05/08/2022. Here for pregnancy confirmation.  Home pregnancy test: positive x 2   She reports cramping.  She is not taking prenatal vitamins.    OBJECTIVE:  BP 124/82 (BP Location: Left Arm, Patient Position: Sitting, Cuff Size: Large)   Pulse 78   Ht 5\' 4"  (1.626 m)   Wt 258 lb (117 kg)   LMP 05/08/2022   BMI 44.29 kg/m   Appears well, in no apparent distress  Results for orders placed or performed in visit on 06/11/22 (from the past 24 hour(s))  POCT urine pregnancy   Collection Time: 06/11/22 10:40 AM  Result Value Ref Range   Preg Test, Ur Positive (A) Negative    ASSESSMENT: Positive pregnancy test, [redacted]w[redacted]d by LMP    PLAN: Schedule for dating ultrasound in 3 weeks Prenatal vitamins: plans to begin OTC ASAP   Nausea medicines: not currently needed   OB packet given: Yes  Jobe Marker  06/11/2022 10:43 AM

## 2022-07-01 ENCOUNTER — Other Ambulatory Visit: Payer: Medicaid Other

## 2022-07-01 ENCOUNTER — Ambulatory Visit: Payer: Medicaid Other | Admitting: Advanced Practice Midwife

## 2022-07-22 ENCOUNTER — Ambulatory Visit: Payer: Medicaid Other | Admitting: Adult Health

## 2023-01-16 ENCOUNTER — Other Ambulatory Visit (HOSPITAL_COMMUNITY)
Admission: RE | Admit: 2023-01-16 | Discharge: 2023-01-16 | Disposition: A | Discharge status: Home / Self Care | Payer: Managed Care, Other (non HMO) | Source: Ambulatory Visit | Attending: Obstetrics & Gynecology | Admitting: Obstetrics & Gynecology

## 2023-01-16 ENCOUNTER — Other Ambulatory Visit: Payer: Managed Care, Other (non HMO)

## 2023-01-16 DIAGNOSIS — N898 Other specified noninflammatory disorders of vagina: Secondary | ICD-10-CM | POA: Diagnosis not present

## 2023-01-16 NOTE — Progress Notes (Signed)
   NURSE VISIT- VAGINITIS  SUBJECTIVE:  Misty Murillo is a 32 y.o. [id number] GYN patientfemale here for a vaginal swab for vaginitis screening.  She reports the following symptoms: local irritation for 3 days. Denies abnormal vaginal bleeding, significant pelvic pain, fever, or UTI symptoms.  OBJECTIVE:  LMP 01/11/2023   Breastfeeding No   Appears well, in no apparent distress  ASSESSMENT: Vaginal swab for vaginitis screening  PLAN: Self-collected vaginal probe for Gonorrhea, Chlamydia, Trichomonas, Bacterial Vaginosis, Yeast sent to lab Treatment: to be determined once results are received Follow-up as needed if symptoms persist/worsen, or new symptoms develop  Jobe Marker  01/16/2023 11:25 AM

## 2023-01-19 LAB — CERVICOVAGINAL ANCILLARY ONLY
Bacterial Vaginitis (gardnerella): POSITIVE — AB
Candida Glabrata: NEGATIVE
Candida Vaginitis: POSITIVE — AB
Chlamydia: NEGATIVE
Comment: NEGATIVE
Comment: NEGATIVE
Comment: NEGATIVE
Comment: NEGATIVE
Comment: NEGATIVE
Comment: NORMAL
Neisseria Gonorrhea: NEGATIVE
Trichomonas: NEGATIVE

## 2023-01-20 ENCOUNTER — Other Ambulatory Visit: Payer: Self-pay | Admitting: Adult Health

## 2023-01-20 MED ORDER — METRONIDAZOLE 500 MG PO TABS: 500.0000 mg | ORAL_TABLET | Freq: Two times a day (BID) | ORAL | 0 refills | Status: DC

## 2023-01-20 MED ORDER — FLUCONAZOLE 150 MG PO TABS
ORAL_TABLET | ORAL | 1 refills | Status: DC
Start: 2023-01-20 — End: 2023-05-15

## 2023-01-20 NOTE — Progress Notes (Signed)
+  BV and yeast on vaginal swab will rx flagyl and diflucan, no sex or alcohol, while taking meds

## 2023-02-13 ENCOUNTER — Ambulatory Visit: Payer: Managed Care, Other (non HMO) | Admitting: Adult Health

## 2023-04-02 ENCOUNTER — Telehealth: Payer: Self-pay | Admitting: Family Medicine

## 2023-04-02 NOTE — Telephone Encounter (Signed)
 Copied from CRM [id number]. Topic: Clinical - Medical Advice >> Apr 02, 2023  9:28 AM Marland Kitchen D wrote: Patient wants to know if she can get labs done before appointment. She states she is concerned about her current energy levels and want to get her thyroids  checked due to history with graves disease. Please give patient a call back.

## 2023-04-03 ENCOUNTER — Ambulatory Visit: Payer: Self-pay | Admitting: Family Medicine

## 2023-04-03 ENCOUNTER — Ambulatory Visit: Payer: Managed Care, Other (non HMO) | Admitting: Adult Health

## 2023-04-03 ENCOUNTER — Encounter: Payer: Managed Care, Other (non HMO) | Admitting: Physician Assistant

## 2023-04-03 NOTE — Telephone Encounter (Signed)
 Chief Complaint: Prickling sensation Symptoms: Back pain Frequency: Intermittent Pertinent Negatives: Patient denies chest pain, difficulty breathing, headache  Disposition: [x] Appointment(In office)  Additional Notes: Pt is wanting to schedule an appt to establish care as a new patient at [facility]. Pt states she has "no new symptoms or anything of concern." Pt states she thinks the back pain is due to her weight gain. Pt thinks the pricking sensation is from her vitamin D deficiency. It started back one week ago, but has had this issue in past. Pt states she has not been taking her supplements (vitamin, potassium). This RN tried to ask more questions but pt states frustration that she just wants to schedule an appt. This RN scheduled pt for first available appt at [facility] for 5/15. This RN let pt know to go to urgent care if any of her symptoms continue/worsen. Pt states understanding.    Copied from CRM [id number]. Topic: Clinical - Red Word Triage >> Apr 03, 2023 12:49 PM Chantha C wrote: Red Word that prompted transfer to Nurse Triage: Patient is trying to set up an appointment as NPT ESC in [facility]. Patient has thyroid issues history of Grave's disease, overly exhausting, headaches, and hasn't checked levels in long time. Burning lower back pain when walking, standing or moving. Pricking sensation- cold, burning, like pins and painful in the arms radiating shoulders, and legs. Patient denies vision issues, nor shortness of breath. Please advise [phone]. Reason for Disposition  [1] Numbness or tingling in one or both hands AND [2] is a chronic symptom (recurrent or ongoing AND present > 4 weeks)  Answer Assessment - Initial Assessment Questions 1. SYMPTOM: "What is the main symptom you are concerned about?" (e.g., weakness, numbness)     Prickling sensation in both arms, switches 2. ONSET: "When did this start?" (minutes, hours, days; while sleeping)     One week  ago but has had issue before  Protocols used: Neurologic Deficit-A-AH

## 2023-04-04 ENCOUNTER — Other Ambulatory Visit: Payer: Self-pay | Admitting: Family Medicine

## 2023-04-04 DIAGNOSIS — E7849 Other hyperlipidemia: Secondary | ICD-10-CM

## 2023-04-04 DIAGNOSIS — E559 Vitamin D deficiency, unspecified: Secondary | ICD-10-CM

## 2023-04-04 DIAGNOSIS — R7301 Impaired fasting glucose: Secondary | ICD-10-CM

## 2023-04-04 DIAGNOSIS — E038 Other specified hypothyroidism: Secondary | ICD-10-CM

## 2023-04-04 NOTE — Telephone Encounter (Signed)
 Please inform the patient that orders have been placed, and they should return for fasting labs.

## 2023-04-06 NOTE — Telephone Encounter (Signed)
 Left detailed vm

## 2023-05-15 ENCOUNTER — Ambulatory Visit: Admitting: Adult Health

## 2023-05-15 ENCOUNTER — Other Ambulatory Visit (HOSPITAL_COMMUNITY)
Admission: RE | Admit: 2023-05-15 | Discharge: 2023-05-15 | Disposition: A | Discharge status: Home / Self Care | Source: Ambulatory Visit | Attending: Adult Health | Admitting: Adult Health

## 2023-05-15 ENCOUNTER — Encounter: Payer: Self-pay | Admitting: Adult Health

## 2023-05-15 VITALS — BP 126/89 | HR 74 | Ht 63.0 in | Wt 275.0 lb

## 2023-05-15 DIAGNOSIS — R635 Abnormal weight gain: Secondary | ICD-10-CM | POA: Diagnosis not present

## 2023-05-15 DIAGNOSIS — Z1331 Encounter for screening for depression: Secondary | ICD-10-CM | POA: Diagnosis not present

## 2023-05-15 DIAGNOSIS — Z01419 Encounter for gynecological examination (general) (routine) without abnormal findings: Secondary | ICD-10-CM | POA: Diagnosis present

## 2023-05-15 NOTE — Progress Notes (Signed)
 Patient ID: CACIE GASKINS, female   DOB: 1990/09/17, 33 y.o.   MRN: [id number] History of Present Illness:  Misty Murillo is a 33 year old black female, single, [id number] in for a well woman gyn exam and pap. She feels like she is gaining weight even when eating healthy, used to have Graves Disease and is off meds, seeing PCP soon, will ask for labs.  PCP is Gloria Zarwolo NP  Current Medications, Allergies, Past Medical History, Past Surgical History, Family History and Social History were reviewed in [employer] record.     Review of Systems: Patient denies any headaches, hearing loss, fatigue, blurred vision, shortness of breath, chest pain, abdominal pain, problems with bowel movements, urination, or intercourse. No joint pain or mood swings.  Gaining weight   Physical Exam:BP 126/89 (BP Location: Right Arm, Patient Position: Sitting, Cuff Size: Large)   Pulse 74   Ht 5\' 3"  (1.6 m)   Wt 275 lb (124.7 kg)   LMP 04/29/2023 (Exact Date)   BMI 48.71 kg/m   General:  Well developed, well nourished, no acute distress Skin:  Warm and dry Neck:  Midline trachea, normal thyroid , good ROM, no lymphadenopathy Lungs; Clear to auscultation bilaterally Breast:  No dominant palpable mass, retraction, or nipple discharge Cardiovascular: Regular rate and rhythm Abdomen:  Soft, non tender, no hepatosplenomegaly Pelvic:  External genitalia is normal in appearance, no lesions.  The vagina is normal in appearance. Urethra has no lesions or masses. The cervix is smooth, pap with GC/CHL and HR HPV genotyping performed.  Uterus is felt to be normal size, shape, and contour.  No adnexal masses or tenderness noted.Bladder is non tender, no masses felt. Extremities/musculoskeletal:  No swelling or varicosities noted, no clubbing or cyanosis Psych:  No mood changes, alert and cooperative,seems happy AA is 2 Fall risk is low    05/15/2023   10:58 AM 04/22/2022    9:17 AM 02/10/2022    8:58 AM   Depression screen PHQ 2/9  Decreased Interest 2 0 2  Down, Depressed, Hopeless 1 0 0  PHQ - 2 Score 3 0 2  Altered sleeping 0 0 0  Tired, decreased energy 3 0 2  Change in appetite 0 0 0  Feeling bad or failure about yourself  1 0 0  Trouble concentrating 0 0 0  Moving slowly or fidgety/restless 0 0 0  Suicidal thoughts 0 0 0  PHQ-9 Score 7 0 4  Difficult doing work/chores  Not difficult at all Not difficult at all   Declines meds, no depressed just life    05/15/2023   11:02 AM 04/22/2022    9:17 AM 01/06/2022    9:05 AM 04/10/2020   10:21 AM  GAD 7 : Generalized Anxiety Score  Nervous, Anxious, on Edge 1 0 1 0  Control/stop worrying 1 0 0 0  Worry too much - different things 1 0 1 0  Trouble relaxing 0 0 1 0  Restless 0 0 0 0  Easily annoyed or irritable 2 0 0 0  Afraid - awful might happen 0 0 0 0  Total GAD 7 Score 5 0 3 0  Anxiety Difficulty  Not difficult at all Not difficult at all       Upstream - 05/15/23 1057       Pregnancy Intention Screening   Does the patient want to become pregnant in the next year? No    Does the patient's partner want to become pregnant in  the next year? No    Would the patient like to discuss contraceptive options today? No      Contraception Wrap Up   Current Method Female Condom    End Method Female Condom             Examination chaperoned by Alphonso Aschoff LPN  Impression and plan: 1. Encounter for gynecological examination with Papanicolaou smear of cervix (Primary) Pap sent Pap in 3 years if normal Physical in 1 year Get labs with PCP  - Cytology - PAP( [place])  2. Weight gain Talk with PCP, about getting labs esp thyroid 

## 2023-05-20 LAB — CYTOLOGY - PAP
Adequacy: ABSENT
Chlamydia: NEGATIVE
Comment: NEGATIVE
Comment: NEGATIVE
Comment: NORMAL
Diagnosis: NEGATIVE
High risk HPV: NEGATIVE
Neisseria Gonorrhea: NEGATIVE

## 2023-05-28 ENCOUNTER — Encounter: Payer: Self-pay | Admitting: Family Medicine

## 2023-06-04 ENCOUNTER — Encounter: Admitting: Family Medicine

## 2023-07-02 ENCOUNTER — Encounter: Payer: Self-pay | Admitting: Family Medicine

## 2023-07-02 ENCOUNTER — Ambulatory Visit (INDEPENDENT_AMBULATORY_CARE_PROVIDER_SITE_OTHER): Admitting: Family Medicine

## 2023-07-02 VITALS — BP 129/75 | HR 74 | Resp 16 | Ht 63.0 in | Wt 274.1 lb

## 2023-07-02 DIAGNOSIS — R7301 Impaired fasting glucose: Secondary | ICD-10-CM

## 2023-07-02 DIAGNOSIS — Z0001 Encounter for general adult medical examination with abnormal findings: Secondary | ICD-10-CM

## 2023-07-02 DIAGNOSIS — E038 Other specified hypothyroidism: Secondary | ICD-10-CM

## 2023-07-02 DIAGNOSIS — E559 Vitamin D deficiency, unspecified: Secondary | ICD-10-CM | POA: Diagnosis not present

## 2023-07-02 DIAGNOSIS — E0500 Thyrotoxicosis with diffuse goiter without thyrotoxic crisis or storm: Secondary | ICD-10-CM

## 2023-07-02 DIAGNOSIS — E7849 Other hyperlipidemia: Secondary | ICD-10-CM

## 2023-07-02 NOTE — Progress Notes (Signed)
 Complete physical exam  Patient: Misty Murillo   DOB: 07-Nov-1990   32 y.o. Female  MRN: [id number]  Subjective:    Chief Complaint  Patient presents with   Annual Exam    Having increased fatigue and wants her thyroid  checked. They have been abnormal in the past     Misty Murillo is a 33 y.o. female who presents today for a complete physical exam. She reports consuming a low carbs diet. Gym/ health club routine includes cardio, light weights, treadmill, and walking on track . She generally feels well. She reports sleeping well. She does have additional problems to discuss today.    Most recent fall risk assessment:    07/02/2023    1:38 PM  Fall Risk   Falls in the past year? 0  Number falls in past yr: 0  Injury with Fall? 0  Follow up Falls evaluation completed     Most recent depression screenings:    07/02/2023    1:38 PM 05/15/2023   10:58 AM  PHQ 2/9 Scores  PHQ - 2 Score 1 3  PHQ- 9 Score  7    Dental: No current dental problems and Last dental visit: 07/02/2023  Patient Active Problem List   Diagnosis Date Noted   Pregnancy examination or test, negative result 04/22/2022   Urinary frequency 04/22/2022   LLQ pain 04/22/2022   Pelvic pressure in female 04/22/2022   Pain of perianal area 04/15/2022   Morbid obesity (HCC) 02/10/2022   Chronic low back pain 02/10/2022   Amenorrhea 01/07/2022   MDD (major depressive disorder) 01/07/2022   Encounter for initial prescription of contraceptive pills 12/28/2020   Screen for STD (sexually transmitted disease) 12/28/2020   Encounter for general adult medical examination with abnormal findings 12/28/2020   Irregular periods 12/28/2020   History of gestational hypertension 06/06/2020   Sepsis (HCC) 02/14/2020   Fibroid    Graves' disease 08/03/2018   Vitamin D  deficiency 03/08/2018   Chronic urticaria 07/11/2017   Angioedema 07/11/2017   Sickle cell trait (HCC) 05/26/2017   Liver mass, left lobe 05/18/2017   Past  Medical History:  Diagnosis Date   Anxiety    Depression    denies   Fibroid    Graves disease    Headache    Liver mass    Migraines    PID (acute pelvic inflammatory disease) 07/2019   Pregnancy induced hypertension    Past Surgical History:  Procedure Laterality Date   CHOLECYSTECTOMY     DILATION AND CURETTAGE OF UTERUS  01/2022   INDUCED ABORTION     RESECTION LIVER     hemangioma   Social History   Tobacco Use   Smoking status: Former    Current packs/day: 0.00    Types: Cigars, Cigarettes    Quit date: 01/27/2018    Years since quitting: 5.4   Smokeless tobacco: Never  Vaping Use   Vaping status: Never Used  Substance Use Topics   Alcohol use: Yes    Comment: occasionally   Drug use: Not Currently    Types: Marijuana    Comment: last used mid October   Social History   Socioeconomic History   Marital status: Single    Spouse name: Not on file   Number of children: Not on file   Years of education: Not on file   Highest education level: Not on file  Occupational History   Not on file  Tobacco Use   Smoking  status: Former    Current packs/day: 0.00    Types: Cigars, Cigarettes    Quit date: 01/27/2018    Years since quitting: 5.4   Smokeless tobacco: Never  Vaping Use   Vaping status: Never Used  Substance and Sexual Activity   Alcohol use: Yes    Comment: occasionally   Drug use: Not Currently    Types: Marijuana    Comment: last used mid October   Sexual activity: Yes    Birth control/protection: None, Condom  Other Topics Concern   Not on file  Social History Narrative   Works from [employer]   Social Drivers of [employer] Strain: Low Risk  (05/15/2023)   Overall Financial Resource Strain (CARDIA)    Difficulty of Paying Living Expenses: Not hard at all  Food Insecurity: No Food Insecurity (05/15/2023)   Hunger Vital Sign    Worried About Running Out of Food in the Last Year: Never true    Ran Out of Food in the Last  Year: Never true  Transportation Needs: No Transportation Needs (05/15/2023)   PRAPARE - Administrator, Civil Service (Medical): No    Lack of Transportation (Non-Medical): No  Physical Activity: Inactive (05/15/2023)   Exercise Vital Sign    Days of Exercise per Week: 0 days    Minutes of Exercise per Session: 0 min  Stress: Stress Concern Present (05/15/2023)   [employer] of Occupational Health - Occupational Stress Questionnaire    Feeling of Stress : To some extent  Social Connections: Socially Isolated (05/15/2023)   Social Connection and Isolation Panel    Frequency of Communication with Friends and Family: More than three times a week    Frequency of Social Gatherings with Friends and Family: Once a week    Attends Religious Services: Never    Database administrator or Organizations: No    Attends Banker Meetings: Never    Marital Status: Never married  Intimate Partner Violence: Not At Risk (05/15/2023)   Humiliation, Afraid, Rape, and Kick questionnaire    Fear of Current or Ex-Partner: No    Emotionally Abused: No    Physically Abused: No    Sexually Abused: No   Family Status  Relation Name Status   PGF  Deceased   PGM  Deceased   MGM  Deceased   MGF  Deceased   Father  Alive   Mother  Audiological scientist  (Not Specified)   Son  Alive   Son  Alive   Son  Alive   Other  (Not Specified)  No partnership data on file   Family History  Problem Relation Age of Onset   Cancer Maternal Grandmother    Cancer Maternal Grandfather    Migraines Father    Hypertension Father    Diabetes Mother    Hyperthyroidism Mother    Hyperthyroidism Brother    Hypertension Brother    Cancer Other    No Known Allergies    Patient Care Team: Starlyn Droge, FNP as PCP - General (Family Medicine)   Outpatient Medications Prior to Visit  Medication Sig   Prenatal Vit-Fe Fumarate-FA (PRENATAL VITAMIN PO) Take by mouth.   VITAMIN D  PO Take by mouth.    No facility-administered medications prior to visit.    Review of Systems  Constitutional:  Positive for malaise/fatigue. Negative for chills and fever.  HENT:  Negative for congestion and sinus pain.   Eyes:  Negative for pain, discharge and redness.  Respiratory:  Negative for cough, sputum production and shortness of breath.   Cardiovascular:  Negative for chest pain, palpitations, claudication and leg swelling.  Gastrointestinal:  Negative for diarrhea, heartburn and nausea.  Genitourinary:  Negative for flank pain and frequency.  Musculoskeletal:  Negative for back pain and joint pain.  Skin:  Negative for itching.  Neurological:  Negative for dizziness, seizures and headaches.  Endo/Heme/Allergies:  Negative for environmental allergies.  Psychiatric/Behavioral:  Negative for memory loss. The patient does not have insomnia.        Objective:    BP 129/75   Pulse 74   Resp 16   Ht 5' 3 (1.6 m)   Wt 274 lb 1.9 oz (124.3 kg)   SpO2 100%   BMI 48.56 kg/m  BP Readings from Last 3 Encounters:  07/02/23 129/75  05/15/23 126/89  06/11/22 124/82   Wt Readings from Last 3 Encounters:  07/02/23 274 lb 1.9 oz (124.3 kg)  05/15/23 275 lb (124.7 kg)  06/11/22 258 lb (117 kg)      Physical Exam HENT:     Head: Normocephalic.     Left Ear: External ear normal.     Mouth/Throat:     Mouth: Mucous membranes are moist.   Eyes:     Extraocular Movements: Extraocular movements intact.     Pupils: Pupils are equal, round, and reactive to light.    Cardiovascular:     Rate and Rhythm: Normal rate and regular rhythm.     Heart sounds: No murmur heard. Pulmonary:     Effort: Pulmonary effort is normal.     Breath sounds: Normal breath sounds.  Abdominal:     Palpations: Abdomen is soft.     Tenderness: There is no right CVA tenderness or left CVA tenderness.   Musculoskeletal:     Right lower leg: No edema.     Left lower leg: No edema.   Skin:    Findings: No  lesion or rash.   Neurological:     Mental Status: She is alert and oriented to person, place, and time.     GCS: GCS eye subscore is 4. GCS verbal subscore is 5. GCS motor subscore is 6.     Cranial Nerves: No facial asymmetry.     Sensory: No sensory deficit.     Motor: No weakness.     Coordination: Coordination normal. Finger-Nose-Finger Test normal.     Gait: Gait normal.   Psychiatric:        Judgment: Judgment normal.     No results found for any visits on 07/02/23. Last CBC Lab Results  Component Value Date   WBC 8.9 02/22/2022   HGB 10.7 (L) 02/22/2022   HCT 31.5 (L) 02/22/2022   MCV 80.8 02/22/2022   MCH 27.4 02/22/2022   RDW 14.5 02/22/2022   PLT 350 02/22/2022   Last metabolic panel Lab Results  Component Value Date   GLUCOSE 103 (H) 02/22/2022   NA 136 02/22/2022   K 3.6 02/22/2022   CL 104 02/22/2022   CO2 23 02/22/2022   BUN 13 02/22/2022   CREATININE 0.86 02/22/2022   GFRNONAA >60 02/22/2022   CALCIUM  9.1 02/22/2022   PROT 7.1 01/06/2022   ALBUMIN 4.4 01/06/2022   LABGLOB 2.7 01/06/2022   AGRATIO 1.6 01/06/2022   BILITOT 0.4 01/06/2022   ALKPHOS 78 01/06/2022   AST 10 01/06/2022   ALT 13 01/06/2022   ANIONGAP 9 02/22/2022  Last lipids Lab Results  Component Value Date   CHOL 200 (H) 01/06/2022   HDL 44 01/06/2022   LDLCALC 138 (H) 01/06/2022   TRIG 99 01/06/2022   CHOLHDL 4.5 (H) 01/06/2022   Last hemoglobin A1c Lab Results  Component Value Date   HGBA1C 5.6 01/06/2022   Last thyroid  functions Lab Results  Component Value Date   TSH 0.972 12/04/2021   T4TOTAL 15.9 (H) 12/19/2019   Last vitamin D  Lab Results  Component Value Date   VD25OH 14.1 (L) 01/06/2022   Last vitamin B12 and Folate No results found for: VITAMINB12, FOLATE    Assessment & Plan:    Routine Health Maintenance and Physical Exam  Immunization History  Administered Date(s) Administered   PPD Test 08/16/2014, 04/23/2015   Tdap 05/08/2020     Health Maintenance  Topic Date Due   HPV VACCINES (1 - 3-dose series) Never done   COVID-19 Vaccine (1 - 2024-25 season) Never done   INFLUENZA VACCINE  08/21/2023   Cervical Cancer Screening (HPV/Pap Cotest)  05/14/2028   DTaP/Tdap/Td (2 - Td or Tdap) 05/09/2030   Hepatitis C Screening  Completed   HIV Screening  Completed   Meningococcal B Vaccine  Aged Out    Discussed health benefits of physical activity, and encouraged her to engage in regular exercise appropriate for her age and condition.  Encounter for general adult medical examination with abnormal findings Assessment & Plan: Physical exam as documented Discussed heart-healthy diet  Encouraged to Exercise: If you are able: 30 -60 minutes a day ,4 days a week, or 150 minutes a week. The longer the better. Combine stretch, strength, and aerobic activities Encourage to eat whole Food, Plant Predominant Nutrition is highly recommended: Eat Plenty of vegetables, Mushrooms, fruits, Legumes, Whole Grains, Nuts, seeds in lieu of processed meats, processed snacks/pastries red meat, poultry, eggs.  Will f/u in 1 year for CPE      Graves' disease Assessment & Plan: Pending TSH and free T4   IFG (impaired fasting glucose) -     Hemoglobin A1c  Vitamin D  deficiency -     VITAMIN D  25 Hydroxy (Vit-D Deficiency, Fractures)  TSH (thyroid -stimulating hormone deficiency) -     TSH + free T4  Other hyperlipidemia -     Lipid panel -     CMP14+EGFR -     CBC with Differential/Platelet    Return in about 6 months (around 01/01/2024).   Note: This chart has been completed using Engineer, civil (consulting) software, and while attempts have been made to ensure accuracy, certain words and phrases may not be transcribed as intended.    Leonce Bale, FNP

## 2023-07-02 NOTE — Assessment & Plan Note (Signed)

## 2023-07-02 NOTE — Assessment & Plan Note (Signed)
 Pending TSH and free T4

## 2023-07-02 NOTE — Patient Instructions (Addendum)
 I appreciate the opportunity to provide care to you today!    Follow up:  6 months  Labs: please stop by the lab during the week to get your blood drawn (CBC, CMP, TSH, Lipid profile, HgA1c, Vit D)  For a Healthier YOU, I Recommend: Reducing your intake of sugar, sodium, carbohydrates, and saturated fats. Increasing your fiber intake by incorporating more whole grains, fruits, and vegetables into your meals. Setting healthy goals with a focus on lowering your consumption of carbs, sugar, and unhealthy fats. Adding variety to your diet by including a wide range of fruits and vegetables. Cutting back on soda and limiting processed foods as much as possible. Staying active: In addition to taking your weight loss medication, aim for at least 150 minutes of moderate-intensity physical activity each week for optimal results.     Please continue to a heart-healthy diet and increase your physical activities. Try to exercise for at least five days a week.    It was a pleasure to see you and I look forward to continuing to work together on your health and well-being. Please do not hesitate to call the office if you need care or have questions about your care.  In case of emergency, please visit the Emergency Department for urgent care, or contact our clinic at [phone] to schedule an appointment. We're here to help you!   Have a wonderful day and week. With Gratitude, Adelle Zachar MSN, FNP-BC

## 2023-07-03 ENCOUNTER — Encounter: Payer: Self-pay | Admitting: Family Medicine

## 2023-07-08 ENCOUNTER — Telehealth: Payer: Self-pay

## 2023-07-08 DIAGNOSIS — E038 Other specified hypothyroidism: Secondary | ICD-10-CM | POA: Diagnosis not present

## 2023-07-08 DIAGNOSIS — E559 Vitamin D deficiency, unspecified: Secondary | ICD-10-CM | POA: Diagnosis not present

## 2023-07-08 DIAGNOSIS — E7849 Other hyperlipidemia: Secondary | ICD-10-CM | POA: Diagnosis not present

## 2023-07-08 DIAGNOSIS — R7301 Impaired fasting glucose: Secondary | ICD-10-CM | POA: Diagnosis not present

## 2023-07-08 NOTE — Telephone Encounter (Signed)
 Awaiting Art Bigness response

## 2023-07-08 NOTE — Telephone Encounter (Signed)
 Copied from CRM [id number]. Topic: Clinical - Medical Advice >> Jul 08, 2023 11:47 AM Ivette P wrote: Reason for CRM: pt called in because she sent a message through mychart on 06/13 and wanted to know about her weight loss medication options.    Pt said she has yet to receive a response and would like a follow up.    Pls follow up with pt

## 2023-07-09 LAB — CMP14+EGFR
ALT: 17 IU/L (ref 0–32)
AST: 19 IU/L (ref 0–40)
Albumin: 4.2 g/dL (ref 3.9–4.9)
Alkaline Phosphatase: 88 IU/L (ref 44–121)
BUN/Creatinine Ratio: 8 — ABNORMAL LOW (ref 9–23)
BUN: 6 mg/dL (ref 6–20)
Bilirubin Total: 0.4 mg/dL (ref 0.0–1.2)
CO2: 19 mmol/L — ABNORMAL LOW (ref 20–29)
Calcium: 9.2 mg/dL (ref 8.7–10.2)
Chloride: 102 mmol/L (ref 96–106)
Creatinine, Ser: 0.79 mg/dL (ref 0.57–1.00)
Globulin, Total: 3 g/dL (ref 1.5–4.5)
Glucose: 86 mg/dL (ref 70–99)
Potassium: 3.8 mmol/L (ref 3.5–5.2)
Sodium: 139 mmol/L (ref 134–144)
Total Protein: 7.2 g/dL (ref 6.0–8.5)
eGFR: 102 mL/min/{1.73_m2} (ref >59)

## 2023-07-09 LAB — CBC WITH DIFFERENTIAL/PLATELET
Basophils Absolute: 0 10*3/uL (ref 0.0–0.2)
Basos: 0 %
EOS (ABSOLUTE): 0 10*3/uL (ref 0.0–0.4)
Eos: 0 %
Hematocrit: 39.1 % (ref 34.0–46.6)
Hemoglobin: 12.6 g/dL (ref 11.1–15.9)
Immature Grans (Abs): 0 10*3/uL (ref 0.0–0.1)
Immature Granulocytes: 0 %
Lymphocytes Absolute: 1.5 10*3/uL (ref 0.7–3.1)
Lymphs: 24 %
MCH: 26.9 pg (ref 26.6–33.0)
MCHC: 32.2 g/dL (ref 31.5–35.7)
MCV: 84 fL (ref 79–97)
Monocytes Absolute: 0.4 10*3/uL (ref 0.1–0.9)
Monocytes: 6 %
Neutrophils Absolute: 4.3 10*3/uL (ref 1.4–7.0)
Neutrophils: 70 %
Platelets: 334 10*3/uL (ref 150–450)
RBC: 4.68 x10E6/uL (ref 3.77–5.28)
RDW: 15 % (ref 11.7–15.4)
WBC: 6.2 10*3/uL (ref 3.4–10.8)

## 2023-07-09 LAB — VITAMIN D 25 HYDROXY (VIT D DEFICIENCY, FRACTURES): Vit D, 25-Hydroxy: 31 ng/mL (ref 30.0–100.0)

## 2023-07-09 LAB — LIPID PANEL
Chol/HDL Ratio: 5.2 ratio — ABNORMAL HIGH (ref 0.0–4.4)
Cholesterol, Total: 196 mg/dL (ref 100–199)
HDL: 38 mg/dL — ABNORMAL LOW (ref >39)
LDL Chol Calc (NIH): 138 mg/dL — ABNORMAL HIGH (ref 0–99)
Triglycerides: 109 mg/dL (ref 0–149)
VLDL Cholesterol Cal: 20 mg/dL (ref 5–40)

## 2023-07-09 LAB — TSH+FREE T4
Free T4: 1.21 ng/dL (ref 0.82–1.77)
TSH: 1.16 u[IU]/mL (ref 0.450–4.500)

## 2023-07-09 LAB — HEMOGLOBIN A1C
Est. average glucose Bld gHb Est-mCnc: 111 mg/dL
Hgb A1c MFr Bld: 5.5 % (ref 4.8–5.6)

## 2023-07-24 ENCOUNTER — Ambulatory Visit: Payer: Self-pay | Admitting: Family Medicine

## 2023-08-31 ENCOUNTER — Ambulatory Visit: Payer: Self-pay | Admitting: *Deleted

## 2023-08-31 ENCOUNTER — Ambulatory Visit (INDEPENDENT_AMBULATORY_CARE_PROVIDER_SITE_OTHER)

## 2023-08-31 VITALS — BP 138/80 | HR 63 | Ht 63.0 in | Wt 272.0 lb

## 2023-08-31 DIAGNOSIS — H8110 Benign paroxysmal vertigo, unspecified ear: Secondary | ICD-10-CM

## 2023-08-31 MED ORDER — MECLIZINE HCL 25 MG PO TABS: 25.0000 mg | ORAL_TABLET | Freq: Three times a day (TID) | ORAL | 2 refills | Status: AC | PRN

## 2023-08-31 NOTE — Progress Notes (Signed)
 Established Patient Office Visit  Subjective   Patient ID: EBELYN BOHNET, female    DOB: 03-05-1990  Age: 33 y.o. MRN: [id number]  Chief Complaint  Patient presents with   Dizziness    Dizzy/ spinning sensation since Wednesday, comes and goes     Dizziness This is a new problem. The current episode started in the past 7 days. The problem occurs intermittently. The problem has been unchanged. Associated symptoms include headaches, nausea and vertigo. Pertinent negatives include no chest pain, fever, myalgias, neck pain, sore throat or vomiting. Exacerbated by: posititon change. She has tried lying down and acetaminophen  for the symptoms. The treatment provided mild relief.    Patient Active Problem List   Diagnosis Date Noted   Pregnancy examination or test, negative result 04/22/2022   Urinary frequency 04/22/2022   LLQ pain 04/22/2022   Pelvic pressure in female 04/22/2022   Pain of perianal area 04/15/2022   Morbid obesity (HCC) 02/10/2022   Chronic low back pain 02/10/2022   Amenorrhea 01/07/2022   MDD (major depressive disorder) 01/07/2022   Encounter for initial prescription of contraceptive pills 12/28/2020   Screen for STD (sexually transmitted disease) 12/28/2020   Encounter for general adult medical examination with abnormal findings 12/28/2020   Irregular periods 12/28/2020   History of gestational hypertension 06/06/2020   Sepsis (HCC) 02/14/2020   Fibroid    Graves' disease 08/03/2018   Vitamin D  deficiency 03/08/2018   Chronic urticaria 07/11/2017   Angioedema 07/11/2017   Sickle cell trait (HCC) 05/26/2017   Liver mass, left lobe 05/18/2017      Review of Systems  Constitutional:  Negative for fever.  HENT:  Negative for sore throat.   Cardiovascular:  Negative for chest pain.  Gastrointestinal:  Positive for nausea. Negative for vomiting.  Musculoskeletal:  Negative for myalgias and neck pain.  Neurological:  Positive for dizziness, vertigo and  headaches.      Objective:     BP 138/80   Pulse 63   Ht 5' 3 (1.6 m)   Wt 272 lb (123.4 kg)   SpO2 97%   BMI 48.18 kg/m  BP Readings from Last 3 Encounters:  08/31/23 138/80  07/02/23 129/75  05/15/23 126/89   Wt Readings from Last 3 Encounters:  08/31/23 272 lb (123.4 kg)  07/02/23 274 lb 1.9 oz (124.3 kg)  05/15/23 275 lb (124.7 kg)     Physical Exam Vitals and nursing note reviewed.  Constitutional:      Appearance: Normal appearance. She is obese.  HENT:     Head: Normocephalic.     Right Ear: Tympanic membrane, ear canal and external ear normal.     Left Ear: Tympanic membrane, ear canal and external ear normal.     Nose: Nose normal.     Mouth/Throat:     Mouth: Mucous membranes are moist.     Pharynx: Oropharynx is clear.  Eyes:     Extraocular Movements: Extraocular movements intact.     Pupils: Pupils are equal, round, and reactive to light.  Cardiovascular:     Rate and Rhythm: Normal rate and regular rhythm.  Pulmonary:     Effort: Pulmonary effort is normal.     Breath sounds: Normal breath sounds.  Musculoskeletal:     Cervical back: Normal range of motion and neck supple.  Neurological:     Mental Status: She is alert and oriented to person, place, and time.     Cranial Nerves: No cranial nerve deficit.  Motor: No weakness.     Gait: Gait normal.  Psychiatric:        Mood and Affect: Mood normal.        Thought Content: Thought content normal.      No results found for any visits on 08/31/23.  Last CBC Lab Results  Component Value Date   WBC 6.2 07/08/2023   HGB 12.6 07/08/2023   HCT 39.1 07/08/2023   MCV 84 07/08/2023   MCH 26.9 07/08/2023   RDW 15.0 07/08/2023   PLT 334 07/08/2023   Last metabolic panel Lab Results  Component Value Date   GLUCOSE 86 07/08/2023   NA 139 07/08/2023   K 3.8 07/08/2023   CL 102 07/08/2023   CO2 19 (L) 07/08/2023   BUN 6 07/08/2023   CREATININE 0.79 07/08/2023   EGFR 102 07/08/2023    CALCIUM  9.2 07/08/2023   PROT 7.2 07/08/2023   ALBUMIN 4.2 07/08/2023   LABGLOB 3.0 07/08/2023   AGRATIO 1.6 01/06/2022   BILITOT 0.4 07/08/2023   ALKPHOS 88 07/08/2023   AST 19 07/08/2023   ALT 17 07/08/2023   ANIONGAP 9 02/22/2022   Last lipids Lab Results  Component Value Date   CHOL 196 07/08/2023   HDL 38 (L) 07/08/2023   LDLCALC 138 (H) 07/08/2023   TRIG 109 07/08/2023   CHOLHDL 5.2 (H) 07/08/2023   Last hemoglobin A1c Lab Results  Component Value Date   HGBA1C 5.5 07/08/2023   Last vitamin D  Lab Results  Component Value Date   VD25OH 31.0 07/08/2023      The ASCVD Risk score (Arnett DK, et al., 2019) failed to calculate for the following reasons:   The 2019 ASCVD risk score is only valid for ages 38 to 71    Assessment & Plan:   Problem List Items Addressed This Visit   None Visit Diagnoses       Benign paroxysmal positional vertigo, unspecified laterality    -  Primary   will add meclizine  for benign vertigo and recommend home vestibular exercises.  recommend eye exam soon as well.  F/U if no improvement in 1-2 weeks.   Relevant Medications   meclizine  (ANTIVERT ) 25 MG tablet       No follow-ups on file.    Leita Longs, FNP

## 2023-08-31 NOTE — Patient Instructions (Signed)
 Recommend performing vestibular exercises. You can find these on internet

## 2023-08-31 NOTE — Telephone Encounter (Signed)
 Copied from CRM #[id number]. Topic: Clinical - Red Word Triage >> Aug 31, 2023  9:12 AM Carlatta H wrote: [facility] that prompted transfer to Nurse Triage: Patient has been having constant dizziness that doesn't go away//Dizziness does come with headaches// Reason for Disposition  [1] MODERATE dizziness (e.g., interferes with normal activities) AND [2] has NOT been evaluated by doctor (or NP/PA) for this  (Exception: Dizziness caused by heat exposure, sudden standing, or poor fluid intake.)  Answer Assessment - Initial Assessment Questions 1. DESCRIPTION: Describe your dizziness.     I'm having dizziness that is not going away.   It started a couple of days ago the middle of last week.   I work from home on a computer all day.   The later part of the day it starts.   At first I thought it was the computer.   I just started working out and doing [place].   When I finish it I'm so lightheaded and I have to sit down afterward because things are spinning. An hour after working out I would be ok.   When I sit up or stand up or lay down I feel like I'm spinning.    No new medications.    I'm not on any medications 2. LIGHTHEADED: Do you feel lightheaded? (e.g., somewhat faint, woozy, weak upon standing)     No 3. VERTIGO: Do you feel like either you or the room is spinning or tilting? (i.e., vertigo)     Spinning   I don't feel like I'm going to pass out.   It's just pressure and spinning sensation.  I had migraines too.   I had a bad headache last night.  I took 2 Advil  and used an ice pack on my head and the headache subsided and I was able to sleep.    I woke up and I feel a pressure on my head and I feel like I'm spinning.   My vision is double and blurred.  I've never had this before. I get syncopal episodes.   It's never been a big concern.   But this is the worst and longest it has ever happened.    I'm able to walk around.   I was at [facility] yesterday and was fine.   After I ate dinner I layed  down and then a headache started as a tension pain and then it increased quickly.   I sat up and took the medication before it got too bad.   I was feeling the spinning sensation and then I got nauseas.   I just laid there for 30 min before it resolved and I was able to get to sleep.   4. SEVERITY: How bad is it?  Do you feel like you are going to faint? Can you stand and walk?     I can walk around fine 5. ONSET:  When did the dizziness begin?     Wed of last week and it's getting worse 6. AGGRAVATING FACTORS: Does anything make it worse? (e.g., standing, change in head position)     Moving around and laying down 7. HEART RATE: Can you tell me your heart rate? How many beats in 15 seconds?  (Note: Not all patients can do this.)       Not asked 8. CAUSE: What do you think is causing the dizziness? (e.g., decreased fluids or food, diarrhea, emotional distress, heat exposure, new medicine, sudden standing, vomiting; unknown)     I  don't know      I did start a new diet and working out.    My mother doesn't feel like I'm getting enough food in and especially with working out. 9. RECURRENT SYMPTOM: Have you had dizziness before? If Yes, ask: When was the last time? What happened that time?     No 10. OTHER SYMPTOMS: Do you have any other symptoms? (e.g., fever, chest pain, vomiting, diarrhea, bleeding)       See above 11. PREGNANCY: Is there any chance you are pregnant? When was your last menstrual period?       No   I just completed my period.  Protocols used: Dizziness - Lightheadedness-A-AH FYI Only or Action Required?: FYI only for provider.  Patient was last seen in primary care on 07/02/2023 by Zarwolo, Gloria, FNP.  Called Nurse Triage reporting Dizziness.Spinning sensation with pressure in her head that started last Wed.  Symptoms began a week ago.  Interventions attempted: Rest, hydration, or home remedies.  Symptoms are: gradually worsening.  Triage  Disposition: See Physician Within 24 Hours  Patient/caregiver understands and will follow disposition?: Yes

## 2023-12-30 ENCOUNTER — Ambulatory Visit: Payer: Self-pay

## 2023-12-30 NOTE — Telephone Encounter (Signed)
 Attempt # 1 to reach patient to triage symptoms. Left VM to call back or if symptoms are persisting, please go to the ED.   Copied from CRM #[id number]. Topic: Clinical - Red Word Triage >> Dec 30, 2023 11:43 AM Shanda MATSU wrote: Red Word that prompted transfer to Nurse Triage: Patient reporting for the last couple of days feeling a tingling and numbness in left arm and hand, patient also reporting that as of last night and still into today feeling a heaviness on her chest making it difficult to take a breath. >> Dec 30, 2023  1:19 PM Delon T wrote: Patient called back when she did not hear back from NT, she hung up whiling holding for NT, I called her back but got vm, left message

## 2023-12-30 NOTE — Telephone Encounter (Signed)
 This RN made second attempt to contact pt with no answer. A voicemail was left with call back number provided.

## 2023-12-31 ENCOUNTER — Telehealth: Admitting: Family Medicine

## 2023-12-31 DIAGNOSIS — E782 Mixed hyperlipidemia: Secondary | ICD-10-CM

## 2023-12-31 DIAGNOSIS — R7301 Impaired fasting glucose: Secondary | ICD-10-CM

## 2023-12-31 DIAGNOSIS — E038 Other specified hypothyroidism: Secondary | ICD-10-CM

## 2023-12-31 DIAGNOSIS — E559 Vitamin D deficiency, unspecified: Secondary | ICD-10-CM | POA: Diagnosis not present

## 2023-12-31 NOTE — Progress Notes (Signed)
 Virtual Visit via Video Note  I connected with Misty Murillo on 01/03/2024 at  1:40 PM EST by a video enabled telemedicine application and verified that I am speaking with the correct person using two identifiers.  Patient Location: Home Provider Location: Home Office  I discussed the limitations, risks, security, and privacy concerns of performing an evaluation and management service by video and the availability of in person appointments. I also discussed with the patient that there may be a patient responsible charge related to this service. The patient expressed understanding and agreed to proceed.  Subjective: PCP: Misty Meade PEDLAR, FNP  Chief Complaint  Patient presents with   Medical Management of Chronic Issues    Six month follow up   HPI The patient reports increased fatigue during her menstrual cycle. Her cycles are regular, and she denies clotting or cramping. She states that her period causes the most difficulty due to significant fatigue, lightheadedness, nausea, and overall tiredness. Symptoms occur consistently with each cycle, though she notes no recent changes in flow or pain.   ROS: Per HPI Current Medications[1]  Observations/Objective: There were no vitals filed for this visit. Physical Exam Patient is well-developed, well-nourished in no acute distress.  Resting comfortably at home.  Head is normocephalic, atraumatic.  No labored breathing.  Speech is clear and coherent with logical content.  Patient is alert and oriented at baseline.   Assessment and Plan: IFG (impaired fasting glucose) -     Hemoglobin A1c  Vitamin D  deficiency -     VITAMIN D  25 Hydroxy (Vit-D Deficiency, Fractures)  TSH (thyroid -stimulating hormone deficiency) -     TSH + free T4  Mixed hyperlipidemia -     Lipid panel -     CMP14+EGFR -     CBC with Differential/Platelet   Labs were ordered to assess for anemia, iron  deficiency, thyroid  dysfunction, and other potential  causes of fatigue. The patient was encouraged to maintain adequate hydration and balanced nutrition during menstrual cycles, including increasing intake of iron -rich foods such as leafy greens, beans, red meat, and fortified cereals, with iron  supplementation considered if indicated by lab results. She was advised to monitor her menstrual symptoms and track severity, and we discussed lifestyle strategies such as adequate rest, stress reduction, and moderate physical activity as tolerated. Follow-up will occur after lab results to determine if additional evaluation or treatment is needed. She was instructed to seek care sooner if symptoms worsen, new symptoms develop--such as heavy bleeding, severe dizziness, or fainting--or if fatigue becomes persistent outside of her menstrual cycle.  Follow Up Instructions: No follow-ups on file.   I discussed the assessment and treatment plan with the patient. The patient was provided an opportunity to ask questions, and all were answered. The patient agreed with the plan and demonstrated an understanding of the instructions.   The patient was advised to call back or seek an in-person evaluation if the symptoms worsen or if the condition fails to improve as anticipated.  The above assessment and management plan was discussed with the patient. The patient verbalized understanding of and has agreed to the management plan.   Meade Murillo Edman, FNP     [1]  Current Outpatient Medications:    meclizine  (ANTIVERT ) 25 MG tablet, Take 1 tablet (25 mg total) by mouth 3 (three) times daily as needed for dizziness or nausea., Disp: 30 tablet, Rfl: 2   Prenatal Vit-Fe Fumarate-FA (PRENATAL VITAMIN PO), Take by mouth., Disp: , Rfl:  VITAMIN D  PO, Take by mouth., Disp: , Rfl:

## 2024-02-12 ENCOUNTER — Ambulatory Visit: Payer: Self-pay

## 2024-02-12 ENCOUNTER — Encounter: Payer: Self-pay | Admitting: Family Medicine

## 2024-02-12 NOTE — Telephone Encounter (Signed)
 FYI Only or Action Required?: FYI only for provider: appointment scheduled on 02/18/24.  Patient was last seen in primary care on 12/31/2023 by [name], FNP.  Called Nurse Triage reporting Vaginal Discharge and Vaginal Itching.  Symptoms began several days ago.  Interventions attempted: Nothing.  Symptoms are: unchanged.  Triage Disposition: See PCP When Office is Open (Within 3 Days)  Patient/caregiver understands and will follow disposition?: Yes . Summary: Vaginal irritation & discharge   Reason for Triage: The patient called in stating she had intercourse on Saturday and since then she has noticed irritation and discharge. She is worried about a possible yeast infection or CV. She states she also shaved recently so some irritation maybe coming from that but she is concerned either way and has asked to speak with a nurse. I will transfer her to E2C2 NT.     Reason for Disposition  [1] Vaginal odor (bad smell) AND [2] not improved > 3 days following Care Advice  Answer Assessment - Initial Assessment Questions Patient states that she had sexual intercourse with a new partner on Saturday evening. She started to experience itching and irritation around vaginal opening and inner labia as well as odorous discharge on Sunday. She mentions that she did shave the area on Saturday as well and is not sure if itching may be related to this. Symptoms have not improved and she is concerned about possible yeast infection or bacterial vaginosis. Office visit advised, but soonest available is 02/18/24-scheduled. Patient requesting advice prior to weekend.   1. SYMPTOM: What's the main symptom you're concerned about? (e.g., pain, itching, dryness)     Irritation/itching, odorous discharge  2. LOCATION: Where is the  itching located? (e.g., inside/outside, left/right)     Around vagina and inner labia  3. ONSET: When did the  itching  start?     Sunday  4. PAIN: Is there any pain?  If Yes, ask: How bad is it? (Scale: 1-10; mild, moderate, severe)     No  5. ITCHING: Is there any itching? If Yes, ask: How bad is it? (Scale: 1-10; mild, moderate, severe)     Yes, mild  6. CAUSE: What do you think is causing the discharge? Have you had the same problem before? What happened then?     Concerned it may be yeast infection or BV  7. OTHER SYMPTOMS: Do you have any other symptoms? (e.g., fever, itching, vaginal bleeding, pain with urination, injury to genital area, vaginal foreign body)     Itching/irritation  8. PREGNANCY: Is there any chance you are pregnant? When was your last menstrual period?     Unknown  Protocols used: Vaginal Symptoms-A-AH

## 2024-02-12 NOTE — Telephone Encounter (Signed)
 Noted.

## 2024-02-15 ENCOUNTER — Other Ambulatory Visit: Payer: Self-pay | Admitting: Family Medicine

## 2024-02-15 DIAGNOSIS — A64 Unspecified sexually transmitted disease: Secondary | ICD-10-CM

## 2024-02-15 NOTE — Telephone Encounter (Signed)
 Orders placed for nuswab

## 2024-02-16 ENCOUNTER — Other Ambulatory Visit (HOSPITAL_COMMUNITY)
Admission: RE | Admit: 2024-02-16 | Discharge: 2024-02-16 | Disposition: A | Discharge status: Home / Self Care | Source: Ambulatory Visit | Attending: Obstetrics & Gynecology | Admitting: Obstetrics & Gynecology

## 2024-02-16 ENCOUNTER — Ambulatory Visit: Admitting: *Deleted

## 2024-02-16 DIAGNOSIS — N898 Other specified noninflammatory disorders of vagina: Secondary | ICD-10-CM

## 2024-02-16 DIAGNOSIS — R1030 Lower abdominal pain, unspecified: Secondary | ICD-10-CM | POA: Insufficient documentation

## 2024-02-16 DIAGNOSIS — R319 Hematuria, unspecified: Secondary | ICD-10-CM

## 2024-02-16 DIAGNOSIS — N9489 Other specified conditions associated with female genital organs and menstrual cycle: Secondary | ICD-10-CM | POA: Insufficient documentation

## 2024-02-16 DIAGNOSIS — R350 Frequency of micturition: Secondary | ICD-10-CM

## 2024-02-16 LAB — POCT URINALYSIS DIPSTICK OB
Glucose, UA: NEGATIVE
Ketones, UA: NEGATIVE
Leukocytes, UA: NEGATIVE
Nitrite, UA: NEGATIVE
POC,PROTEIN,UA: NEGATIVE

## 2024-02-16 NOTE — Progress Notes (Signed)
" ° °  NURSE VISIT- VAGINITIS/STD  SUBJECTIVE:  Misty Murillo is a 34 y.o. [id number] GYN patientfemale here for a vaginal swab for vaginitis screening, STD screen.  She reports the following symptoms: local irritation, odor, and urinary symptoms of lower abdominal pain and urinary frequency, vaginal burning for 1 day. Denies abnormal vaginal bleeding, significant pelvic pain, fever, or UTI symptoms.  OBJECTIVE:  There were no vitals taken for this visit.  Appears well, in no apparent distress  ASSESSMENT: Vaginal swab for vaginitis screening Urine dip 1+ blood  PLAN: Self-collected vaginal probe for Gonorrhea, Chlamydia, Trichomonas, Bacterial Vaginosis, Yeast sent to lab Urine sent for culture Treatment: to be determined once results are received Follow-up as needed if symptoms persist/worsen, or new symptoms develop  Rutherford Rover  02/16/2024 12:07 PM  "

## 2024-02-17 ENCOUNTER — Ambulatory Visit: Payer: Self-pay | Admitting: Adult Health

## 2024-02-17 LAB — MICROSCOPIC EXAMINATION
Bacteria, UA: NONE SEEN
Casts: NONE SEEN /LPF
WBC, UA: NONE SEEN /HPF (ref 0–5)

## 2024-02-17 LAB — URINALYSIS, ROUTINE W REFLEX MICROSCOPIC
Bilirubin, UA: NEGATIVE
Glucose, UA: NEGATIVE
Ketones, UA: NEGATIVE
Leukocytes,UA: NEGATIVE
Nitrite, UA: NEGATIVE
Protein,UA: NEGATIVE
Specific Gravity, UA: 1.013 (ref 1.005–1.030)
Urobilinogen, Ur: 0.2 mg/dL (ref 0.2–1.0)
pH, UA: 6 (ref 5.0–7.5)

## 2024-02-17 LAB — CERVICOVAGINAL ANCILLARY ONLY
Bacterial Vaginitis (gardnerella): POSITIVE — AB
Candida Glabrata: NEGATIVE
Candida Vaginitis: NEGATIVE
Chlamydia: NEGATIVE
Comment: NEGATIVE
Comment: NEGATIVE
Comment: NEGATIVE
Comment: NEGATIVE
Comment: NEGATIVE
Comment: NORMAL
Neisseria Gonorrhea: NEGATIVE
Trichomonas: NEGATIVE

## 2024-02-17 MED ORDER — METRONIDAZOLE 500 MG PO TABS: 500.0000 mg | ORAL_TABLET | Freq: Two times a day (BID) | ORAL | 0 refills | Status: AC

## 2024-02-18 ENCOUNTER — Ambulatory Visit: Admitting: Nurse Practitioner

## 2024-02-18 LAB — URINE CULTURE

## 2024-06-22 ENCOUNTER — Ambulatory Visit: Payer: Self-pay | Admitting: Nurse Practitioner
